# Patient Record
Sex: Female | Born: 1955 | Race: White | Hispanic: No | Marital: Married | Smoking: Former smoker
Health system: Southern US, Community
[De-identification: ages and names within clinical notes are randomized; demographics above are authoritative.]

## PROBLEM LIST (undated history)

## (undated) DIAGNOSIS — F329 Major depressive disorder, single episode, unspecified: Secondary | ICD-10-CM

## (undated) DIAGNOSIS — J449 Chronic obstructive pulmonary disease, unspecified: Secondary | ICD-10-CM

## (undated) DIAGNOSIS — C801 Malignant (primary) neoplasm, unspecified: Secondary | ICD-10-CM

## (undated) DIAGNOSIS — M1990 Unspecified osteoarthritis, unspecified site: Secondary | ICD-10-CM

## (undated) DIAGNOSIS — T7840XA Allergy, unspecified, initial encounter: Secondary | ICD-10-CM

## (undated) DIAGNOSIS — F419 Anxiety disorder, unspecified: Secondary | ICD-10-CM

## (undated) DIAGNOSIS — E119 Type 2 diabetes mellitus without complications: Secondary | ICD-10-CM

## (undated) DIAGNOSIS — I509 Heart failure, unspecified: Secondary | ICD-10-CM

## (undated) DIAGNOSIS — F32A Depression, unspecified: Secondary | ICD-10-CM

## (undated) DIAGNOSIS — I10 Essential (primary) hypertension: Secondary | ICD-10-CM

## (undated) HISTORY — PX: TUBAL LIGATION: SHX77

## (undated) HISTORY — DX: Essential (primary) hypertension: I10

## (undated) HISTORY — DX: Depression, unspecified: F32.A

## (undated) HISTORY — DX: Unspecified osteoarthritis, unspecified site: M19.90

## (undated) HISTORY — DX: Heart failure, unspecified: I50.9

## (undated) HISTORY — DX: Allergy, unspecified, initial encounter: T78.40XA

## (undated) HISTORY — DX: Anxiety disorder, unspecified: F41.9

## (undated) HISTORY — PX: MASTOIDECTOMY: SHX711

## (undated) HISTORY — DX: Major depressive disorder, single episode, unspecified: F32.9

## (undated) HISTORY — DX: Type 2 diabetes mellitus without complications: E11.9

## (undated) HISTORY — PX: TONSILLECTOMY: SUR1361

---

## 1999-12-05 ENCOUNTER — Other Ambulatory Visit: Admission: RE | Admit: 1999-12-05 | Discharge: 1999-12-05 | Payer: Self-pay | Admitting: Internal Medicine

## 1999-12-06 ENCOUNTER — Ambulatory Visit (HOSPITAL_COMMUNITY): Admission: RE | Admit: 1999-12-06 | Discharge: 1999-12-06 | Payer: Self-pay | Admitting: Gastroenterology

## 1999-12-11 ENCOUNTER — Ambulatory Visit (HOSPITAL_COMMUNITY): Admission: RE | Admit: 1999-12-11 | Discharge: 1999-12-11 | Payer: Self-pay | Admitting: Internal Medicine

## 1999-12-11 ENCOUNTER — Encounter: Payer: Self-pay | Admitting: Internal Medicine

## 1999-12-14 ENCOUNTER — Encounter: Payer: Self-pay | Admitting: Internal Medicine

## 1999-12-14 ENCOUNTER — Encounter: Admission: RE | Admit: 1999-12-14 | Discharge: 1999-12-14 | Payer: Self-pay | Admitting: Internal Medicine

## 2000-04-30 ENCOUNTER — Emergency Department (HOSPITAL_COMMUNITY): Admission: EM | Admit: 2000-04-30 | Discharge: 2000-04-30 | Payer: Self-pay | Admitting: Emergency Medicine

## 2000-06-10 ENCOUNTER — Inpatient Hospital Stay (HOSPITAL_COMMUNITY): Admission: AD | Admit: 2000-06-10 | Discharge: 2000-06-11 | Payer: Self-pay | Admitting: Internal Medicine

## 2000-06-10 ENCOUNTER — Encounter: Payer: Self-pay | Admitting: Internal Medicine

## 2000-08-27 ENCOUNTER — Ambulatory Visit (HOSPITAL_COMMUNITY): Admission: RE | Admit: 2000-08-27 | Discharge: 2000-08-27 | Payer: Self-pay | Admitting: Internal Medicine

## 2001-01-08 ENCOUNTER — Other Ambulatory Visit: Admission: RE | Admit: 2001-01-08 | Discharge: 2001-01-08 | Payer: Self-pay | Admitting: Internal Medicine

## 2002-01-14 ENCOUNTER — Encounter (HOSPITAL_BASED_OUTPATIENT_CLINIC_OR_DEPARTMENT_OTHER): Payer: Self-pay | Admitting: General Surgery

## 2002-01-19 ENCOUNTER — Encounter (INDEPENDENT_AMBULATORY_CARE_PROVIDER_SITE_OTHER): Payer: Self-pay | Admitting: *Deleted

## 2002-01-19 ENCOUNTER — Ambulatory Visit (HOSPITAL_COMMUNITY): Admission: RE | Admit: 2002-01-19 | Discharge: 2002-01-20 | Payer: Self-pay | Admitting: General Surgery

## 2002-02-24 ENCOUNTER — Encounter: Payer: Self-pay | Admitting: Internal Medicine

## 2002-02-24 ENCOUNTER — Encounter: Admission: RE | Admit: 2002-02-24 | Discharge: 2002-02-24 | Payer: Self-pay | Admitting: Internal Medicine

## 2002-05-13 ENCOUNTER — Encounter: Admission: RE | Admit: 2002-05-13 | Discharge: 2002-05-13 | Payer: Self-pay | Admitting: Internal Medicine

## 2002-05-13 ENCOUNTER — Encounter: Payer: Self-pay | Admitting: Internal Medicine

## 2002-10-03 ENCOUNTER — Emergency Department (HOSPITAL_COMMUNITY): Admission: EM | Admit: 2002-10-03 | Discharge: 2002-10-03 | Payer: Self-pay | Admitting: Emergency Medicine

## 2002-10-22 ENCOUNTER — Encounter: Payer: Self-pay | Admitting: Internal Medicine

## 2002-10-22 ENCOUNTER — Encounter: Admission: RE | Admit: 2002-10-22 | Discharge: 2002-10-22 | Payer: Self-pay | Admitting: Internal Medicine

## 2003-10-23 ENCOUNTER — Emergency Department (HOSPITAL_COMMUNITY): Admission: AD | Admit: 2003-10-23 | Discharge: 2003-10-23 | Payer: Self-pay | Admitting: Family Medicine

## 2005-06-14 ENCOUNTER — Emergency Department (HOSPITAL_COMMUNITY): Admission: EM | Admit: 2005-06-14 | Discharge: 2005-06-14 | Payer: Self-pay | Admitting: Family Medicine

## 2006-05-15 ENCOUNTER — Emergency Department (HOSPITAL_COMMUNITY): Admission: EM | Admit: 2006-05-15 | Discharge: 2006-05-15 | Payer: Self-pay | Admitting: Family Medicine

## 2006-09-10 ENCOUNTER — Emergency Department (HOSPITAL_COMMUNITY): Admission: EM | Admit: 2006-09-10 | Discharge: 2006-09-10 | Payer: Self-pay | Admitting: Emergency Medicine

## 2006-09-10 IMAGING — CR DG CHEST 2V
2 series · 2 of 2 positions shown · non-contrast
Comparison: [DATE].

CLINICAL DATA: Cough.
 CHEST - 2 VIEW:

[view not recorded (1 of 2)]
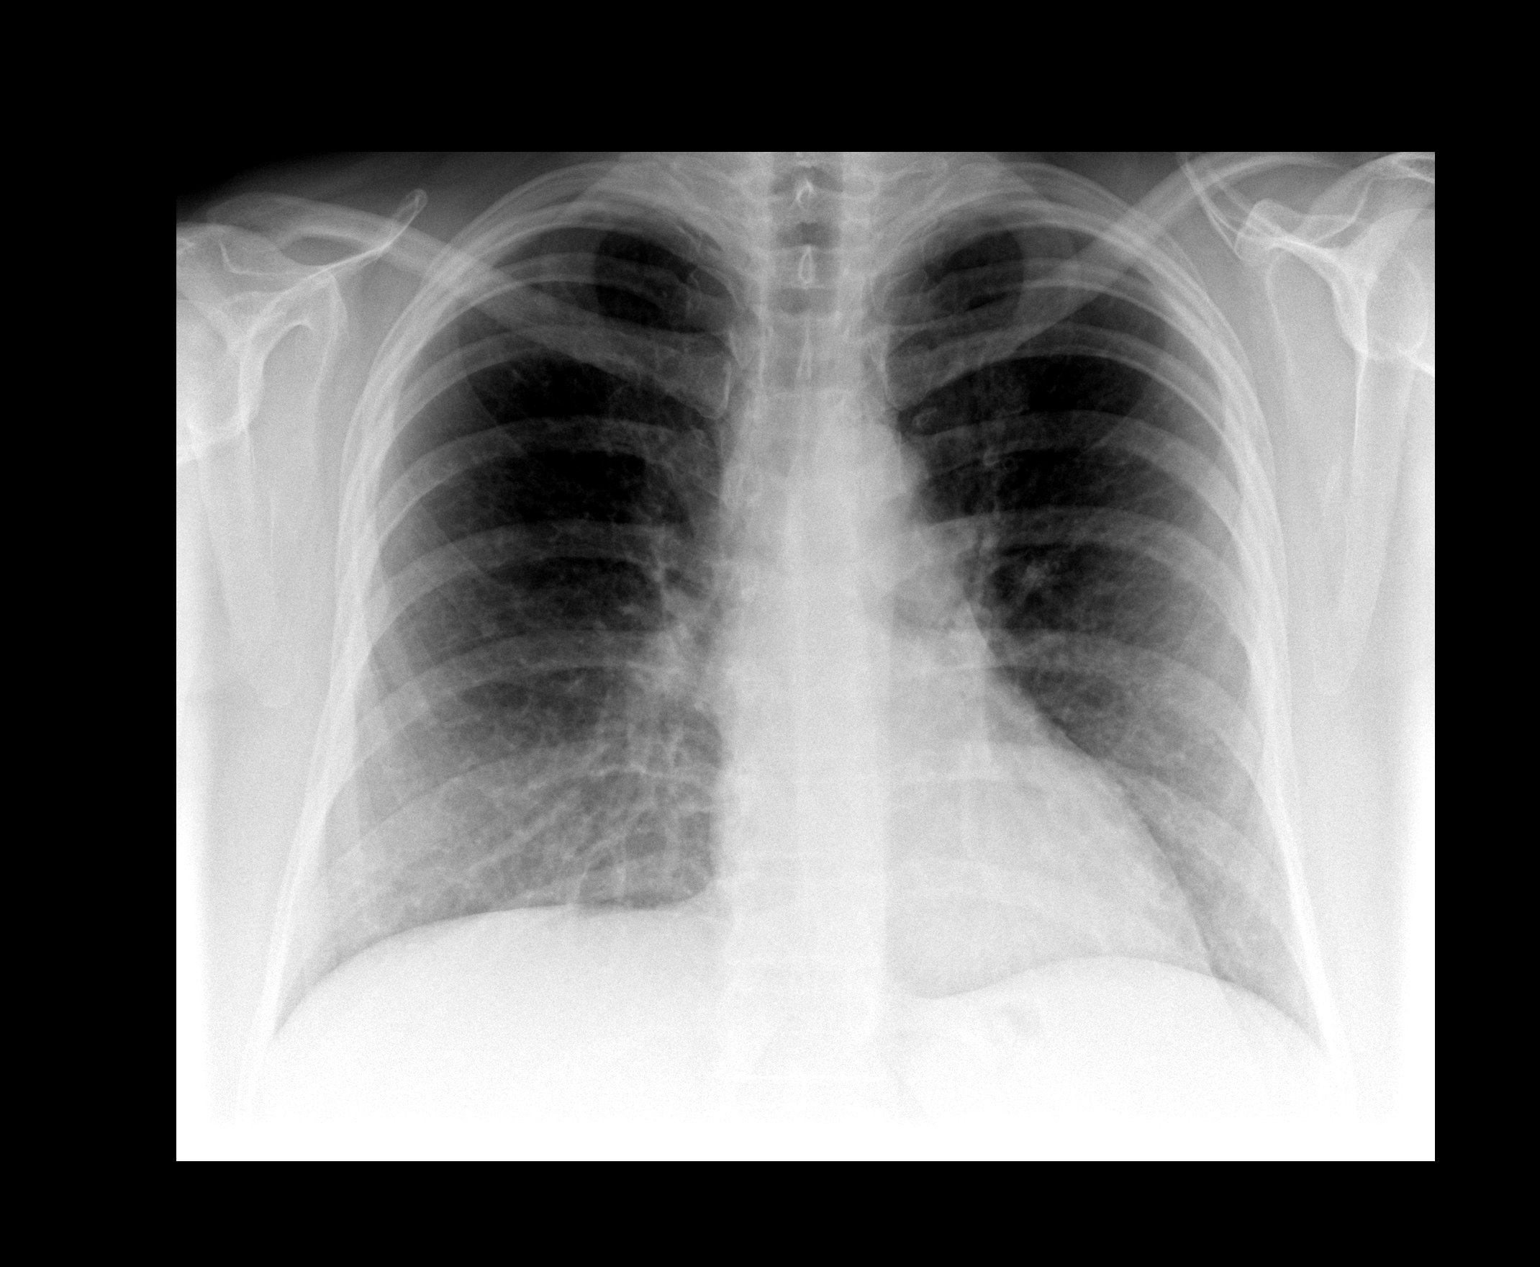

[view not recorded (2 of 2)]
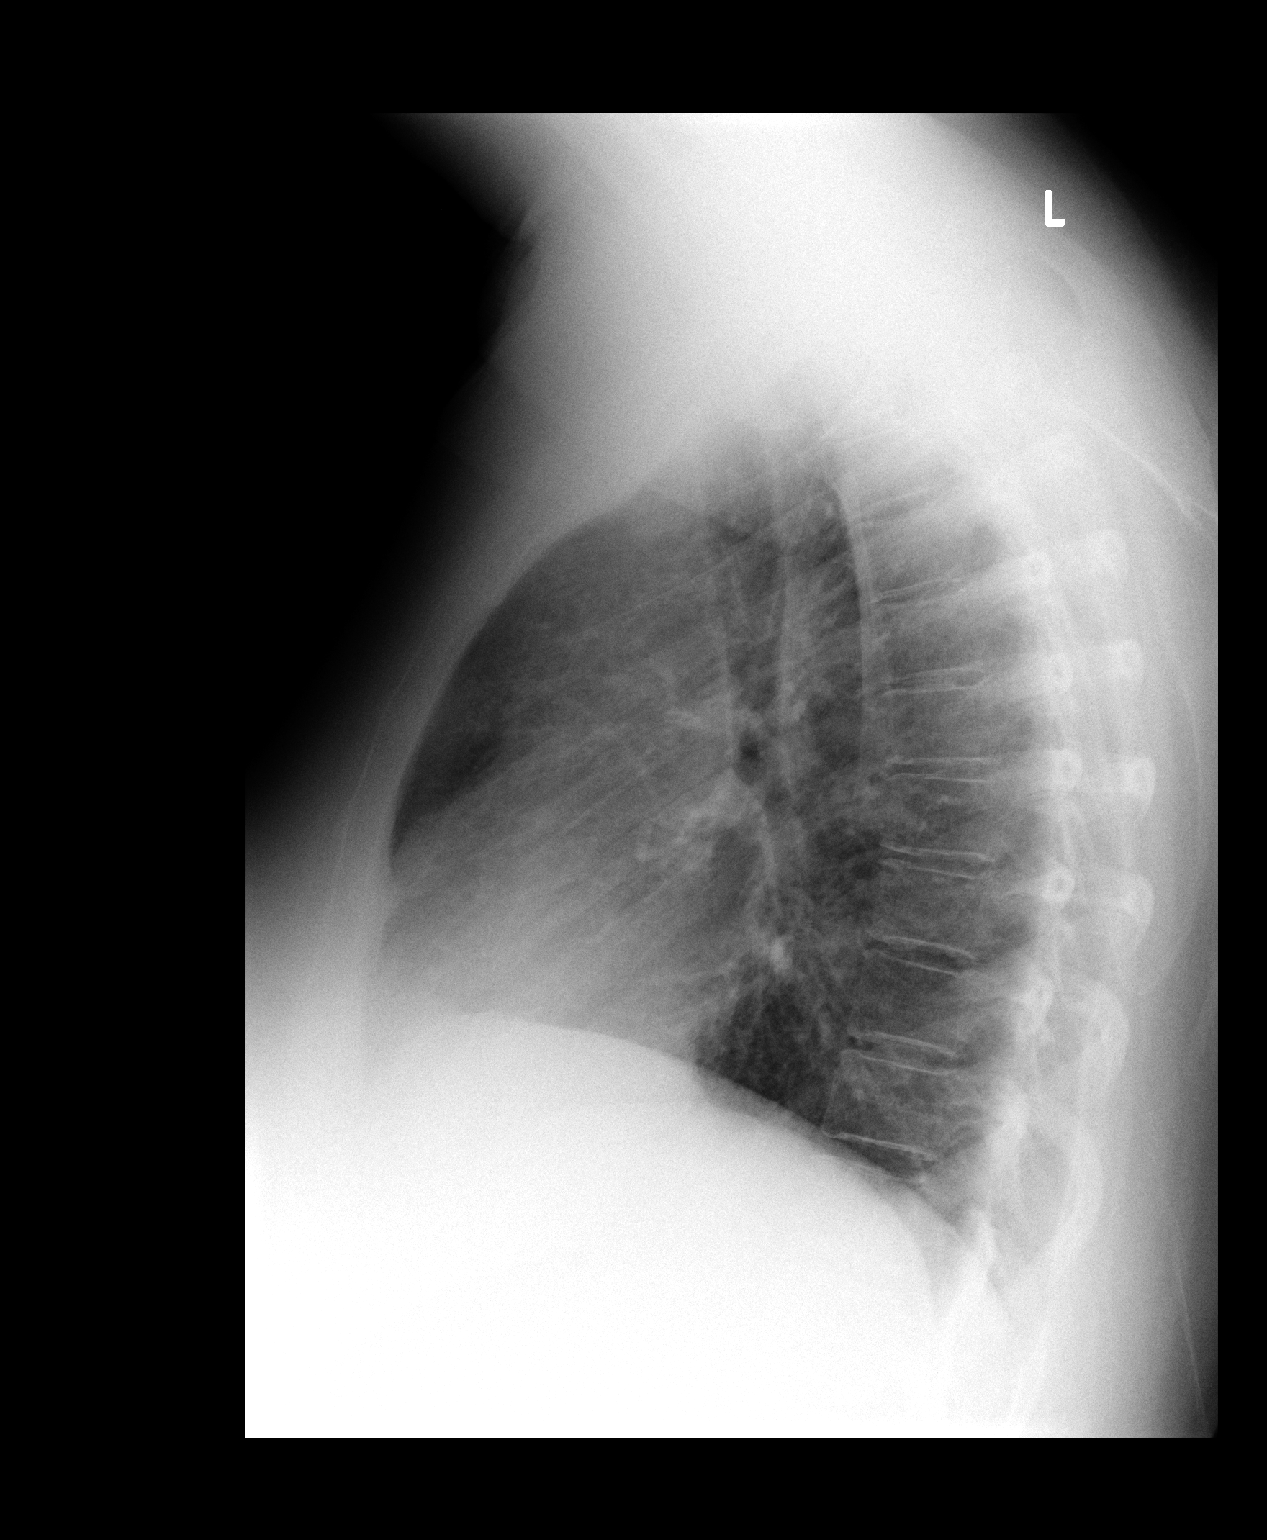

[2 of 2 positions shown; findings below may reference images not displayed]

FINDINGS: The lungs are clear.  Cardiac and mediastinal contours are normal.  Osseous structures are unremarkable.
IMPRESSION: Negative for acute cardiopulmonary process.

## 2007-04-23 ENCOUNTER — Emergency Department (HOSPITAL_COMMUNITY): Admission: EM | Admit: 2007-04-23 | Discharge: 2007-04-23 | Payer: Self-pay | Admitting: Emergency Medicine

## 2007-04-23 IMAGING — CR DG LUMBAR SPINE COMPLETE 4+V
5 series · 5 of 5 positions shown · non-contrast
Comparison: none

CLINICAL DATA: Fell three weeks ago.  Pain upper lumbar region radiating to right hip. 
 LUMBAR SPINE ? 5 VIEW:

[view not recorded (1 of 5)]
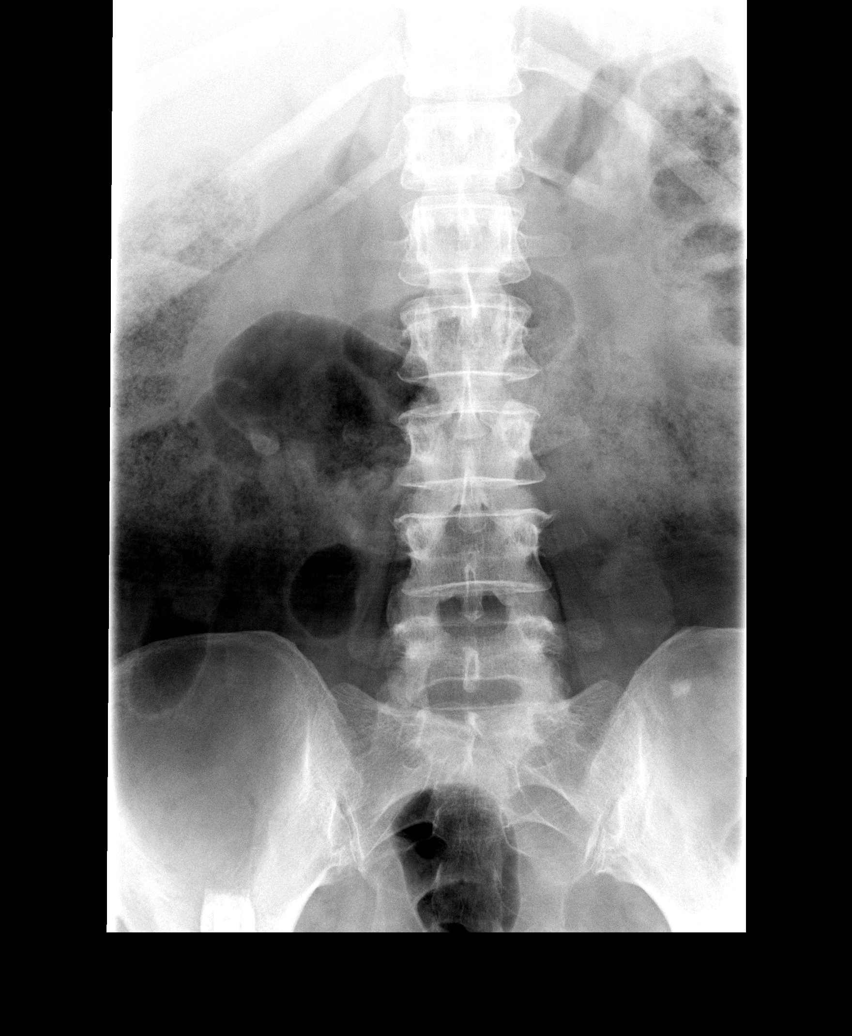

[view not recorded (2 of 5)]
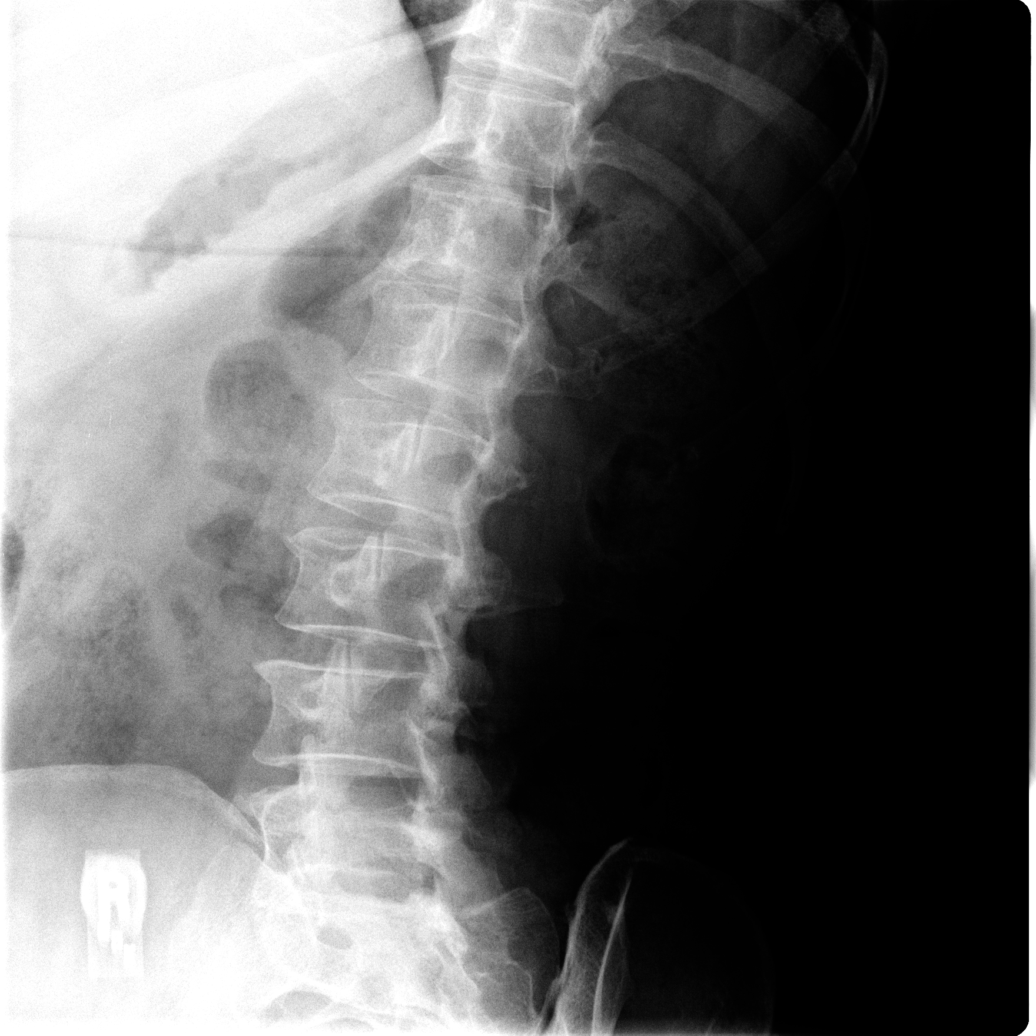

[view not recorded (3 of 5)]
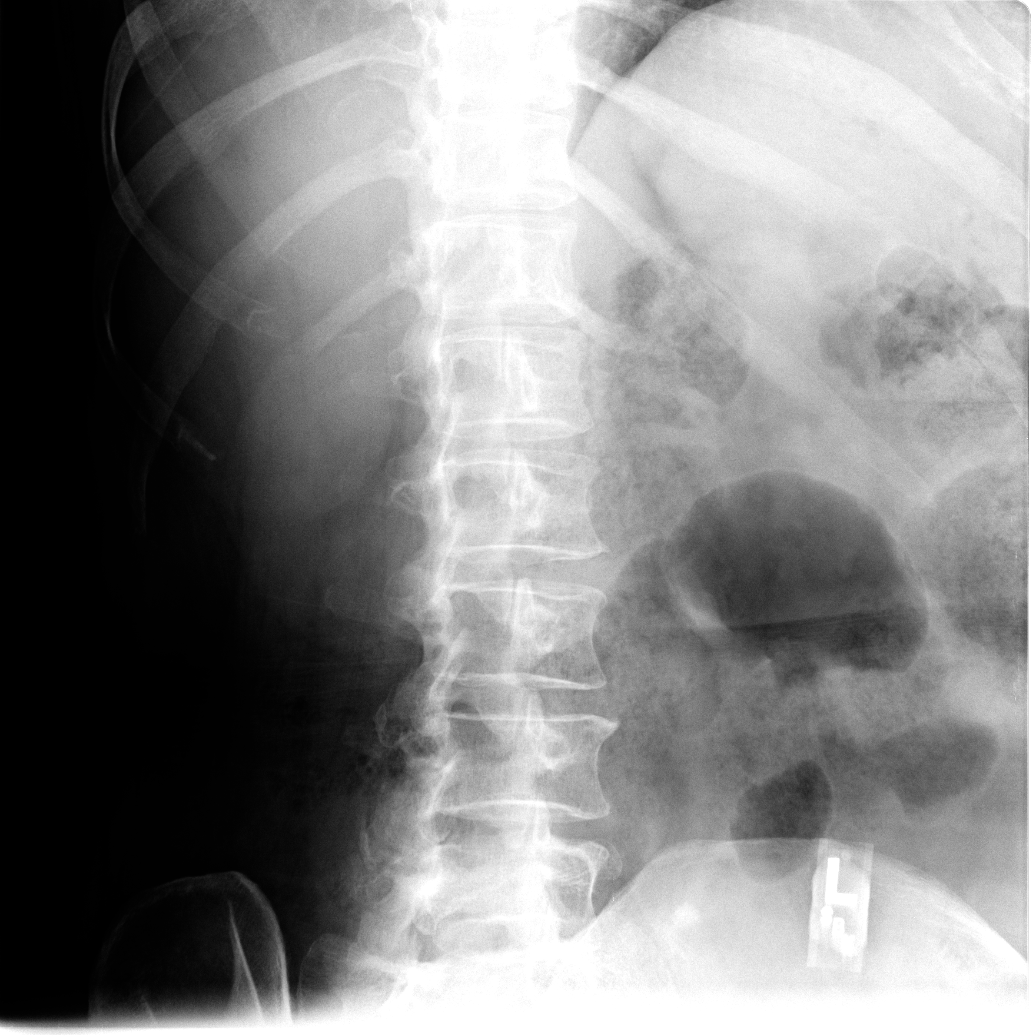

[view not recorded (4 of 5)]
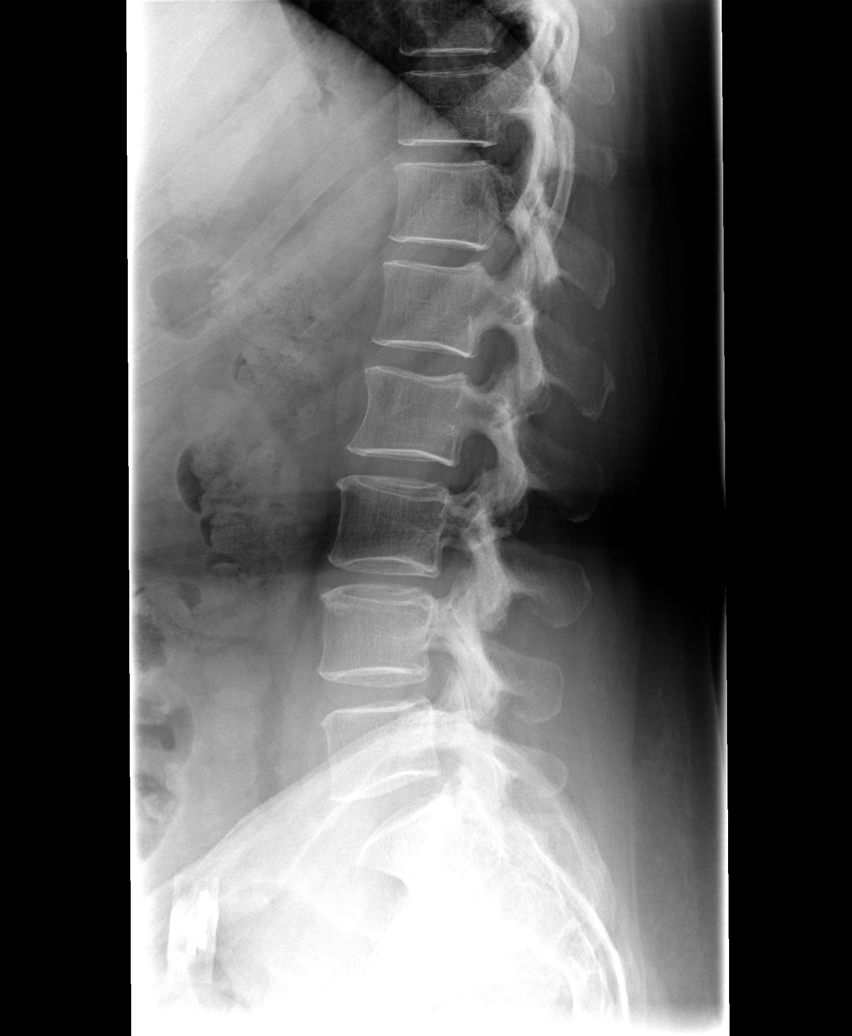

[view not recorded (5 of 5)]
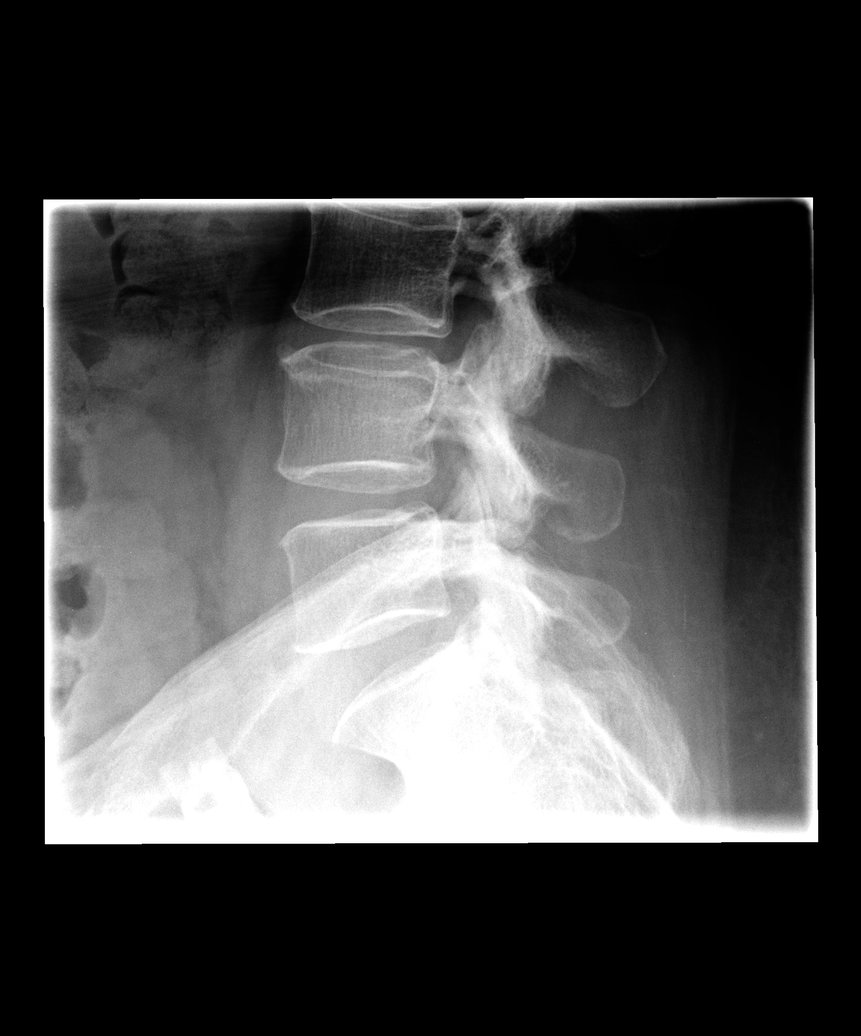

[5 of 5 positions shown; findings below may reference images not displayed]

FINDINGS: There is no evidence of lumbar spine fracture.  Alignment is normal.  Intervertebral disc spaces are maintained, and no other significant bone abnormalities are identified.  Probable sclerotic bone island medial aspect of the left iliac bone.   Generous amount of stool in the colon.
IMPRESSION: No acute lumbar spine abnormality.

## 2008-04-27 ENCOUNTER — Emergency Department (HOSPITAL_COMMUNITY): Admission: EM | Admit: 2008-04-27 | Discharge: 2008-04-27 | Payer: Self-pay | Admitting: Family Medicine

## 2010-11-16 ENCOUNTER — Other Ambulatory Visit: Payer: Self-pay | Admitting: Internal Medicine

## 2010-11-16 DIAGNOSIS — Z1231 Encounter for screening mammogram for malignant neoplasm of breast: Secondary | ICD-10-CM

## 2010-12-07 NOTE — Op Note (Signed)
[facility]  Patient:    Theresa Fernandez, Theresa Fernandez                      MRN: [id number] Proc. Date: 12/06/99 Adm. Date:  [id number] Disc. Date: [id number] Attending:  Charna Elizabeth CC:         Velna Hatchet, M.D.                           Operative Report   REFERRING PHYSICIAN:  Velna Hatchet, M.D.  PROCEDURE PERFORMED:  Colonoscopy and endoscopy.  ENDOSCOPIST:  Anselmo Rod, M.D.  INSTRUMENT USED:  Olympus video colonoscope.  INDICATION FOR PROCEDURE:  Rectal bleeding in a 55 year old white female. Rule out polyps, malformations, inflammatory bowel disease. etc.  PREPROCEDURE PREPARATION:  Informed consent was procured from the patient. The patient was fasted for eight hours prior to the procedure and prepped with a bottle of magnesium citrate and a gallon of NuLytely on the night prior to the procedure.  PREPROCEDURE PHYSICAL:  VITAL SIGNS:  The patient had stable vital signs.  NECK:  Supple.  LUNGS:  Chest was clear to auscultation.  CARDIAC:  S1, S2 is regular.  ABDOMEN:  Soft with normal abdominal bowel sounds.  DESCRIPTION OF PROCEDURE:  The patient was placed in the left lateral decubitus position.  Sedated with 50 mg of Demerol and 5 mg of Versed intravenously.  Once the patient was adequately sedated, maintained on low-flow oxygen and continuous cardiac monitoring,  the Olympus video colonoscope was advanced from the rectum to cecum without difficulty.  Except for small internal hemorrhoids seen on retroflexion in the rectum,  no other abnormalities were seen.  The patient had a tortuous colon.  No masses, polyps, erosions or ulcerations were identified.  IMPRESSION: 1. Essentially unrevealing colonoscopy procedure, viewed up to the    cecum. 2. Small nonbleeding internal hemorrhoids, seen on retroflexion in the rectum.  RECOMMENDATIONS:  The patient has been advised to increase fluids and fiber in the diet.  She is to follow  up in the office on an outpatient basis. DD:  12/06/99 TD:  12/11/99 Job: [id number]

## 2010-12-07 NOTE — Op Note (Signed)
. [facility]  Patient:    Theresa Fernandez, Theresa Fernandez Visit Number: [id number] MRN: [id number]          Service Type: Attending:  Luisa Hart L. Lurene Shadow, M.D. Dictated by:   Mardene Celeste. Lurene Shadow, M.D. Proc. Date: 01/19/02   CC:         Dondra Spry   Operative Report  PREOPERATIVE DIAGNOSIS:  Hemorrhoidal disease.  POSTOPERATIVE DIAGNOSIS:  Hemorrhoidal disease.  OPERATION PERFORMED:  Examination under anesthesia, proctosigmoidoscopy to 25 cm and hemorrhoidectomy.  SURGEON:  Mardene Celeste. Lurene Shadow, M.D.  ASSISTANT:  Nurse.  ANESTHESIA:  General.  INDICATIONS FOR PROCEDURE:  The patient is a 55 year old woman presenting with stage III hemorrhoidal disease with recurrent bleeding and pain, unresponsive to the usual topical medications.  She comes now for hemorrhoidectomy after the risks and potential benefits of surgery have been fully discussed, all question answered and consent obtained.  DESCRIPTION OF PROCEDURE:  Following the induction of satisfactory general anesthesia with the patient positioned in prone jackknife position, I inserted a proctosigmoidoscope and advanced it to 25 cm.  There was a very small adenomatous polyp noted at approximately 20 cm.  There were a few scattered diverticula noted, otherwise the entire examination was normal.  The scope was then removed.  The perianal tissues prepped and draped to be included in the sterile operative field.  Hemorrhoids located at the 2 oclock, 10 oclock and 6 oclock position.  They were all approached in the same manner by first infiltrating the hemorrhoidal tissues with 0.5% Marcaine with 1:200,000 epinephrine.  A stitch was placed at the base of the hemorrhoid with a 2-0 chromic.  An elliptical incision was made around the hemorrhoid and the hemorrhoid dissected free from the sphincter muscles and removed in its entirety.  Hemostasis obtained with electrocautery and the incision closed with a running suture of  2-0 chromic catgut reapproximating the mucosa and mucocutaneous junction.  Hemorrhoid at 6:00 was first approached in this manner and then the hemorrhoids at 10 oclock and at 2 oclock, also similarly approached.  At the end of the procedure, all areas of dissection were checked for hemostasis and additional suture was placed in the hemorrhoid at the 10 oclock position to stop some small bleeding at the edge of the incision. Sponge, instrument and sharp counts were verified.  Gelfoam patches were placed over each of the incisions.  Dressing was applied.  Anesthetic reversed.  Patient removed from the operating room to the recovery room in stable condition having tolerated the procedure well. Dictated by:   Mardene Celeste. Lurene Shadow, M.D. Attending:  Mardene Celeste. Lurene Shadow, M.D. DD:  01/19/02 TD:  01/20/02 Job: [id number]

## 2011-02-28 ENCOUNTER — Ambulatory Visit
Admission: RE | Admit: 2011-02-28 | Discharge: 2011-02-28 | Disposition: A | Discharge status: Home / Self Care | Source: Ambulatory Visit | Attending: Internal Medicine | Admitting: Internal Medicine

## 2011-02-28 DIAGNOSIS — Z1231 Encounter for screening mammogram for malignant neoplasm of breast: Secondary | ICD-10-CM

## 2012-09-28 ENCOUNTER — Other Ambulatory Visit: Payer: Self-pay | Admitting: Family

## 2012-09-28 ENCOUNTER — Ambulatory Visit
Admission: RE | Admit: 2012-09-28 | Discharge: 2012-09-28 | Disposition: A | Discharge status: Home / Self Care | Source: Ambulatory Visit | Attending: Internal Medicine | Admitting: Internal Medicine

## 2012-09-28 DIAGNOSIS — R05 Cough: Secondary | ICD-10-CM

## 2012-09-28 IMAGING — CR DG CHEST 2V
2 series · 2 of 2 positions shown · non-contrast
Comparison: [DATE].

CLINICAL DATA: Cough and congestion.

CHEST - 2 VIEW

[view not recorded (1 of 2)]
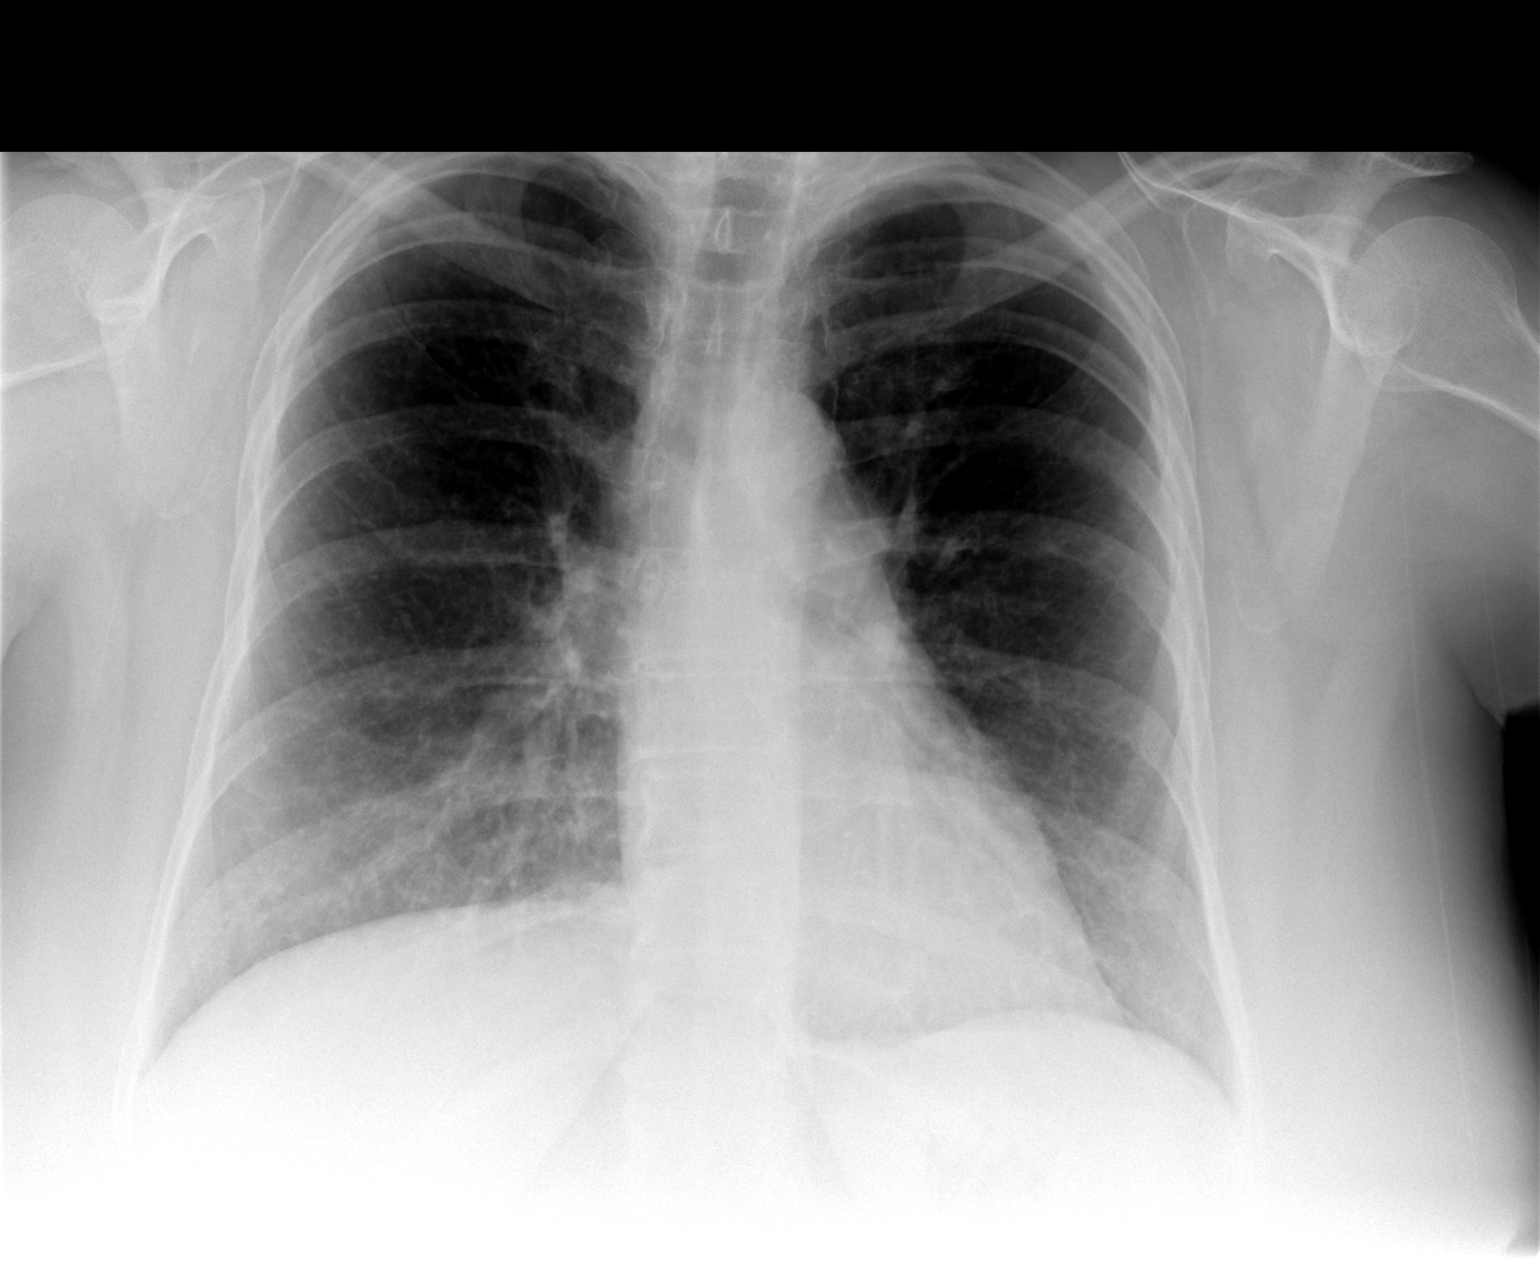

[view not recorded (2 of 2)]
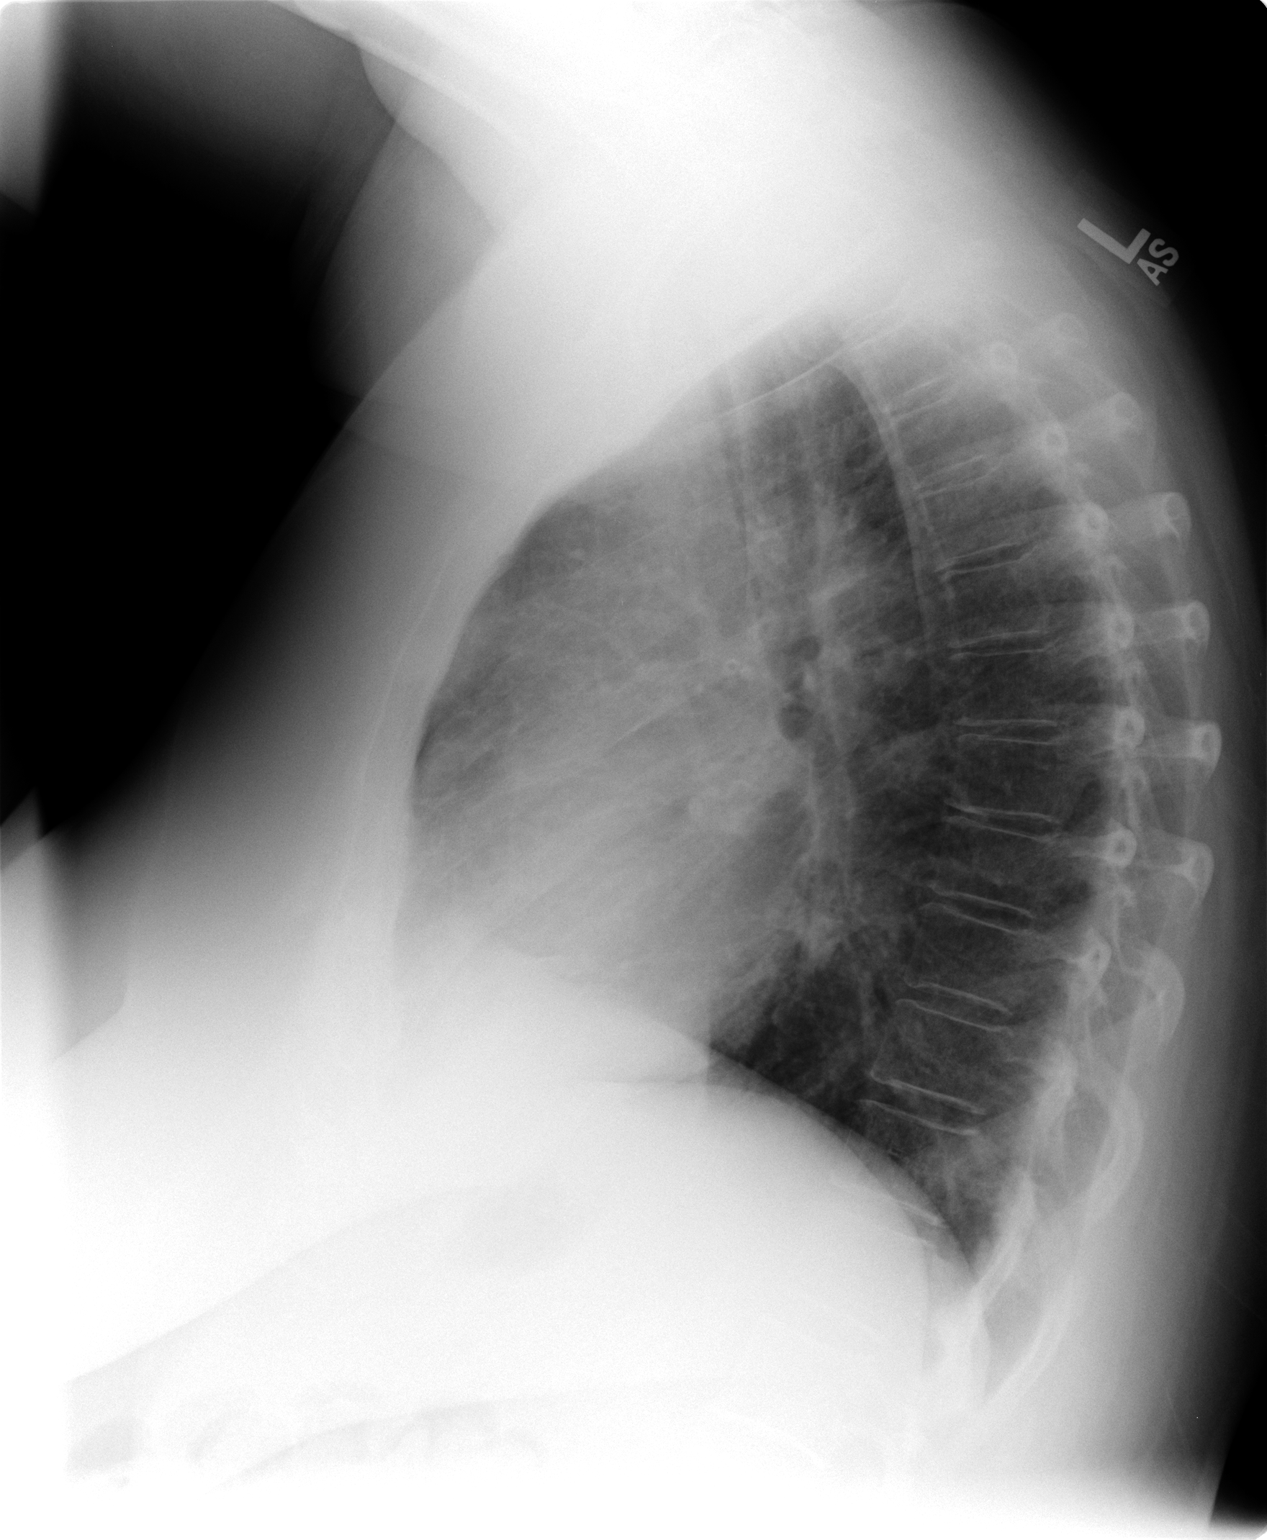

[2 of 2 positions shown; findings below may reference images not displayed]

FINDINGS: Trachea is midline.  Heart size normal.  Minimal biapical
pleural thickening.  Lungs are clear.  No pleural fluid.
IMPRESSION: No acute findings.

## 2013-01-12 ENCOUNTER — Ambulatory Visit: Attending: Internal Medicine | Admitting: Internal Medicine

## 2013-01-12 VITALS — BP 117/75 | HR 90 | Temp 98.1°F | Ht 70.0 in | Wt 249.2 lb

## 2013-01-12 DIAGNOSIS — I10 Essential (primary) hypertension: Secondary | ICD-10-CM

## 2013-01-12 DIAGNOSIS — F329 Major depressive disorder, single episode, unspecified: Secondary | ICD-10-CM

## 2013-01-12 HISTORY — DX: Essential (primary) hypertension: I10

## 2013-01-12 MED ORDER — ALPRAZOLAM 0.5 MG PO TABS: 0.5000 mg | ORAL_TABLET | Freq: Three times a day (TID) | ORAL | Status: DC | PRN

## 2013-01-12 MED ORDER — AMPHETAMINE-DEXTROAMPHETAMINE 20 MG PO TABS: 20.0000 mg | ORAL_TABLET | Freq: Three times a day (TID) | ORAL | Status: DC

## 2013-01-12 MED ORDER — HYDROCODONE-ACETAMINOPHEN 5-325 MG PO TABS: 1.0000 | ORAL_TABLET | Freq: Three times a day (TID) | ORAL | Status: DC | PRN

## 2013-01-12 MED ORDER — MAGNESIUM GLUCONATE 500 MG PO TABS: 500.0000 mg | ORAL_TABLET | Freq: Two times a day (BID) | ORAL | Status: DC

## 2013-01-12 MED ORDER — CYCLOBENZAPRINE HCL 10 MG PO TABS: 10.0000 mg | ORAL_TABLET | Freq: Three times a day (TID) | ORAL | Status: DC | PRN

## 2013-01-12 MED ORDER — TEMAZEPAM 15 MG PO CAPS: 15.0000 mg | ORAL_CAPSULE | Freq: Every evening | ORAL | Status: DC | PRN

## 2013-01-12 MED ORDER — SERTRALINE HCL 100 MG PO TABS: 100.0000 mg | ORAL_TABLET | Freq: Every day | ORAL | Status: DC

## 2013-01-12 MED ORDER — OMEPRAZOLE 20 MG PO CPDR: 20.0000 mg | DELAYED_RELEASE_CAPSULE | Freq: Two times a day (BID) | ORAL | Status: DC

## 2013-01-12 MED ORDER — PIOGLITAZONE HCL 30 MG PO TABS: 30.0000 mg | ORAL_TABLET | Freq: Every day | ORAL | Status: DC

## 2013-01-12 MED ORDER — ATORVASTATIN CALCIUM 20 MG PO TABS: 20.0000 mg | ORAL_TABLET | Freq: Every day | ORAL | Status: DC

## 2013-01-12 MED ORDER — PREGABALIN 150 MG PO CAPS: 150.0000 mg | ORAL_CAPSULE | Freq: Every day | ORAL | Status: DC

## 2013-01-12 MED ORDER — NAPROXEN SODIUM 220 MG PO TABS: 440.0000 mg | ORAL_TABLET | Freq: Two times a day (BID) | ORAL | Status: DC

## 2013-01-12 NOTE — Progress Notes (Signed)
Patient ID: Theresa Fernandez, female   DOB: 07-05-1956, 57 y.o.   MRN: [id number]  CC: wants to establish new provider   HPI: Patient is 57 year old female with history of depression, diabetes mellitus, hypertension who presents to clinic trying to establish new provider. She denies chest pain or shortness of breath, no recent sicknesses or hospitalizations, no specific urinary or abdominal concerns. She has multiple medicines but does not have a list with her available and she is planning on bringing a list of medicines with her so that we can provide refills for her. She also explains she has had recent blood test done but is not sure what exactly was checked. She will check with her previous provider to fax the records or clinic.  Allergies  Allergen Reactions  . Topamax (Topiramate)    Past Medical History  Diagnosis Date  . Allergy   . Anxiety   . Arthritis   . Depression   . Diabetes mellitus without complication    No current outpatient prescriptions on file prior to visit.   No current facility-administered medications on file prior to visit. Pt will bring in the list of medications with her so that we can enter the medications into our system.   Family History  Problem Relation Age of Onset  . Diabetes Mother   . Alcohol abuse Father   . Diabetes Father    History   Social History  . Marital Status: Married    Spouse Name: N/A    Number of Children: N/A  . Years of Education: N/A   Occupational History  . Not on file.   Social History Main Topics  . Smoking status: Current Every Day Smoker -- 1.00 packs/day    Types: Cigarettes  . Smokeless tobacco: Not on file  . Alcohol Use: Not on file  . Drug Use: Not on file  . Sexually Active: Not on file   Other Topics Concern  . Not on file   Social History Narrative  . No narrative on file    Review of Systems  Constitutional: Negative for fever, chills, diaphoresis, activity change, appetite change and fatigue.   HENT: Negative for ear pain, nosebleeds, congestion, facial swelling, rhinorrhea, neck pain, neck stiffness and ear discharge.   Eyes: Negative for pain, discharge, redness, itching and visual disturbance.  Respiratory: Negative for cough, choking, chest tightness, shortness of breath, wheezing and stridor.   Cardiovascular: Negative for chest pain, palpitations and leg swelling.  Gastrointestinal: Negative for abdominal distention.  Genitourinary: Negative for dysuria, urgency, frequency, hematuria, flank pain, decreased urine volume, difficulty urinating and dyspareunia.  Musculoskeletal: Negative for back pain, joint swelling, arthralgias and gait problem.  Neurological: Negative for dizziness, tremors, seizures, syncope, facial asymmetry, speech difficulty, weakness, light-headedness, numbness and headaches.  Hematological: Negative for adenopathy. Does not bruise/bleed easily.  Psychiatric/Behavioral: Negative for hallucinations, behavioral problems, confusion, dysphoric mood, decreased concentration and agitation.    Objective:   Filed Vitals:   01/12/13 1054  BP: 117/75  Pulse: 90  Temp: 98.1 F (36.7 C)    Physical Exam  Constitutional: Appears well-developed and well-nourished. No distress.  CVS: RRR, S1/S2 +, no murmurs, no gallops, no carotid bruit.  Pulmonary: Effort and breath sounds normal, no stridor, rhonchi, wheezes, rales.  Abdominal: Soft. BS +,  no distension, tenderness, rebound or guarding.  Psychiatric: Normal mood and affect. Behavior, judgment, thought content normal.   No results found for this basename: WBC, HGB, HCT, MCV, PLT   No results found  for this basename: CREATININE, BUN, NA, K, CL, CO2    No results found for this basename: HGBA1C   Lipid Panel  No results found for this basename: chol, trig, hdl, cholhdl, vldl, ldlcalc       Assessment and plan:   Establishing new care provider - pt is her to establish the care and needs to get  refills on medications - reasonable BP on this visit - pt advised to continue checking BP regularly and to call us back if the numbers are higher > 140/90 - will await for PCP records so that we know which blood tests to obtain, will likely need lipid panel, A1C, BMET

## 2013-01-12 NOTE — Patient Instructions (Signed)

## 2013-02-11 ENCOUNTER — Ambulatory Visit: Attending: Family Medicine | Admitting: Family Medicine

## 2013-02-11 VITALS — BP 137/83 | HR 91 | Temp 98.1°F | Resp 20 | Ht 70.0 in | Wt 265.0 lb

## 2013-02-11 DIAGNOSIS — R252 Cramp and spasm: Secondary | ICD-10-CM

## 2013-02-11 DIAGNOSIS — I10 Essential (primary) hypertension: Secondary | ICD-10-CM

## 2013-02-11 DIAGNOSIS — F17200 Nicotine dependence, unspecified, uncomplicated: Secondary | ICD-10-CM | POA: Insufficient documentation

## 2013-02-11 DIAGNOSIS — M545 Low back pain, unspecified: Secondary | ICD-10-CM

## 2013-02-11 DIAGNOSIS — G8929 Other chronic pain: Secondary | ICD-10-CM | POA: Insufficient documentation

## 2013-02-11 DIAGNOSIS — F32A Depression, unspecified: Secondary | ICD-10-CM

## 2013-02-11 DIAGNOSIS — R5383 Other fatigue: Secondary | ICD-10-CM

## 2013-02-11 DIAGNOSIS — E785 Hyperlipidemia, unspecified: Secondary | ICD-10-CM

## 2013-02-11 DIAGNOSIS — Z0000 Encounter for general adult medical examination without abnormal findings: Secondary | ICD-10-CM

## 2013-02-11 DIAGNOSIS — R5381 Other malaise: Secondary | ICD-10-CM

## 2013-02-11 DIAGNOSIS — E119 Type 2 diabetes mellitus without complications: Secondary | ICD-10-CM

## 2013-02-11 DIAGNOSIS — F329 Major depressive disorder, single episode, unspecified: Secondary | ICD-10-CM

## 2013-02-11 LAB — CBC
Hemoglobin: 13.1 g/dL (ref 12.0–15.0)
MCH: 30.1 pg (ref 26.0–34.0)
MCHC: 33 g/dL (ref 30.0–36.0)
WBC: 10.4 10*3/uL (ref 4.0–10.5)

## 2013-02-11 MED ORDER — PIOGLITAZONE HCL 30 MG PO TABS: 30.0000 mg | ORAL_TABLET | Freq: Every day | ORAL | Status: DC

## 2013-02-11 MED ORDER — ATORVASTATIN CALCIUM 20 MG PO TABS: 20.0000 mg | ORAL_TABLET | Freq: Every day | ORAL | Status: DC

## 2013-02-11 MED ORDER — PREGABALIN 150 MG PO CAPS: 150.0000 mg | ORAL_CAPSULE | Freq: Every day | ORAL | Status: DC

## 2013-02-11 MED ORDER — HYDROCODONE-ACETAMINOPHEN 5-325 MG PO TABS: 1.0000 | ORAL_TABLET | Freq: Three times a day (TID) | ORAL | Status: DC | PRN

## 2013-02-11 NOTE — Patient Instructions (Addendum)
Blood Sugar Monitoring, Adult GLUCOSE METERS FOR SELF-MONITORING OF BLOOD GLUCOSE  It is important to be able to correctly measure your blood sugar (glucose). You can use a blood glucose monitor (a small battery-operated device) to check your glucose level at any time. This allows you and your caregiver to monitor your diabetes and to determine how well your treatment plan is working. The process of monitoring your blood glucose with a glucose meter is called self-monitoring of blood glucose (SMBG). When people with diabetes control their blood sugar, they have better health. To test for glucose with a typical glucose meter, place the disposable strip in the meter. Then place a small sample of blood on the "test strip." The test strip is coated with chemicals that combine with glucose in blood. The meter measures how much glucose is present. The meter displays the glucose level as a number. Several new models can record and store a number of test results. Some models can connect to personal computers to store test results or print them out.  Newer meters are often easier to use than older models. Some meters allow you to get blood from places other than your fingertip. Some new models have automatic timing, error codes, signals, or barcode readers to help with proper adjustment (calibration). Some meters have a large display screen or spoken instructions for people with visual impairments.  INSTRUCTIONS FOR USING GLUCOSE METERS  Wash your hands with soap and warm water, or clean the area with alcohol. Dry your hands completely.  Prick the side of your fingertip with a lancet (a sharp-pointed tool used by hand).  Hold the hand down and gently milk the finger until a small drop of blood appears. Catch the blood with the test strip.  Follow the instructions for inserting the test strip and using the SMBG meter. Most meters require the meter to be turned on and the test strip to be inserted before applying  the blood sample.  Record the test result.  Read the instructions carefully for both the meter and the test strips that go with it. Meter instructions are found in the user manual. Keep this manual to help you solve any problems that may arise. Many meters use "error codes" when there is a problem with the meter, the test strip, or the blood sample on the strip. You will need the manual to understand these error codes and fix the problem.  New devices are available such as laser lancets and meters that can test blood taken from "alternative sites" of the body, other than fingertips. However, you should use standard fingertip testing if your glucose changes rapidly. Also, use standard testing if:  You have eaten, exercised, or taken insulin in the past 2 hours.  You think your glucose is low.  You tend to not feel symptoms of low blood glucose (hypoglycemia).  You are ill or under stress.  Clean the meter as directed by the manufacturer.  Test the meter for accuracy as directed by the manufacturer.  Take your meter with you to your caregiver's office. This way, you can test your glucose in front of your caregiver to make sure you are using the meter correctly. Your caregiver can also take a sample of blood to test using a routine lab method. If values on the glucose meter are close to the lab results, you and your caregiver will see that your meter is working well and you are using good technique. Your caregiver will advise you about what   to do if the results do not match. FREQUENCY OF TESTING  Your caregiver will tell you how often you should check your blood glucose. This will depend on your type of diabetes, your current level of diabetes control, and your types of medicines. The following are general guidelines, but your care plan may be different. Record all your readings and the time of day you took them for review with your caregiver.   Diabetes type 1.  When you are using insulin  with good diabetic control (either multiple daily injections or via a pump), you should check your glucose 4 times a day.  If your diabetes is not well controlled, you may need to monitor more frequently, including before meals and 2 hours after meals, at bedtime, and occasionally between 2 a.m. and 3 a.m.  You should always check your glucose before a dose of insulin or before changing the rate on your insulin pump.  Diabetes type 2.  Guidelines for SMBG in diabetes type 2 are not as well defined.  If you are on insulin, follow the guidelines above.  If you are on medicines, but not insulin, and your glucose is not well controlled, you should test at least twice daily.  If you are not on insulin, and your diabetes is controlled with medicines or diet alone, you should test at least once daily, usually before breakfast.  A weekly profile will help your caregiver advise you on your care plan. The week before your visit, check your glucose before a meal and 2 hours after a meal at least daily. You may want to test before and after a different meal each day so you and your caregiver can tell how well controlled your blood sugars are throughout the course of a 24 hour period.  Gestational diabetes (diabetes during pregnancy).  Frequent testing is often necessary. Accurate timing is important.  If you are not on insulin, check your glucose 4 times a day. Check it before breakfast and 1 hour after the start of each meal.  If you are on insulin, check your glucose 6 times a day. Check it before each meal and 1 hour after the first bite of each meal.  General guidelines.  More frequent testing is required at the start of insulin treatment. Your caregiver will instruct you.  Test your glucose any time you suspect you have low blood sugar (hypoglycemia).  You should test more often when you change medicines, when you have unusual stress or illness, or in other unusual circumstances. OTHER  THINGS TO KNOW ABOUT GLUCOSE METERS  Measurement Range. Most glucose meters are able to read glucose levels over a broad range of values from as low as 0 to as high as 600 mg/dL. If you get an extremely high or low reading from your meter, you should first confirm it with another reading. Report very high or very low readings to your caregiver.  Whole Blood Glucose versus Plasma Glucose. Some older home glucose meters measure glucose in your whole blood. In a lab or when using some newer home glucose meters, the glucose is measured in your plasma (one component of blood). The difference can be important. It is important for you and your caregiver to know whether your meter gives its results as "whole blood equivalent" or "plasma equivalent."  Display of High and Low Glucose Values. Part of learning how to operate a meter is understanding what the meter results mean. Know how high and low glucose concentrations are displayed  on your meter.  Factors that Affect Glucose Meter Performance. The accuracy of your test results depends on many factors and varies depending on the brand and type of meter. These factors include:  Low red blood cell count (anemia).  Substances in your blood (such as uric acid, vitamin C, and others).  Environmental factors (temperature, humidity, altitude).  Name-brand versus generic test strips.  Calibration. Make sure your meter is set up properly. It is a good idea to do a calibration test with a control solution recommended by the manufacturer of your meter whenever you begin using a fresh bottle of test strips. This will help verify the accuracy of your meter.  Improperly stored, expired, or defective test strips. Keep your strips in a dry place with the lid on.  Soiled meter.  Inadequate blood sample. NEW TECHNOLOGIES FOR GLUCOSE TESTING Alternative site testing Some glucose meters allow testing blood from alternative sites. These include the:  Upper  arm.  Forearm.  Base of the thumb.  Thigh. Sampling blood from alternative sites may be desirable. However, it may have some limitations. Blood in the fingertips show changes in glucose levels more quickly than blood in other parts of the body. This means that alternative site test results may be different from fingertip test results, not because of the meter's ability to test accurately, but because the actual glucose concentration can be different.  Continuous Glucose Monitoring Devices to measure your blood glucose continuously are available, and others are in development. These methods can be more expensive than self-monitoring with a glucose meter. However, it is uncertain how effective and reliable these devices are. Your caregiver will advise you if this approach makes sense for you. IF BLOOD SUGARS ARE CONTROLLED, PEOPLE WITH DIABETES REMAIN HEALTHIER.  SMBG is an important part of the treatment plan of patients with diabetes mellitus. Below are reasons for using SMBG:   It confirms that your glucose is at a specific, healthy level.  It detects hypoglycemia and severe hyperglycemia.  It allows you and your caregiver to make adjustments in response to changes in lifestyle for individuals requiring medicine.  It determines the need for starting insulin therapy in temporary diabetes that happens during pregnancy (gestational diabetes). Document Released: 07/11/2003 Document Revised: 09/30/2011 Document Reviewed: 11/01/2010 [facility] Patient Information 2014 [place], [place]. Back Pain, Adult Back pain is very common. The pain often gets better over time. The cause of back pain is usually not dangerous. Most people can learn to manage their back pain on their own.  HOME CARE   Stay active. Start with short walks on flat ground if you can. Try to walk farther each day.  Do not sit, drive, or stand in one place for more than 30 minutes. Do not stay in bed.  Do not avoid exercise or  work. Activity can help your back heal faster.  Be careful when you bend or lift an object. Bend at your knees, keep the object close to you, and do not twist.  Sleep on a firm mattress. Lie on your side, and bend your knees. If you lie on your back, put a pillow under your knees.  Only take medicines as told by your doctor.  Put ice on the injured area.  Put ice in a plastic bag.  Place a towel between your skin and the bag.  Leave the ice on for 15-20 minutes, 3-4 times a day for the first 2 to 3 days. After that, you can switch between ice and  heat packs.  Ask your doctor about back exercises or massage.  Avoid feeling anxious or stressed. Find good ways to deal with stress, such as exercise. GET HELP RIGHT AWAY IF:   Your pain does not go away with rest or medicine.  Your pain does not go away in 1 week.  You have new problems.  You do not feel well.  The pain spreads into your legs.  You cannot control when you poop (bowel movement) or pee (urinate).  Your arms or legs feel weak or lose feeling (numbness).  You feel sick to your stomach (nauseous) or throw up (vomit).  You have belly (abdominal) pain.  You feel like you may pass out (faint). MAKE SURE YOU:   Understand these instructions.  Will watch your condition.  Will get help right away if you are not doing well or get worse. Document Released: 12/25/2007 Document Revised: 09/30/2011 Document Reviewed: 11/26/2010 [facility] Patient Information 2014 [place], [place].  Smoking Cessation, Tips for Success YOU CAN QUIT SMOKING If you are ready to quit smoking, congratulations! You have chosen to help yourself be healthier. Cigarettes bring nicotine, tar, carbon monoxide, and other irritants into your body. Your lungs, heart, and blood vessels will be able to work better without these poisons. There are many different ways to quit smoking. Nicotine gum, nicotine patches, a nicotine inhaler, or nicotine nasal  spray can help with physical craving. Hypnosis, support groups, and medicines help break the habit of smoking. Here are some tips to help you quit for good.  Throw away all cigarettes.  Clean and remove all ashtrays from your home, work, and car.  On a card, write down your reasons for quitting. Carry the card with you and read it when you get the urge to smoke.  Cleanse your body of nicotine. Drink enough water and fluids to keep your urine clear or pale yellow. Do this after quitting to flush the nicotine from your body.  Learn to predict your moods. Do not let a bad situation be your excuse to have a cigarette. Some situations in your life might tempt you into wanting a cigarette.  Never have "just one" cigarette. It leads to wanting another and another. Remind yourself of your decision to quit.  Change habits associated with smoking. If you smoked while driving or when feeling stressed, try other activities to replace smoking. Stand up when drinking your coffee. Brush your teeth after eating. Sit in a different chair when you read the paper. Avoid alcohol while trying to quit, and try to drink fewer caffeinated beverages. Alcohol and caffeine may urge you to smoke.  Avoid foods and drinks that can trigger a desire to smoke, such as sugary or spicy foods and alcohol.  Ask people who smoke not to smoke around you.  Have something planned to do right after eating or having a cup of coffee. Take a walk or exercise to perk you up. This will help to keep you from overeating.  Try a relaxation exercise to calm you down and decrease your stress. Remember, you may be tense and nervous for the first 2 weeks after you quit, but this will pass.  Find new activities to keep your hands busy. Play with a pen, coin, or rubber band. Doodle or draw things on paper.  Brush your teeth right after eating. This will help cut down on the craving for the taste of tobacco after meals. You can try mouthwash,  too.  Use oral substitutes, such  as lemon drops, carrots, a cinnamon stick, or chewing gum, in place of cigarettes. Keep them handy so they are available when you have the urge to smoke.  When you have the urge to smoke, try deep breathing.  Designate your home as a nonsmoking area.  If you are a heavy smoker, ask your caregiver about a prescription for nicotine chewing gum. It can ease your withdrawal from nicotine.  Reward yourself. Set aside the cigarette money you save and buy yourself something nice.  Look for support from others. Join a support group or smoking cessation program. Ask someone at home or at work to help you with your plan to quit smoking.  Always ask yourself, "Do I need this cigarette or is this just a reflex?" Tell yourself, "Today, I choose not to smoke," or "I do not want to smoke." You are reminding yourself of your decision to quit, even if you do smoke a cigarette. HOW WILL I FEEL WHEN I QUIT SMOKING?  The benefits of not smoking start within days of quitting.  You may have symptoms of withdrawal because your body is used to nicotine (the addictive substance in cigarettes). You may crave cigarettes, be irritable, feel very hungry, cough often, get headaches, or have difficulty concentrating.  The withdrawal symptoms are only temporary. They are strongest when you first quit but will go away within 10 to 14 days.  When withdrawal symptoms occur, stay in control. Think about your reasons for quitting. Remind yourself that these are signs that your body is healing and getting used to being without cigarettes.  Remember that withdrawal symptoms are easier to treat than the major diseases that smoking can cause.  Even after the withdrawal is over, expect periodic urges to smoke. However, these cravings are generally short-lived and will go away whether you smoke or not. Do not smoke!  If you relapse and smoke again, do not lose hope. Most smokers quit 3 times  before they are successful.  If you relapse, do not give up! Plan ahead and think about what you will do the next time you get the urge to smoke. LIFE AS A NONSMOKER: MAKE IT FOR A MONTH, MAKE IT FOR LIFE Day 1: Hang this page where you will see it every day. Day 2: Get rid of all ashtrays, matches, and lighters. Day 3: Drink water. Breathe deeply between sips. Day 4: Avoid places with smoke-filled air, such as bars, clubs, or the smoking section of restaurants. Day 5: Keep track of how much money you save by not smoking. Day 6: Avoid boredom. Keep a good book with you or go to the movies. Day 7: Reward yourself! One week without smoking! Day 8: Make a dental appointment to get your teeth cleaned. Day 9: Decide how you will turn down a cigarette before it is offered to you. Day 10: Review your reasons for quitting. Day 11: Distract yourself. Stay active to keep your mind off smoking and to relieve tension. Take a walk, exercise, read a book, do a crossword puzzle, or try a new hobby. Day 12: Exercise. Get off the bus before your stop or use stairs instead of escalators. Day 13: Call on friends for support and encouragement. Day 14: Reward yourself! Two weeks without smoking! Day 15: Practice deep breathing exercises. Day 16: Bet a friend that you can stay a nonsmoker. Day 17: Ask to sit in nonsmoking sections of restaurants. Day 18: Hang up "No Smoking" signs. Day 19: Think of yourself  as a nonsmoker. Day 20: Each morning, tell yourself you will not smoke. Day 21: Reward yourself! Three weeks without smoking! Day 22: Think of smoking in negative ways. Remember how it stains your teeth, gives you bad breath, and leaves you short of breath. Day 23: Eat a nutritious breakfast. Day 24:Do not relive your days as a smoker. Day 25: Hold a pencil in your hand when talking on the telephone. Day 26: Tell all your friends you do not smoke. Day 27: Think about how much better food tastes. Day 28:  Remember, one cigarette is one too many. Day 29: Take up a hobby that will keep your hands busy. Day 30: Congratulations! One month without smoking! Give yourself a big reward. Your caregiver can direct you to community resources or hospitals for support, which may include:  Group support.  Education.  Hypnosis.  Subliminal therapy. Document Released: 04/05/2004 Document Revised: 09/30/2011 Document Reviewed: 04/24/2009 [facility] Patient Information 2014 [place], [place].

## 2013-02-11 NOTE — Progress Notes (Signed)
Patient ID: Theresa Fernandez, female   DOB: 1955-10-20, 57 y.o.   MRN: [id number]  CC: CPE  HPI: Pt is presenting today for a CPE.  Pt says she has been doing well.  Pt reports that she had a rash on her arms but it has resolved now.  Pt is requesting refills of her pain meds.  She has not been established with pain mgmt.  Pt says she is going to a psychiatrist for refills of her psychiatric medications.  Pt says she has been told that she needs no more pap smears by her gynecologist.  Pt had a colonoscopy 10 years ago but refuses to have another one because of severe complications from the first one.  Pt reports that she has been having muscle cramps in legs and some have been severe.   Allergies  Allergen Reactions  . Topamax (Topiramate)    Past Medical History  Diagnosis Date  . Allergy   . Anxiety   . Arthritis   . Depression   . Diabetes mellitus without complication   . HTN (hypertension) 01/12/2013   Current Outpatient Prescriptions on File Prior to Visit  Medication Sig Dispense Refill  . ALPRAZolam (XANAX) 0.5 MG tablet Take 1 tablet (0.5 mg total) by mouth 3 (three) times daily as needed for sleep.  45 tablet  0  . amphetamine-dextroamphetamine (ADDERALL) 20 MG tablet Take 1 tablet (20 mg total) by mouth 3 (three) times daily.  90 tablet  0  . atorvastatin (LIPITOR) 20 MG tablet Take 1 tablet (20 mg total) by mouth daily.  30 tablet  3  . cyclobenzaprine (FLEXERIL) 10 MG tablet Take 1 tablet (10 mg total) by mouth every 8 (eight) hours as needed for muscle spasms.  90 tablet  0  . HYDROcodone-acetaminophen (NORCO/VICODIN) 5-325 MG per tablet Take 1 tablet by mouth every 8 (eight) hours as needed for pain.  65 tablet  0  . magnesium gluconate (MAGONATE) 500 MG tablet Take 1 tablet (500 mg total) by mouth 2 (two) times daily.  60 tablet  3  . naproxen sodium (ANAPROX) 220 MG tablet Take 2 tablets (440 mg total) by mouth 2 (two) times daily with a meal.  60 tablet  1  . omeprazole  (PRILOSEC) 20 MG capsule Take 1 capsule (20 mg total) by mouth 2 (two) times daily.  60 capsule  3  . pioglitazone (ACTOS) 30 MG tablet Take 1 tablet (30 mg total) by mouth daily.  30 tablet  3  . pregabalin (LYRICA) 150 MG capsule Take 1 capsule (150 mg total) by mouth at bedtime.  30 capsule  3  . sertraline (ZOLOFT) 100 MG tablet Take 1 tablet (100 mg total) by mouth at bedtime. Take 1 & 1/2 tablet at night  30 tablet  3  . temazepam (RESTORIL) 15 MG capsule Take 1 capsule (15 mg total) by mouth at bedtime as needed for sleep.  30 capsule  3   No current facility-administered medications on file prior to visit.   Family History  Problem Relation Age of Onset  . Diabetes Mother   . Alcohol abuse Father   . Diabetes Father    History   Social History  . Marital Status: Married    Spouse Name: N/A    Number of Children: N/A  . Years of Education: N/A   Occupational History  . Not on file.   Social History Main Topics  . Smoking status: Current Every Day Smoker -- 1.00 packs/day  Types: Cigarettes  . Smokeless tobacco: Not on file  . Alcohol Use: Not on file  . Drug Use: Not on file  . Sexually Active: Not on file   Other Topics Concern  . Not on file   Social History Narrative  . No narrative on file    Review of Systems  Constitutional: Negative for fever, chills, diaphoresis, activity change, appetite change and fatigue.  HENT: Negative for ear pain, nosebleeds, congestion, facial swelling, rhinorrhea, neck pain, neck stiffness and ear discharge.   Eyes: Negative for pain, discharge, redness, itching and visual disturbance.  Respiratory: Negative for cough, choking, chest tightness, shortness of breath, wheezing and stridor.   Cardiovascular: Negative for chest pain, palpitations and leg swelling.  Gastrointestinal: Negative for abdominal distention.  Genitourinary: Negative for dysuria, urgency, frequency, hematuria, flank pain, decreased urine volume, difficulty  urinating and dyspareunia.  Musculoskeletal: chronic pain,  back and neck pain, joint swelling, arthralgias and Negative for gait problem.  Muscle cramps.  Neurological: Negative for dizziness, tremors, seizures, syncope, facial asymmetry, speech difficulty, weakness, light-headedness, numbness and headaches.  Hematological: Negative for adenopathy. Does not bruise/bleed easily.  Psychiatric/Behavioral: Negative for hallucinations, behavioral problems, confusion, dysphoric mood, decreased concentration and agitation.    Objective:   Filed Vitals:   02/11/13 0907  BP: 137/83  Pulse: 91  Temp: 98.1 F (36.7 C)  Resp: 20    Physical Exam  Constitutional: Appears well-developed and well-nourished. No distress.  HENT: Normocephalic. External right and left ear normal. Oropharynx is clear and moist.  Eyes: Conjunctivae and EOM are normal. PERRLA, no scleral icterus.  Neck: Normal ROM. Neck supple. No JVD. No tracheal deviation. No thyromegaly.  CVS: RRR, S1/S2 +, no murmurs, no gallops, no carotid bruit.  Pulmonary: Effort and breath sounds normal, no stridor, rhonchi, wheezes, rales.  Abdominal: Soft. BS +,  no distension, tenderness, rebound or guarding.  Musculoskeletal: Normal range of motion. Muscle tenderness in multiple points. Low back and neck pain with palpation.  Lymphadenopathy: No lymphadenopathy noted, cervical, inguinal. Neuro: Alert. Normal reflexes, muscle tone coordination. No cranial nerve deficit. Skin: age related changes and sun damage.  Skin is warm and dry. No rash noted. Not diaphoretic. No erythema. No pallor.  Psychiatric: Normal mood and affect. Behavior, judgment, thought content normal.   No results found for this basename: WBC, HGB, HCT, MCV, PLT   No results found for this basename: CREATININE, BUN, NA, K, CL, CO2    No results found for this basename: HGBA1C   Lipid Panel  No results found for this basename: chol, trig, hdl, cholhdl, vldl, ldlcalc     Assessment and plan:   Patient Active Problem List   Diagnosis Date Noted  . Type II or unspecified type diabetes mellitus without mention of complication, not stated as uncontrolled 02/11/2013  . Muscle cramps 02/11/2013  . Other malaise and fatigue 02/11/2013  . Dyslipidemia 02/11/2013  . Healthcare maintenance 02/11/2013  . Low back pain 02/11/2013  . HTN (hypertension) 01/12/2013  . Depression 01/12/2013    Pt says that she had a mammogram scheduled already for next week.   The patient was counseled on the dangers of tobacco use, and was advised to quit.  Reviewed strategies to maximize success, including removing cigarettes and smoking materials from environment.  I explained to patient that she would need to be referred to pain management for her chronic pain management.  I gave her a prescription today so that she could have her pain meds until  she can get established with pain mgmt.  I also requested to have her sign a new authorization for medical records from Dr. Allyne Gee office.   The patient verbalized understanding.    Follow up with psychiatrist for psychiatric med prescriptions  Check labs today  Check electrolytes to evaluate cramps  Pt refuses colonoscopy.  Sent home with hemoccult cards for colon cancer screening  Follow up in 3 months   C. Cyndie Mull, MD, CDE, FAAFP [facility] [place], [place]

## 2013-02-11 NOTE — Progress Notes (Signed)
Patient was told to come back in for physical from head to toe.

## 2013-02-12 ENCOUNTER — Telehealth: Payer: Self-pay | Admitting: *Deleted

## 2013-02-12 LAB — COMPLETE METABOLIC PANEL WITH GFR
ALT: 38 U/L — ABNORMAL HIGH (ref 0–35)
Albumin: 3.6 g/dL (ref 3.5–5.2)
CO2: 31 mEq/L (ref 19–32)
Creat: 0.89 mg/dL (ref 0.50–1.10)
GFR, Est African American: 83 mL/min
Sodium: 140 mEq/L (ref 135–145)
Total Bilirubin: 0.4 mg/dL (ref 0.3–1.2)
Total Protein: 6.2 g/dL (ref 6.0–8.3)

## 2013-02-12 LAB — LIPID PANEL
Cholesterol: 208 mg/dL — ABNORMAL HIGH (ref 0–200)
Triglycerides: 132 mg/dL (ref <150)

## 2013-02-12 LAB — MICROALBUMIN / CREATININE URINE RATIO: Creatinine, Urine: 99.5 mg/dL

## 2013-02-12 LAB — B. BURGDORFI ANTIBODIES: B burgdorferi Ab IgG+IgM: 0.27 {ISR}

## 2013-02-12 LAB — VITAMIN D 25 HYDROXY (VIT D DEFICIENCY, FRACTURES): Vit D, 25-Hydroxy: 28 ng/mL — ABNORMAL LOW (ref 30–89)

## 2013-02-12 NOTE — Telephone Encounter (Signed)
02/12/13 Patient unavailable message left via telephone that cholesterol was elevated and  Recommend low fat low cholesterol diet and exercise 5 times per week. Also Lyme disease came  Back negative. P.Emeka Lindner,RN BSN [facility]

## 2013-02-12 NOTE — Progress Notes (Signed)
Quick Note:  Please inform patient that labs came back OK except that cholesterol levels were elevated. Recommend low fat low cholesterol diet and exercise 5 times per week. Recheck labs in 4 months.    Rodney Langton, MD, CDE, FAAFP [facility] [place], [place]   ______

## 2013-02-12 NOTE — Progress Notes (Signed)
Quick Note:  Please inform patient that lyme disease test came back negative. Also magnesium test came back within normal limits.   Rodney Langton, MD, CDE, FAAFP [facility] [place], [place] ______

## 2013-02-15 NOTE — Addendum Note (Signed)
Addended by: Earlie Lou on: 02/15/2013 11:55 AM   Modules accepted: Orders

## 2013-02-16 ENCOUNTER — Other Ambulatory Visit: Payer: Self-pay | Admitting: Family Medicine

## 2013-02-16 MED ORDER — LISINOPRIL 2.5 MG PO TABS: 2.5000 mg | ORAL_TABLET | Freq: Every day | ORAL | Status: DC

## 2013-02-26 ENCOUNTER — Encounter: Payer: Self-pay | Admitting: Physical Medicine & Rehabilitation

## 2013-03-02 ENCOUNTER — Other Ambulatory Visit: Payer: Self-pay | Admitting: Internal Medicine

## 2013-03-02 NOTE — Telephone Encounter (Signed)
MEDICATION REFILL  flexeril

## 2013-03-19 ENCOUNTER — Encounter: Payer: Self-pay | Admitting: Internal Medicine

## 2013-03-19 ENCOUNTER — Ambulatory Visit: Attending: Internal Medicine | Admitting: Internal Medicine

## 2013-03-19 VITALS — BP 125/80 | HR 80 | Temp 98.4°F | Resp 16 | Ht 68.11 in | Wt 267.5 lb

## 2013-03-19 DIAGNOSIS — M25569 Pain in unspecified knee: Secondary | ICD-10-CM | POA: Insufficient documentation

## 2013-03-19 DIAGNOSIS — M25562 Pain in left knee: Secondary | ICD-10-CM

## 2013-03-19 DIAGNOSIS — K031 Abrasion of teeth: Secondary | ICD-10-CM

## 2013-03-19 DIAGNOSIS — E785 Hyperlipidemia, unspecified: Secondary | ICD-10-CM

## 2013-03-19 MED ORDER — TRAMADOL HCL 50 MG PO TABS: 50.0000 mg | ORAL_TABLET | Freq: Three times a day (TID) | ORAL | Status: DC | PRN

## 2013-03-19 NOTE — Patient Instructions (Addendum)
Knee Pain  The knee is the complex joint between your thigh and your lower leg. It is made up of bones, tendons, ligaments, and cartilage. The bones that make up the knee are:   The femur in the thigh.   The tibia and fibula in the lower leg.   The patella or kneecap riding in the groove on the lower femur.  CAUSES   Knee pain is a common complaint with many causes. A few of these causes are:   Injury, such as:   A ruptured ligament or tendon injury.   Torn cartilage.   Medical conditions, such as:   Gout   Arthritis   Infections   Overuse, over training or overdoing a physical activity.  Knee pain can be minor or severe. Knee pain can accompany debilitating injury. Minor knee problems often respond well to self-care measures or get well on their own. More serious injuries may need medical intervention or even surgery.  SYMPTOMS  The knee is complex. Symptoms of knee problems can vary widely. Some of the problems are:   Pain with movement and weight bearing.   Swelling and tenderness.   Buckling of the knee.   Inability to straighten or extend your knee.   Your knee locks and you cannot straighten it.   Warmth and redness with pain and fever.   Deformity or dislocation of the kneecap.  DIAGNOSIS   Determining what is wrong may be very straight forward such as when there is an injury. It can also be challenging because of the complexity of the knee. Tests to make a diagnosis may include:   Your caregiver taking a history and doing a physical exam.   Routine X-rays can be used to rule out other problems. X-rays will not reveal a cartilage tear. Some injuries of the knee can be diagnosed by:   Arthroscopy a surgical technique by which a small video camera is inserted through tiny incisions on the sides of the knee. This procedure is used to examine and repair internal knee joint problems. Tiny instruments can be used during arthroscopy to repair the torn knee cartilage (meniscus).   Arthrography  is a radiology technique. A contrast liquid is directly injected into the knee joint. Internal structures of the knee joint then become visible on X-ray film.   An MRI scan is a non x-ray radiology procedure in which magnetic fields and a computer produce two- or three-dimensional images of the inside of the knee. Cartilage tears are often visible using an MRI scanner. MRI scans have largely replaced arthrography in diagnosing cartilage tears of the knee.   Blood work.   Examination of the fluid that helps to lubricate the knee joint (synovial fluid). This is done by taking a sample out using a needle and a syringe.  TREATMENT  The treatment of knee problems depends on the cause. Some of these treatments are:   Depending on the injury, proper casting, splinting, surgery or physical therapy care will be needed.   Give yourself adequate recovery time. Do not overuse your joints. If you begin to get sore during workout routines, back off. Slow down or do fewer repetitions.   For repetitive activities such as cycling or running, maintain your strength and nutrition.   Alternate muscle groups. For example if you are a weight lifter, work the upper body on one day and the lower body the next.   Either tight or weak muscles do not give the proper support for your   knee. Tight or weak muscles do not absorb the stress placed on the knee joint. Keep the muscles surrounding the knee strong.   Take care of mechanical problems.   If you have flat feet, orthotics or special shoes may help. See your caregiver if you need help.   Arch supports, sometimes with wedges on the inner or outer aspect of the heel, can help. These can shift pressure away from the side of the knee most bothered by osteoarthritis.   A brace called an "unloader" brace also may be used to help ease the pressure on the most arthritic side of the knee.   If your caregiver has prescribed crutches, braces, wraps or ice, use as directed. The acronym for  this is PRICE. This means protection, rest, ice, compression and elevation.   Nonsteroidal anti-inflammatory drugs (NSAID's), can help relieve pain. But if taken immediately after an injury, they may actually increase swelling. Take NSAID's with food in your stomach. Stop them if you develop stomach problems. Do not take these if you have a history of ulcers, stomach pain or bleeding from the bowel. Do not take without your caregiver's approval if you have problems with fluid retention, heart failure, or kidney problems.   For ongoing knee problems, physical therapy may be helpful.   Glucosamine and chondroitin are over-the-counter dietary supplements. Both may help relieve the pain of osteoarthritis in the knee. These medicines are different from the usual anti-inflammatory drugs. Glucosamine may decrease the rate of cartilage destruction.   Injections of a corticosteroid drug into your knee joint may help reduce the symptoms of an arthritis flare-up. They may provide pain relief that lasts a few months. You may have to wait a few months between injections. The injections do have a small increased risk of infection, water retention and elevated blood sugar levels.   Hyaluronic acid injected into damaged joints may ease pain and provide lubrication. These injections may work by reducing inflammation. A series of shots may give relief for as long as 6 months.   Topical painkillers. Applying certain ointments to your skin may help relieve the pain and stiffness of osteoarthritis. Ask your pharmacist for suggestions. Many over the-counter products are approved for temporary relief of arthritis pain.   In some countries, doctors often prescribe topical NSAID's for relief of chronic conditions such as arthritis and tendinitis. A review of treatment with NSAID creams found that they worked as well as oral medications but without the serious side effects.  PREVENTION   Maintain a healthy weight. Extra pounds put  more strain on your joints.   Get strong, stay limber. Weak muscles are a common cause of knee injuries. Stretching is important. Include flexibility exercises in your workouts.   Be smart about exercise. If you have osteoarthritis, chronic knee pain or recurring injuries, you may need to change the way you exercise. This does not mean you have to stop being active. If your knees ache after jogging or playing basketball, consider switching to swimming, water aerobics or other low-impact activities, at least for a few days a week. Sometimes limiting high-impact activities will provide relief.   Make sure your shoes fit well. Choose footwear that is right for your sport.   Protect your knees. Use the proper gear for knee-sensitive activities. Use kneepads when playing volleyball or laying carpet. Buckle your seat belt every time you drive. Most shattered kneecaps occur in car accidents.   Rest when you are tired.  SEEK MEDICAL CARE IF:     You have knee pain that is continual and does not seem to be getting better.   SEEK IMMEDIATE MEDICAL CARE IF:   Your knee joint feels hot to the touch and you have a high fever.  MAKE SURE YOU:    Understand these instructions.   Will watch your condition.   Will get help right away if you are not doing well or get worse.  Document Released: 05/05/2007 Document Revised: 09/30/2011 Document Reviewed: 05/05/2007  [employer] Patient Information 2014 [employer].

## 2013-03-19 NOTE — Progress Notes (Signed)
Patient ID: Theresa Fernandez, female   DOB: [birth year], 57 y.o.   MRN: [id number]  CC: Knee pain  HPI: 57 year old female with multiple medical conditions including but not limited to hypertension, depression and arthritis who presented to clinic for followup. Patient reported having significant left knee pain on ambulation as well as swelling which usually comes on at night time. Patient reports taking swimming classes for better exercise but pain in the left knee is persistent with minimal range of motion. She is able to ambulate but with significant pain. She takes naproxen for her pain is still 7/10 in intensity. No other complaints of chest pain or palpitations or abdominal pain or nausea or vomiting.  Allergies  Allergen Reactions  . Topamax [Topiramate]    Past Medical History  Diagnosis Date  . Allergy   . Anxiety   . Arthritis   . Depression   . Diabetes mellitus without complication   . HTN (hypertension) 01/12/2013   Current Outpatient Prescriptions on File Prior to Visit  Medication Sig Dispense Refill  . ALPRAZolam (XANAX) 0.5 MG tablet Take 1 tablet (0.5 mg total) by mouth 3 (three) times daily as needed for sleep.  45 tablet  0  . amphetamine-dextroamphetamine (ADDERALL) 20 MG tablet Take 1 tablet (20 mg total) by mouth 3 (three) times daily.  90 tablet  0  . atorvastatin (LIPITOR) 20 MG tablet Take 1 tablet (20 mg total) by mouth daily.  30 tablet  3  . cyclobenzaprine (FLEXERIL) 10 MG tablet TAKE 1 TABLET BY MOUTH EVERY 8 HOURS AS NEEDED FOR MUSCLE SPASMS  90 tablet  0  . HYDROcodone-acetaminophen (NORCO/VICODIN) 5-325 MG per tablet Take 1 tablet by mouth every 8 (eight) hours as needed for pain.  70 tablet  0  . lisinopril (ZESTRIL) 2.5 MG tablet Take 1 tablet (2.5 mg total) by mouth daily.      . magnesium gluconate (MAGONATE) 500 MG tablet Take 1 tablet (500 mg total) by mouth 2 (two) times daily.  60 tablet  3  . naproxen sodium (ANAPROX) 220 MG tablet Take 2 tablets  (440 mg total) by mouth 2 (two) times daily with a meal.  60 tablet  1  . omeprazole (PRILOSEC) 20 MG capsule Take 1 capsule (20 mg total) by mouth 2 (two) times daily.  60 capsule  3  . pioglitazone (ACTOS) 30 MG tablet Take 1 tablet (30 mg total) by mouth daily.  30 tablet  3  . pregabalin (LYRICA) 150 MG capsule Take 1 capsule (150 mg total) by mouth at bedtime.  30 capsule  3  . sertraline (ZOLOFT) 100 MG tablet Take 1 tablet (100 mg total) by mouth at bedtime. Take 1 & 1/2 tablet at night  30 tablet  3  . temazepam (RESTORIL) 15 MG capsule Take 1 capsule (15 mg total) by mouth at bedtime as needed for sleep.  30 capsule  3   No current facility-administered medications on file prior to visit.   Family History  Problem Relation Age of Onset  . Diabetes Mother   . Alcohol abuse Father   . Diabetes Father    History   Social History  . Marital Status: Married    Spouse Name: N/A    Number of Children: N/A  . Years of Education: N/A   Occupational History  . Not on file.   Social History Main Topics  . Smoking status: Current Every Day Smoker -- 1.00 packs/day    Types: Cigarettes  .  Smokeless tobacco: Not on file  . Alcohol Use: Not on file  . Drug Use: Not on file  . Sexual Activity: Not on file   Other Topics Concern  . Not on file   Social History Narrative  . No narrative on file    Review of Systems  Constitutional: Negative for fever, chills, diaphoresis, activity change, appetite change and fatigue.  HENT: Negative for ear pain, nosebleeds, congestion, facial swelling, rhinorrhea, neck pain, neck stiffness and ear discharge.   Eyes: Negative for pain, discharge, redness, itching and visual disturbance.  Respiratory: Negative for cough, choking, chest tightness, shortness of breath, wheezing and stridor.   Cardiovascular: Negative for chest pain, palpitations and leg swelling.  Gastrointestinal: Negative for abdominal distention.  Genitourinary: Negative for  dysuria, urgency, frequency, hematuria, flank pain, decreased urine volume, difficulty urinating and dyspareunia.  Musculoskeletal: Left knee pain and swelling, low back pain  Neurological: Negative for dizziness, tremors, seizures, syncope, facial asymmetry, speech difficulty, weakness, light-headedness, numbness and headaches.  Hematological: Negative for adenopathy. Does not bruise/bleed easily.  Psychiatric/Behavioral: Negative for hallucinations, behavioral problems, confusion, dysphoric mood, decreased concentration and agitation.    Objective:   Filed Vitals:   03/19/13 1141  BP: 125/80  Pulse: 80  Temp:   Resp: 16    Physical Exam  Constitutional: Appears well-developed and well-nourished. No distress.  HENT: Normocephalic. External right and left ear normal. Oropharynx is clear and moist.  Eyes: Conjunctivae and EOM are normal. PERRLA, no scleral icterus.  Neck: Normal ROM. Neck supple. No JVD. No tracheal deviation. No thyromegaly.  CVS: RRR, S1/S2 +, no murmurs, no gallops, no carotid bruit.  Pulmonary: Effort and breath sounds normal, no stridor, rhonchi, wheezes, rales.  Abdominal: Soft. BS +,  no distension, tenderness, rebound or guarding.  Musculoskeletal: Left knee pain on palpation slight swelling.  right knee with good range of motion  Lymphadenopathy: No lymphadenopathy noted, cervical, inguinal. Neuro: Alert. Normal reflexes, muscle tone coordination. No cranial nerve deficit. Skin: Skin is warm and dry. No rash noted. Not diaphoretic. No erythema. No pallor.  Psychiatric: Normal mood and affect. Behavior, judgment, thought content normal.   Lab Results  Component Value Date   WBC 10.4 02/11/2013   HGB 13.1 02/11/2013   HCT 39.7 02/11/2013   MCV 91.3 02/11/2013   PLT 315 02/11/2013   Lab Results  Component Value Date   CREATININE 0.89 02/11/2013   BUN 12 02/11/2013   NA 140 02/11/2013   K 4.5 02/11/2013   CL 105 02/11/2013   CO2 31 02/11/2013    Lab Results   Component Value Date   HGBA1C positive 02/15/2013   Lipid Panel     Component Value Date/Time   CHOL 208* 02/11/2013 0922   TRIG 132 02/11/2013 0922   HDL 47 02/11/2013 0922   CHOLHDL 4.4 02/11/2013 0922   VLDL 26 02/11/2013 0922   LDLCALC 135* 02/11/2013 0922       Assessment and plan:   Patient Active Problem List   Diagnosis Date Noted  . Knee pain, left  03/19/2013    Priority: High - Obtain x-ray of the left knee  - Scription provided for Ultram for better pain control  - Patient has a scheduled appointment for physical therapy March 26, 2013  . Dyslipidemia 02/11/2013    Priority: Medium - Continue current statin therapy

## 2013-03-19 NOTE — Progress Notes (Signed)
Pt is here c/o left knee pain onset 1 week... Reports she twisted her knee getting off her car... Pain increases w/activity Also needing referral to see a dentist Alert w/no signs of acute distress.

## 2013-03-20 ENCOUNTER — Encounter (HOSPITAL_COMMUNITY): Payer: Self-pay | Admitting: Emergency Medicine

## 2013-03-20 ENCOUNTER — Emergency Department (INDEPENDENT_AMBULATORY_CARE_PROVIDER_SITE_OTHER)
Admission: EM | Admit: 2013-03-20 | Discharge: 2013-03-20 | Disposition: A | Discharge status: Home / Self Care | Source: Home / Self Care | Attending: Family Medicine | Admitting: Family Medicine

## 2013-03-20 DIAGNOSIS — G8929 Other chronic pain: Secondary | ICD-10-CM

## 2013-03-20 DIAGNOSIS — M25569 Pain in unspecified knee: Secondary | ICD-10-CM

## 2013-03-20 NOTE — ED Provider Notes (Signed)
CSN: [id number]     Arrival date & time 03/20/13  1119 History   First MD Initiated Contact with Patient 03/20/13 1243     Chief Complaint  Patient presents with  . Knee Pain   (Consider location/radiation/quality/duration/timing/severity/associated sxs/prior Treatment) Patient is a 57 y.o. female presenting with knee pain. The history is provided by the patient.  Knee Pain Location:  Knee Time since incident:  1 day (pain for 1 wk, not reporting that she was seen yest at adult clinic for same.) Injury: yes   Knee location:  L knee Pain details:    Severity:  Moderate Chronicity:  Chronic Dislocation: no   Foreign body present:  No foreign bodies Prior injury to area:  No Relieved by:  Nothing Worsened by:  Activity Ineffective treatments:  None tried   Past Medical History  Diagnosis Date  . Allergy   . Anxiety   . Arthritis   . Depression   . Diabetes mellitus without complication   . HTN (hypertension) 01/12/2013   Past Surgical History  Procedure Laterality Date  . Tubal ligation     Family History  Problem Relation Age of Onset  . Diabetes Mother   . Alcohol abuse Father   . Diabetes Father    History  Substance Use Topics  . Smoking status: Current Every Day Smoker -- 1.00 packs/day    Types: Cigarettes  . Smokeless tobacco: Not on file  . Alcohol Use: Not on file   OB History   Grav Para Term Preterm Abortions TAB SAB Ect Mult Living                 Review of Systems  Constitutional: Negative.   Musculoskeletal: Positive for joint swelling. Negative for myalgias and gait problem.  Skin: Negative.     Allergies  Topamax  Home Medications   Current Outpatient Rx  Name  Route  Sig  Dispense  Refill  . omeprazole (PRILOSEC) 20 MG capsule   Oral   Take 1 capsule (20 mg total) by mouth 2 (two) times daily.   60 capsule   3   . traMADol (ULTRAM) 50 MG tablet   Oral   Take 1 tablet (50 mg total) by mouth every 8 (eight) hours as needed for  pain.   60 tablet   0   . ALPRAZolam (XANAX) 0.5 MG tablet   Oral   Take 1 tablet (0.5 mg total) by mouth 3 (three) times daily as needed for sleep.   45 tablet   0   . amphetamine-dextroamphetamine (ADDERALL) 20 MG tablet   Oral   Take 1 tablet (20 mg total) by mouth 3 (three) times daily.   90 tablet   0   . atorvastatin (LIPITOR) 20 MG tablet   Oral   Take 1 tablet (20 mg total) by mouth daily.   30 tablet   3   . cyclobenzaprine (FLEXERIL) 10 MG tablet      TAKE 1 TABLET BY MOUTH EVERY 8 HOURS AS NEEDED FOR MUSCLE SPASMS   90 tablet   0   . HYDROcodone-acetaminophen (NORCO/VICODIN) 5-325 MG per tablet   Oral   Take 1 tablet by mouth every 8 (eight) hours as needed for pain.   70 tablet   0   . lisinopril (ZESTRIL) 2.5 MG tablet   Oral   Take 1 tablet (2.5 mg total) by mouth daily.         . magnesium gluconate (MAGONATE) 500 MG  tablet   Oral   Take 1 tablet (500 mg total) by mouth 2 (two) times daily.   60 tablet   3   . naproxen sodium (ANAPROX) 220 MG tablet   Oral   Take 2 tablets (440 mg total) by mouth 2 (two) times daily with a meal.   60 tablet   1   . pioglitazone (ACTOS) 30 MG tablet   Oral   Take 1 tablet (30 mg total) by mouth daily.   30 tablet   3   . pregabalin (LYRICA) 150 MG capsule   Oral   Take 1 capsule (150 mg total) by mouth at bedtime.   30 capsule   3   . sertraline (ZOLOFT) 100 MG tablet   Oral   Take 1 tablet (100 mg total) by mouth at bedtime. Take 1 & 1/2 tablet at night   30 tablet   3   . temazepam (RESTORIL) 15 MG capsule   Oral   Take 1 capsule (15 mg total) by mouth at bedtime as needed for sleep.   30 capsule   3    BP 123/85  Pulse 100  Temp(Src) 98.9 F (37.2 C) (Oral)  Resp 19  SpO2 96% Physical Exam  Nursing note and vitals reviewed. Constitutional: She is oriented to person, place, and time. She appears well-developed and well-nourished.  Musculoskeletal: She exhibits tenderness.        Left knee: She exhibits decreased range of motion. She exhibits no swelling, no effusion, no ecchymosis, no deformity, no erythema, normal patellar mobility, no bony tenderness and normal meniscus. No patellar tendon tenderness noted.  Neurological: She is alert and oriented to person, place, and time.  Skin: Skin is warm and dry.    ED Course  Procedures (including critical care time) Labs Review Labs Reviewed - No data to display Imaging Review No results found.  MDM   1. Knee pain, chronic, left       Linna Hoff, MD 03/20/13 1327

## 2013-03-20 NOTE — ED Notes (Signed)
Pt c/o left knee pain onset 1 week Reports she fell this am and bumped her knee again and aggrevate it more.  [facility] Alert w/no signs of acute distress.

## 2013-03-24 ENCOUNTER — Ambulatory Visit (HOSPITAL_COMMUNITY)
Admission: RE | Admit: 2013-03-24 | Discharge: 2013-03-24 | Disposition: A | Discharge status: Home / Self Care | Source: Ambulatory Visit | Attending: Internal Medicine | Admitting: Internal Medicine

## 2013-03-24 DIAGNOSIS — M25469 Effusion, unspecified knee: Secondary | ICD-10-CM | POA: Insufficient documentation

## 2013-03-24 DIAGNOSIS — M25569 Pain in unspecified knee: Secondary | ICD-10-CM | POA: Insufficient documentation

## 2013-03-24 DIAGNOSIS — K031 Abrasion of teeth: Secondary | ICD-10-CM

## 2013-03-24 IMAGING — CR DG KNEE COMPLETE 4+V*L*
4 series · 4 of 4 positions shown · non-contrast
Comparison: None.

CLINICAL DATA: Post left knee with pain and swelling

LEFT KNEE - COMPLETE 4+ VIEW

[t knee ap left]
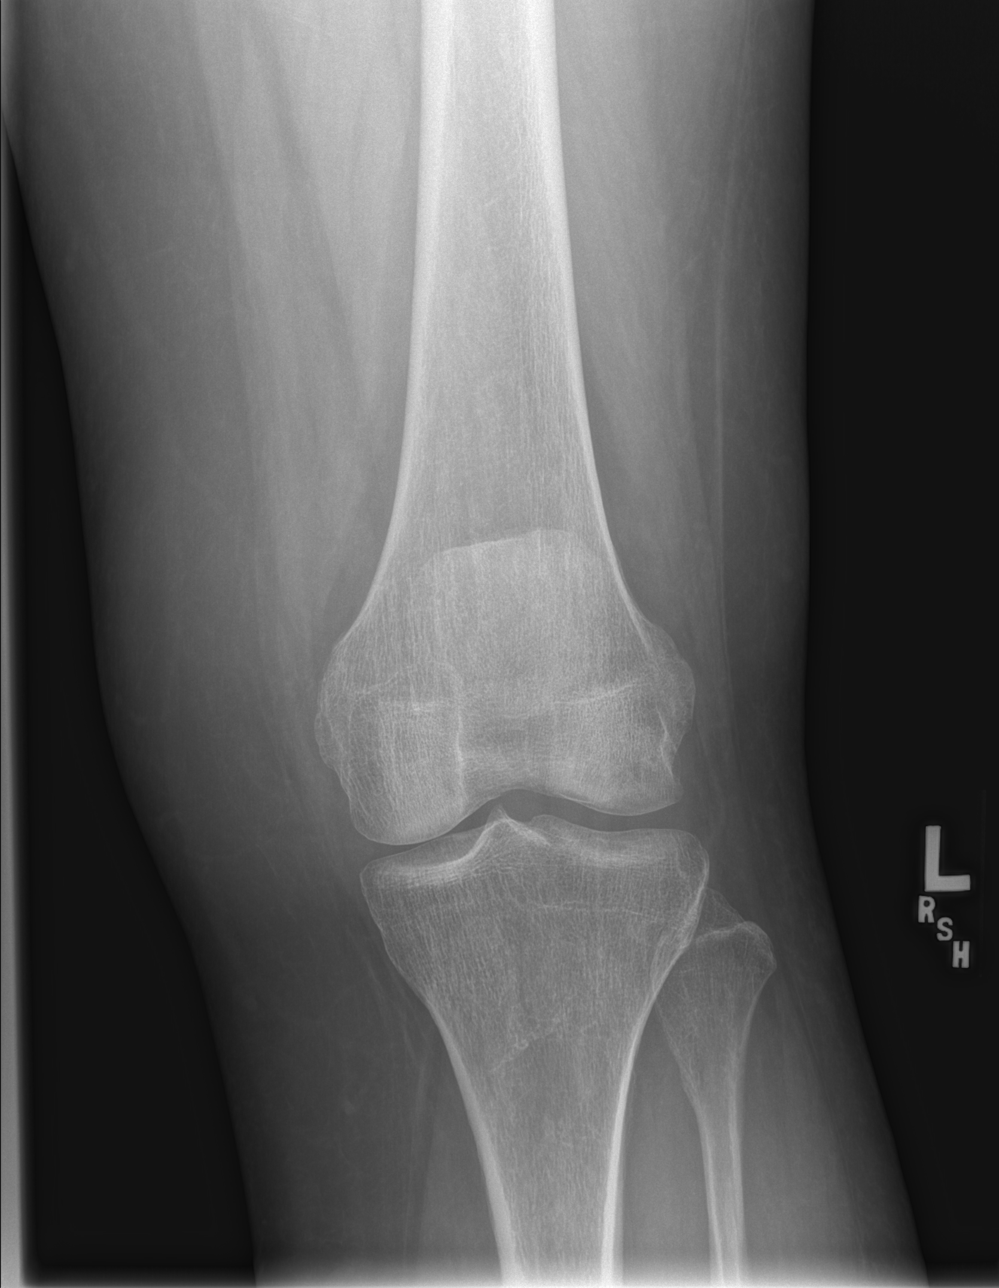

[t knee obl left (1 of 2)]
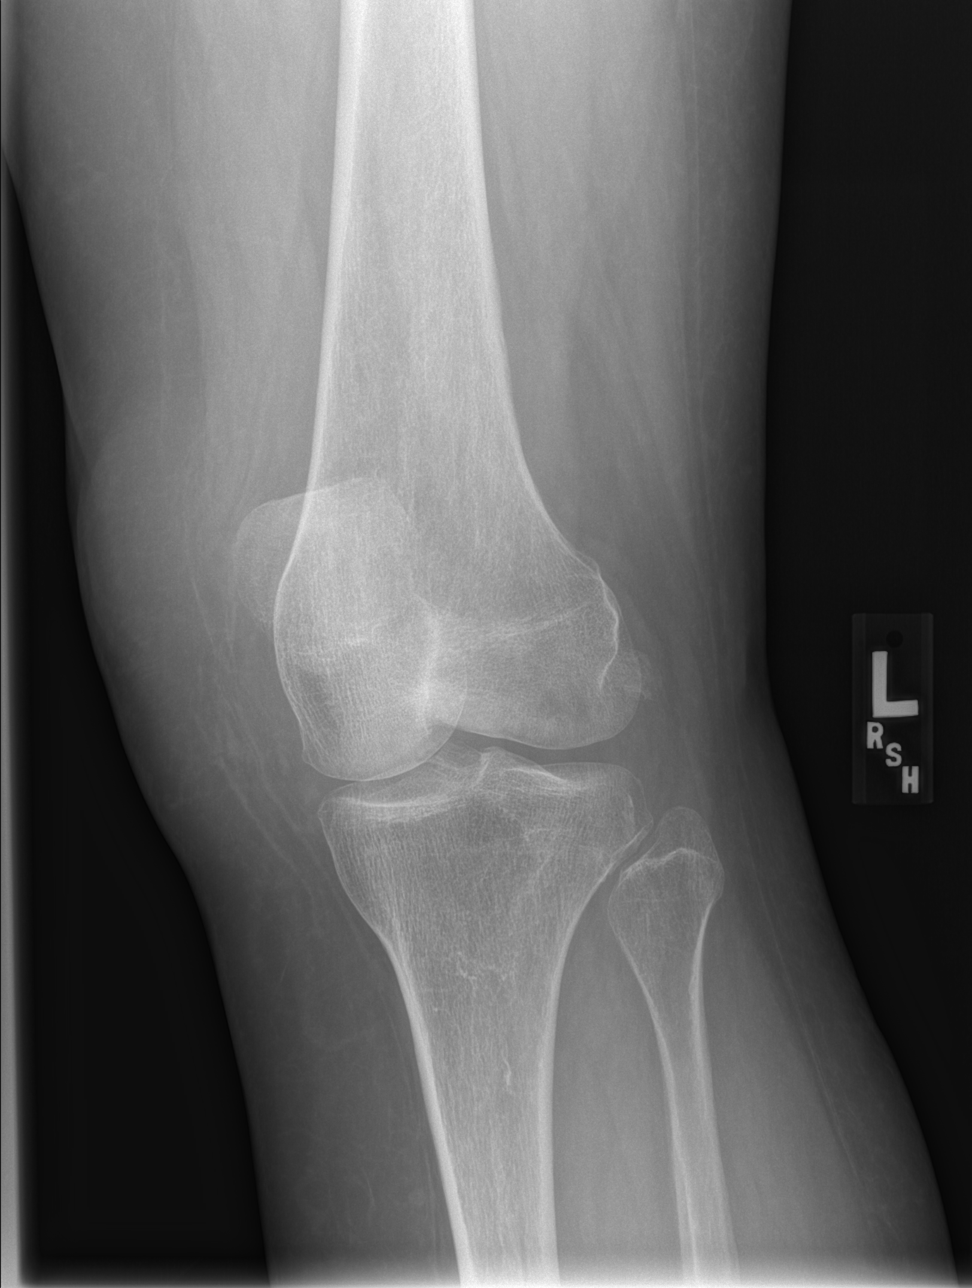

[t knee obl left (2 of 2)]
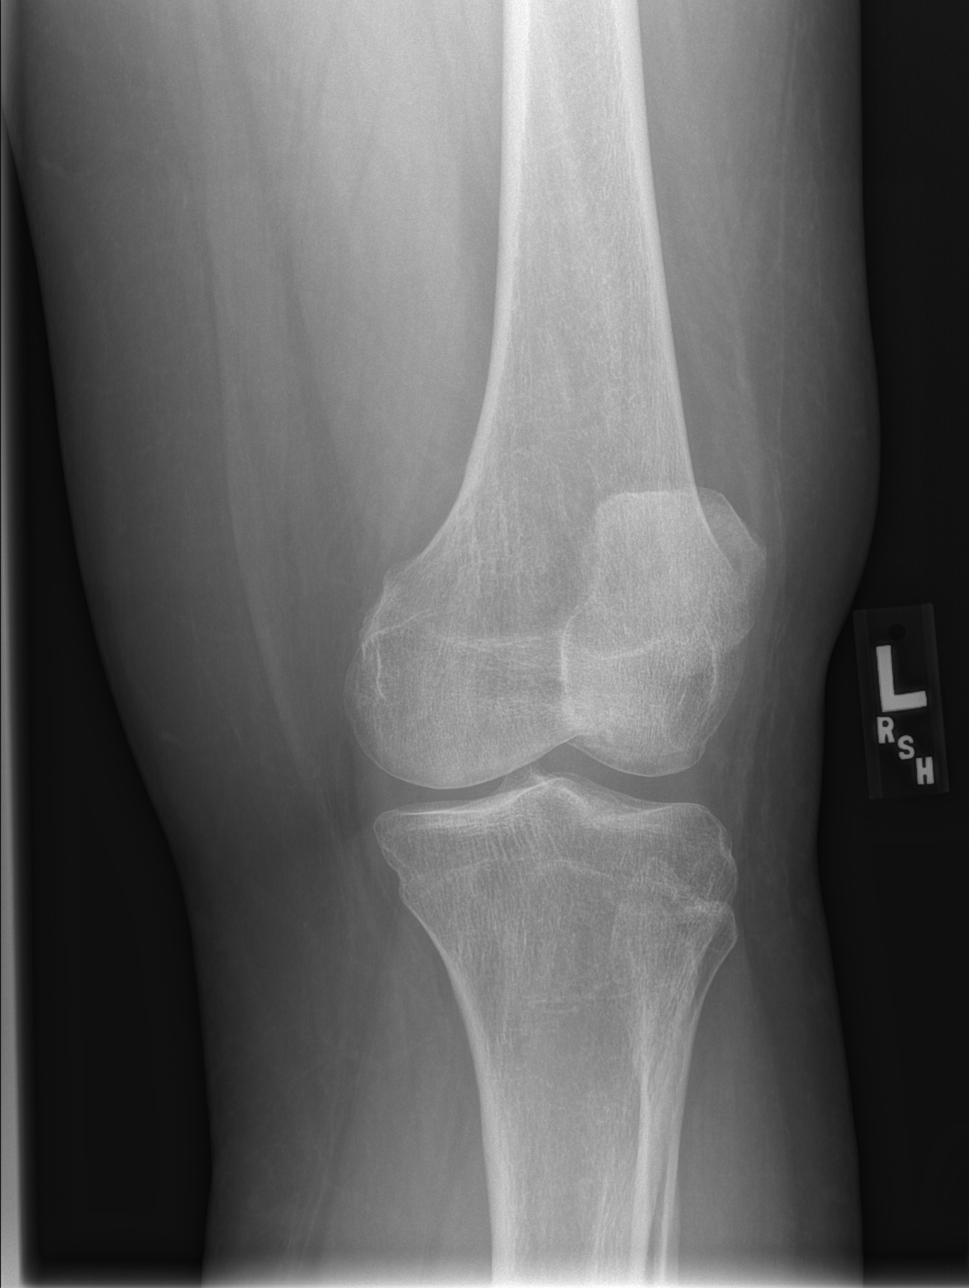

[t knee lat left]
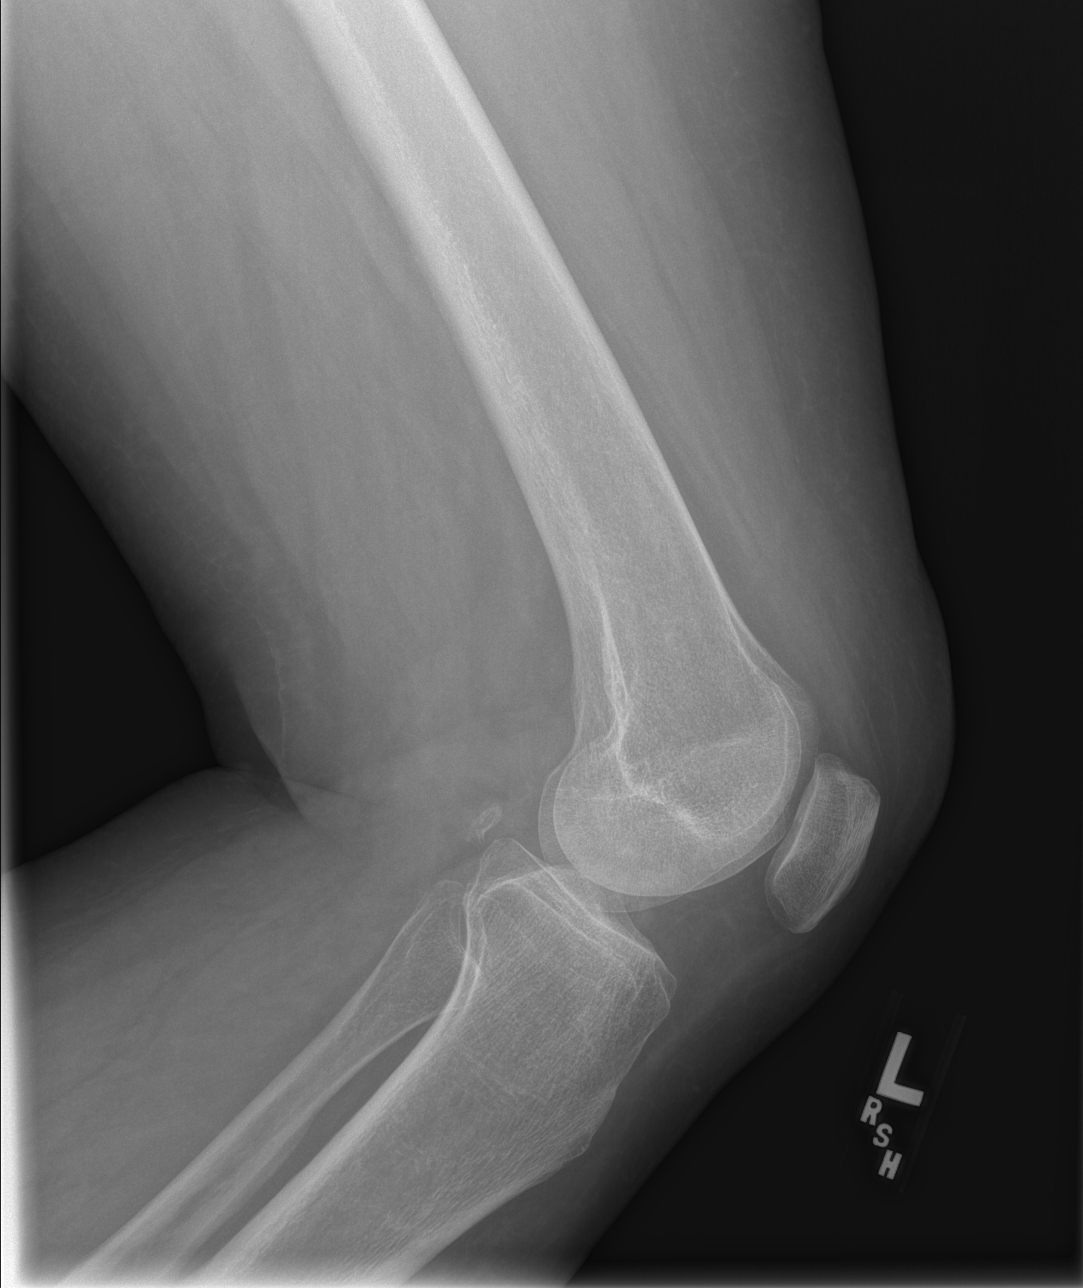

[4 of 4 positions shown; findings below may reference images not displayed]

FINDINGS: There is no fracture or dislocation.  There is diffuse
osteopenia.  No joint effusion.
IMPRESSION: No acute findings

## 2013-03-26 ENCOUNTER — Ambulatory Visit: Admitting: Physical Medicine & Rehabilitation

## 2013-03-26 ENCOUNTER — Encounter

## 2013-04-16 ENCOUNTER — Other Ambulatory Visit: Payer: Self-pay | Admitting: Internal Medicine

## 2013-04-24 ENCOUNTER — Other Ambulatory Visit: Payer: Self-pay | Admitting: Internal Medicine

## 2013-04-26 NOTE — Telephone Encounter (Signed)
Medication refill

## 2013-05-14 ENCOUNTER — Ambulatory Visit

## 2013-05-27 ENCOUNTER — Other Ambulatory Visit: Payer: Self-pay

## 2013-06-03 ENCOUNTER — Other Ambulatory Visit: Payer: Self-pay | Admitting: Internal Medicine

## 2013-06-03 DIAGNOSIS — Z1231 Encounter for screening mammogram for malignant neoplasm of breast: Secondary | ICD-10-CM

## 2013-06-30 ENCOUNTER — Ambulatory Visit
Admission: RE | Admit: 2013-06-30 | Discharge: 2013-06-30 | Disposition: A | Discharge status: Home / Self Care | Source: Ambulatory Visit | Attending: Internal Medicine | Admitting: Internal Medicine

## 2013-06-30 ENCOUNTER — Ambulatory Visit

## 2013-06-30 DIAGNOSIS — Z1231 Encounter for screening mammogram for malignant neoplasm of breast: Secondary | ICD-10-CM

## 2014-05-10 ENCOUNTER — Other Ambulatory Visit: Payer: Self-pay

## 2014-05-10 ENCOUNTER — Emergency Department (HOSPITAL_COMMUNITY)
Admission: EM | Admit: 2014-05-10 | Discharge: 2014-05-10 | Disposition: A | Discharge status: Home / Self Care | Attending: Emergency Medicine | Admitting: Emergency Medicine

## 2014-05-10 ENCOUNTER — Encounter (HOSPITAL_COMMUNITY): Payer: Self-pay | Admitting: Emergency Medicine

## 2014-05-10 ENCOUNTER — Emergency Department (HOSPITAL_COMMUNITY)

## 2014-05-10 DIAGNOSIS — J440 Chronic obstructive pulmonary disease with acute lower respiratory infection: Secondary | ICD-10-CM

## 2014-05-10 DIAGNOSIS — Z79899 Other long term (current) drug therapy: Secondary | ICD-10-CM | POA: Insufficient documentation

## 2014-05-10 DIAGNOSIS — Z720 Tobacco use: Secondary | ICD-10-CM | POA: Diagnosis not present

## 2014-05-10 DIAGNOSIS — R0789 Other chest pain: Secondary | ICD-10-CM | POA: Diagnosis not present

## 2014-05-10 DIAGNOSIS — I10 Essential (primary) hypertension: Secondary | ICD-10-CM | POA: Insufficient documentation

## 2014-05-10 DIAGNOSIS — Z7952 Long term (current) use of systemic steroids: Secondary | ICD-10-CM | POA: Insufficient documentation

## 2014-05-10 DIAGNOSIS — M1990 Unspecified osteoarthritis, unspecified site: Secondary | ICD-10-CM | POA: Diagnosis not present

## 2014-05-10 DIAGNOSIS — E119 Type 2 diabetes mellitus without complications: Secondary | ICD-10-CM | POA: Insufficient documentation

## 2014-05-10 DIAGNOSIS — F419 Anxiety disorder, unspecified: Secondary | ICD-10-CM | POA: Diagnosis not present

## 2014-05-10 DIAGNOSIS — J209 Acute bronchitis, unspecified: Secondary | ICD-10-CM

## 2014-05-10 DIAGNOSIS — R0602 Shortness of breath: Secondary | ICD-10-CM | POA: Diagnosis present

## 2014-05-10 DIAGNOSIS — J441 Chronic obstructive pulmonary disease with (acute) exacerbation: Secondary | ICD-10-CM | POA: Insufficient documentation

## 2014-05-10 DIAGNOSIS — F329 Major depressive disorder, single episode, unspecified: Secondary | ICD-10-CM | POA: Diagnosis not present

## 2014-05-10 LAB — CBC WITH DIFFERENTIAL/PLATELET
BASOS ABS: 0 10*3/uL (ref 0.0–0.1)
Basophils Relative: 0 % (ref 0–1)
EOS PCT: 1 % (ref 0–5)
Eosinophils Absolute: 0.1 10*3/uL (ref 0.0–0.7)
HEMATOCRIT: 40.1 % (ref 36.0–46.0)
HEMOGLOBIN: 13.1 g/dL (ref 12.0–15.0)
LYMPHS ABS: 2.7 10*3/uL (ref 0.7–4.0)
LYMPHS PCT: 29 % (ref 12–46)
MCH: 31.1 pg (ref 26.0–34.0)
MCHC: 32.7 g/dL (ref 30.0–36.0)
MCV: 95.2 fL (ref 78.0–100.0)
MONO ABS: 0.6 10*3/uL (ref 0.1–1.0)
MONOS PCT: 6 % (ref 3–12)
NEUTROS ABS: 6 10*3/uL (ref 1.7–7.7)
Neutrophils Relative %: 64 % (ref 43–77)
Platelets: 257 10*3/uL (ref 150–400)
RBC: 4.21 MIL/uL (ref 3.87–5.11)
RDW: 14.3 % (ref 11.5–15.5)
WBC: 9.4 10*3/uL (ref 4.0–10.5)

## 2014-05-10 LAB — COMPREHENSIVE METABOLIC PANEL
ALT: 16 U/L (ref 0–35)
ANION GAP: 13 (ref 5–15)
AST: 16 U/L (ref 0–37)
Albumin: 3.6 g/dL (ref 3.5–5.2)
Alkaline Phosphatase: 107 U/L (ref 39–117)
BUN: 11 mg/dL (ref 6–23)
CALCIUM: 9 mg/dL (ref 8.4–10.5)
CHLORIDE: 100 meq/L (ref 96–112)
CO2: 24 meq/L (ref 19–32)
CREATININE: 1.03 mg/dL (ref 0.50–1.10)
GFR, EST AFRICAN AMERICAN: 68 mL/min — AB (ref >90)
GFR, EST NON AFRICAN AMERICAN: 58 mL/min — AB (ref >90)
GLUCOSE: 111 mg/dL — AB (ref 70–99)
Potassium: 4.4 mEq/L (ref 3.7–5.3)
Sodium: 137 mEq/L (ref 137–147)
Total Protein: 7.2 g/dL (ref 6.0–8.3)

## 2014-05-10 IMAGING — CR DG CHEST 2V
2 series · 2 of 2 positions shown · non-contrast
Comparison: [DATE]

CLINICAL DATA: Mid chest pain and shortness of breath history of
recent pneumonia

EXAM:
CHEST  2 VIEW

[w chest pa]
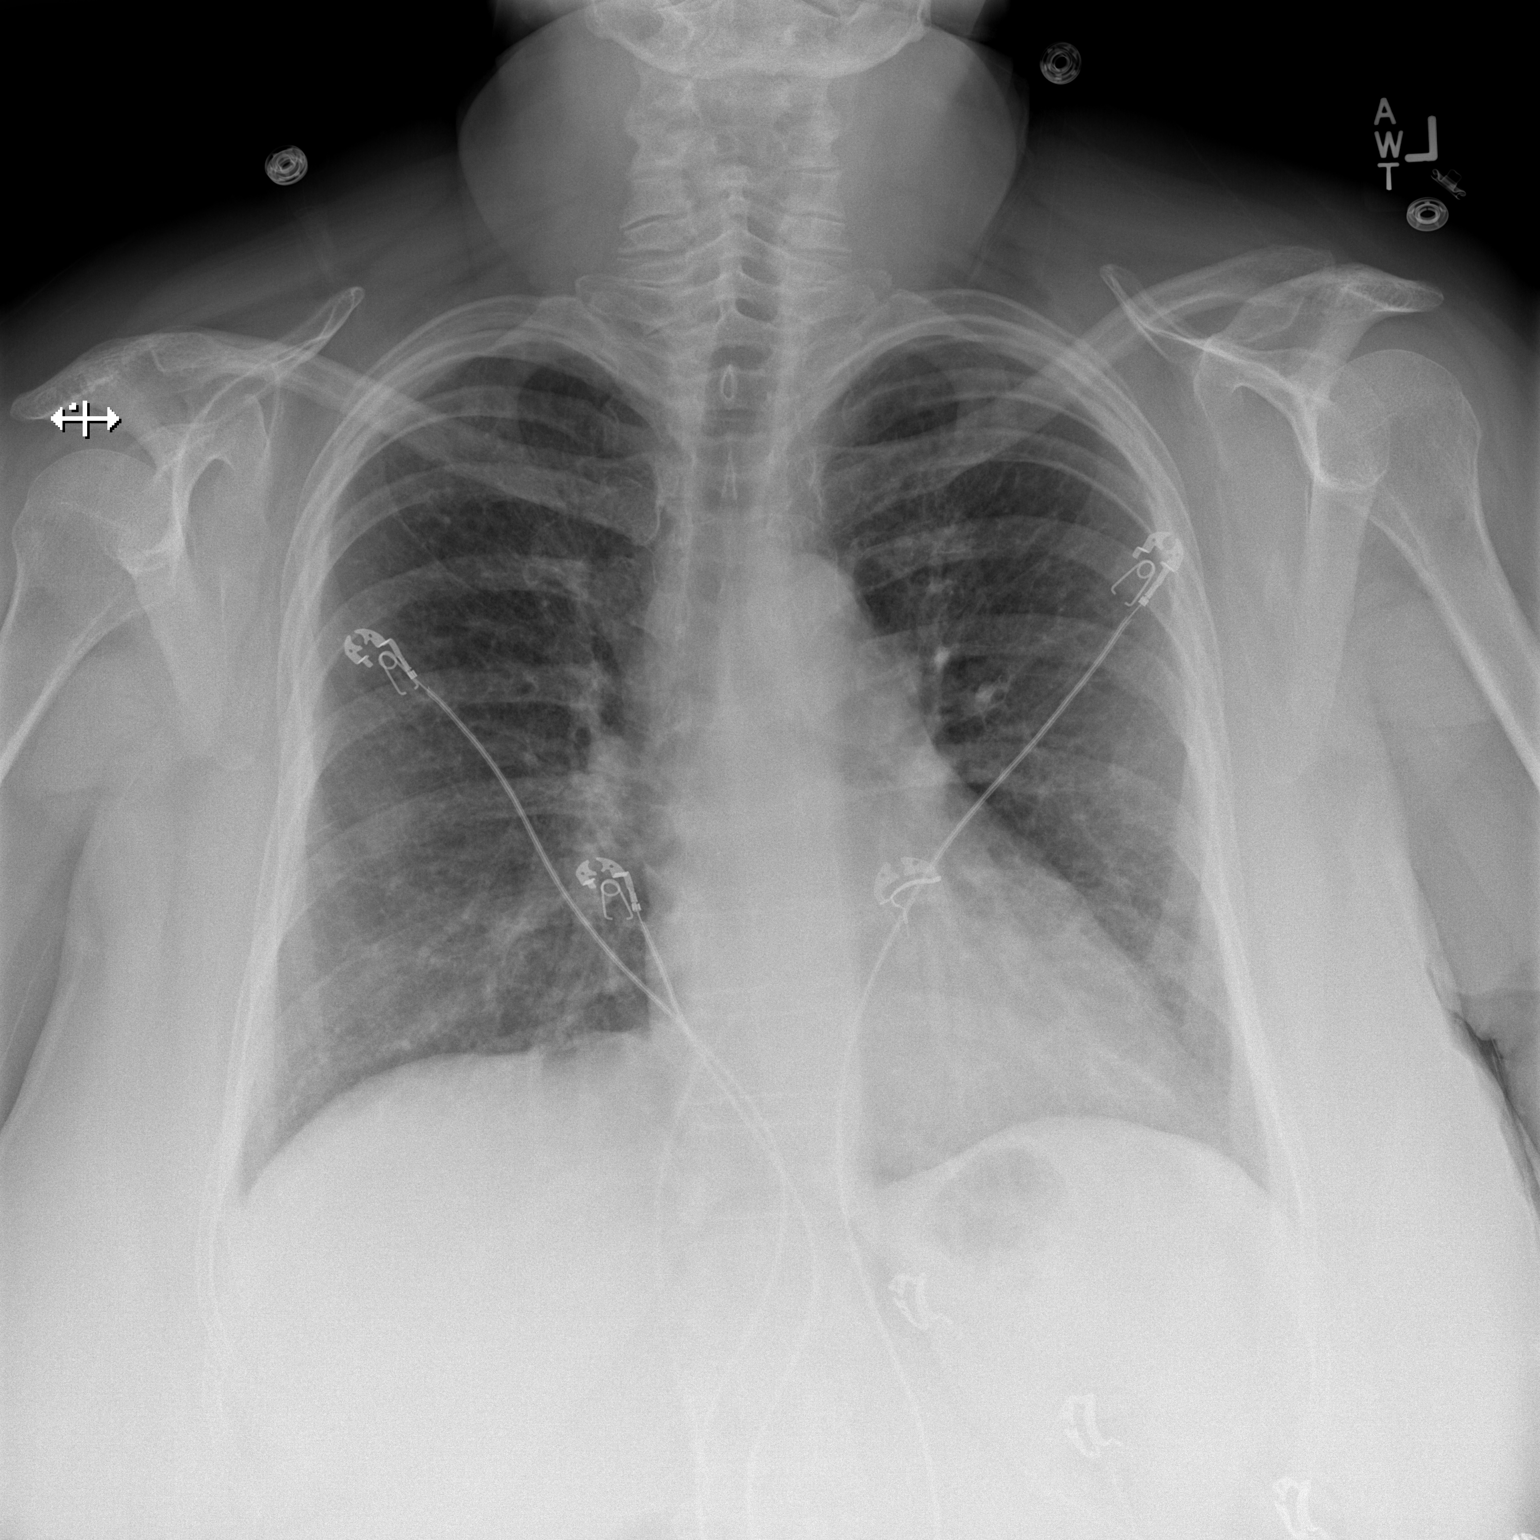

[w chest lat]
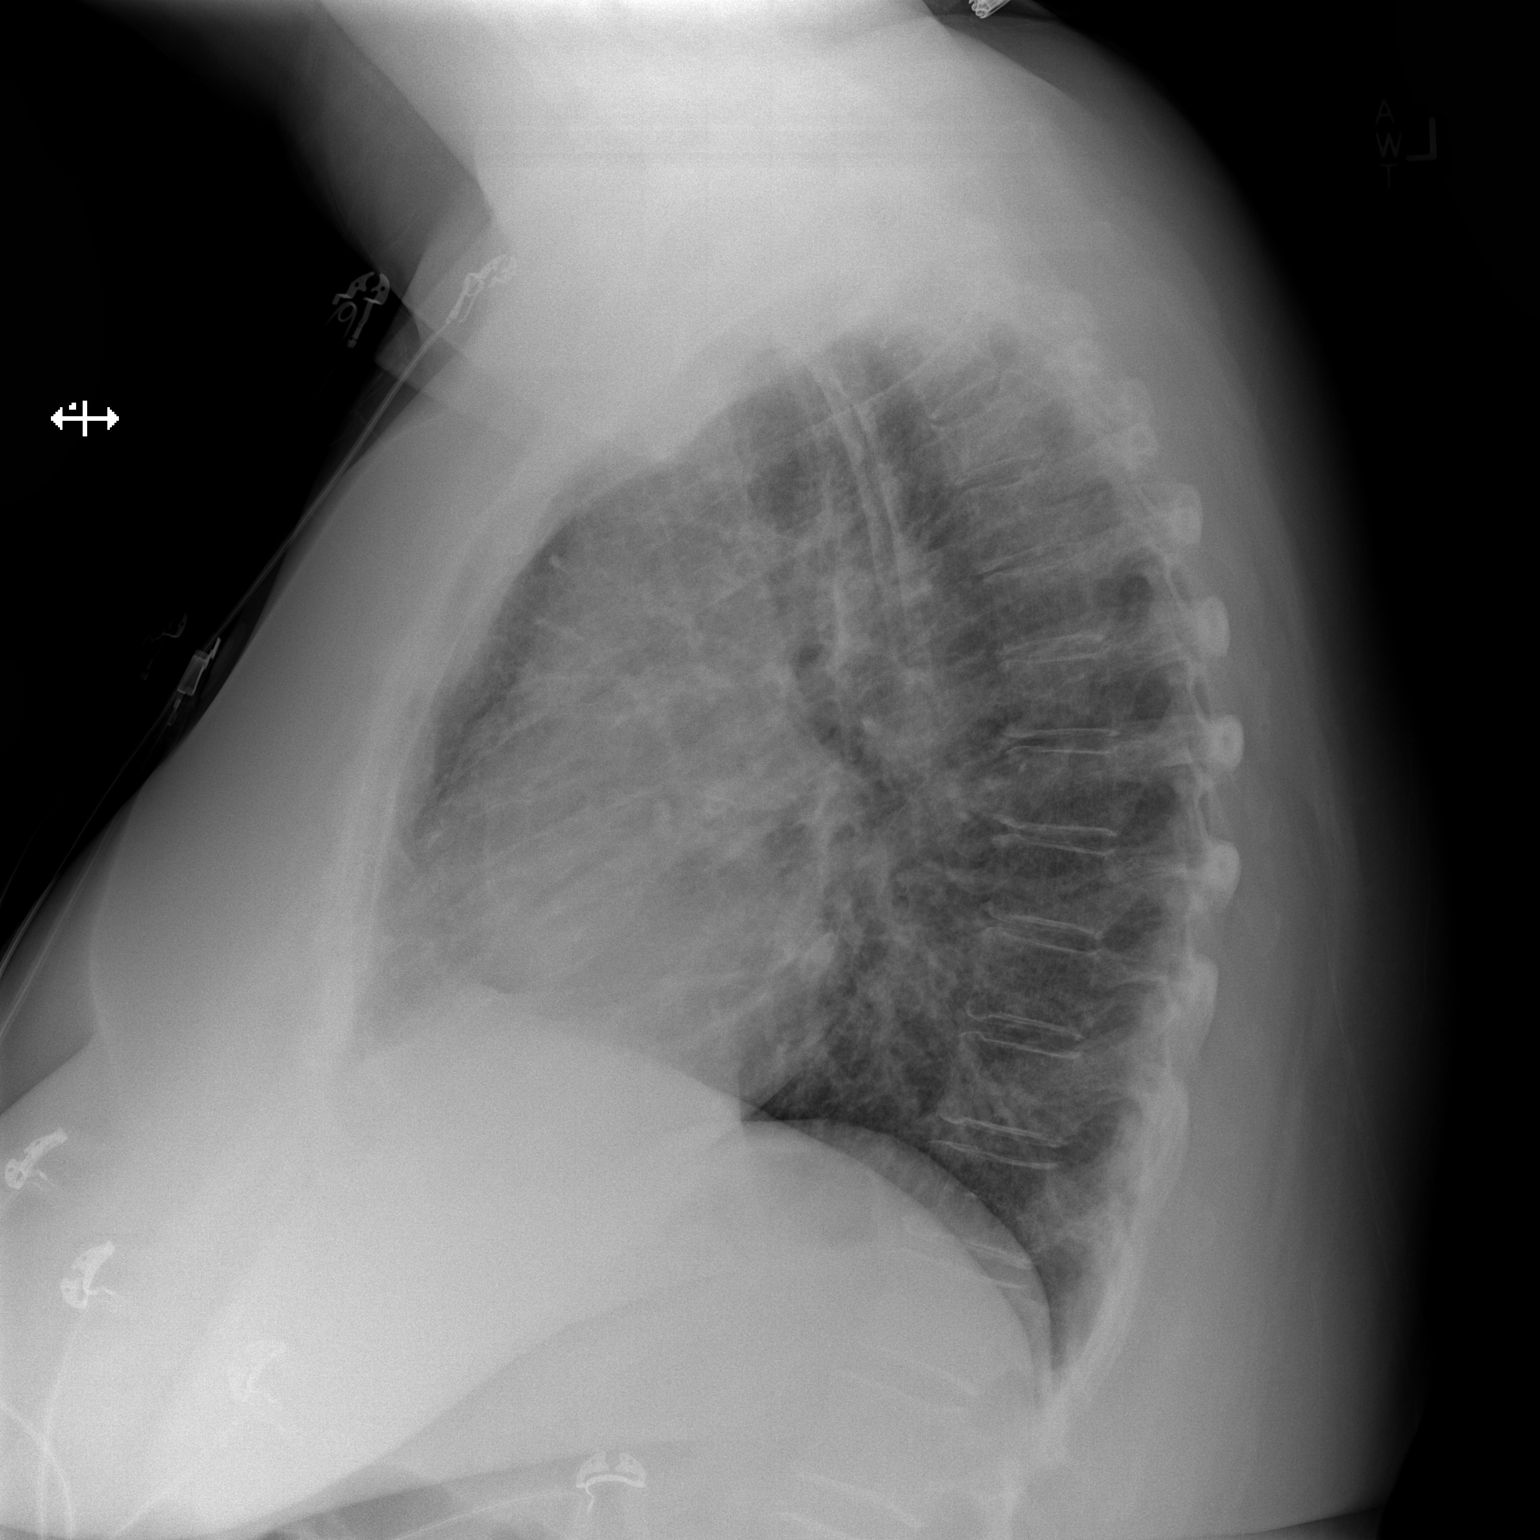

[2 of 2 positions shown; findings below may reference images not displayed]

FINDINGS: Cardiac shadow is within normal limits. The lungs are well aerated
bilaterally without focal infiltrate. Mild increased interstitial
changes are noted. No sizable effusion is identified. No bony
abnormality is seen.
IMPRESSION: Increased interstitial changes without focal infiltrate. Changes may
represent some degree of bronchitis.

## 2014-05-10 MED ORDER — IPRATROPIUM-ALBUTEROL 0.5-2.5 (3) MG/3ML IN SOLN
3.0000 mL | Freq: Once | RESPIRATORY_TRACT | Status: AC
  Administered 2014-05-10: 3 mL via RESPIRATORY_TRACT
  Filled 2014-05-10: qty 3

## 2014-05-10 MED ORDER — LEVOFLOXACIN 500 MG PO TABS: 500.0000 mg | ORAL_TABLET | Freq: Every day | ORAL | Status: DC

## 2014-05-10 MED ORDER — PREDNISONE 20 MG PO TABS
60.0000 mg | ORAL_TABLET | Freq: Once | ORAL | Status: AC
  Administered 2014-05-10: 60 mg via ORAL
  Filled 2014-05-10: qty 3

## 2014-05-10 MED ORDER — IPRATROPIUM-ALBUTEROL 0.5-2.5 (3) MG/3ML IN SOLN
3.0000 mL | Freq: Once | RESPIRATORY_TRACT | Status: AC
Start: 2014-05-10 — End: 2014-05-10
  Administered 2014-05-10: 3 mL via RESPIRATORY_TRACT
  Filled 2014-05-10: qty 3

## 2014-05-10 MED ORDER — PREDNISONE 20 MG PO TABS: 60.0000 mg | ORAL_TABLET | Freq: Once | ORAL | Status: DC

## 2014-05-10 NOTE — ED Notes (Signed)
Pt c/o left knee pain; swelling; pain with movement

## 2014-05-10 NOTE — ED Notes (Signed)
Unable to obtain E-signature. Will not work on computer in room or at desk. She verbalizes understanding of her d/c papers and prescriptions. She is ambulatory upon dc.

## 2014-05-10 NOTE — ED Provider Notes (Signed)
CSN: [id number]     Arrival date & time 05/10/14  1220 History   First MD Initiated Contact with Patient 05/10/14 1238     Chief Complaint  Patient presents with  . Chest Pain  . Shortness of Breath     (Consider location/radiation/quality/duration/timing/severity/associated sxs/prior Treatment) HPI  This is a 58 year old female with history of COPD who presents with concerns that she may have pneumonia. Patient reports that she was diagnosed with pneumonia one month ago. At that time she was placed on Levaquin and prednisone. Over the last several days she's had worsening congestion, productive cough, shortness of breath, and chest tightness. She states that the tightness is worse with coughing. She denies any fevers or chills. [facility] and referred to the ER. She states that her meds at home to help. She does not wear home oxygen.  Past Medical History  Diagnosis Date  . Allergy   . Anxiety   . Arthritis   . Depression   . Diabetes mellitus without complication   . HTN (hypertension) 01/12/2013   Past Surgical History  Procedure Laterality Date  . Tubal ligation     Family History  Problem Relation Age of Onset  . Diabetes Mother   . Alcohol abuse Father   . Diabetes Father    History  Substance Use Topics  . Smoking status: Current Every Day Smoker -- 1.00 packs/day    Types: Cigarettes  . Smokeless tobacco: Not on file  . Alcohol Use: Not on file   OB History   Grav Para Term Preterm Abortions TAB SAB Ect Mult Living                 Review of Systems  Constitutional: Negative for fever.  Respiratory: Positive for cough, chest tightness and shortness of breath.   Cardiovascular: Negative for chest pain and leg swelling.  Gastrointestinal: Negative for nausea, vomiting and abdominal pain.  Genitourinary: Negative for dysuria.  Musculoskeletal: Negative for back pain.  Skin: Negative for wound.  Neurological: Negative for headaches.   Psychiatric/Behavioral: Negative for confusion.  All other systems reviewed and are negative.     Allergies  Topamax  Home Medications   Prior to Admission medications   Medication Sig Start Date End Date Taking? Authorizing Provider  albuterol (PROVENTIL HFA;VENTOLIN HFA) 108 (90 BASE) MCG/ACT inhaler Inhale 2 puffs into the lungs every 6 (six) hours as needed for wheezing or shortness of breath.   Yes Historical Provider, MD  albuterol (PROVENTIL) (2.5 MG/3ML) 0.083% nebulizer solution Take 2.5 mg by nebulization every 6 (six) hours as needed for wheezing or shortness of breath.   Yes Historical Provider, MD  ALPRAZolam Duanne Moron) 0.5 MG tablet Take 1 tablet (0.5 mg total) by mouth 3 (three) times daily as needed for sleep. 01/12/13  Yes Theodis Blaze, MD  amphetamine-dextroamphetamine (ADDERALL) 20 MG tablet Take 1 tablet (20 mg total) by mouth 3 (three) times daily. 01/12/13  Yes Theodis Blaze, MD  atorvastatin (LIPITOR) 20 MG tablet Take 1 tablet (20 mg total) by mouth daily. 02/11/13  Yes Clanford Marisa Hua, MD  busPIRone (BUSPAR) 10 MG tablet Take 20 mg by mouth at bedtime.   Yes Historical Provider, MD  HYDROcodone-acetaminophen (NORCO/VICODIN) 5-325 MG per tablet Take 1 tablet by mouth every 8 (eight) hours as needed for pain. 02/11/13  Yes Clanford Marisa Hua, MD  lisinopril (ZESTRIL) 2.5 MG tablet Take 1 tablet (2.5 mg total) by mouth daily. 02/16/13  Yes Clanford Marisa Hua, MD  magnesium gluconate (MAGONATE) 500 MG tablet Take 1 tablet (500 mg total) by mouth 2 (two) times daily. 01/12/13  Yes Theodis Blaze, MD  omeprazole (PRILOSEC) 40 MG capsule Take 40 mg by mouth daily.   Yes Historical Provider, MD  pioglitazone (ACTOS) 30 MG tablet Take 1 tablet (30 mg total) by mouth daily. 02/11/13  Yes Clanford Marisa Hua, MD  Potassium Gluconate 550 MG TABS Take 1 tablet by mouth daily.   Yes Historical Provider, MD  pregabalin (LYRICA) 150 MG capsule Take 1 capsule (150 mg total) by mouth at  bedtime. 02/11/13  Yes Clanford Marisa Hua, MD  sertraline (ZOLOFT) 100 MG tablet Take 200 mg by mouth at bedtime.   Yes Historical Provider, MD  temazepam (RESTORIL) 15 MG capsule Take 1 capsule (15 mg total) by mouth at bedtime as needed for sleep. 01/12/13  Yes Theodis Blaze, MD  traMADol (ULTRAM) 50 MG tablet Take 1 tablet (50 mg total) by mouth every 8 (eight) hours as needed for pain. 03/19/13  Yes Robbie Lis, MD  levofloxacin (LEVAQUIN) 500 MG tablet Take 1 tablet (500 mg total) by mouth daily. 05/10/14   Merryl Hacker, MD  predniSONE (DELTASONE) 20 MG tablet Take 3 tablets (60 mg total) by mouth once. 05/10/14   Merryl Hacker, MD   BP 107/48  Pulse 108  Temp(Src) 97.8 F (36.6 C) (Oral)  Resp 18  SpO2 95% Physical Exam  Nursing note and vitals reviewed. Constitutional: She is oriented to person, place, and time.  Appears older than stated age, no acute distress  HENT:  Head: Normocephalic and atraumatic.  Eyes: Pupils are equal, round, and reactive to light.  Cardiovascular: Normal rate, regular rhythm and normal heart sounds.   No murmur heard. Pulmonary/Chest: Effort normal. No respiratory distress. She has wheezes.  Coarse breath sounds bilaterally  Abdominal: Soft. There is no tenderness.  Musculoskeletal:  Trace bilateral lower extremity edema  Neurological: She is alert and oriented to person, place, and time.  Skin: Skin is warm and dry.  Psychiatric: She has a normal mood and affect.    ED Course  Procedures (including critical care time) Labs Review Labs Reviewed  COMPREHENSIVE METABOLIC PANEL - Abnormal; Notable for the following:    Glucose, Bld 111 (*)    Total Bilirubin <0.2 (*)    GFR calc non Af Amer 58 (*)    GFR calc Af Amer 68 (*)    All other components within normal limits  CBC WITH DIFFERENTIAL    Imaging Review Dg Chest 2 View  05/10/2014   CLINICAL DATA:  Mid chest pain and shortness of breath history of recent pneumonia  EXAM:  CHEST  2 VIEW  COMPARISON:  09/28/2012  FINDINGS: Cardiac shadow is within normal limits. The lungs are well aerated bilaterally without focal infiltrate. Mild increased interstitial changes are noted. No sizable effusion is identified. No bony abnormality is seen.  IMPRESSION: Increased interstitial changes without focal infiltrate. Changes may represent some degree of bronchitis.   Electronically Signed   By: Inez Catalina M.D.   On: 05/10/2014 13:31       EKG Interpretation EKG independently reviewed by myself: Normal sinus rhythm with a rate of 94, no evidence of acute ST elevation or ischemia    MDM   Final diagnoses:  COPD (chronic obstructive pulmonary disease) with acute bronchitis    Patient presents with shortness of breath, productive cough, and chest pain.  History of COPD. She is wheezing on exam. She is satting 92% on room air. She was given a duo neb and prednisone. Basic labwork obtained and reassuring. No evidence of leukocytosis. Chest x-ray without infiltrate but suggestive of bronchitis. On repeat exam, patient reports improvement. She continues to have wheezing but this is improved as well. Repeat neb was ordered. Following this, patient was able to angulate and maintain her pulse ox. Suspect COPD exacerbation. Will again place on steroids and antibiotics for a COPD exacerbation.  After history, exam, and medical workup I feel the patient has been appropriately medically screened and is safe for discharge home. Pertinent diagnoses were discussed with the patient. Patient was given return precautions.     Merryl Hacker, MD 05/11/14 [phone]

## 2014-05-10 NOTE — ED Notes (Signed)
Pt seen at [facility] this morning and diagnosed with pneumonia and possibly pleurisy; has history of pneumonia from a month ago; symptoms returned last week; woke up this morning with wheezing; speaking in full sentences; states nebulizer that she uses four times a day isn't clearing up wheezing; states chest pain comes with movement; did not have chest xray this morning at [facility]

## 2014-05-10 NOTE — ED Notes (Signed)
MD at bedside. 

## 2014-05-10 NOTE — ED Notes (Addendum)
Pt alert and oriented upon dc. Patient breathing without difficulty sats 95% RA. She is calling her sister for a ride home. She verbalizes that she is leaving with ALL belongings she arrived with. She was told to follow up with PCP in 7 days if not better and to put ice on her knee.

## 2014-05-10 NOTE — ED Notes (Signed)
Pt ambulated without assistance. 92% on room air.

## 2014-05-10 NOTE — Discharge Instructions (Signed)

## 2015-01-04 ENCOUNTER — Ambulatory Visit (HOSPITAL_COMMUNITY)
Admission: RE | Admit: 2015-01-04 | Discharge: 2015-01-04 | Disposition: A | Discharge status: Home / Self Care | Source: Ambulatory Visit | Attending: Internal Medicine | Admitting: Internal Medicine

## 2015-01-04 ENCOUNTER — Other Ambulatory Visit (HOSPITAL_COMMUNITY): Payer: Self-pay | Admitting: Internal Medicine

## 2015-01-04 DIAGNOSIS — R079 Chest pain, unspecified: Secondary | ICD-10-CM | POA: Diagnosis present

## 2015-01-04 IMAGING — DX DG CHEST 2V
2 series · 2 of 2 positions shown · non-contrast
Comparison: [DATE].

CLINICAL DATA: Chest pain radiating to jaw for 2 months.

EXAM:
CHEST  2 VIEW

[chest pa]
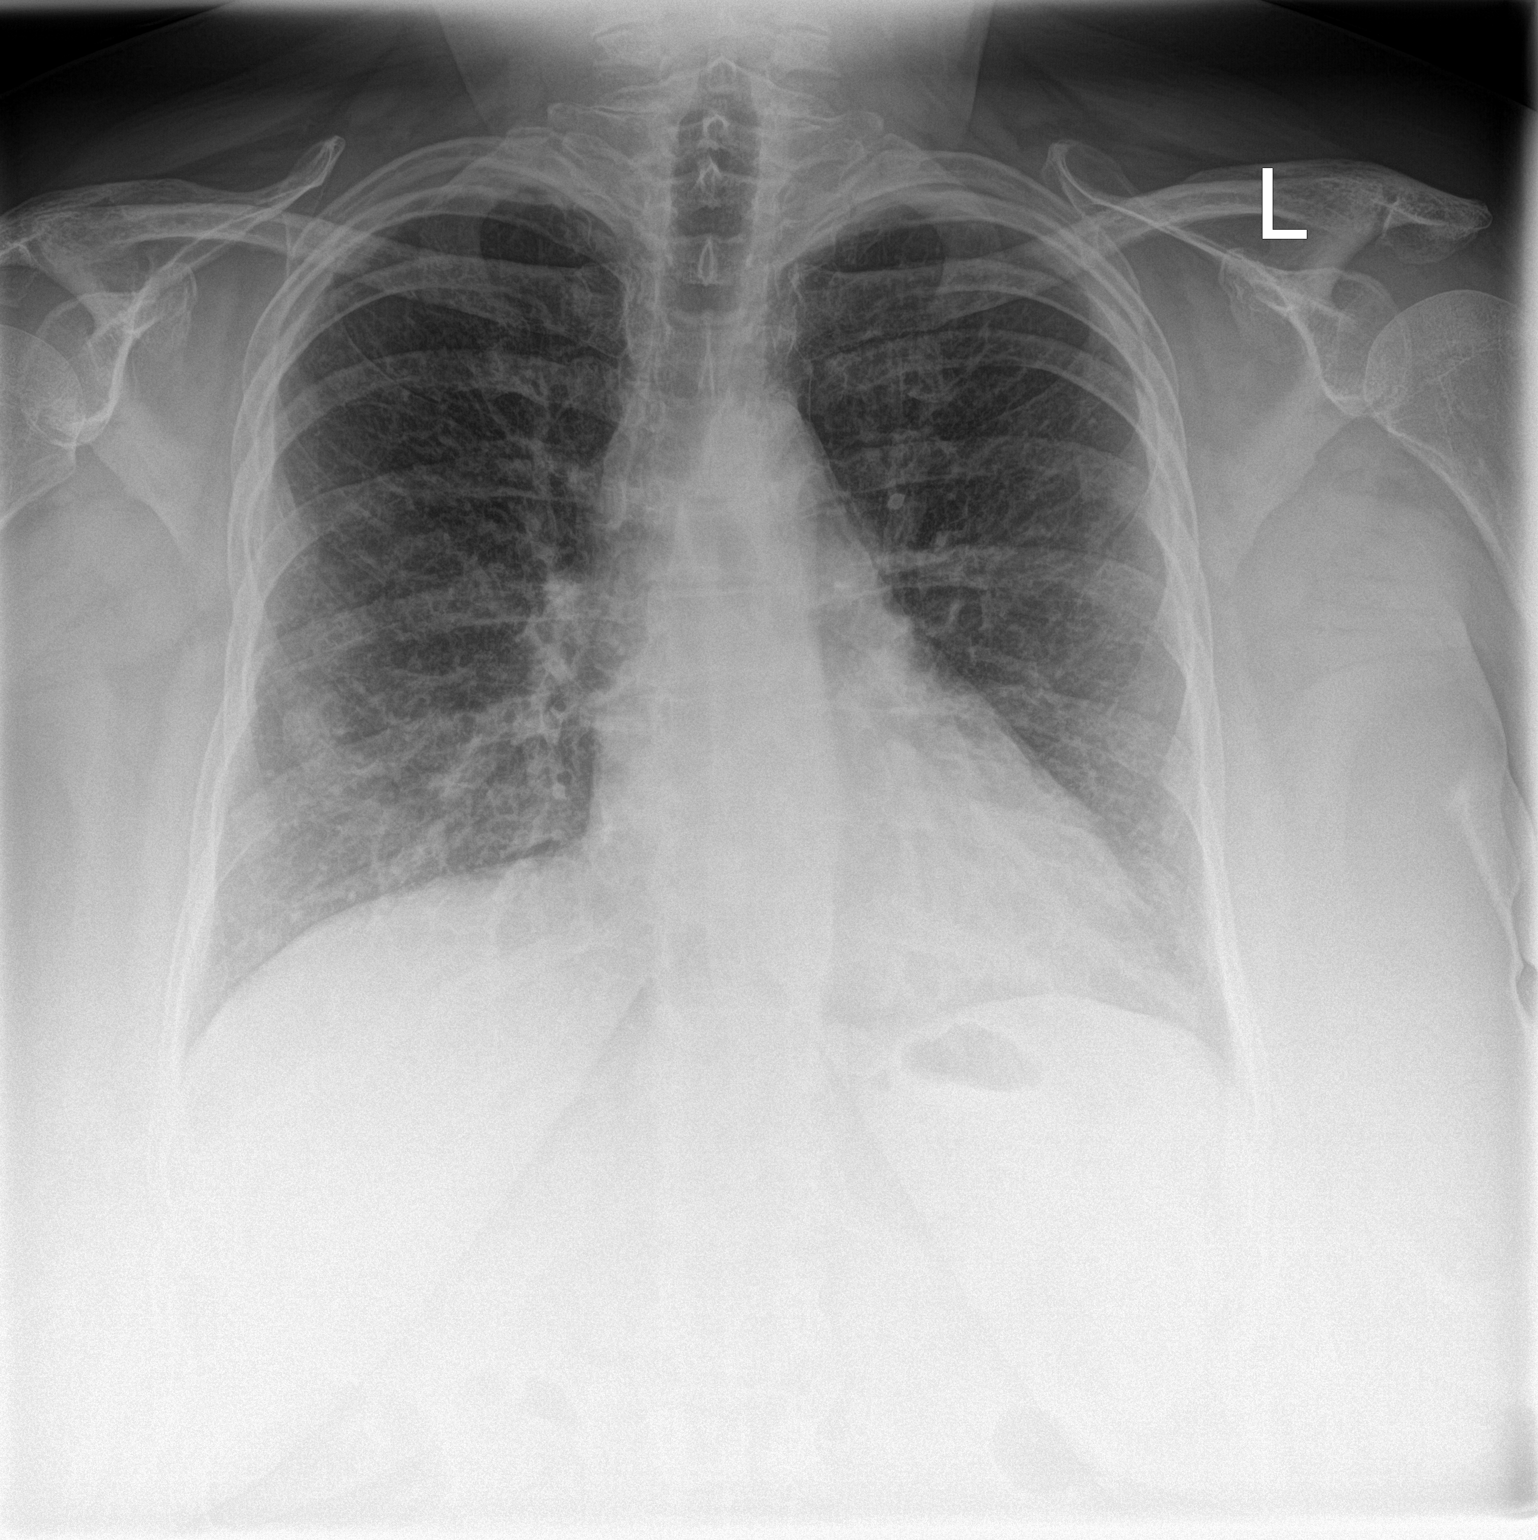

[chest lat]
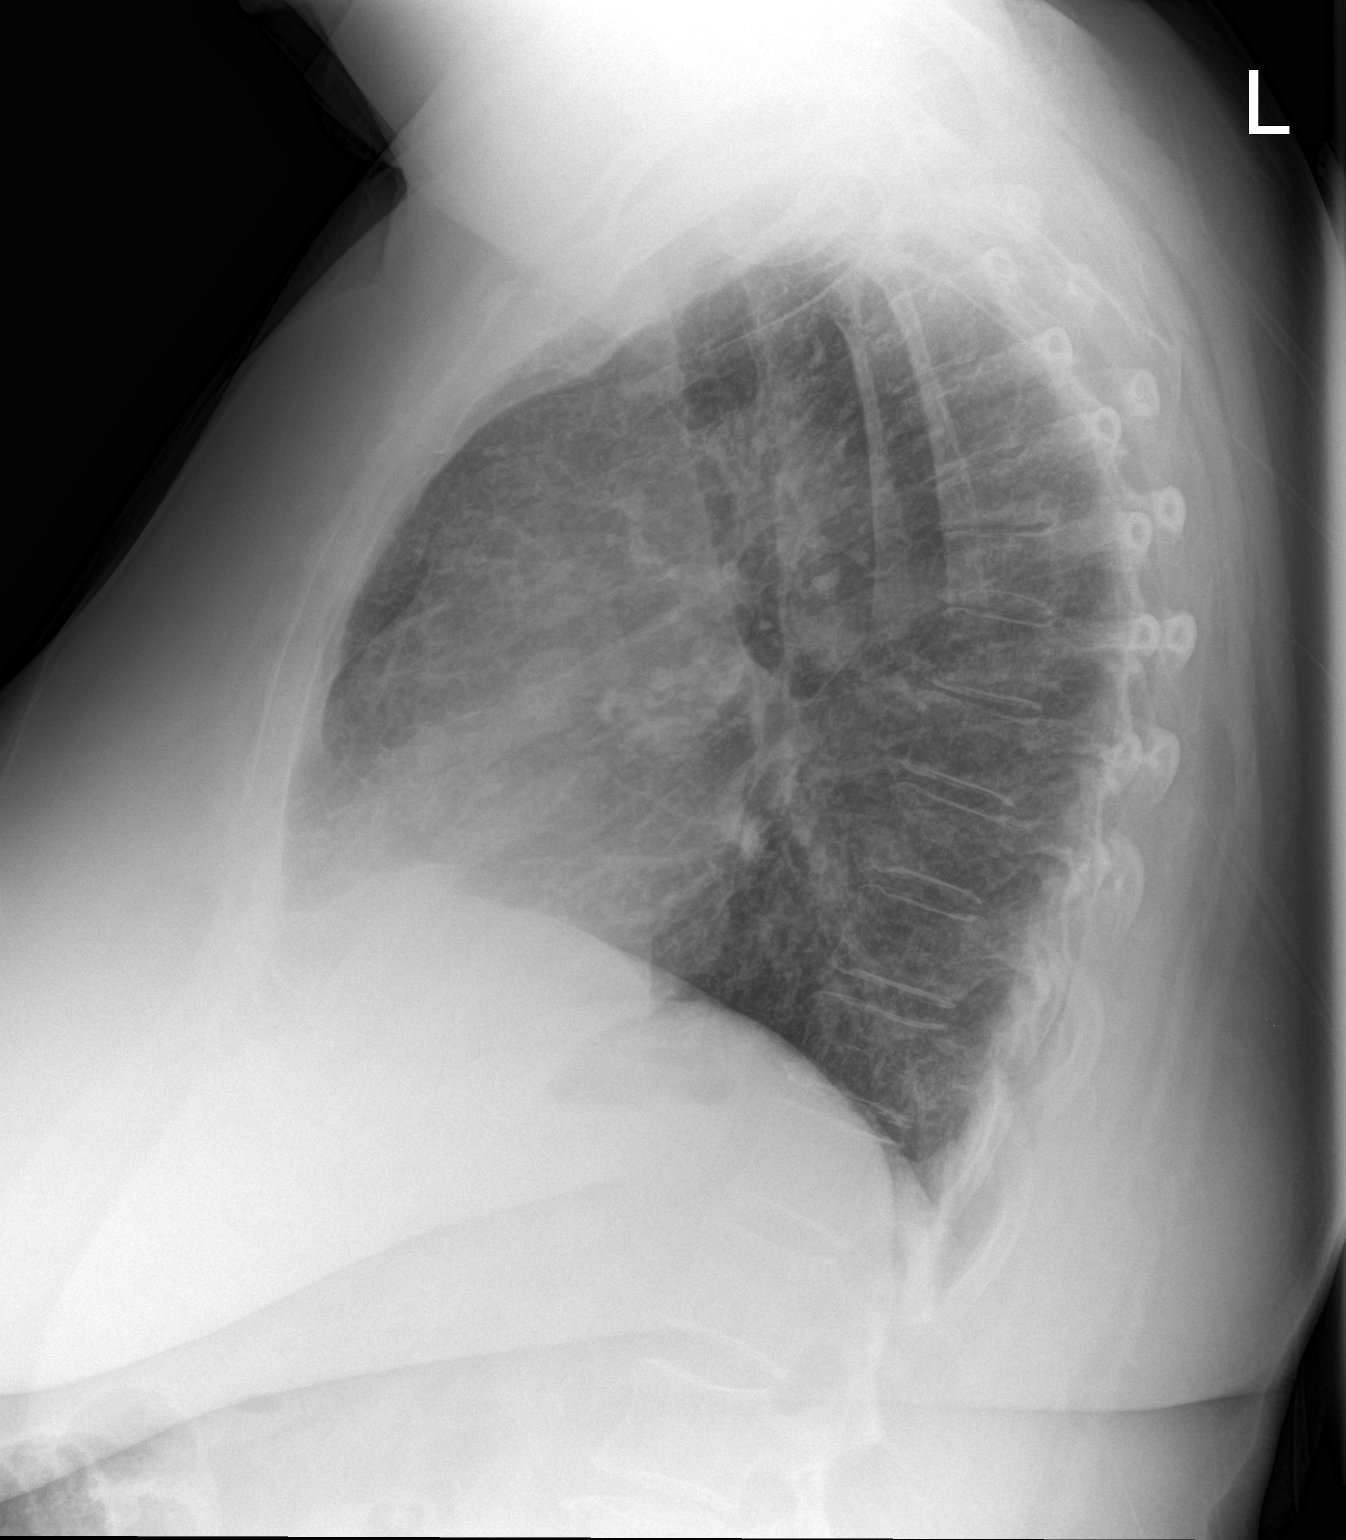

[2 of 2 positions shown; findings below may reference images not displayed]

FINDINGS: The heart size and mediastinal contours are within normal limits.
Stable interstitial densities are noted in both lung bases most
consistent with scarring. No acute pulmonary disease is noted. No
pneumothorax or pleural effusion is noted. The visualized skeletal
structures are unremarkable.
IMPRESSION: No active cardiopulmonary disease.

## 2015-01-09 ENCOUNTER — Emergency Department (HOSPITAL_COMMUNITY)
Admission: EM | Admit: 2015-01-09 | Discharge: 2015-01-09 | Disposition: A | Discharge status: Home / Self Care | Attending: Emergency Medicine | Admitting: Emergency Medicine

## 2015-01-09 ENCOUNTER — Emergency Department (HOSPITAL_COMMUNITY)

## 2015-01-09 ENCOUNTER — Encounter (HOSPITAL_COMMUNITY): Payer: Self-pay | Admitting: *Deleted

## 2015-01-09 DIAGNOSIS — X58XXXA Exposure to other specified factors, initial encounter: Secondary | ICD-10-CM | POA: Insufficient documentation

## 2015-01-09 DIAGNOSIS — Y9289 Other specified places as the place of occurrence of the external cause: Secondary | ICD-10-CM | POA: Insufficient documentation

## 2015-01-09 DIAGNOSIS — F329 Major depressive disorder, single episode, unspecified: Secondary | ICD-10-CM | POA: Diagnosis not present

## 2015-01-09 DIAGNOSIS — F419 Anxiety disorder, unspecified: Secondary | ICD-10-CM | POA: Insufficient documentation

## 2015-01-09 DIAGNOSIS — Z79899 Other long term (current) drug therapy: Secondary | ICD-10-CM | POA: Insufficient documentation

## 2015-01-09 DIAGNOSIS — I10 Essential (primary) hypertension: Secondary | ICD-10-CM | POA: Diagnosis not present

## 2015-01-09 DIAGNOSIS — S22030A Wedge compression fracture of third thoracic vertebra, initial encounter for closed fracture: Secondary | ICD-10-CM | POA: Insufficient documentation

## 2015-01-09 DIAGNOSIS — M1990 Unspecified osteoarthritis, unspecified site: Secondary | ICD-10-CM | POA: Diagnosis not present

## 2015-01-09 DIAGNOSIS — Z1231 Encounter for screening mammogram for malignant neoplasm of breast: Secondary | ICD-10-CM

## 2015-01-09 DIAGNOSIS — Z791 Long term (current) use of non-steroidal anti-inflammatories (NSAID): Secondary | ICD-10-CM | POA: Diagnosis not present

## 2015-01-09 DIAGNOSIS — Z720 Tobacco use: Secondary | ICD-10-CM | POA: Insufficient documentation

## 2015-01-09 DIAGNOSIS — S22050A Wedge compression fracture of T5-T6 vertebra, initial encounter for closed fracture: Secondary | ICD-10-CM | POA: Diagnosis not present

## 2015-01-09 DIAGNOSIS — E669 Obesity, unspecified: Secondary | ICD-10-CM | POA: Diagnosis not present

## 2015-01-09 DIAGNOSIS — Y998 Other external cause status: Secondary | ICD-10-CM | POA: Diagnosis not present

## 2015-01-09 DIAGNOSIS — S22000A Wedge compression fracture of unspecified thoracic vertebra, initial encounter for closed fracture: Secondary | ICD-10-CM

## 2015-01-09 DIAGNOSIS — S2231XA Fracture of one rib, right side, initial encounter for closed fracture: Secondary | ICD-10-CM

## 2015-01-09 DIAGNOSIS — S2241XA Multiple fractures of ribs, right side, initial encounter for closed fracture: Secondary | ICD-10-CM | POA: Insufficient documentation

## 2015-01-09 DIAGNOSIS — Y9389 Activity, other specified: Secondary | ICD-10-CM | POA: Diagnosis not present

## 2015-01-09 DIAGNOSIS — S299XXA Unspecified injury of thorax, initial encounter: Secondary | ICD-10-CM | POA: Diagnosis present

## 2015-01-09 DIAGNOSIS — S3992XA Unspecified injury of lower back, initial encounter: Secondary | ICD-10-CM

## 2015-01-09 DIAGNOSIS — E119 Type 2 diabetes mellitus without complications: Secondary | ICD-10-CM | POA: Insufficient documentation

## 2015-01-09 LAB — URINALYSIS, ROUTINE W REFLEX MICROSCOPIC
Bilirubin Urine: NEGATIVE
Glucose, UA: NEGATIVE mg/dL
Hgb urine dipstick: NEGATIVE
KETONES UR: NEGATIVE mg/dL
LEUKOCYTES UA: NEGATIVE
NITRITE: NEGATIVE
PROTEIN: NEGATIVE mg/dL
Specific Gravity, Urine: 1.008 (ref 1.005–1.030)
UROBILINOGEN UA: 0.2 mg/dL (ref 0.0–1.0)
pH: 6 (ref 5.0–8.0)
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
## 2015-01-09 NOTE — ED Provider Notes (Signed)
CSN: [id number]     Arrival date & time 01/09/15  1150 History   First MD Initiated Contact with Patient 01/09/15 1507     Chief Complaint  Patient presents with  . Back Pain  . Abdominal Pain     (Consider location/radiation/quality/duration/timing/severity/associated sxs/prior Treatment) HPI   Theresa Fernandez is a 59 y.o. female who presents for evaluation of right upper back pain present for 2 months, worsening over the last week. The pain radiates to her central chest, bilaterally. She has been using hydrocodone, without relief of the pain. She denies cough, shortness of breath, fever, chills, weakness or dizziness. No nausea, vomiting, new lower back pain, or difficulty walking. She is taking her usual medications. There are no other known modifying factors.   Past Medical History  Diagnosis Date  . Allergy   . Anxiety   . Arthritis   . Depression   . Diabetes mellitus without complication   . HTN (hypertension) 01/12/2013   Past Surgical History  Procedure Laterality Date  . Tubal ligation     Family History  Problem Relation Age of Onset  . Diabetes Mother   . Alcohol abuse Father   . Diabetes Father    History  Substance Use Topics  . Smoking status: Current Every Day Smoker -- 1.00 packs/day    Types: Cigarettes  . Smokeless tobacco: Not on file  . Alcohol Use: Not on file   OB History    No data available     Review of Systems  All other systems reviewed and are negative.     Allergies  Topamax  Home Medications   Prior to Admission medications   Medication Sig Start Date End Date Taking? Authorizing Provider  albuterol (PROVENTIL HFA;VENTOLIN HFA) 108 (90 BASE) MCG/ACT inhaler Inhale 2 puffs into the lungs every 6 (six) hours as needed for wheezing or shortness of breath (wheezing).    Yes Historical Provider, MD  albuterol (PROVENTIL) (2.5 MG/3ML) 0.083% nebulizer solution Take 2.5 mg by nebulization every 6 (six) hours as needed for wheezing or  shortness of breath (wheezing).    Yes Historical Provider, MD  ALPRAZolam Duanne Moron) 0.5 MG tablet Take 1 tablet (0.5 mg total) by mouth 3 (three) times daily as needed for sleep. 01/12/13  Yes Theodis Blaze, MD  amphetamine-dextroamphetamine (ADDERALL) 20 MG tablet Take 1 tablet (20 mg total) by mouth 3 (three) times daily. Patient taking differently: Take 30 mg by mouth 2 (two) times daily.  01/12/13  Yes Theodis Blaze, MD  atorvastatin (LIPITOR) 20 MG tablet Take 1 tablet (20 mg total) by mouth daily. 02/11/13  Yes Clanford Marisa Hua, MD  busPIRone (BUSPAR) 10 MG tablet Take 10 mg by mouth 2 (two) times daily.    Yes Historical Provider, MD  HYDROcodone-acetaminophen (NORCO/VICODIN) 5-325 MG per tablet Take 1 tablet by mouth every 8 (eight) hours as needed for pain. 02/11/13  Yes Clanford Marisa Hua, MD  ibuprofen (ADVIL,MOTRIN) 200 MG tablet Take 800 mg by mouth every 6 (six) hours as needed for moderate pain (pain).   Yes Historical Provider, MD  lisinopril (ZESTRIL) 2.5 MG tablet Take 1 tablet (2.5 mg total) by mouth daily. 02/16/13  Yes Clanford Marisa Hua, MD  lovastatin (MEVACOR) 20 MG tablet Take 20 mg by mouth at bedtime.   Yes Historical Provider, MD  magnesium gluconate (MAGONATE) 500 MG tablet Take 1 tablet (500 mg total) by mouth 2 (two) times daily. 01/12/13  Yes Theodis Blaze, MD  meloxicam (  MOBIC) 7.5 MG tablet Take 7.5 mg by mouth 2 (two) times daily.   Yes Historical Provider, MD  omeprazole (PRILOSEC) 40 MG capsule Take 40 mg by mouth daily.   Yes Historical Provider, MD  phentermine 37.5 MG capsule Take 37.5 mg by mouth every morning.   Yes Historical Provider, MD  pioglitazone (ACTOS) 30 MG tablet Take 1 tablet (30 mg total) by mouth daily. 02/11/13  Yes Clanford Marisa Hua, MD  pregabalin (LYRICA) 150 MG capsule Take 1 capsule (150 mg total) by mouth at bedtime. 02/11/13  Yes Clanford Marisa Hua, MD  sertraline (ZOLOFT) 100 MG tablet Take 200 mg by mouth at bedtime.   Yes Historical  Provider, MD  levofloxacin (LEVAQUIN) 500 MG tablet Take 1 tablet (500 mg total) by mouth daily. Patient not taking: Reported on 01/09/2015 05/10/14   Merryl Hacker, MD  predniSONE (DELTASONE) 20 MG tablet Take 3 tablets (60 mg total) by mouth once. Patient not taking: Reported on 01/09/2015 05/10/14   Merryl Hacker, MD  temazepam (RESTORIL) 15 MG capsule Take 1 capsule (15 mg total) by mouth at bedtime as needed for sleep. Patient not taking: Reported on 01/09/2015 01/12/13   Theodis Blaze, MD  traMADol (ULTRAM) 50 MG tablet Take 1 tablet (50 mg total) by mouth every 8 (eight) hours as needed for pain. Patient not taking: Reported on 01/09/2015 03/19/13   Robbie Lis, MD   BP 132/71 mmHg  Pulse 68  Temp(Src) 98.5 F (36.9 C) (Oral)  Resp 18  SpO2 97% Physical Exam  Constitutional: She is oriented to person, place, and time. She appears well-developed.  Obese  HENT:  Head: Normocephalic and atraumatic.  Right Ear: External ear normal.  Left Ear: External ear normal.  Eyes: Conjunctivae and EOM are normal. Pupils are equal, round, and reactive to light.  Neck: Normal range of motion and phonation normal. Neck supple.  Cardiovascular: Normal rate, regular rhythm and normal heart sounds.   Pulmonary/Chest: Effort normal and breath sounds normal. She exhibits no bony tenderness.  Abdominal: Soft. There is no tenderness.  Musculoskeletal: Normal range of motion.  Tender right paravertebral thoracic musculature without crepitation or deformity. No tenderness of the cervical, thoracic or lumbar spine regions. Normal range of motion, arms and legs bilaterally.  Neurological: She is alert and oriented to person, place, and time. No cranial nerve deficit or sensory deficit. She exhibits normal muscle tone. Coordination normal.  Skin: Skin is warm, dry and intact.  Psychiatric: She has a normal mood and affect. Her behavior is normal. Judgment and thought content normal.  Nursing note and  vitals reviewed.   ED Course  Procedures (including critical care time)  Medications - No data to display  Patient Vitals for the past 24 hrs:  BP Temp Temp src Pulse Resp SpO2  01/09/15 1723 132/71 mmHg 98.5 F (36.9 C) Oral 68 18 97 %  01/09/15 1507 112/63 mmHg 98.6 F (37 C) Oral 81 18 96 %  01/09/15 1159 126/72 mmHg 98.6 F (37 C) Oral 100 19 99 %    17:40 Reevaluation with update and discussion. After initial assessment and treatment, an updated evaluation reveals no change in clinical status. Findings discussed with the patient, all questions answered. Zarius Furr L   Labs Review Labs Reviewed  URINALYSIS, ROUTINE W REFLEX MICROSCOPIC (NOT AT [facility])    Imaging Review Dg Ribs Unilateral Right  01/09/2015   CLINICAL DATA:  Bent over and injured back 2 months ago with right-sided  axillary rib pain. Initial encounter.  EXAM: RIGHT RIBS - 2 VIEW  COMPARISON:  01/04/2015  FINDINGS: There are lateral right fifth and sixth rib fractures with up to moderate displacement. The fifth rib fracture is segmental. Fracture lines are still readily visible, but there is callus. No evidence of hemothorax or pneumothorax.  Stable heart size and mediastinal contours.  IMPRESSION: Right fifth and sixth rib fractures, likely subacute.   Electronically Signed   By: Monte Fantasia M.D.   On: 01/09/2015 16:21   Dg Thoracic Spine 2 View  01/09/2015   CLINICAL DATA:  Bending injury 2 months ago with persistent back pain  EXAM: THORACIC SPINE - 2 VIEW  COMPARISON:  None.  FINDINGS: The pedicles are within normal limits. A few mid thoracic vertebral compression deformities are noted at T5 and T6. A compression deformity is also noted at T3. These are stable from the prior exam but new from 05/10/2014. No other focal abnormality is seen.  IMPRESSION: Compression deformities at T3, T5 and T6 these are new from 05/10/2014. MRI may be helpful for further evaluation as to the degree of chronicity.    Electronically Signed   By: Inez Catalina M.D.   On: 01/09/2015 16:20     EKG Interpretation None      MDM   Final diagnoses:  Back injury  Fracture of rib of right side, closed, initial encounter  Thoracic compression fracture, closed, initial encounter     Subacute fractures right ribs and thoracic spine vertebrae. No evidence for spinal myelopathy, acute pulmonary abnormality or uncontrollable pain.  Nursing Notes Reviewed/ Care Coordinated Applicable Imaging Reviewed Interpretation of Laboratory Data incorporated into ED treatment  The patient appears reasonably screened and/or stabilized for discharge and I doubt any other medical condition or other [facility] requiring further screening, evaluation, or treatment in the ED at this time prior to discharge.  Plan: Home Medications- usual; Home Treatments- rest; return here if the recommended treatment, does not improve the symptoms; Recommended follow up- PCP prn. Consider evaluation for osteoporosis     Daleen Bo, MD 01/09/15 [id number]

## 2015-01-09 NOTE — ED Notes (Signed)
Pt complains of upper back pain for the past 2 months, RUQ abdominal pain for the past 2 weeks. Pt states the pain is continuous, is worse with movement. Pt takes hydrocodone for chronic lower back pain, states it has not provided relief.

## 2015-01-09 NOTE — Discharge Instructions (Signed)
The x-rays indicate that you have at least 2 rib fractures on the right. There are also some vertebral body compression fractures in the thoracic spine. It is unclear if the vertebral fractures are new or old.   Rib Fracture A rib fracture is a break or crack in one of the bones of the ribs. The ribs are a group of long, curved bones that wrap around your chest and attach to your spine. They protect your lungs and other organs in the chest cavity. A broken or cracked rib is often painful, but most do not cause other problems. Most rib fractures heal on their own over time. However, rib fractures can be more serious if multiple ribs are broken or if broken ribs move out of place and push against other structures. CAUSES   A direct blow to the chest. For example, this could happen during contact sports, a car accident, or a fall against a hard object.  Repetitive movements with high force, such as pitching a baseball or having severe coughing spells. SYMPTOMS   Pain when you breathe in or cough.  Pain when someone presses on the injured area. DIAGNOSIS  Your caregiver will perform a physical exam. Various imaging tests may be ordered to confirm the diagnosis and to look for related injuries. These tests may include a chest X-ray, computed tomography (CT), magnetic resonance imaging (MRI), or a bone scan. TREATMENT  Rib fractures usually heal on their own in 1-3 months. The longer healing period is often associated with a continued cough or other aggravating activities. During the healing period, pain control is very important. Medication is usually given to control pain. Hospitalization or surgery may be needed for more severe injuries, such as those in which multiple ribs are broken or the ribs have moved out of place.  HOME CARE INSTRUCTIONS   Avoid strenuous activity and any activities or movements that cause pain. Be careful during activities and avoid bumping the injured rib.  Gradually  increase activity as directed by your caregiver.  Only take over-the-counter or prescription medications as directed by your caregiver. Do not take other medications without asking your caregiver first.  Apply ice to the injured area for the first 1-2 days after you have been treated or as directed by your caregiver. Applying ice helps to reduce inflammation and pain.  Put ice in a plastic bag.  Place a towel between your skin and the bag.   Leave the ice on for 15-20 minutes at a time, every 2 hours while you are awake.  Perform deep breathing as directed by your caregiver. This will help prevent pneumonia, which is a common complication of a broken rib. Your caregiver may instruct you to:  Take deep breaths several times a day.  Try to cough several times a day, holding a pillow against the injured area.  Use a device called an incentive spirometer to practice deep breathing several times a day.  Drink enough fluids to keep your urine clear or pale yellow. This will help you avoid constipation.   Do not wear a rib belt or binder. These restrict breathing, which can lead to pneumonia.  SEEK IMMEDIATE MEDICAL CARE IF:   You have a fever.   You have difficulty breathing or shortness of breath.   You develop a continual cough, or you cough up thick or bloody sputum.  You feel sick to your stomach (nausea), throw up (vomit), or have abdominal pain.   You have worsening pain not  controlled with medications.  MAKE SURE YOU:  Understand these instructions.  Will watch your condition.  Will get help right away if you are not doing well or get worse. Document Released: 07/08/2005 Document Revised: 03/10/2013 Document Reviewed: 09/09/2012 [facility] Patient Information 2015 [place], [place]. This information is not intended to replace advice given to you by your health care provider. Make sure you discuss any questions you have with your health care provider.  Vertebral  Fracture You have a fracture of one or more vertebra. These are the bony parts that form the spine. Minor vertebral fractures happen when people fall. Osteoporosis is associated with many of these fractures. Hospital care may not be necessary for minor compression fractures that are stable. However, multiple fractures of the spine or unstable injuries can cause severe pain and even damage the spinal cord. A spinal cord injury may cause paralysis, numbness, or loss of normal bowel and bladder control.  Normally there is pain and stiffness in the back for 3 to 6 weeks after a vertebral fracture. Bed rest for several days, pain medicine, and a slow return to activity is often the only treatment that is needed depending on the location of the fracture. Neck and back braces may be helpful in reducing pain and increasing mobility. When your pain allows, you should begin walking or swimming to help maintain your endurance. Exercises to improve motion and to strengthen the back may also be useful after the initial pain improves. Treatment for osteoporosis may be essential for full recovery. This will help reduce your risk of vertebral fractures with a future fall. During the first few days after a spine fracture you may feel nauseated or vomit. If this is severe, hospital care with IV fluids will be needed.  Arrange for follow-up care as recommended to assure proper long-term care and prevention of further spine injury.  SEEK IMMEDIATE MEDICAL CARE IF:  You have increasing pain, vomiting, or are unable to move around at all.  You develop numbness, tingling, weakness, or paralysis of any part of your body.  You develop a loss of normal bowel or bladder control.  You have difficulty breathing, cough, fever, chest or abdominal pain. MAKE SURE YOU:   Understand these instructions.  Will watch your condition.  Will get help right away if you are not doing well or get worse. Document Released: 08/15/2004  Document Revised: 09/30/2011 Document Reviewed: 02/28/2009 [facility] Patient Information 2015 [place], [place]. This information is not intended to replace advice given to you by your health care provider. Make sure you discuss any questions you have with your health care provider.

## 2015-01-30 ENCOUNTER — Other Ambulatory Visit: Payer: Self-pay | Admitting: Internal Medicine

## 2015-01-30 DIAGNOSIS — Z780 Asymptomatic menopausal state: Secondary | ICD-10-CM

## 2015-03-01 ENCOUNTER — Inpatient Hospital Stay
Admission: RE | Admit: 2015-03-01 | Discharge: 2015-03-01 | Disposition: A | Discharge status: Home / Self Care | Source: Ambulatory Visit | Attending: Internal Medicine | Admitting: Internal Medicine

## 2015-03-06 ENCOUNTER — Encounter (HOSPITAL_COMMUNITY): Payer: Self-pay

## 2015-03-06 ENCOUNTER — Emergency Department (HOSPITAL_COMMUNITY)
Admission: EM | Admit: 2015-03-06 | Discharge: 2015-03-06 | Disposition: A | Discharge status: Home / Self Care | Attending: Emergency Medicine | Admitting: Emergency Medicine

## 2015-03-06 ENCOUNTER — Emergency Department (HOSPITAL_COMMUNITY)

## 2015-03-06 DIAGNOSIS — IMO0002 Reserved for concepts with insufficient information to code with codable children: Secondary | ICD-10-CM

## 2015-03-06 DIAGNOSIS — I10 Essential (primary) hypertension: Secondary | ICD-10-CM | POA: Diagnosis not present

## 2015-03-06 DIAGNOSIS — M1990 Unspecified osteoarthritis, unspecified site: Secondary | ICD-10-CM | POA: Insufficient documentation

## 2015-03-06 DIAGNOSIS — F419 Anxiety disorder, unspecified: Secondary | ICD-10-CM | POA: Diagnosis not present

## 2015-03-06 DIAGNOSIS — Z720 Tobacco use: Secondary | ICD-10-CM | POA: Insufficient documentation

## 2015-03-06 DIAGNOSIS — F329 Major depressive disorder, single episode, unspecified: Secondary | ICD-10-CM | POA: Diagnosis not present

## 2015-03-06 DIAGNOSIS — M8448XA Pathological fracture, other site, initial encounter for fracture: Secondary | ICD-10-CM | POA: Insufficient documentation

## 2015-03-06 DIAGNOSIS — Z791 Long term (current) use of non-steroidal anti-inflammatories (NSAID): Secondary | ICD-10-CM | POA: Diagnosis not present

## 2015-03-06 DIAGNOSIS — Z79899 Other long term (current) drug therapy: Secondary | ICD-10-CM | POA: Diagnosis not present

## 2015-03-06 DIAGNOSIS — M545 Low back pain: Secondary | ICD-10-CM | POA: Diagnosis present

## 2015-03-06 LAB — URINALYSIS, ROUTINE W REFLEX MICROSCOPIC
BILIRUBIN URINE: NEGATIVE
Glucose, UA: NEGATIVE mg/dL
Hgb urine dipstick: NEGATIVE
KETONES UR: NEGATIVE mg/dL
LEUKOCYTES UA: NEGATIVE
NITRITE: NEGATIVE
PROTEIN: NEGATIVE mg/dL
Specific Gravity, Urine: 1.012 (ref 1.005–1.030)
Urobilinogen, UA: 0.2 mg/dL (ref 0.0–1.0)
pH: 6 (ref 5.0–8.0)

## 2015-03-06 IMAGING — CR DG LUMBAR SPINE COMPLETE 4+V
5 series · 5 of 5 positions shown · non-contrast
Comparison: Lumbar spine series of [DATE]

CLINICAL DATA: New onset of mid back pain yesterday without known
trauma

EXAM:
LUMBAR SPINE - COMPLETE 4+ VIEW

[t lumbar spine ap]
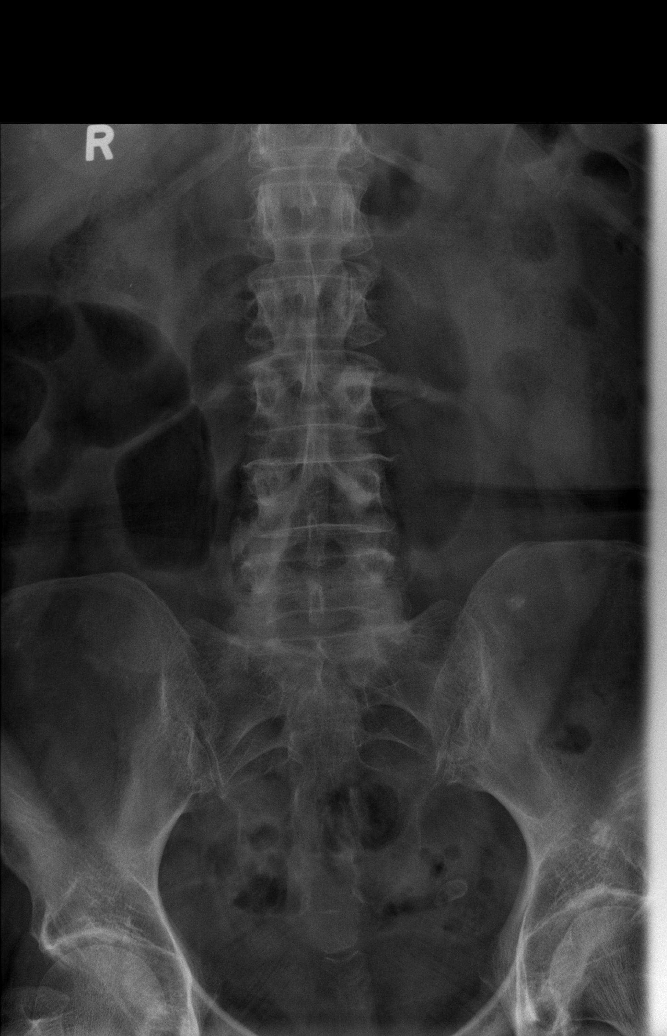

[t lumbar spine obl (1 of 2)]
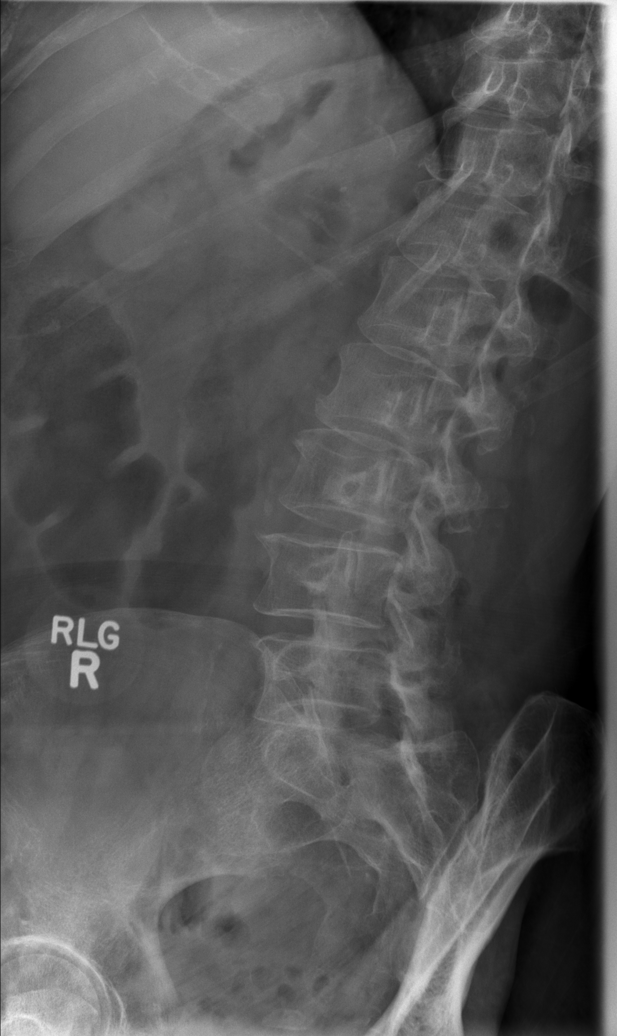

[t lumbar spine obl (2 of 2)]
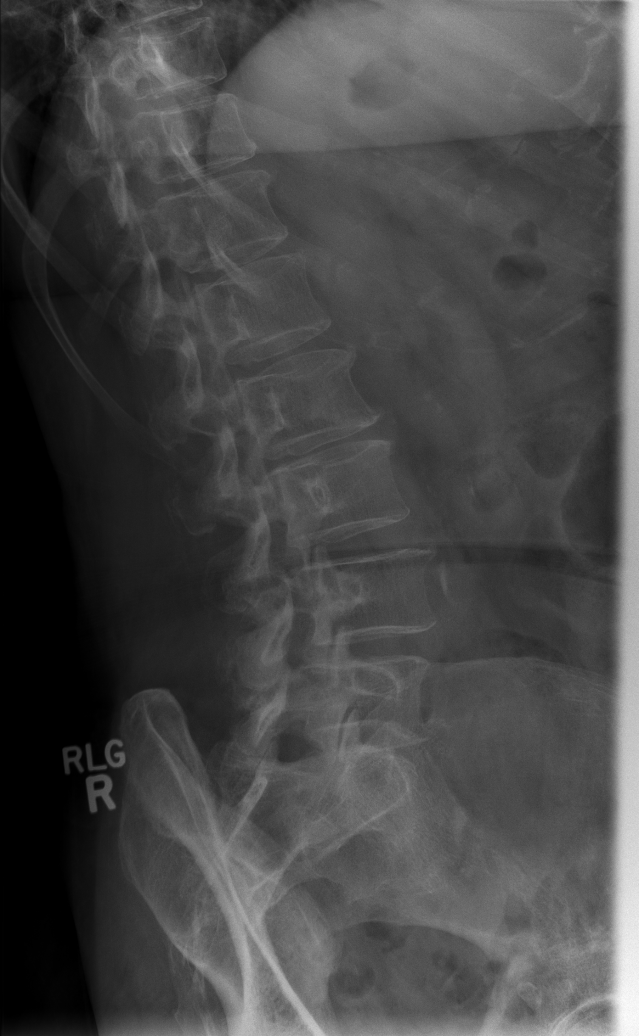

[t lumbar spine lat]
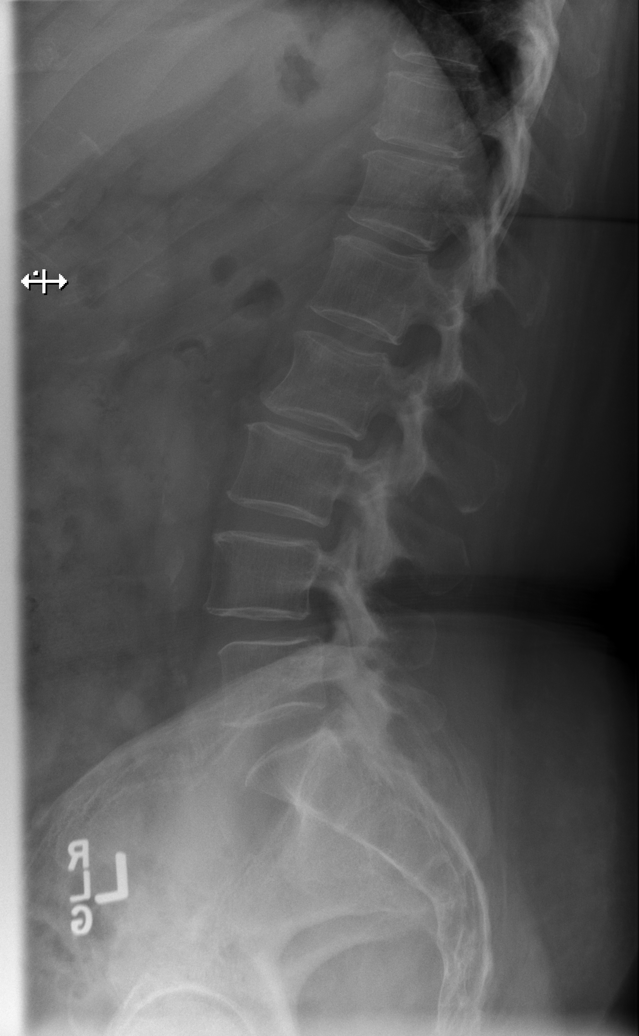

[t lumbar l-5 s-1 spot]
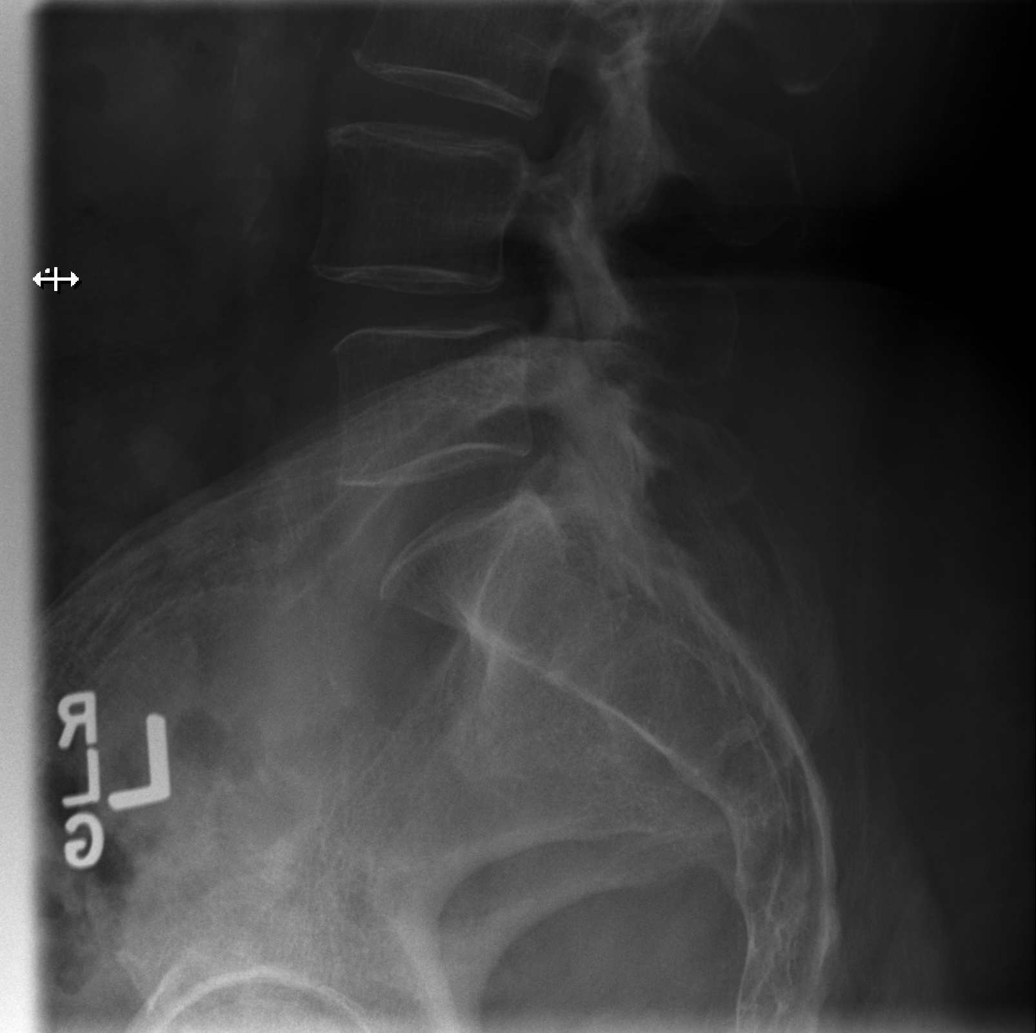

[5 of 5 positions shown; findings below may reference images not displayed]

FINDINGS: The lumbar vertebral bodies are preserved in height. There is grade
1 anterolisthesis of L4 with respect L5. No significant disc space
height loss here is demonstrated. There is mild disc space narrowing
at L2-3. There is facet joint hypertrophy at L3-4, L4-5, and L5-S1.
The pedicles and transverse processes are intact. The observed
portions of the sacrum are normal.
IMPRESSION: Since the previous study there has developed mild disc space
narrowing at L2-3 and mild grade 1 anterolisthesis of L4 with
respect L5. Facet joint hypertrophy from L3 through S1 is slightly
more conspicuous today. There is no compression fracture.

## 2015-03-06 IMAGING — CR DG THORACIC SPINE 2V
5 series · 5 of 5 positions shown · non-contrast
Comparison: Two views thoracic spine [DATE].

CLINICAL DATA: New onset severe mid back pain [DATE]. No known
injury. Subsequent encounter.

EXAM:
THORACIC SPINE 2 VIEWS

[t thoracic spine ap]
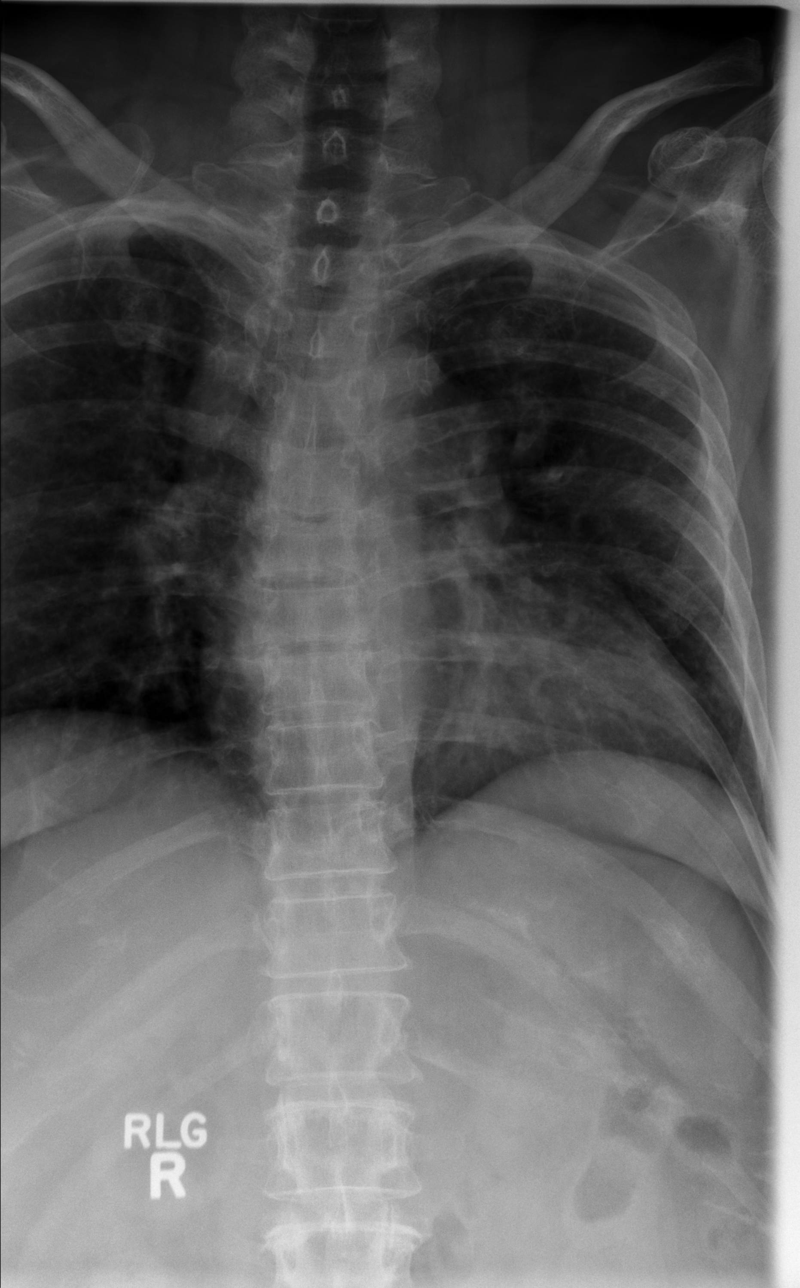

[t thoracic breathing lat (1 of 3)]
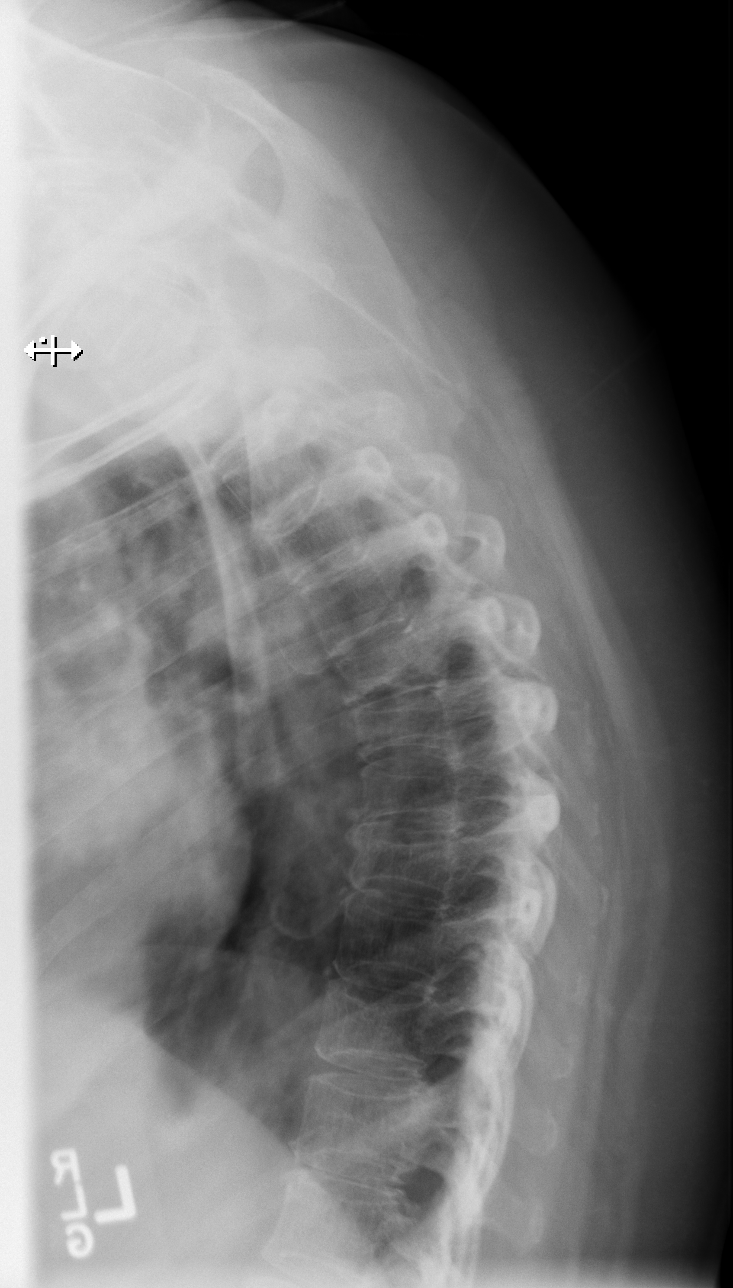

[t thoracic breathing lat (2 of 3)]
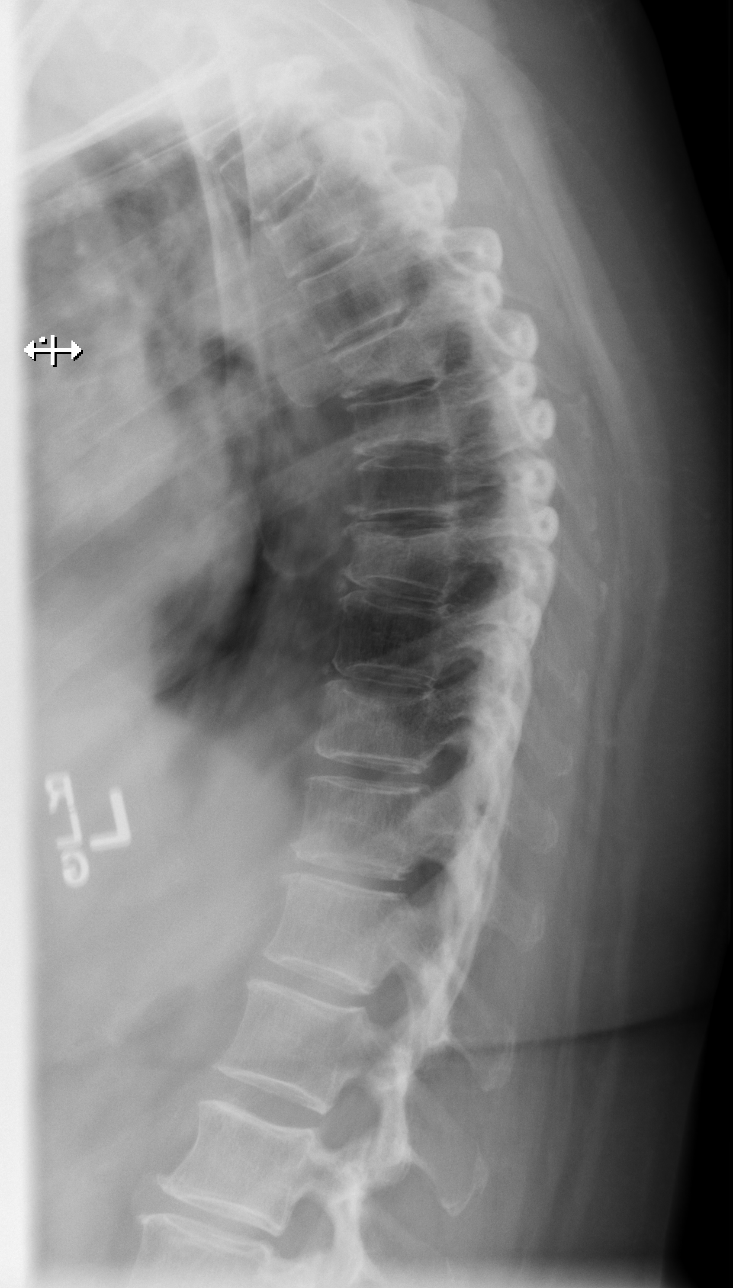

[t thoracic breathing lat (3 of 3)]
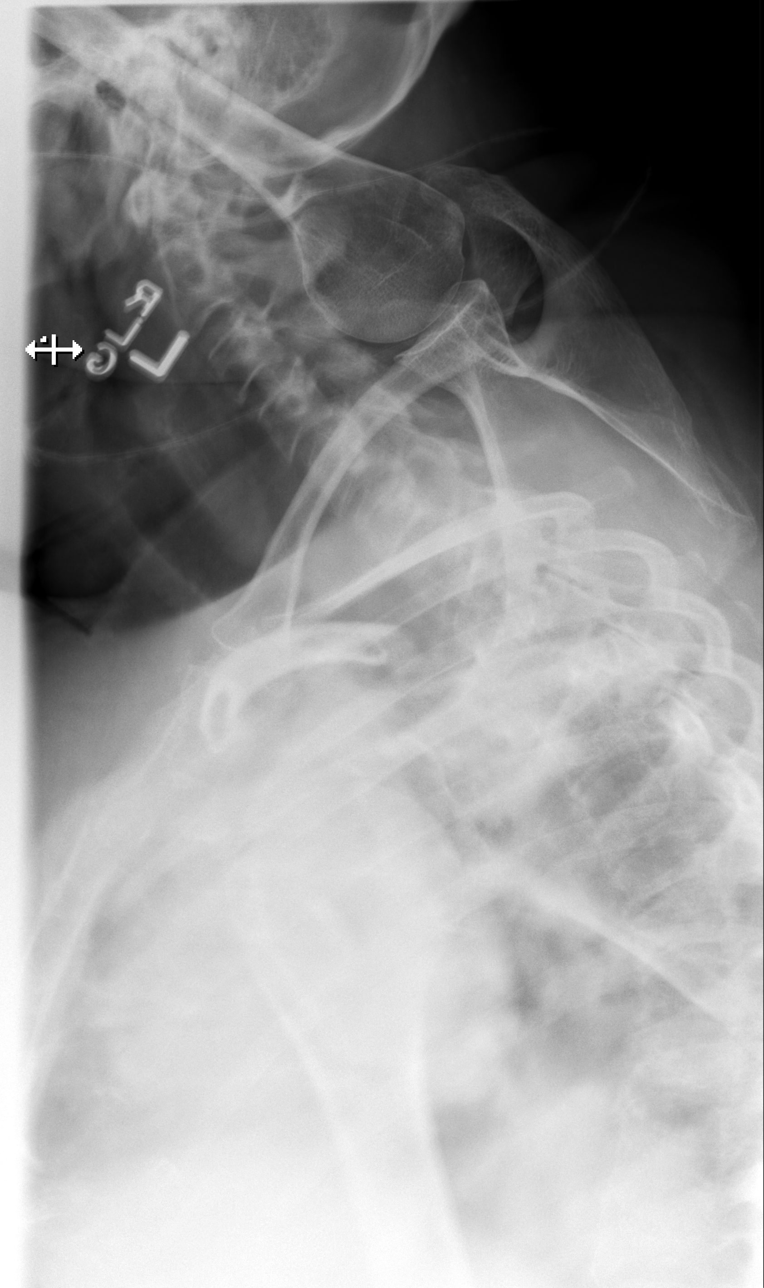

[t thoracic swimmers]
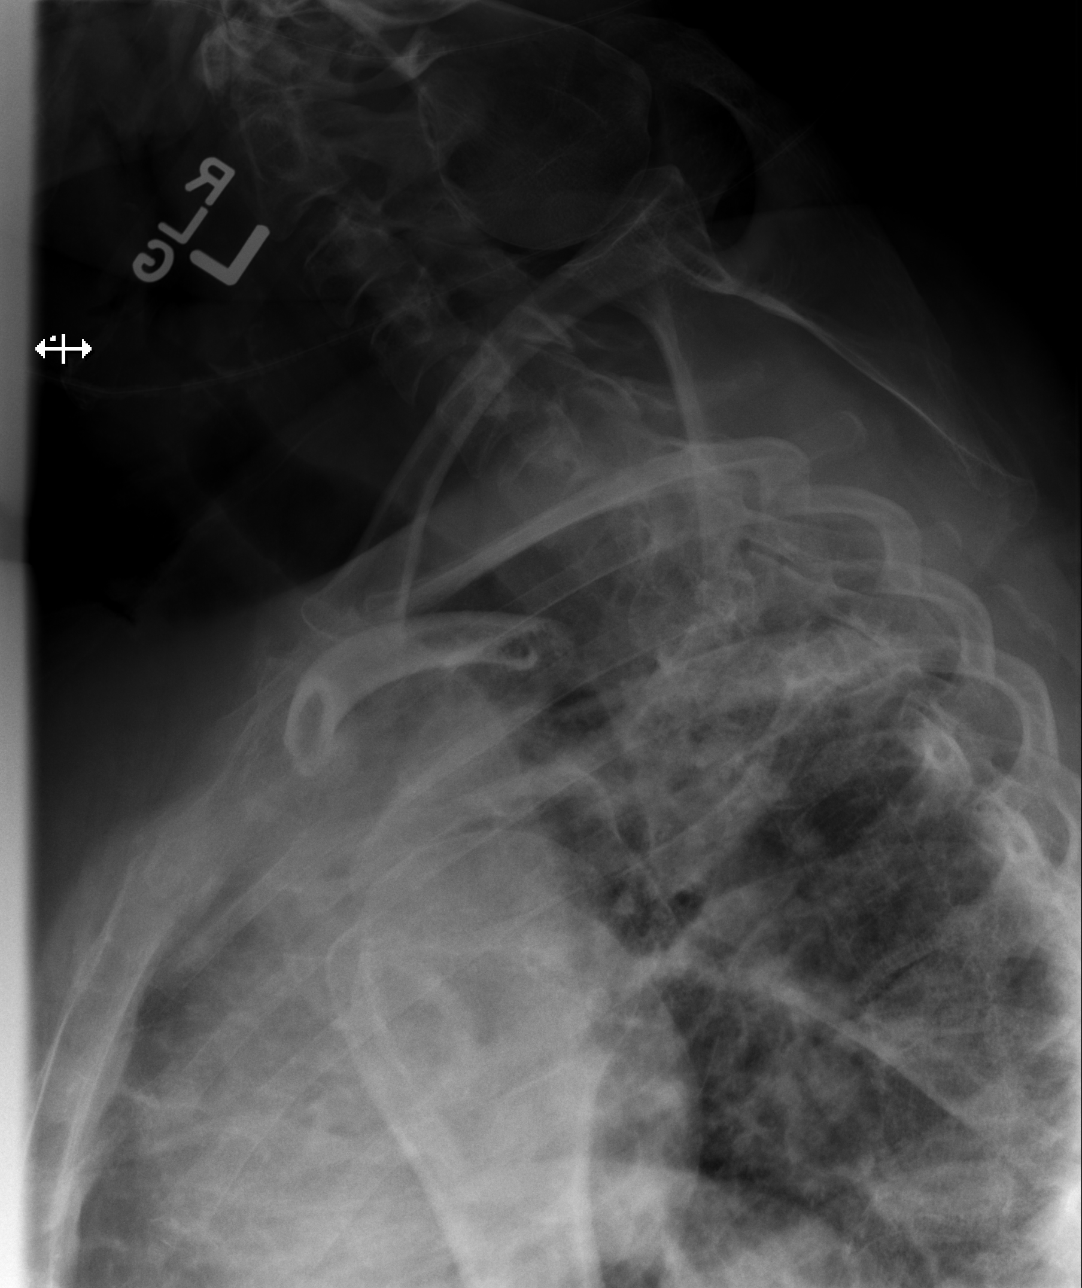

[5 of 5 positions shown; findings below may reference images not displayed]

FINDINGS: Remote compression fracture deformities T3, T5 and T6 are again
seen. There is a mild superior endplate compression fracture of T8
which is more conspicuous than on the prior examination. No other
fracture is identified. Alignment is normal with mild convex right
scoliosis noted.
IMPRESSION: Subacute T8 compression fracture remote T3, T5 and T6 compression
fractures are noted.

## 2015-03-06 MED ORDER — KETOROLAC TROMETHAMINE 30 MG/ML IJ SOLN
30.0000 mg | Freq: Once | INTRAMUSCULAR | Status: AC
  Administered 2015-03-06: 30 mg via INTRAMUSCULAR
  Filled 2015-03-06: qty 1

## 2015-03-06 NOTE — Discharge Instructions (Signed)
Please monitor for new or worsening signs or symptoms, return immediately if any present. Please follow-up with her primary care provider for further evaluation and management. Please continue using previously prescribed pain medication.

## 2015-03-06 NOTE — ED Provider Notes (Signed)
CSN: [id number]     Arrival date & time 03/06/15  1109 History   This chart was scribed for non-physician practitioner, Okey Regal, PA-C, working with Virgel Manifold, MD by Ladene Artist, ED Scribe. This patient was seen in room WTR8/WTR8 and the patient's care was started at 12:43 PM.   Chief Complaint  Patient presents with  . Back Pain   The history is provided by the patient. No language interpreter was used.   HPI Comments: Theresa Fernandez is a 59 y.o. female, with a h/o HTN and DM, who presents to the Emergency Department complaining of constant back pain onset today. Pt states that she lifted her arms slightly above her head when she felt sudden onset of lower back pain. Pt states "it felt like someone punched me in my back." She further reports that pain is exacerbated with standing and movement. She has tried 10 mg hydrocodone PTA without significant relief. She reports similar pain in her upper back from 2 cracked ribs and 3 compression fractures of the T3, 5 and 6. Pt states that her treatment plan included rest and she did not follow-up with a specialist. She was supposed to have a bone scan but did not due to shingles. She reports h/o intermittent sciatica. Pt denies fever, chills, weight loss and any other pain. No h/o kidney complications.   PCP: Antonietta Jewel, MD in [place], [place]  Past Medical History  Diagnosis Date  . Allergy   . Anxiety   . Arthritis   . Depression   . Diabetes mellitus without complication   . HTN (hypertension) 01/12/2013   Past Surgical History  Procedure Laterality Date  . Tubal ligation     Family History  Problem Relation Age of Onset  . Diabetes Mother   . Alcohol abuse Father   . Diabetes Father    Social History  Substance Use Topics  . Smoking status: Current Every Day Smoker -- 1.00 packs/day    Types: Cigarettes  . Smokeless tobacco: None  . Alcohol Use: No   OB History    No data available     Review of Systems  All other  systems reviewed and are negative.  Allergies  Topamax  Home Medications   Prior to Admission medications   Medication Sig Start Date End Date Taking? Authorizing Provider  albuterol (PROVENTIL HFA;VENTOLIN HFA) 108 (90 BASE) MCG/ACT inhaler Inhale 2 puffs into the lungs every 6 (six) hours as needed for wheezing or shortness of breath (wheezing).     Historical Provider, MD  albuterol (PROVENTIL) (2.5 MG/3ML) 0.083% nebulizer solution Take 2.5 mg by nebulization every 6 (six) hours as needed for wheezing or shortness of breath (wheezing).     Historical Provider, MD  ALPRAZolam Duanne Moron) 0.5 MG tablet Take 1 tablet (0.5 mg total) by mouth 3 (three) times daily as needed for sleep. 01/12/13   Theodis Blaze, MD  amphetamine-dextroamphetamine (ADDERALL) 20 MG tablet Take 1 tablet (20 mg total) by mouth 3 (three) times daily. Patient taking differently: Take 30 mg by mouth 2 (two) times daily.  01/12/13   Theodis Blaze, MD  atorvastatin (LIPITOR) 20 MG tablet Take 1 tablet (20 mg total) by mouth daily. 02/11/13   Clanford Marisa Hua, MD  busPIRone (BUSPAR) 10 MG tablet Take 10 mg by mouth 2 (two) times daily.     Historical Provider, MD  HYDROcodone-acetaminophen (NORCO/VICODIN) 5-325 MG per tablet Take 1 tablet by mouth every 8 (eight) hours as needed for pain.  02/11/13   Clanford Marisa Hua, MD  ibuprofen (ADVIL,MOTRIN) 200 MG tablet Take 800 mg by mouth every 6 (six) hours as needed for moderate pain (pain).    Historical Provider, MD  levofloxacin (LEVAQUIN) 500 MG tablet Take 1 tablet (500 mg total) by mouth daily. Patient not taking: Reported on 01/09/2015 05/10/14   Merryl Hacker, MD  lisinopril (ZESTRIL) 2.5 MG tablet Take 1 tablet (2.5 mg total) by mouth daily. 02/16/13   Clanford Marisa Hua, MD  lovastatin (MEVACOR) 20 MG tablet Take 20 mg by mouth at bedtime.    Historical Provider, MD  magnesium gluconate (MAGONATE) 500 MG tablet Take 1 tablet (500 mg total) by mouth 2 (two) times daily.  01/12/13   Theodis Blaze, MD  meloxicam (MOBIC) 7.5 MG tablet Take 7.5 mg by mouth 2 (two) times daily.    Historical Provider, MD  omeprazole (PRILOSEC) 40 MG capsule Take 40 mg by mouth daily.    Historical Provider, MD  phentermine 37.5 MG capsule Take 37.5 mg by mouth every morning.    Historical Provider, MD  pioglitazone (ACTOS) 30 MG tablet Take 1 tablet (30 mg total) by mouth daily. 02/11/13   Clanford Marisa Hua, MD  predniSONE (DELTASONE) 20 MG tablet Take 3 tablets (60 mg total) by mouth once. Patient not taking: Reported on 01/09/2015 05/10/14   Merryl Hacker, MD  pregabalin (LYRICA) 150 MG capsule Take 1 capsule (150 mg total) by mouth at bedtime. 02/11/13   Clanford Marisa Hua, MD  sertraline (ZOLOFT) 100 MG tablet Take 200 mg by mouth at bedtime.    Historical Provider, MD  temazepam (RESTORIL) 15 MG capsule Take 1 capsule (15 mg total) by mouth at bedtime as needed for sleep. Patient not taking: Reported on 01/09/2015 01/12/13   Theodis Blaze, MD  traMADol (ULTRAM) 50 MG tablet Take 1 tablet (50 mg total) by mouth every 8 (eight) hours as needed for pain. Patient not taking: Reported on 01/09/2015 03/19/13   Robbie Lis, MD   BP 106/63 mmHg  Pulse 80  Temp(Src) 98.9 F (37.2 C) (Oral)  Resp 17  SpO2 96% Physical Exam  Constitutional: She is oriented to person, place, and time. She appears well-developed and well-nourished. No distress.  HENT:  Head: Normocephalic and atraumatic.  Eyes: Conjunctivae and EOM are normal.  Neck: Neck supple. No tracheal deviation present.  Cardiovascular: Normal rate.   Pulmonary/Chest: Effort normal. No respiratory distress.  Musculoskeletal: Normal range of motion.  Patient mildly tender to thoracic and lumbar vertebrae, no obvious deformities, step-offs, rash, signs of trauma. Distal sensation strength and function intact 5 out of 5 strength of the distal extremities, sensation grossly intact, cap refill less than 3 seconds, patellar reflexes  2+  Neurological: She is alert and oriented to person, place, and time.  Skin: Skin is warm and dry.  Psychiatric: She has a normal mood and affect. Her behavior is normal.  Nursing note and vitals reviewed.  ED Course  Procedures (including critical care time) COORDINATION OF CARE: 12:50 PM-Discussed treatment plan with pt at bedside and pt agreed to plan.     Imaging Review Dg Thoracic Spine 2 View  03/06/2015   CLINICAL DATA:  New onset severe mid back pain 03/05/2015. No known injury. Subsequent encounter.  EXAM: THORACIC SPINE 2 VIEWS  COMPARISON:  Two views thoracic spine 01/09/2015.  FINDINGS: Remote compression fracture deformities T3, T5 and T6 are again seen. There is a mild superior endplate compression fracture  of T8 which is more conspicuous than on the prior examination. No other fracture is identified. Alignment is normal with mild convex right scoliosis noted.  IMPRESSION: Subacute T8 compression fracture remote T3, T5 and T6 compression fractures are noted.   Electronically Signed   By: Inge Rise M.D.   On: 03/06/2015 12:28   Dg Lumbar Spine Complete  03/06/2015   CLINICAL DATA:  New onset of mid back pain yesterday without known trauma  EXAM: LUMBAR SPINE - COMPLETE 4+ VIEW  COMPARISON:  Lumbar spine series of April 23, 2007  FINDINGS: The lumbar vertebral bodies are preserved in height. There is grade 1 anterolisthesis of L4 with respect L5. No significant disc space height loss here is demonstrated. There is mild disc space narrowing at L2-3. There is facet joint hypertrophy at L3-4, L4-5, and L5-S1. The pedicles and transverse processes are intact. The observed portions of the sacrum are normal.  IMPRESSION: Since the previous study there has developed mild disc space narrowing at L2-3 and mild grade 1 anterolisthesis of L4 with respect L5. Facet joint hypertrophy from L3 through S1 is slightly more conspicuous today. There is no compression fracture.   Electronically  Signed   By: David  Martinique M.D.   On: 03/06/2015 12:26    EKG Interpretation None      MDM   Final diagnoses:  Compression fracture    Labs: UA-  Imaging: DG Thoracic Spine, DG Lumbar Spine - see above  Consults:  Therapeutics: Toradol injection   Discharge Meds:   Assessment/Plan: Patient presents with compression fractures to her thoracic spine, these are likely old fractures. Subacute T8 compression fracture. Plan appear stable, patient is able to ambulate with minimal difficulty. She has no lower extremity involvement. She'll be encouraged follow-up with her primary care provider for further evaluation and management. She is given strict return precautions. Patient and he has narcotic pain medication at home, she is encouraged to take that as directed.       I personally performed the services described in this documentation, which was scribed in my presence. The recorded information has been reviewed and is accurate.   Okey Regal, PA-C 03/06/15 2057  Virgel Manifold, MD 03/09/15 [phone]

## 2015-03-06 NOTE — ED Notes (Signed)
Pt here today for back pain. 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 Went to lift wrap off and felt tug in middle of lower back.  Pt also states approx 2 months ago she bent down and felt tug.  Was dx with fractures in her back at that time. However, pt is in a different area.  No change in urination.

## 2015-05-04 ENCOUNTER — Ambulatory Visit
Admission: RE | Admit: 2015-05-04 | Discharge: 2015-05-04 | Disposition: A | Discharge status: Home / Self Care | Source: Ambulatory Visit | Attending: Internal Medicine | Admitting: Internal Medicine

## 2015-05-04 DIAGNOSIS — Z780 Asymptomatic menopausal state: Secondary | ICD-10-CM

## 2015-05-09 ENCOUNTER — Telehealth (HOSPITAL_COMMUNITY): Payer: Self-pay | Admitting: Interventional Radiology

## 2015-05-09 NOTE — Telephone Encounter (Signed)
Called pt, left VM for her to call and schedule consult with Deveshwar concerning her back. JM

## 2015-05-11 ENCOUNTER — Other Ambulatory Visit (HOSPITAL_COMMUNITY): Payer: Self-pay | Admitting: Interventional Radiology

## 2015-05-11 DIAGNOSIS — M549 Dorsalgia, unspecified: Secondary | ICD-10-CM

## 2015-05-11 DIAGNOSIS — IMO0002 Reserved for concepts with insufficient information to code with codable children: Secondary | ICD-10-CM

## 2015-05-16 ENCOUNTER — Other Ambulatory Visit: Payer: Self-pay | Admitting: Radiology

## 2015-05-17 ENCOUNTER — Other Ambulatory Visit: Payer: Self-pay | Admitting: Radiology

## 2015-05-18 ENCOUNTER — Other Ambulatory Visit (HOSPITAL_COMMUNITY): Payer: Self-pay | Admitting: Interventional Radiology

## 2015-05-18 ENCOUNTER — Encounter (HOSPITAL_COMMUNITY): Payer: Self-pay

## 2015-05-18 ENCOUNTER — Ambulatory Visit (HOSPITAL_COMMUNITY)
Admission: RE | Admit: 2015-05-18 | Discharge: 2015-05-18 | Disposition: A | Discharge status: Home / Self Care | Source: Ambulatory Visit | Attending: Interventional Radiology | Admitting: Interventional Radiology

## 2015-05-18 DIAGNOSIS — F419 Anxiety disorder, unspecified: Secondary | ICD-10-CM | POA: Diagnosis not present

## 2015-05-18 DIAGNOSIS — I10 Essential (primary) hypertension: Secondary | ICD-10-CM | POA: Diagnosis not present

## 2015-05-18 DIAGNOSIS — M4850XA Collapsed vertebra, not elsewhere classified, site unspecified, initial encounter for fracture: Secondary | ICD-10-CM | POA: Insufficient documentation

## 2015-05-18 DIAGNOSIS — M549 Dorsalgia, unspecified: Secondary | ICD-10-CM

## 2015-05-18 DIAGNOSIS — F329 Major depressive disorder, single episode, unspecified: Secondary | ICD-10-CM | POA: Insufficient documentation

## 2015-05-18 DIAGNOSIS — M4854XA Collapsed vertebra, not elsewhere classified, thoracic region, initial encounter for fracture: Secondary | ICD-10-CM | POA: Insufficient documentation

## 2015-05-18 DIAGNOSIS — E119 Type 2 diabetes mellitus without complications: Secondary | ICD-10-CM | POA: Diagnosis not present

## 2015-05-18 DIAGNOSIS — F17210 Nicotine dependence, cigarettes, uncomplicated: Secondary | ICD-10-CM | POA: Diagnosis not present

## 2015-05-18 DIAGNOSIS — IMO0002 Reserved for concepts with insufficient information to code with codable children: Secondary | ICD-10-CM

## 2015-05-18 LAB — BASIC METABOLIC PANEL
ANION GAP: 6 (ref 5–15)
BUN: 12 mg/dL (ref 6–20)
CALCIUM: 9.1 mg/dL (ref 8.9–10.3)
CO2: 30 mmol/L (ref 22–32)
Chloride: 99 mmol/L — ABNORMAL LOW (ref 101–111)
Creatinine, Ser: 1.11 mg/dL — ABNORMAL HIGH (ref 0.44–1.00)
GFR, EST NON AFRICAN AMERICAN: 53 mL/min — AB (ref >60)
GLUCOSE: 121 mg/dL — AB (ref 65–99)
Potassium: 3.8 mmol/L (ref 3.5–5.1)
SODIUM: 135 mmol/L (ref 135–145)

## 2015-05-18 LAB — CBC
HCT: 45.5 % (ref 36.0–46.0)
HEMOGLOBIN: 14.3 g/dL (ref 12.0–15.0)
MCH: 31.2 pg (ref 26.0–34.0)
MCHC: 31.4 g/dL (ref 30.0–36.0)
MCV: 99.3 fL (ref 78.0–100.0)
Platelets: 248 10*3/uL (ref 150–400)
RBC: 4.58 MIL/uL (ref 3.87–5.11)
RDW: 14.4 % (ref 11.5–15.5)
WBC: 7.9 10*3/uL (ref 4.0–10.5)

## 2015-05-18 LAB — APTT: APTT: 28 s (ref 24–37)

## 2015-05-18 LAB — GLUCOSE, CAPILLARY: Glucose-Capillary: 99 mg/dL (ref 65–99)

## 2015-05-18 LAB — PROTIME-INR
INR: 1.03 (ref 0.00–1.49)
PROTHROMBIN TIME: 13.7 s (ref 11.6–15.2)

## 2015-05-18 IMAGING — XA IR VERTEBROPLASTY ADDL INJECTION
1 series · 14 of 24 positions shown · IV contrast (IODINE)
Comparison: none

CLINICAL DATA: Patient with severely painful compression fracture
at T7, T8 and T10.

[Series 300: ir kypho vertebral thoracic augmentation · 14 of 47 slices shown]
[im 1/47]
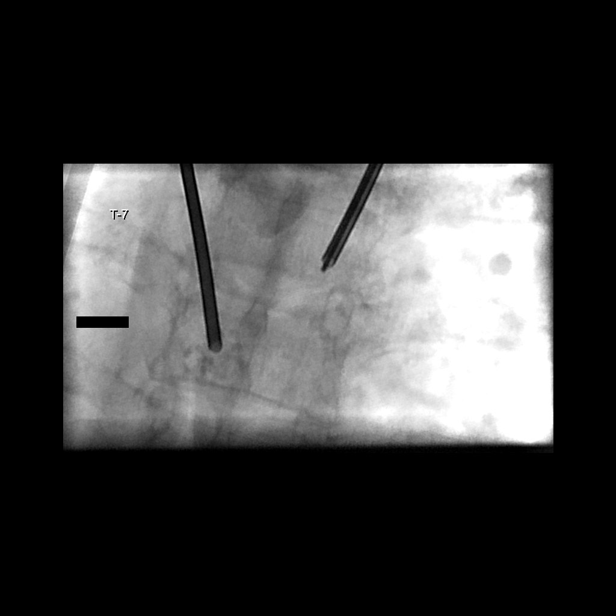
[im 5/47]
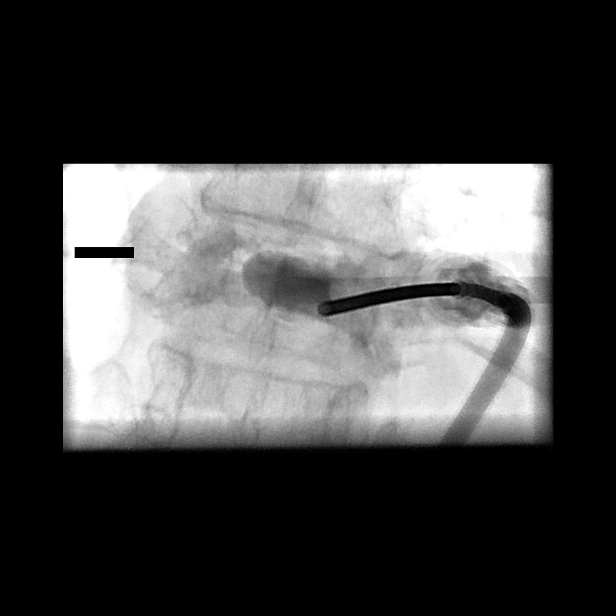
[im 9/47]
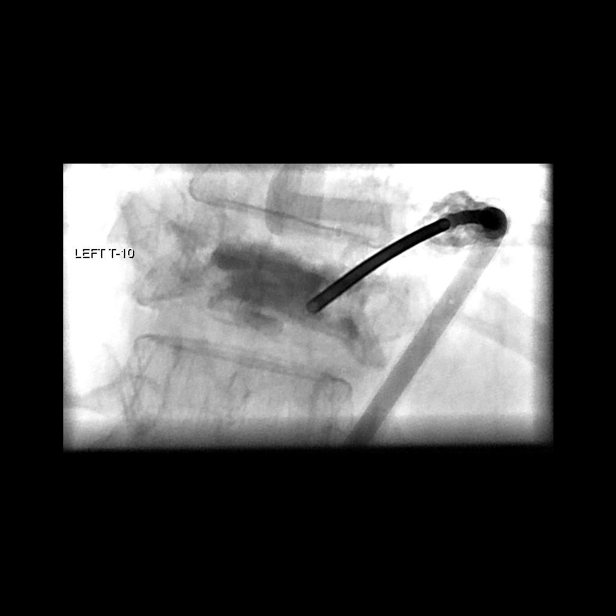
[im 13/47]
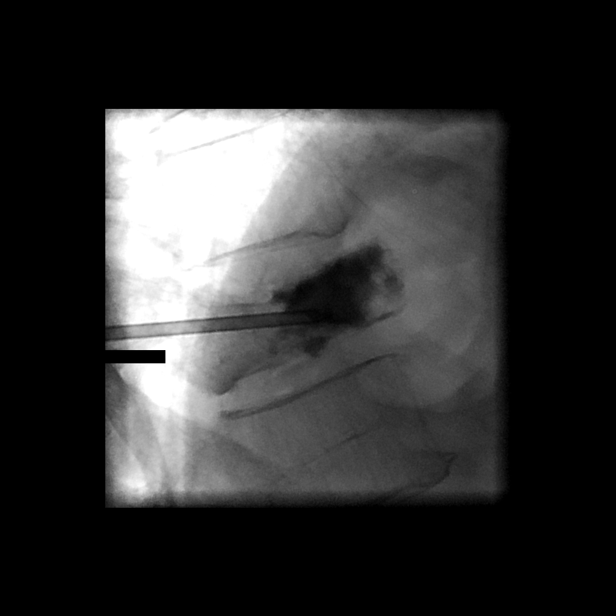
[im 15/47]
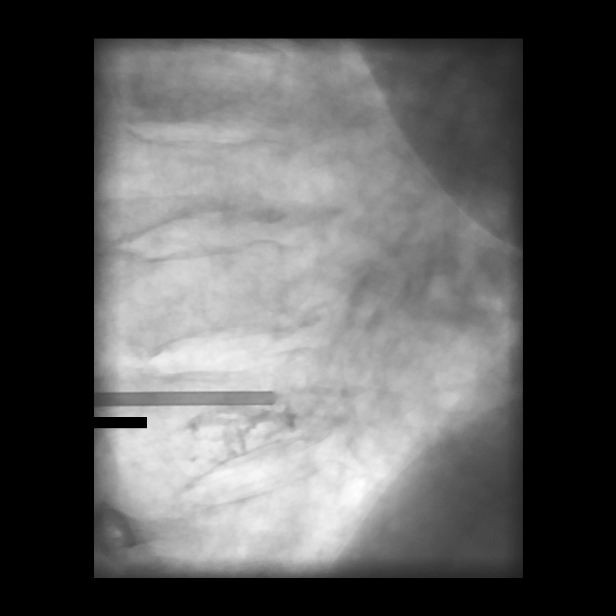
[im 19/47]
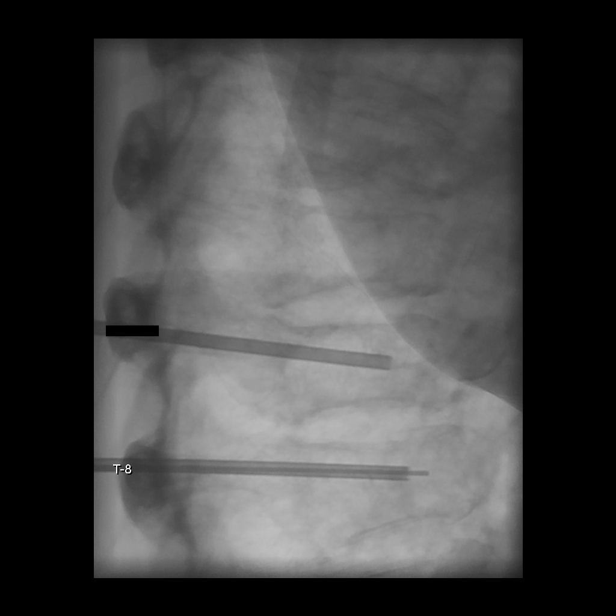
[im 23/47]
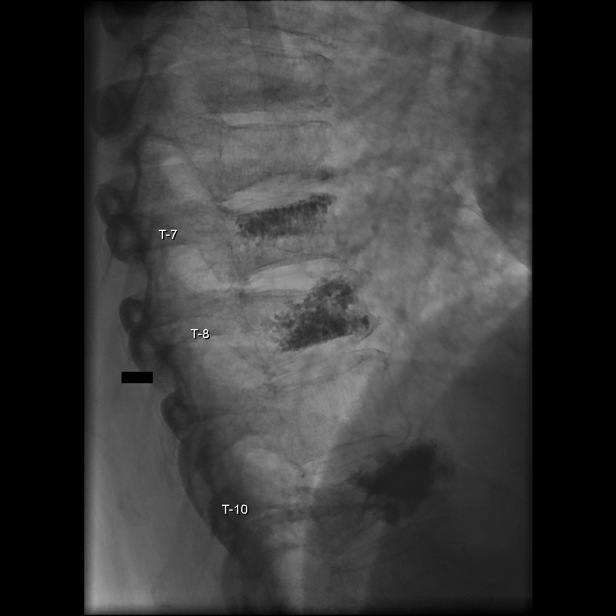
[im 25/47]
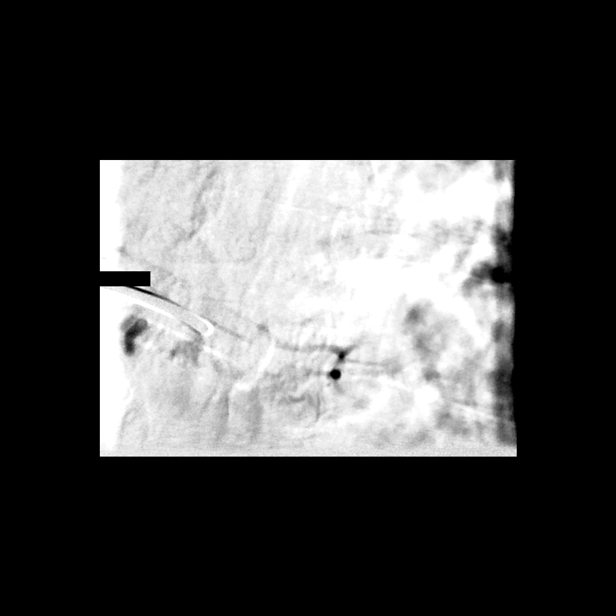
[im 29/47]
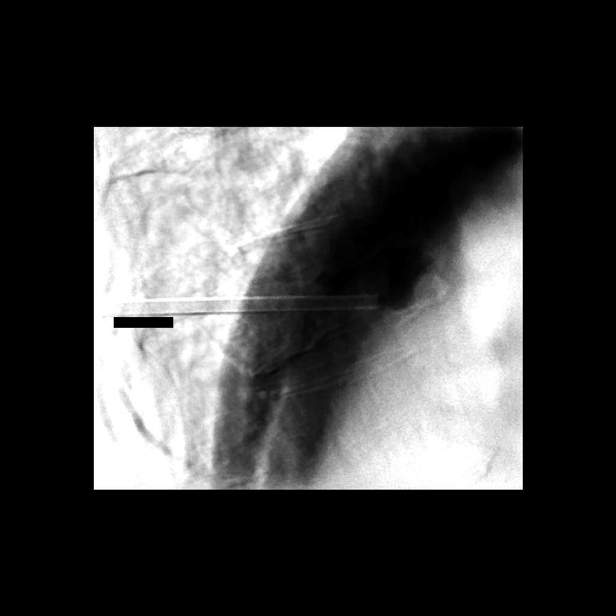
[im 33/47]
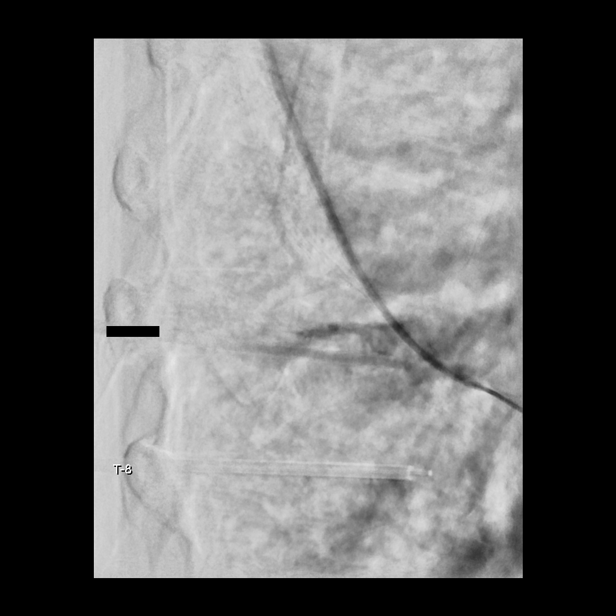
[im 37/47]
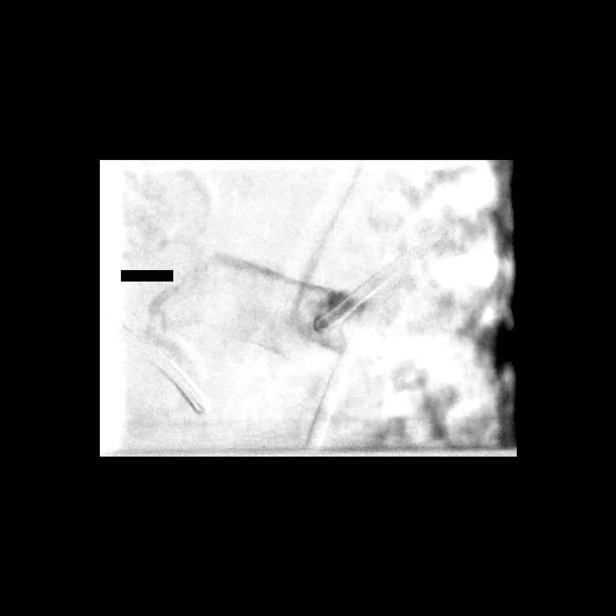
[im 39/47]
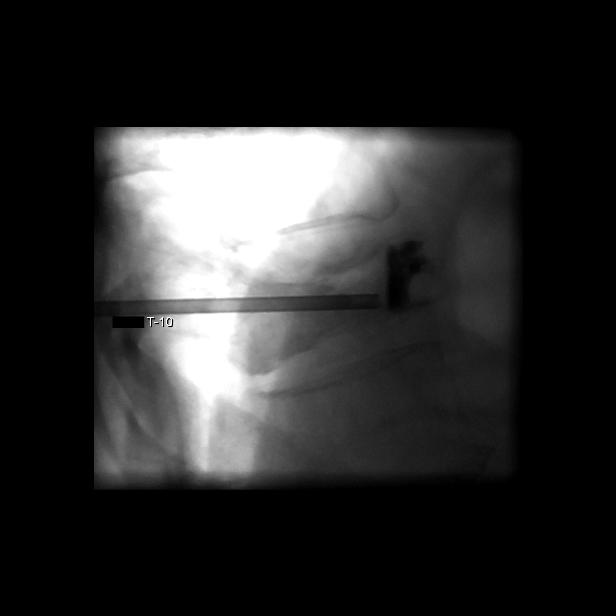
[im 43/47]
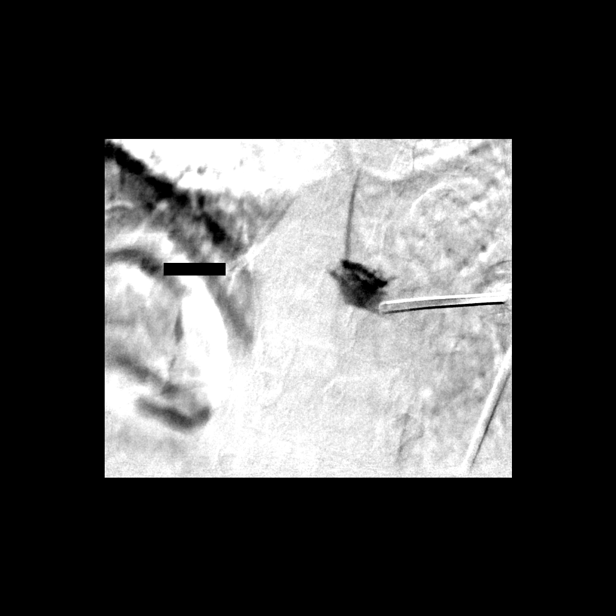
[im 47/47]
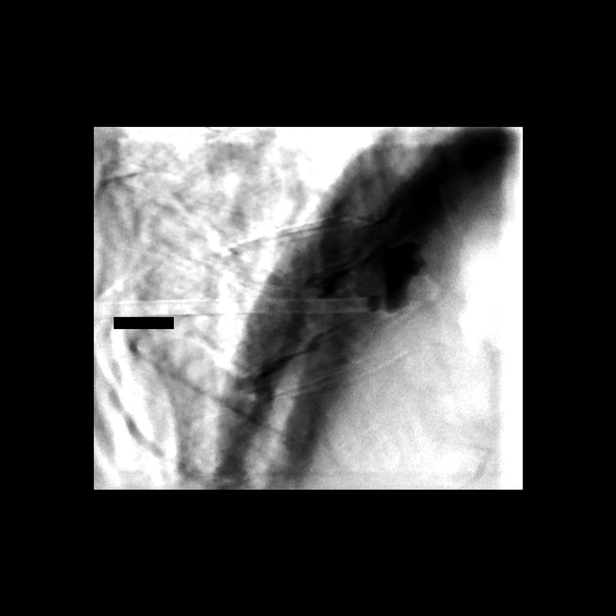

[14 of 24 positions shown; findings below may reference images not displayed]

EXAM:
IR VERTEBROPLASTY CERVICOTHORACIC INJ AT T7, T8 AND T10

MEDICATIONS:
Versed 3 mg IV, Fentanyl 250 mcg IV.  Dilaudid 2 mg IV.

ANESTHESIA/SEDATION:
Total Moderate Sedation Time:  40 minutes.

FLUOROSCOPY TIME:  17 minutes 36 seconds

PROCEDURE:
Following a full explanation of the procedure along with the
potential associated complications, an informed witnessed consent
was obtained.

The patient was placed prone on the fluoroscopic table. Nasal oxygen
was administered. Physiologic monitoring was performed throughout
the duration of the procedure. The skin overlying the thoracic
region was prepped and draped in the usual sterile fashion. The T7,
T8 and T10 vertebral bodies were identified and the right pedicle at
T10, the left pedicle at T8 and the right pedicle at T7 were then
infiltrated with 0.25% bupivacaine, followed by the advancement of
13 gauge GARRO spinal needles into the anterior [DATE] at all three
levels. A gentle contrast injection demonstrated early opacification
of the paraspinous venous structures at T7 and T8 prompting the use
of Gel-Foam pledgets into the needles prior to the delivery of
methylmethacrylate mixture. At this time, methylmethacrylate mixture
was reconstituted. Under biplane intermittent fluoroscopy, the
methylmethacrylate was then injected into the T7, T8 and T10
vertebral bodies with excellent filling of the vertebral bodies at
T7, T8 and T10.

No extravasation was noted into the disk spaces or posteriorly into
the spinal canal. No epidural venous contamination was seen.

The needle was then removed. Hemostasis was achieved at the skin
entry sites.

There were no acute complications. Patient tolerated the procedure
well. The patient was observed for 3 hours and discharged in good
condition.
IMPRESSION: Status post vertebral body augmentation for painful compression
fracture at T7, T8 and T10 using vertebroplasty technique.

## 2015-05-18 MED ORDER — MIDAZOLAM HCL 2 MG/2ML IJ SOLN
INTRAMUSCULAR | Status: AC | PRN
  Administered 2015-05-18 (×4): 0.5 mg via INTRAVENOUS
  Administered 2015-05-18: 1 mg via INTRAVENOUS

## 2015-05-18 MED ORDER — FENTANYL CITRATE (PF) 100 MCG/2ML IJ SOLN
INTRAMUSCULAR | Status: AC | PRN
  Administered 2015-05-18: 12.5 ug via INTRAVENOUS
  Administered 2015-05-18: 25 ug via INTRAVENOUS
  Administered 2015-05-18: 12.5 ug via INTRAVENOUS
  Administered 2015-05-18: 25 ug via INTRAVENOUS
  Administered 2015-05-18: 12.5 ug via INTRAVENOUS

## 2015-05-18 MED ORDER — HYDROMORPHONE HCL 1 MG/ML IJ SOLN
INTRAMUSCULAR | Status: AC | PRN
  Administered 2015-05-18 (×2): 1 mg via INTRAVENOUS

## 2015-05-18 MED ORDER — CEFAZOLIN SODIUM-DEXTROSE 2-3 GM-% IV SOLR: 2.0000 g | INTRAVENOUS | Status: DC

## 2015-05-18 MED ORDER — TOBRAMYCIN SULFATE 1.2 G IJ SOLR
INTRAMUSCULAR | Status: AC
  Filled 2015-05-18: qty 1.2

## 2015-05-18 MED ORDER — GELATIN ABSORBABLE 12-7 MM EX MISC
CUTANEOUS | Status: AC
  Filled 2015-05-18: qty 1

## 2015-05-18 MED ORDER — FENTANYL CITRATE (PF) 100 MCG/2ML IJ SOLN
INTRAMUSCULAR | Status: AC
  Filled 2015-05-18: qty 6

## 2015-05-18 MED ORDER — SODIUM CHLORIDE 0.9 % IV SOLN
INTRAVENOUS | Status: AC | PRN
  Administered 2015-05-18: 50 mL/h via INTRAVENOUS

## 2015-05-18 MED ORDER — CEFAZOLIN SODIUM-DEXTROSE 2-3 GM-% IV SOLR
INTRAVENOUS | Status: AC
  Administered 2015-05-18: 2000 mg
  Filled 2015-05-18: qty 50

## 2015-05-18 MED ORDER — SODIUM CHLORIDE 0.9 % IV SOLN: INTRAVENOUS | Status: AC

## 2015-05-18 MED ORDER — HYDROMORPHONE HCL 1 MG/ML IJ SOLN
INTRAMUSCULAR | Status: AC
  Filled 2015-05-18: qty 3

## 2015-05-18 MED ORDER — IOHEXOL 300 MG/ML  SOLN
50.0000 mL | Freq: Once | INTRAMUSCULAR | Status: DC | PRN
  Administered 2015-05-18: 10 mL
  Filled 2015-05-18: qty 50

## 2015-05-18 MED ORDER — SODIUM CHLORIDE 0.9 % IV SOLN
Freq: Once | INTRAVENOUS | Status: AC
  Administered 2015-05-18: 08:00:00 via INTRAVENOUS

## 2015-05-18 MED ORDER — BUPIVACAINE HCL (PF) 0.25 % IJ SOLN
INTRAMUSCULAR | Status: AC
  Filled 2015-05-18: qty 60

## 2015-05-18 MED ORDER — FENTANYL CITRATE (PF) 100 MCG/2ML IJ SOLN
INTRAMUSCULAR | Status: DC | PRN
  Administered 2015-05-18 (×2): 12.5 ug via INTRAVENOUS

## 2015-05-18 MED ORDER — MIDAZOLAM HCL 2 MG/2ML IJ SOLN
INTRAMUSCULAR | Status: AC
  Filled 2015-05-18: qty 6

## 2015-05-18 NOTE — Sedation Documentation (Signed)
Pt tearful, yelling in pain additional medication given VSS BP 126/80

## 2015-05-18 NOTE — Sedation Documentation (Signed)
Patient is resting comfortably. 

## 2015-05-18 NOTE — Sedation Documentation (Signed)
Patient is resting comfortably. Pt tearful, additional medication given, VSS

## 2015-05-18 NOTE — Sedation Documentation (Addendum)
Patient is resting comfortably. 

## 2015-05-18 NOTE — Procedures (Signed)
S/ P T7,T8 and T 10 VP

## 2015-05-18 NOTE — Sedation Documentation (Signed)
Pt alert and oriented, pt continues to endorse pain, no additional pain medications given, 500 cc bolus being administered for SBP 84

## 2015-05-18 NOTE — Sedation Documentation (Addendum)
Patient is resting comfortably. Pt remains tearful

## 2015-05-18 NOTE — Discharge Instructions (Signed)
1.No stopping,bending or lifting more than 10 lbs for 2 weeks. 2.Use walker to ambulate for 2 weeks  3.RTC in 2 weeksKYPHOPLASTY/VERTEBROPLASTY DISCHARGE INSTRUCTIONS  Medications: (check all that apply)     Resume all home medications as before procedure.                Continue your pain medications as prescribed as needed.  Over the next 3-5 days, decrease your pain medication as tolerated.  Over the counter medications (i.e. Tylenol, ibuprofen, and aleve) may be substituted once severe/moderate pain symptoms have subsided.   Wound Care: - Bandages may be removed the day following your procedure.  You may get your incision wet once bandages are removed.  Bandaids may be used to cover the incisions until scab formation.  Topical ointments are optional.  - If you develop a fever greater than 101 degrees, have increased skin redness at the incision sites or pus-like oozing from incisions occurring within 1 week of the procedure, contact radiology at [phone] or [phone].  - Ice pack to back for 15-20 minutes 2-3 time per day for first 2-3 days post procedure.  The ice will expedite muscle healing and help with the pain from the incisions.   Activity: - Bedrest today with limited activity for 24 hours post procedure.  - No driving for 48 hours.  - Increase your activity as tolerated after bedrest (with assistance if necessary).  - Refrain from any strenuous activity or heavy lifting (greater than 10 lbs.).   Follow up: - Contact radiology at [phone] or [phone] if any questions/concerns.  - A physician assistant from radiology will contact you in approximately 1 week.  - If a biopsy was performed at the time of your procedure, your referring physician should receive the results in usually 2-3 days.

## 2015-05-18 NOTE — H&P (Signed)
Chief Complaint: Patient was seen in consultation today for vertebroplasty/kyphoplasty at the request of Dr Tonita Cong  Referring Physician(s): Dr Tonita Cong  History of Present Illness: Theresa Fernandez is a 59 y.o. female   Pt states she has had worsening back pain really since April 2016 Denies any real injury although she feels she can pinpoint pain to when she had stooped at home in April Compression fxs of T3/T5/T6 were noted in June plain films Treated conservatively with meds and rest Hydrocodone and Norco- relieve slightly and allow sleep Then in August she reached over her head and pain worsened suddenely Ranks pain 10 on 10 scale MRI performed 03/24/2015 reveals acute fxs Outside films reviewed with Dr Estanislado Pandy Fractures of T5/T6/T7/T8 and T10 are noted Pt here now for possible vertebroplasty/kyphoplasty  Past Medical History  Diagnosis Date  . Allergy   . Anxiety   . Arthritis   . Depression   . Diabetes mellitus without complication ([place])   . HTN (hypertension) 01/12/2013    Past Surgical History  Procedure Laterality Date  . Tubal ligation      Allergies: Topamax  Medications: Prior to Admission medications   Medication Sig Start Date End Date Taking? Authorizing Provider  albuterol (PROVENTIL HFA;VENTOLIN HFA) 108 (90 BASE) MCG/ACT inhaler Inhale 2 puffs into the lungs every 6 (six) hours as needed for wheezing or shortness of breath (wheezing).    Yes Historical Provider, MD  ALPRAZolam Duanne Moron) 0.5 MG tablet Take 1 tablet (0.5 mg total) by mouth 3 (three) times daily as needed for sleep. 01/12/13  Yes Theodis Blaze, MD  amphetamine-dextroamphetamine (ADDERALL) 20 MG tablet Take 1 tablet (20 mg total) by mouth 3 (three) times daily. Patient taking differently: Take 30 mg by mouth 2 (two) times daily.  01/12/13  Yes Theodis Blaze, MD  atorvastatin (LIPITOR) 20 MG tablet Take 1 tablet (20 mg total) by mouth daily. 02/11/13  Yes Clanford Marisa Hua, MD  busPIRone  (BUSPAR) 10 MG tablet Take 10 mg by mouth 2 (two) times daily.    Yes Historical Provider, MD  HYDROcodone-acetaminophen (NORCO) 7.5-325 MG tablet Take 1 tablet by mouth every 6 (six) hours as needed. 05/11/15  Yes Historical Provider, MD  lisinopril (ZESTRIL) 2.5 MG tablet Take 1 tablet (2.5 mg total) by mouth daily. 02/16/13  Yes Clanford Marisa Hua, MD  magnesium gluconate (MAGONATE) 500 MG tablet Take 1 tablet (500 mg total) by mouth 2 (two) times daily. 01/12/13  Yes Theodis Blaze, MD  omeprazole (PRILOSEC) 40 MG capsule Take 40 mg by mouth daily.   Yes Historical Provider, MD  pioglitazone (ACTOS) 30 MG tablet Take 1 tablet (30 mg total) by mouth daily. 02/11/13  Yes Clanford Marisa Hua, MD  pregabalin (LYRICA) 150 MG capsule Take 1 capsule (150 mg total) by mouth at bedtime. 02/11/13  Yes Clanford Marisa Hua, MD  sertraline (ZOLOFT) 100 MG tablet Take 200 mg by mouth at bedtime.   Yes Historical Provider, MD  albuterol (PROVENTIL) (2.5 MG/3ML) 0.083% nebulizer solution Take 2.5 mg by nebulization every 6 (six) hours as needed for wheezing or shortness of breath (wheezing).     Historical Provider, MD  phentermine 37.5 MG capsule Take 37.5 mg by mouth every morning.    Historical Provider, MD     Family History  Problem Relation Age of Onset  . Diabetes Mother   . Alcohol abuse Father   . Diabetes Father     Social History   Social History  .  Marital Status: Married    Spouse Name: N/A  . Number of Children: N/A  . Years of Education: N/A   Social History Main Topics  . Smoking status: Current Every Day Smoker -- 1.00 packs/day    Types: Cigarettes  . Smokeless tobacco: None  . Alcohol Use: No  . Drug Use: No  . Sexual Activity: Not Asked   Other Topics Concern  . None   Social History Narrative    Review of Systems: A 12 point ROS discussed and pertinent positives are indicated in the HPI above.  All other systems are negative.  Review of Systems  Constitutional: Positive  for activity change. Negative for fever and unexpected weight change.  Respiratory: Negative for cough and shortness of breath.   Musculoskeletal: Positive for back pain and gait problem.  Neurological: Positive for weakness.  Psychiatric/Behavioral: Negative for behavioral problems and confusion.    Vital Signs: BP 121/67 mmHg  Pulse 98  Temp(Src) 97.6 F (36.4 C)  Resp 18  Ht 5\' 8"  (1.727 m)  Wt 244 lb (110.678 kg)  BMI 37.11 kg/m2  SpO2 93%  Physical Exam  Constitutional: She is oriented to person, place, and time.  Cardiovascular: Normal rate, regular rhythm and normal heart sounds.   Pulmonary/Chest: Effort normal and breath sounds normal. She has no wheezes.  Abdominal: Soft. Bowel sounds are normal. There is no tenderness.  Musculoskeletal: Normal range of motion.  Definite pain to palpate mid spine area Waist line and slightly higher No pain in upper back or low back  Neurological: She is alert and oriented to person, place, and time.  Skin: Skin is warm and dry.  Psychiatric: She has a normal mood and affect. Her behavior is normal. Judgment and thought content normal.  Nursing note and vitals reviewed.   Mallampati Score:  MD Evaluation Airway: WNL Heart: WNL Abdomen: WNL Chest/ Lungs: WNL ASA  Classification: 3 Mallampati/Airway Score: One  Imaging: Dg Bone Density  05/04/2015  EXAM: DUAL X-RAY ABSORPTIOMETRY (DXA) FOR BONE MINERAL DENSITY IMPRESSION: Referring Physician:  Antonietta Jewel MD PATIENT: Name: Theresa, Fernandez Patient ID: [id number] Birth Date: 03-25-1956 Height: 66.0 in. Sex: Female Measured: 05/04/2015 Weight: 244.0 lbs. Indications: Caucasian, Estrogen Deficient, Height Loss (781.91), Low Calcium Intake (269.3), Lupus, Postmenopausal, Tobacco User (Current Smoker) Fractures: Rib, Spine Treatments: ASSESSMENT: The BMD measured at AP Spine L1-L4 is 1.098 g/cm2 with a T-score of -0.8. This patient is considered normal according to [facility]) criteria. Site Region Measured Date Measured Age WHO YA BMD Classification T-score AP Spine L1-L4 05/04/2015 60.6 Normal -0.8 1.098 g/cm2 DualFemur Neck Right 05/04/2015 60.6 Normal -0.7 0.939 g/cm2 World Health Organization [facility]) criteria for post-menopausal, Caucasian Women: Normal       T-score at or above -1 SD Osteopenia   T-score between -1 and -2.5 SD Osteoporosis T-score at or below -2.5 SD RECOMMENDATION: [facility] recommends that FDA-approved medical therapies be considered in postmenopausal women and men age 29 or older with a: 1. Hip or vertebral (clinical or morphometric) fracture. 2. T-score of <-2.5 at the spine or hip. 3. Ten-year fracture probability by FRAX of 3% or greater for hip fracture or 20% or greater for major osteoporotic fracture. All treatment decisions require clinical judgment and consideration of individual patient factors, including patient preferences, co-morbidities, previous drug use, risk factors not captured in the FRAX model (e.g. falls, vitamin D deficiency, increased bone turnover, interval significant decline in bone density) and possible under - or over-estimation  of fracture risk by FRAX. All patients should ensure an adequate intake of dietary calcium (1200 mg/d) and vitamin D (800 IU daily) unless contraindicated. FOLLOW-UP: People with diagnosed cases of osteoporosis or at high risk for fracture should have regular bone mineral density tests. For patients eligible for Medicare, routine testing is allowed once every 2 years. The testing frequency can be increased to one year for patients who have rapidly progressing disease, those who are receiving or discontinuing medical therapy to restore bone mass, or have additional risk factors. I have reviewed this report, and agree with the above findings. [facility] Radiology Electronically Signed   By: Lovey Newcomer M.D.   On: 05/04/2015 15:48    Labs:  CBC:  Recent Labs   05/18/15 0737  WBC 7.9  HGB 14.3  HCT 45.5  PLT 248    COAGS:  Recent Labs  05/18/15 0737  INR 1.03  APTT 28    BMP:  Recent Labs  05/18/15 0737  NA 135  K 3.8  CL 99*  CO2 30  GLUCOSE 121*  BUN 12  CALCIUM 9.1  CREATININE 1.11*  GFRNONAA 53*  GFRAA >60    LIVER FUNCTION TESTS: No results for input(s): BILITOT, AST, ALT, ALKPHOS, PROT, ALBUMIN in the last 8760 hours.  TUMOR MARKERS: No results for input(s): AFPTM, CEA, CA199, CHROMGRNA in the last 8760 hours.  Assessment and Plan:  Severe worsening back pain sxs since April then suddenly worse since August 2016 Pain meds only work for sleep Fractures at Thoracic 5/6/01/26/09 per outside MRI Now scheduled for vertebroplasty/kyphoplasty Risks and Benefits discussed with the patient including, but not limited to education regarding the natural healing process of compression fractures without intervention, bleeding, infection, cement migration which may cause spinal cord damage, paralysis, pulmonary embolism or even death. All of the patient's questions were answered, patient is agreeable to proceed. Consent signed and in chart.     Thank you for this interesting consult.  I greatly enjoyed meeting Camisha Srey and look forward to participating in their care.  A copy of this report was sent to the requesting provider on this date.  Signed: Tijah Hane A 05/18/2015, 8:08 AM   I spent a total of  30 Minutes   in face to face in clinical consultation, greater than 50% of which was counseling/coordinating care for VP/KP

## 2015-05-18 NOTE — Sedation Documentation (Signed)
Pt reports pain to area, additional pain medication given after another stable BP verified, verbal order for 100 cc bolus of NS as a precaution, will continue to monitor.

## 2015-05-23 ENCOUNTER — Telehealth (HOSPITAL_COMMUNITY): Payer: Self-pay

## 2015-05-23 ENCOUNTER — Other Ambulatory Visit (HOSPITAL_COMMUNITY): Payer: Self-pay | Admitting: Interventional Radiology

## 2015-05-23 DIAGNOSIS — IMO0002 Reserved for concepts with insufficient information to code with codable children: Secondary | ICD-10-CM

## 2015-05-23 NOTE — Telephone Encounter (Signed)
Called to schedule f/u with Dr. Estanislado Pandy, left message for pt to call back. AW

## 2015-06-06 ENCOUNTER — Ambulatory Visit (HOSPITAL_COMMUNITY)
Admission: RE | Admit: 2015-06-06 | Discharge: 2015-06-06 | Disposition: A | Discharge status: Home / Self Care | Source: Ambulatory Visit | Attending: Interventional Radiology | Admitting: Interventional Radiology

## 2015-06-06 DIAGNOSIS — IMO0002 Reserved for concepts with insufficient information to code with codable children: Secondary | ICD-10-CM

## 2015-06-22 ENCOUNTER — Other Ambulatory Visit (HOSPITAL_COMMUNITY): Payer: Self-pay | Admitting: Interventional Radiology

## 2015-06-22 DIAGNOSIS — IMO0002 Reserved for concepts with insufficient information to code with codable children: Secondary | ICD-10-CM

## 2015-07-07 ENCOUNTER — Ambulatory Visit (HOSPITAL_COMMUNITY): Attending: Interventional Radiology

## 2015-07-07 ENCOUNTER — Ambulatory Visit (HOSPITAL_COMMUNITY): Admission: RE | Admit: 2015-07-07 | Source: Ambulatory Visit

## 2015-08-07 ENCOUNTER — Other Ambulatory Visit: Payer: Self-pay | Admitting: Internal Medicine

## 2015-08-07 DIAGNOSIS — Z1231 Encounter for screening mammogram for malignant neoplasm of breast: Secondary | ICD-10-CM

## 2015-08-21 ENCOUNTER — Ambulatory Visit
Admission: RE | Admit: 2015-08-21 | Discharge: 2015-08-21 | Disposition: A | Discharge status: Home / Self Care | Source: Ambulatory Visit | Attending: Internal Medicine | Admitting: Internal Medicine

## 2015-08-21 DIAGNOSIS — Z1231 Encounter for screening mammogram for malignant neoplasm of breast: Secondary | ICD-10-CM

## 2018-05-29 ENCOUNTER — Other Ambulatory Visit: Payer: Self-pay | Admitting: Orthopedic Surgery

## 2018-05-29 DIAGNOSIS — M546 Pain in thoracic spine: Secondary | ICD-10-CM

## 2018-05-29 DIAGNOSIS — M545 Low back pain, unspecified: Secondary | ICD-10-CM

## 2018-06-06 ENCOUNTER — Inpatient Hospital Stay (HOSPITAL_COMMUNITY)
Admission: EM | Admit: 2018-06-06 | Discharge: 2018-06-12 | DRG: 291 | Disposition: A | Discharge status: Home Health | Attending: Internal Medicine | Admitting: Internal Medicine

## 2018-06-06 ENCOUNTER — Encounter (HOSPITAL_COMMUNITY): Payer: Self-pay | Admitting: Emergency Medicine

## 2018-06-06 ENCOUNTER — Emergency Department (HOSPITAL_COMMUNITY)

## 2018-06-06 DIAGNOSIS — Z6836 Body mass index (BMI) 36.0-36.9, adult: Secondary | ICD-10-CM

## 2018-06-06 DIAGNOSIS — J441 Chronic obstructive pulmonary disease with (acute) exacerbation: Secondary | ICD-10-CM | POA: Diagnosis present

## 2018-06-06 DIAGNOSIS — R0602 Shortness of breath: Secondary | ICD-10-CM | POA: Diagnosis not present

## 2018-06-06 DIAGNOSIS — X58XXXA Exposure to other specified factors, initial encounter: Secondary | ICD-10-CM | POA: Diagnosis present

## 2018-06-06 DIAGNOSIS — I10 Essential (primary) hypertension: Secondary | ICD-10-CM | POA: Diagnosis present

## 2018-06-06 DIAGNOSIS — F329 Major depressive disorder, single episode, unspecified: Secondary | ICD-10-CM | POA: Diagnosis present

## 2018-06-06 DIAGNOSIS — M4854XA Collapsed vertebra, not elsewhere classified, thoracic region, initial encounter for fracture: Secondary | ICD-10-CM | POA: Diagnosis present

## 2018-06-06 DIAGNOSIS — J9601 Acute respiratory failure with hypoxia: Secondary | ICD-10-CM | POA: Diagnosis present

## 2018-06-06 DIAGNOSIS — L03311 Cellulitis of abdominal wall: Secondary | ICD-10-CM

## 2018-06-06 DIAGNOSIS — I130 Hypertensive heart and chronic kidney disease with heart failure and stage 1 through stage 4 chronic kidney disease, or unspecified chronic kidney disease: Secondary | ICD-10-CM | POA: Diagnosis present

## 2018-06-06 DIAGNOSIS — E785 Hyperlipidemia, unspecified: Secondary | ICD-10-CM | POA: Diagnosis present

## 2018-06-06 DIAGNOSIS — Z23 Encounter for immunization: Secondary | ICD-10-CM | POA: Diagnosis not present

## 2018-06-06 DIAGNOSIS — Z79891 Long term (current) use of opiate analgesic: Secondary | ICD-10-CM | POA: Diagnosis not present

## 2018-06-06 DIAGNOSIS — R0603 Acute respiratory distress: Secondary | ICD-10-CM

## 2018-06-06 DIAGNOSIS — N183 Chronic kidney disease, stage 3 unspecified: Secondary | ICD-10-CM

## 2018-06-06 DIAGNOSIS — E119 Type 2 diabetes mellitus without complications: Secondary | ICD-10-CM

## 2018-06-06 DIAGNOSIS — M4856XA Collapsed vertebra, not elsewhere classified, lumbar region, initial encounter for fracture: Secondary | ICD-10-CM | POA: Diagnosis present

## 2018-06-06 DIAGNOSIS — I5033 Acute on chronic diastolic (congestive) heart failure: Secondary | ICD-10-CM | POA: Diagnosis present

## 2018-06-06 DIAGNOSIS — F419 Anxiety disorder, unspecified: Secondary | ICD-10-CM | POA: Diagnosis present

## 2018-06-06 DIAGNOSIS — M4850XA Collapsed vertebra, not elsewhere classified, site unspecified, initial encounter for fracture: Secondary | ICD-10-CM | POA: Diagnosis present

## 2018-06-06 DIAGNOSIS — Z7984 Long term (current) use of oral hypoglycemic drugs: Secondary | ICD-10-CM

## 2018-06-06 DIAGNOSIS — Z888 Allergy status to other drugs, medicaments and biological substances status: Secondary | ICD-10-CM | POA: Diagnosis not present

## 2018-06-06 DIAGNOSIS — J811 Chronic pulmonary edema: Secondary | ICD-10-CM

## 2018-06-06 DIAGNOSIS — E1122 Type 2 diabetes mellitus with diabetic chronic kidney disease: Secondary | ICD-10-CM | POA: Diagnosis present

## 2018-06-06 DIAGNOSIS — I509 Heart failure, unspecified: Secondary | ICD-10-CM

## 2018-06-06 DIAGNOSIS — F32A Depression, unspecified: Secondary | ICD-10-CM | POA: Diagnosis present

## 2018-06-06 DIAGNOSIS — Z87891 Personal history of nicotine dependence: Secondary | ICD-10-CM | POA: Diagnosis not present

## 2018-06-06 DIAGNOSIS — Z811 Family history of alcohol abuse and dependence: Secondary | ICD-10-CM | POA: Diagnosis not present

## 2018-06-06 DIAGNOSIS — E669 Obesity, unspecified: Secondary | ICD-10-CM | POA: Diagnosis present

## 2018-06-06 DIAGNOSIS — R0902 Hypoxemia: Secondary | ICD-10-CM

## 2018-06-06 DIAGNOSIS — J438 Other emphysema: Secondary | ICD-10-CM

## 2018-06-06 DIAGNOSIS — Z833 Family history of diabetes mellitus: Secondary | ICD-10-CM | POA: Diagnosis not present

## 2018-06-06 DIAGNOSIS — Z79899 Other long term (current) drug therapy: Secondary | ICD-10-CM

## 2018-06-06 DIAGNOSIS — R0600 Dyspnea, unspecified: Secondary | ICD-10-CM

## 2018-06-06 HISTORY — DX: Chronic obstructive pulmonary disease, unspecified: J44.9

## 2018-06-06 LAB — COMPREHENSIVE METABOLIC PANEL
ALT: 14 U/L (ref 0–44)
AST: 21 U/L (ref 15–41)
Albumin: 3.7 g/dL (ref 3.5–5.0)
Alkaline Phosphatase: 103 U/L (ref 38–126)
Anion gap: 7 (ref 5–15)
BUN: 12 mg/dL (ref 8–23)
CHLORIDE: 102 mmol/L (ref 98–111)
CO2: 26 mmol/L (ref 22–32)
CREATININE: 1.09 mg/dL — AB (ref 0.44–1.00)
Calcium: 9.3 mg/dL (ref 8.9–10.3)
GFR, EST NON AFRICAN AMERICAN: 53 mL/min — AB (ref >60)
Glucose, Bld: 85 mg/dL (ref 70–99)
POTASSIUM: 4.5 mmol/L (ref 3.5–5.1)
SODIUM: 135 mmol/L (ref 135–145)
Total Bilirubin: 0.8 mg/dL (ref 0.3–1.2)
Total Protein: 7.4 g/dL (ref 6.5–8.1)

## 2018-06-06 LAB — GLUCOSE, CAPILLARY
Glucose-Capillary: 258 mg/dL — ABNORMAL HIGH (ref 70–99)
Glucose-Capillary: 221 mg/dL — ABNORMAL HIGH (ref 70–99)

## 2018-06-06 LAB — CBC WITH DIFFERENTIAL/PLATELET
ABS IMMATURE GRANULOCYTES: 0.07 10*3/uL (ref 0.00–0.07)
BASOS PCT: 1 %
Basophils Absolute: 0.1 10*3/uL (ref 0.0–0.1)
EOS ABS: 0.3 10*3/uL (ref 0.0–0.5)
Eosinophils Relative: 3 %
HCT: 39 % (ref 36.0–46.0)
Hemoglobin: 11.9 g/dL — ABNORMAL LOW (ref 12.0–15.0)
IMMATURE GRANULOCYTES: 1 %
Lymphocytes Relative: 19 %
Lymphs Abs: 1.8 10*3/uL (ref 0.7–4.0)
MCH: 29.9 pg (ref 26.0–34.0)
MCHC: 30.5 g/dL (ref 30.0–36.0)
MCV: 98 fL (ref 80.0–100.0)
Monocytes Absolute: 0.6 10*3/uL (ref 0.1–1.0)
Monocytes Relative: 7 %
NEUTROS ABS: 6.6 10*3/uL (ref 1.7–7.7)
NEUTROS PCT: 69 %
Platelets: 315 10*3/uL (ref 150–400)
RBC: 3.98 MIL/uL (ref 3.87–5.11)
RDW: 13.8 % (ref 11.5–15.5)
WBC: 9.5 10*3/uL (ref 4.0–10.5)
nRBC: 0 % (ref 0.0–0.2)

## 2018-06-06 LAB — I-STAT ARTERIAL BLOOD GAS, ED
Acid-base deficit: 4 mmol/L — ABNORMAL HIGH (ref 0.0–2.0)
Bicarbonate: 21.4 mmol/L (ref 20.0–28.0)
O2 Saturation: 94 %
PCO2 ART: 40.2 mmHg (ref 32.0–48.0)
PO2 ART: 76 mmHg — AB (ref 83.0–108.0)
Patient temperature: 98.6
TCO2: 23 mmol/L (ref 22–32)
pH, Arterial: 7.335 — ABNORMAL LOW (ref 7.350–7.450)

## 2018-06-06 LAB — I-STAT CHEM 8, ED
BUN: 15 mg/dL (ref 8–23)
CALCIUM ION: 1.09 mmol/L — AB (ref 1.15–1.40)
Chloride: 103 mmol/L (ref 98–111)
Creatinine, Ser: 1.1 mg/dL — ABNORMAL HIGH (ref 0.44–1.00)
GLUCOSE: 86 mg/dL (ref 70–99)
HCT: 38 % (ref 36.0–46.0)
Hemoglobin: 12.9 g/dL (ref 12.0–15.0)
Potassium: 4.5 mmol/L (ref 3.5–5.1)
SODIUM: 135 mmol/L (ref 135–145)
TCO2: 26 mmol/L (ref 22–32)

## 2018-06-06 LAB — LACTIC ACID, PLASMA
Lactic Acid, Venous: 0.8 mmol/L (ref 0.5–1.9)
Lactic Acid, Venous: 2.1 mmol/L (ref 0.5–1.9)

## 2018-06-06 LAB — I-STAT VENOUS BLOOD GAS, ED
Acid-Base Excess: 4 mmol/L — ABNORMAL HIGH (ref 0.0–2.0)
Bicarbonate: 29.2 mmol/L — ABNORMAL HIGH (ref 20.0–28.0)
O2 Saturation: 46 %
PCO2 VEN: 46.3 mmHg (ref 44.0–60.0)
PO2 VEN: 25 mmHg — AB (ref 32.0–45.0)
TCO2: 31 mmol/L (ref 22–32)
pH, Ven: 7.408 (ref 7.250–7.430)

## 2018-06-06 LAB — I-STAT CG4 LACTIC ACID, ED: Lactic Acid, Venous: 1.08 mmol/L (ref 0.5–1.9)

## 2018-06-06 LAB — TROPONIN I: Troponin I: <0.03 ng/mL

## 2018-06-06 LAB — BRAIN NATRIURETIC PEPTIDE: B Natriuretic Peptide: 84.1 pg/mL (ref 0.0–100.0)

## 2018-06-06 IMAGING — DX DG CHEST 1V PORT
1 series · 1 of 1 positions shown · non-contrast
Comparison: [DATE]

CLINICAL DATA: Shortness of breath and chest pain for 3 days

EXAM:
PORTABLE CHEST 1 VIEW

[chest ap]
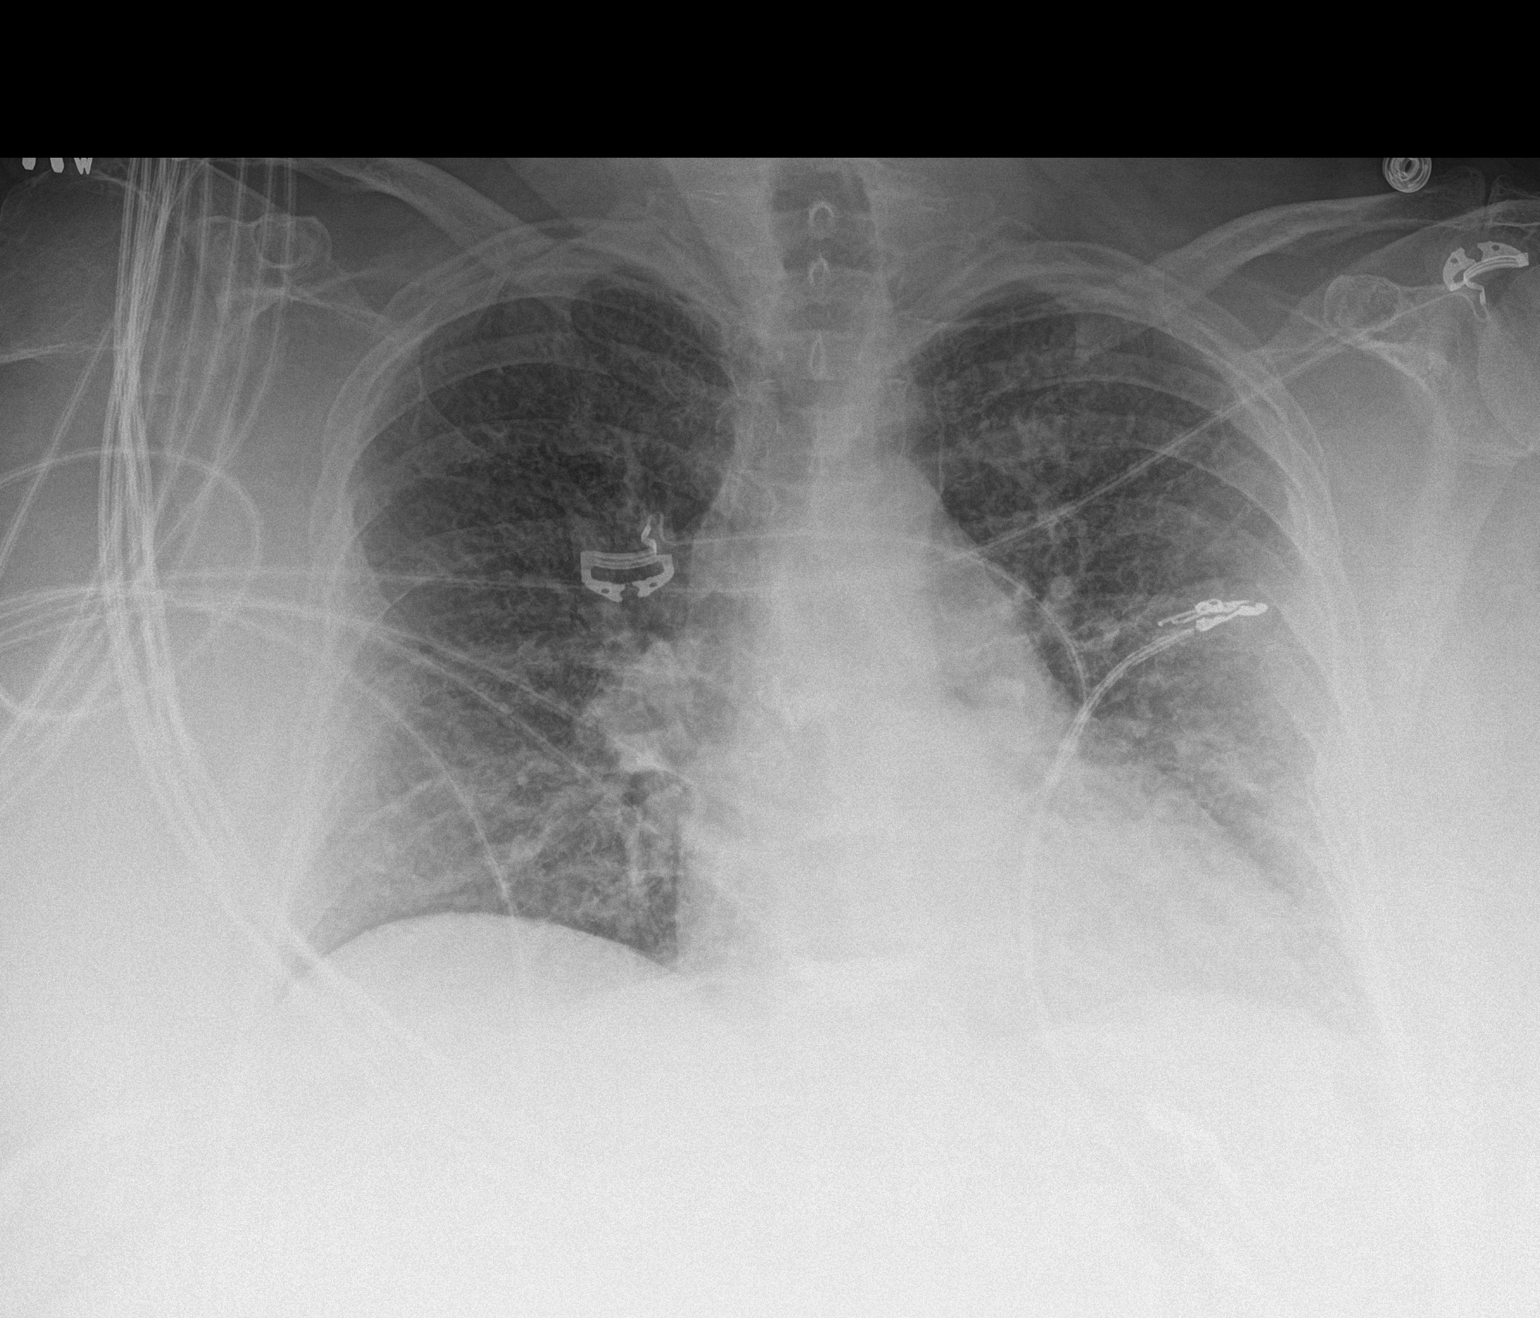

[1 of 1 positions shown; findings below may reference images not displayed]

FINDINGS: Cardiac shadow is at the upper limits of normal in size. The lungs
are well aerated bilaterally. Mild interstitial changes are noted
with vascular congestion consistent with edema. No focal infiltrate
or effusion is noted. No acute bony abnormality is seen. Changes of
prior kyphoplasty are noted.
IMPRESSION: Mild vascular congestion and interstitial edema.

## 2018-06-06 MED ORDER — HYDROXYZINE HCL 25 MG PO TABS
25.0000 mg | ORAL_TABLET | Freq: Three times a day (TID) | ORAL | Status: DC
  Administered 2018-06-07: 25 mg via ORAL
  Filled 2018-06-06: qty 1

## 2018-06-06 MED ORDER — INSULIN ASPART 100 UNIT/ML ~~LOC~~ SOLN
0.0000 [IU] | Freq: Three times a day (TID) | SUBCUTANEOUS | Status: DC
  Administered 2018-06-08 (×2): 3 [IU] via SUBCUTANEOUS
  Administered 2018-06-09: 4 [IU] via SUBCUTANEOUS

## 2018-06-06 MED ORDER — INSULIN ASPART 100 UNIT/ML ~~LOC~~ SOLN: 0.0000 [IU] | Freq: Every day | SUBCUTANEOUS | Status: DC

## 2018-06-06 MED ORDER — INSULIN ASPART 100 UNIT/ML ~~LOC~~ SOLN: 0.0000 [IU] | Freq: Three times a day (TID) | SUBCUTANEOUS | Status: DC

## 2018-06-06 MED ORDER — SODIUM CHLORIDE 0.9 % IV SOLN
1.0000 g | INTRAVENOUS | Status: DC
  Filled 2018-06-06: qty 10

## 2018-06-06 MED ORDER — AMPHETAMINE-DEXTROAMPHETAMINE 10 MG PO TABS
20.0000 mg | ORAL_TABLET | Freq: Three times a day (TID) | ORAL | Status: DC
  Administered 2018-06-07 – 2018-06-08 (×5): 20 mg via ORAL
  Filled 2018-06-06 (×7): qty 2

## 2018-06-06 MED ORDER — ENOXAPARIN SODIUM 40 MG/0.4ML ~~LOC~~ SOLN
40.0000 mg | SUBCUTANEOUS | Status: DC
  Administered 2018-06-06 – 2018-06-11 (×6): 40 mg via SUBCUTANEOUS
  Filled 2018-06-06 (×6): qty 0.4

## 2018-06-06 MED ORDER — ONDANSETRON HCL 4 MG/2ML IJ SOLN
4.0000 mg | Freq: Four times a day (QID) | INTRAMUSCULAR | Status: DC | PRN
  Administered 2018-06-07: 4 mg via INTRAVENOUS
  Filled 2018-06-06: qty 2

## 2018-06-06 MED ORDER — IPRATROPIUM BROMIDE 0.02 % IN SOLN
0.5000 mg | Freq: Once | RESPIRATORY_TRACT | Status: AC
  Administered 2018-06-06: 0.5 mg via RESPIRATORY_TRACT
  Filled 2018-06-06: qty 2.5

## 2018-06-06 MED ORDER — FUROSEMIDE 10 MG/ML IJ SOLN
20.0000 mg | Freq: Two times a day (BID) | INTRAMUSCULAR | Status: DC
  Administered 2018-06-06: 20 mg via INTRAVENOUS
  Filled 2018-06-06: qty 2

## 2018-06-06 MED ORDER — ATORVASTATIN CALCIUM 20 MG PO TABS: 20.0000 mg | ORAL_TABLET | Freq: Every day | ORAL | Status: DC

## 2018-06-06 MED ORDER — CARISOPRODOL 350 MG PO TABS
350.0000 mg | ORAL_TABLET | Freq: Every day | ORAL | Status: DC
  Administered 2018-06-08 – 2018-06-11 (×4): 350 mg via ORAL
  Filled 2018-06-06 (×5): qty 1

## 2018-06-06 MED ORDER — PREGABALIN 50 MG PO CAPS
100.0000 mg | ORAL_CAPSULE | Freq: Two times a day (BID) | ORAL | Status: DC
  Administered 2018-06-07 – 2018-06-12 (×11): 100 mg via ORAL
  Filled 2018-06-06 (×11): qty 2

## 2018-06-06 MED ORDER — MAGNESIUM GLUCONATE 500 MG PO TABS
500.0000 mg | ORAL_TABLET | Freq: Two times a day (BID) | ORAL | Status: DC
  Administered 2018-06-07 – 2018-06-12 (×11): 500 mg via ORAL
  Filled 2018-06-06 (×13): qty 1

## 2018-06-06 MED ORDER — LORAZEPAM 2 MG/ML IJ SOLN
1.0000 mg | Freq: Once | INTRAMUSCULAR | Status: AC
  Administered 2018-06-06: 1 mg via INTRAVENOUS
  Filled 2018-06-06: qty 1

## 2018-06-06 MED ORDER — FUROSEMIDE 10 MG/ML IJ SOLN: 40.0000 mg | Freq: Two times a day (BID) | INTRAMUSCULAR | Status: DC

## 2018-06-06 MED ORDER — ALBUTEROL SULFATE (2.5 MG/3ML) 0.083% IN NEBU
2.5000 mg | INHALATION_SOLUTION | RESPIRATORY_TRACT | Status: DC
  Administered 2018-06-06: 2.5 mg via RESPIRATORY_TRACT
  Filled 2018-06-06: qty 3

## 2018-06-06 MED ORDER — PRAVASTATIN SODIUM 40 MG PO TABS
20.0000 mg | ORAL_TABLET | Freq: Every day | ORAL | Status: DC
  Administered 2018-06-07 – 2018-06-11 (×5): 20 mg via ORAL
  Filled 2018-06-06 (×5): qty 1

## 2018-06-06 MED ORDER — METHOCARBAMOL 500 MG PO TABS: 750.0000 mg | ORAL_TABLET | Freq: Every day | ORAL | Status: DC

## 2018-06-06 MED ORDER — MAGNESIUM SULFATE 2 GM/50ML IV SOLN
2.0000 g | Freq: Once | INTRAVENOUS | Status: AC
  Administered 2018-06-06: 2 g via INTRAVENOUS
  Filled 2018-06-06: qty 50

## 2018-06-06 MED ORDER — METHYLPREDNISOLONE SODIUM SUCC 125 MG IJ SOLR
125.0000 mg | Freq: Once | INTRAMUSCULAR | Status: AC
  Administered 2018-06-06: 125 mg via INTRAVENOUS
  Filled 2018-06-06: qty 2

## 2018-06-06 MED ORDER — SODIUM CHLORIDE 0.9 % IV SOLN: 250.0000 mL | INTRAVENOUS | Status: DC | PRN

## 2018-06-06 MED ORDER — SERTRALINE HCL 100 MG PO TABS
100.0000 mg | ORAL_TABLET | Freq: Two times a day (BID) | ORAL | Status: DC
  Administered 2018-06-07 – 2018-06-12 (×11): 100 mg via ORAL
  Filled 2018-06-06 (×11): qty 1

## 2018-06-06 MED ORDER — SODIUM CHLORIDE 0.9 % IV SOLN
500.0000 mg | INTRAVENOUS | Status: DC
  Administered 2018-06-06 – 2018-06-07 (×2): 500 mg via INTRAVENOUS
  Filled 2018-06-06 (×2): qty 500

## 2018-06-06 MED ORDER — ALBUTEROL SULFATE (2.5 MG/3ML) 0.083% IN NEBU
5.0000 mg | INHALATION_SOLUTION | Freq: Once | RESPIRATORY_TRACT | Status: AC
  Administered 2018-06-06: 5 mg via RESPIRATORY_TRACT
  Filled 2018-06-06: qty 6

## 2018-06-06 MED ORDER — IPRATROPIUM BROMIDE 0.02 % IN SOLN
1.0000 mg | Freq: Once | RESPIRATORY_TRACT | Status: AC
  Administered 2018-06-06: 1 mg via RESPIRATORY_TRACT
  Filled 2018-06-06: qty 5

## 2018-06-06 MED ORDER — OXYCODONE-ACETAMINOPHEN 5-325 MG PO TABS
1.0000 | ORAL_TABLET | Freq: Four times a day (QID) | ORAL | Status: DC | PRN
  Administered 2018-06-07 – 2018-06-12 (×14): 1 via ORAL
  Filled 2018-06-06 (×14): qty 1

## 2018-06-06 MED ORDER — SODIUM CHLORIDE 0.9 % IV SOLN
INTRAVENOUS | Status: DC | PRN
  Administered 2018-06-06: 35 mL via INTRAVENOUS

## 2018-06-06 MED ORDER — OXYCODONE HCL 5 MG PO TABS
5.0000 mg | ORAL_TABLET | Freq: Four times a day (QID) | ORAL | Status: DC | PRN
  Administered 2018-06-07 – 2018-06-12 (×13): 5 mg via ORAL
  Filled 2018-06-06 (×13): qty 1

## 2018-06-06 MED ORDER — IPRATROPIUM-ALBUTEROL 0.5-2.5 (3) MG/3ML IN SOLN: 3.0000 mL | RESPIRATORY_TRACT | Status: DC | PRN

## 2018-06-06 MED ORDER — SODIUM CHLORIDE 0.9 % IV SOLN
100.0000 mg | Freq: Once | INTRAVENOUS | Status: DC
  Administered 2018-06-06: 100 mg via INTRAVENOUS
  Filled 2018-06-06: qty 100

## 2018-06-06 MED ORDER — PANTOPRAZOLE SODIUM 40 MG PO TBEC
40.0000 mg | DELAYED_RELEASE_TABLET | Freq: Every day | ORAL | Status: DC
  Administered 2018-06-07: 40 mg via ORAL
  Filled 2018-06-06: qty 1

## 2018-06-06 MED ORDER — DOXYCYCLINE HYCLATE 100 MG PO TABS: 100.0000 mg | ORAL_TABLET | Freq: Two times a day (BID) | ORAL | Status: DC

## 2018-06-06 MED ORDER — SODIUM CHLORIDE 0.9% FLUSH
3.0000 mL | Freq: Two times a day (BID) | INTRAVENOUS | Status: DC
  Administered 2018-06-06 – 2018-06-12 (×9): 3 mL via INTRAVENOUS

## 2018-06-06 MED ORDER — LAMOTRIGINE 25 MG PO TABS
25.0000 mg | ORAL_TABLET | Freq: Every day | ORAL | Status: DC
  Administered 2018-06-07 – 2018-06-12 (×6): 25 mg via ORAL
  Filled 2018-06-06 (×6): qty 1

## 2018-06-06 MED ORDER — SODIUM CHLORIDE 0.9 % IV SOLN
2.0000 g | Freq: Once | INTRAVENOUS | Status: AC
  Administered 2018-06-06: 2 g via INTRAVENOUS
  Filled 2018-06-06: qty 20

## 2018-06-06 MED ORDER — FUROSEMIDE 10 MG/ML IJ SOLN
40.0000 mg | Freq: Once | INTRAMUSCULAR | Status: AC
  Administered 2018-06-06: 40 mg via INTRAVENOUS
  Filled 2018-06-06: qty 4

## 2018-06-06 MED ORDER — BUSPIRONE HCL 10 MG PO TABS
15.0000 mg | ORAL_TABLET | Freq: Two times a day (BID) | ORAL | Status: DC
  Administered 2018-06-07: 15 mg via ORAL
  Filled 2018-06-06: qty 3
  Filled 2018-06-06 (×2): qty 1.5

## 2018-06-06 MED ORDER — ALBUTEROL SULFATE (2.5 MG/3ML) 0.083% IN NEBU
2.5000 mg | INHALATION_SOLUTION | RESPIRATORY_TRACT | Status: DC
  Administered 2018-06-06 – 2018-06-09 (×15): 2.5 mg via RESPIRATORY_TRACT
  Filled 2018-06-06 (×15): qty 3

## 2018-06-06 MED ORDER — SODIUM CHLORIDE 0.9% FLUSH: 3.0000 mL | INTRAVENOUS | Status: DC | PRN

## 2018-06-06 MED ORDER — LISINOPRIL 2.5 MG PO TABS: 2.5000 mg | ORAL_TABLET | Freq: Every day | ORAL | Status: DC

## 2018-06-06 MED ORDER — OXYCODONE-ACETAMINOPHEN 10-325 MG PO TABS: 1.0000 | ORAL_TABLET | Freq: Four times a day (QID) | ORAL | Status: DC | PRN

## 2018-06-06 MED ORDER — ALPRAZOLAM 0.25 MG PO TABS: 0.5000 mg | ORAL_TABLET | Freq: Three times a day (TID) | ORAL | Status: DC | PRN

## 2018-06-06 MED ORDER — ACETAMINOPHEN 325 MG PO TABS
650.0000 mg | ORAL_TABLET | ORAL | Status: DC | PRN
  Administered 2018-06-07: 325 mg via ORAL
  Administered 2018-06-07: 650 mg via ORAL
  Administered 2018-06-08: 325 mg via ORAL
  Administered 2018-06-08 – 2018-06-11 (×5): 650 mg via ORAL
  Filled 2018-06-06 (×9): qty 2

## 2018-06-06 MED ORDER — ALBUTEROL (5 MG/ML) CONTINUOUS INHALATION SOLN
10.0000 mg/h | INHALATION_SOLUTION | Freq: Once | RESPIRATORY_TRACT | Status: AC
  Administered 2018-06-06: 10 mg/h via RESPIRATORY_TRACT
  Filled 2018-06-06: qty 20

## 2018-06-06 NOTE — Progress Notes (Signed)
RT note: Rt attempted several times to place patient on BIPAP, titration of pressure and encouraging with patient. Patient stated that she could wear it. RN and MD at beside and aware.

## 2018-06-06 NOTE — ED Triage Notes (Signed)
Patient to ED c/o shortness of breath since yesterday - hx COPD not on home O2 - has had inhaler and 4 breathing treatments since 2am this morning with little relief. On arrival, respirations labored, shallow - patient tachypneic and use of accessory muscles noted. Also endorses sharp chest pains with deep inspiration. Denies fevers. She adds that she also has new onset lower abdominal swelling and redness.

## 2018-06-06 NOTE — H&P (Signed)
History and Physical    Theresa Fernandez UDJ:[id number] DOB: November 14, 1955 DOA: 06/06/2018  PCP: Theodis Blaze, MD Consultants:  none Patient coming from: home  Chief Complaint: Shortness of Breath  HPI: Theresa Fernandez is a 62 y.o. female with medical history significant for COPD, hypertension, depression, anxiety day with who presented to the ED this afternoon with complaints of shortness of breath, fatigue, wheezing and cough.  She states she was in her usual state of health until she woke up this morning at 2 AM feeling like she could not catch her breath.  She felt acute anxiety during this episode like she was not going to be able to continue to breathe.  She has had no fever.  She has been coughing with some production of yellow sputum which is new today.  She is used her nebulizer 4 times since 2:00 this morning with minimal relief.  She has had increased bilateral lower extremity edema and increased orthopnea.  She sleeps in a hospital bed with the head elevated.  She states she has gained about 20 pounds in the past 6 to 8 months that she attributes to being less mobile due to compression fractures in her spine.  She does say that she has a history of CHF and had to come into the hospital and get Lasix in the past but we do not have any echocardiograms in our system.  She states that this is how she felt when she was admitted for heart failure in the past.  She chest pain, lightheadedness, palpitations.  She quit smoking a week ago.  She takes Lasix as needed for lower extremity swelling but has not taken this in a while.   ED Course: In the ED she was in obvious respiratory distress with diffuse audible wheezing.  Respiratory rate was 24-34, blood pressure 110s to 130s over 50s to 70s.  Heart rate 99 on arrival, 130s after continuous nebs.  Oxygen saturation 92%- 100% on room air.  She was afebrile. She was tried on BiPAP was but was unable to tolerate it due to anxiety despite Ativan.  Chest  x-ray showed interstitial edema.  Suspect CHF, possible COPD with atypical pneumonia.  She was given continuous nebs at 10 mg an hour, 1 mg of Atrovent neb, 125 mg of methylprednisolone IV, 2 g mag sulfate, 2 g of Rocephin, 100 mg of doxycycline IV, 1 mg of Ativan. Given 40 mg of IV Lasix as well.  ABG showed a pH of 7.335, PCO2 of 40.2, PO2 of 76, bicarb of 21.4.  Review of Systems: As per HPI; otherwise review of systems reviewed and negative.   Ambulatory Status:  Ambulates without assistance  Past Medical History:  Diagnosis Date  . Allergy   . Anxiety   . Arthritis   . COPD (chronic obstructive pulmonary disease) ([place])   . Depression   . Diabetes mellitus without complication ([place])   . HTN (hypertension) 01/12/2013    Past Surgical History:  Procedure Laterality Date  . TUBAL LIGATION      Social History   Socioeconomic History  . Marital status: Married    Spouse name: Not on file  . Number of children: Not on file  . Years of education: Not on file  . Highest education level: Not on file  Occupational History  . Not on file  Social Needs  . Financial resource strain: Not on file  . Food insecurity:    Worry: Not on file    Inability:  Not on file  . Transportation needs:    Medical: Not on file    Non-medical: Not on file  Tobacco Use  . Smoking status: Former Smoker    Packs/day: 1.00    Years: 50.00    Pack years: 50.00    Types: Cigarettes    Last attempt to quit: 05/30/2018    Years since quitting: 0.0  . Smokeless tobacco: Never Used  Substance and Sexual Activity  . Alcohol use: No  . Drug use: No  . Sexual activity: Not on file  Lifestyle  . Physical activity:    Days per week: Not on file    Minutes per session: Not on file  . Stress: Not on file  Relationships  . Social connections:    Talks on phone: Not on file    Gets together: Not on file    Attends religious service: Not on file    Active member of club or organization: Not on file     Attends meetings of clubs or organizations: Not on file    Relationship status: Not on file  . Intimate partner violence:    Fear of current or ex partner: Not on file    Emotionally abused: Not on file    Physically abused: Not on file    Forced sexual activity: Not on file  Other Topics Concern  . Not on file  Social History Narrative  . Not on file    Allergies  Allergen Reactions  . Topamax [Topiramate]   . Voltaren [Diclofenac Sodium]     migraine    Family History  Problem Relation Age of Onset  . Diabetes Mother   . Alcohol abuse Father   . Diabetes Father     Prior to Admission medications   Medication Sig Start Date End Date Taking? Authorizing Provider  albuterol (PROVENTIL HFA;VENTOLIN HFA) 108 (90 BASE) MCG/ACT inhaler Inhale 2 puffs into the lungs every 6 (six) hours as needed for wheezing or shortness of breath (wheezing).    Yes [provider]  albuterol (PROVENTIL) (2.5 MG/3ML) 0.083% nebulizer solution Take 2.5 mg by nebulization every 6 (six) hours as needed for wheezing or shortness of breath (wheezing).    Yes [provider]  amphetamine-dextroamphetamine (ADDERALL) 20 MG tablet Take 1 tablet (20 mg total) by mouth 3 (three) times daily. 01/12/13  Yes Theodis Blaze, MD  busPIRone (BUSPAR) 15 MG tablet Take 15 mg by mouth 2 (two) times daily.    Yes [provider]  carisoprodol (SOMA) 350 MG tablet Take 350 mg by mouth at bedtime. 05/14/18  Yes [provider]  furosemide (LASIX) 20 MG tablet Take 20 mg by mouth as needed. 04/06/18  Yes [provider]  HYDROcodone-acetaminophen (NORCO) 7.5-325 MG tablet Take 1 tablet by mouth every 6 (six) hours as needed. 05/11/15  Yes [provider]  hydrOXYzine (ATARAX/VISTARIL) 25 MG tablet Take 25 mg by mouth 3 (three) times daily. 04/21/18  Yes [provider]  lamoTRIgine (LAMICTAL) 25 MG tablet Take 25 mg by mouth daily. 04/21/18  Yes [provider]  lisinopril (ZESTRIL) 2.5 MG tablet Take 1 tablet (2.5 mg total) by mouth daily. 02/16/13  Yes Johnson, Clanford L, MD  lovastatin (MEVACOR) 20 MG tablet Take 20 mg by mouth daily. 05/22/18  Yes [provider]  magnesium gluconate (MAGONATE) 500 MG tablet Take 1 tablet (500 mg total) by mouth 2 (two) times daily. 01/12/13  Yes Theodis Blaze, MD  methocarbamol (ROBAXIN) 750 MG tablet Take 750 mg by mouth daily. 05/29/18  Yes [provider]  omeprazole (PRILOSEC) 40 MG capsule Take 40 mg by mouth daily.   Yes [provider]  oxyCODONE-acetaminophen (PERCOCET) 10-325 MG tablet Take 1 tablet by mouth every 6 (six) hours as needed. 05/29/18  Yes [provider]  pioglitazone (ACTOS) 30 MG tablet Take 1 tablet (30 mg total) by mouth daily. 02/11/13  Yes Johnson, Clanford L, MD  POTASSIUM CHLORIDE PO Take 595 mg by mouth daily.   Yes [provider]  pregabalin (LYRICA) 150 MG capsule Take 1 capsule (150 mg total) by mouth at bedtime. Patient taking differently: Take 100 mg by mouth 2 (two) times daily.  02/11/13  Yes Johnson, Clanford L, MD  sertraline (ZOLOFT) 100 MG tablet Take 100 mg by mouth 2 (two) times daily.    Yes [provider]  ALPRAZolam (XANAX) 0.5 MG tablet Take 1 tablet (0.5 mg total) by mouth 3 (three) times daily as needed for sleep. Patient not taking: Reported on 06/06/2018 01/12/13   Theodis Blaze, MD  atorvastatin (LIPITOR) 20 MG tablet Take 1 tablet (20 mg total) by mouth daily. Patient not taking: Reported on 06/06/2018 02/11/13   Murlean Iba, MD    Physical Exam: Vitals:   06/06/18 1515 06/06/18 1530 06/06/18 1545 06/06/18 1630  BP: 129/67 135/61 (!) 125/57 (!) 114/56  Pulse: (!) 102 (!) 110 (!) 119 (!) 125  Resp: (!) 24 (!) 24 (!) 28 (!) 34  Temp:      TempSrc:      SpO2: 99% 100% 100% 91%  Weight:      Height:         . General: Obese female.  Patient is in acute distress with significantly increased  work of breathing.  She is audibly wheezing and breathing at a rate of 30 to 34 breaths/min upon my interview.  She does appear to be tiring out.  Drowsy at times. . Eyes:  PERRL, EOMI, normal lids, iris . ENT:  grossly normal hearing, lips & tongue, mmm . Neck:  supple, no lymphadenopathy . Cardiovascular:  nL S1, S2, tachycardic, reg rhythm, no murmur. Marland Kitchen Respiratory: Significantly increased work of breathing, audible wheezing in all fields. . Abdomen: Obese, soft, NT, ND, NABS . Back:   grossly normal alignment . Skin: She has erythema of her lower abdomen, no exudate, no induration.  Otherwise no rash or lesions seen on limited exam . Musculoskeletal:  grossly normal tone BUE/BLE, good ROM, no bony abnormality or obvious joint deformity . Lower extremities:  2+ LE edema.  Limited foot exam with no ulcerations.  2+ distal pulses. Marland Kitchen Psychiatric:  grossly normal mood and affect, speech fluent and appropriate, AOx3 . Neurologic:  CN 2-12 grossly intact, moves all extremities in coordinated fashion, sensation intact, Patellar DTRs 2+ and symmetric    Radiological Exams on Admission: Dg Chest Portable 1 View  Result Date: 06/06/2018 CLINICAL DATA:  Shortness of breath and chest pain for 3 days EXAM: PORTABLE CHEST 1 VIEW COMPARISON:  01/09/2015 FINDINGS: Cardiac shadow is at the upper limits of normal in size. The lungs are well aerated bilaterally. Mild interstitial changes are noted with vascular congestion consistent with edema. No focal infiltrate or effusion is noted. No acute bony abnormality is seen. Changes of prior kyphoplasty are noted. IMPRESSION: Mild vascular congestion and interstitial edema. Electronically Signed   By: Inez Catalina M.D.   On: 06/06/2018 15:48  EKG: Independently reviewed. Date/Time:                  Saturday June 06 2018 15:38:01 EST Ventricular Rate:         114 PR Interval:                   QRS Duration: 98 QT Interval:                 333 QTC  Calculation:        459 R Axis:                         23 Text Interpretation:       Sinus tachycardia Low voltage, precordial leads Nonspecific T wave abnormality   Labs on Admission: I have personally reviewed the available labs and imaging studies at the time of the admission.  Pertinent labs:  Sodium 135 potassium 4.5 chloride 102 CO2 26 BUN 12 creatinine 1.09 glucose 85 calcium 9.3 LFTs within normal limits BNP 84.1 Troponin less than 0.03 Lactic acid 1.08 which trended to 0.8 WBC 9.5 hemoglobin 11.9 platelets 315  Assessment/Plan Principal Problem:   Dyspnea Active Problems:   HTN (hypertension)   Depression   DM2 (diabetes mellitus, type 2) ([facility])   Dyslipidemia   Spinal compression fracture ([facility])   COPD with acute exacerbation ([facility])   CHF exacerbation ([facility])   Pulmonary edema   Cellulitis of abdominal wall  Dyspnea: This is likely a combination of COPD exacerbation and CHF exacerbation with pulmonary edema.  I feel that CHF and pulmonary edema are contributing primarily as her ABG is surprisingly normal.  I feel her wheezing may at least be partly cardiac wheezing.  She is gained 20 pounds in several months, has increased orthopnea, what sounds like PND episode early this morning, increased lower extremity edema.  Her BNP can be falsely low due to her obesity.  At any rate, she appears to have possible impending respiratory failure given her tachypnea and increased work of breathing.  Currently she is doing well on BiPAP and ABG is reassuring. -Admit to stepdown -Continue diuresis with IV Lasix 40 mg twice daily, follow strict I's and O's, daily weights, sodium and fluid restriction, follow BMP and electrolytes -Check TTE -Telemetry monitoring -Continue supplemental oxygen to keep sats above 92% -Continue BiPAP for now -Repeat VBG in about an hour to ensure patient is improving/stable -Scheduled nebs every 4 hours with PRN as well -Continue steroids, start prednisone 40 mg  tomorrow -Start antibiotics for possible CAP: Rocephin 1g IV daily and azithromycin500 mg daily -Respiratory eval and treat, appreciate assistance  Cellulitis, lower abdomen -Ceftriaxone 1 g daily should cover.  Follow clinically.  Hypertension: -Continue lisinopril 2.5 mg daily, consider increasing to 5 mg based on blood pressure trend  Diabetes type 2, non-insulin-dependent -Hold Actos -SSI -Carb controlled diet  Spinal compression fractures -Continue Robaxin, Soma, Lyrica -f/u as outpatient  Hyperlipidemia: Continue statin  Depression/anxiety: -Continue Zoloft, Lamictal, BuSpar,  -cont Xanax as needed   DVT prophylaxis: lovenox Code Status:  Full - confirmed with patient/family Family Communication: none  Disposition Plan:  Home once clinically improved Consults called: RT  Admission status: Admit - It is my clinical opinion that admission to INPATIENT is reasonable and necessary because of the expectation that this patient will require hospital care that crosses at least 2 midnights to treat this condition based on the medical complexity of the problems presented.  Given the aforementioned information, the predictability of an adverse outcome is felt to be significant.     Janora Norlander MD [facility]  If note is complete, please contact covering daytime or nighttime physician. [email or web address] Password [facility]  06/06/2018, 4:43 PM

## 2018-06-06 NOTE — ED Notes (Signed)
Patient placed on BiPAP - tolerating well at this time.

## 2018-06-06 NOTE — ED Provider Notes (Addendum)
[facility] Provider Note   CSN: [id number] Arrival date & time: 06/06/18  1433     History   Chief Complaint Chief Complaint  Patient presents with  . Shortness of Breath    HPI Theresa Fernandez is a 62 y.o. female.  HPI   30 y o F with h/o COPD, HTN, depression, anxiety here with SOB. Pt reports that over the past 1 week she's had mild increase in SOB. Over the past 24 hr, she's developed progressively worsening cough, SOB, and wheezing. She has been using her duonebs every few hours w/o relief. She's had more cough but no sputum production. No fevers. She also notes increased bilateral LE edema and erythema of her lower abdomen w/ swelling. No open wounds. No h/o CHF per her report. No h/o PE. Sx worse with any kind of exertion, no alleviating factors.  Past Medical History:  Diagnosis Date  . Allergy   . Anxiety   . Arthritis   . COPD (chronic obstructive pulmonary disease) ([place])   . Depression   . Diabetes mellitus without complication ([place])   . HTN (hypertension) 01/12/2013    Patient Active Problem List   Diagnosis Date Noted  . Compression fracture   . Knee pain 03/19/2013  . Type II or unspecified type diabetes mellitus without mention of complication, not stated as uncontrolled 02/11/2013  . Muscle cramps 02/11/2013  . Dyslipidemia 02/11/2013  . Healthcare maintenance 02/11/2013  . HTN (hypertension) 01/12/2013  . Depression 01/12/2013    Past Surgical History:  Procedure Laterality Date  . TUBAL LIGATION       OB History   None      Home Medications    Prior to Admission medications   Medication Sig Start Date End Date Taking? Authorizing Provider  albuterol (PROVENTIL HFA;VENTOLIN HFA) 108 (90 BASE) MCG/ACT inhaler Inhale 2 puffs into the lungs every 6 (six) hours as needed for wheezing or shortness of breath (wheezing).    Yes [provider]  albuterol (PROVENTIL) (2.5 MG/3ML) 0.083% nebulizer  solution Take 2.5 mg by nebulization every 6 (six) hours as needed for wheezing or shortness of breath (wheezing).    Yes [provider]  amphetamine-dextroamphetamine (ADDERALL) 20 MG tablet Take 1 tablet (20 mg total) by mouth 3 (three) times daily. 01/12/13  Yes Theodis Blaze, MD  busPIRone (BUSPAR) 15 MG tablet Take 15 mg by mouth 2 (two) times daily.    Yes [provider]  carisoprodol (SOMA) 350 MG tablet Take 350 mg by mouth at bedtime. 05/14/18  Yes [provider]  furosemide (LASIX) 20 MG tablet Take 20 mg by mouth as needed. 04/06/18  Yes [provider]  HYDROcodone-acetaminophen (NORCO) 7.5-325 MG tablet Take 1 tablet by mouth every 6 (six) hours as needed. 05/11/15  Yes [provider]  hydrOXYzine (ATARAX/VISTARIL) 25 MG tablet Take 25 mg by mouth 3 (three) times daily. 04/21/18  Yes [provider]  lamoTRIgine (LAMICTAL) 25 MG tablet Take 25 mg by mouth daily. 04/21/18  Yes [provider]  lisinopril (ZESTRIL) 2.5 MG tablet Take 1 tablet (2.5 mg total) by mouth daily. 02/16/13  Yes Johnson, Clanford L, MD  lovastatin (MEVACOR) 20 MG tablet Take 20 mg by mouth daily. 05/22/18  Yes [provider]  magnesium gluconate (MAGONATE) 500 MG tablet Take 1 tablet (500 mg total) by mouth 2 (two) times daily. 01/12/13  Yes Theodis Blaze, MD  methocarbamol (ROBAXIN) 750 MG tablet Take  750 mg by mouth daily. 05/29/18  Yes [provider]  omeprazole (PRILOSEC) 40 MG capsule Take 40 mg by mouth daily.   Yes [provider]  oxyCODONE-acetaminophen (PERCOCET) 10-325 MG tablet Take 1 tablet by mouth every 6 (six) hours as needed. 05/29/18  Yes [provider]  pioglitazone (ACTOS) 30 MG tablet Take 1 tablet (30 mg total) by mouth daily. 02/11/13  Yes Johnson, Clanford L, MD  POTASSIUM CHLORIDE PO Take 595 mg by mouth daily.   Yes [provider]  pregabalin (LYRICA) 150 MG capsule Take 1 capsule  (150 mg total) by mouth at bedtime. Patient taking differently: Take 100 mg by mouth 2 (two) times daily.  02/11/13  Yes Johnson, Clanford L, MD  sertraline (ZOLOFT) 100 MG tablet Take 100 mg by mouth 2 (two) times daily.    Yes [provider]  ALPRAZolam (XANAX) 0.5 MG tablet Take 1 tablet (0.5 mg total) by mouth 3 (three) times daily as needed for sleep. Patient not taking: Reported on 06/06/2018 01/12/13   Theodis Blaze, MD  atorvastatin (LIPITOR) 20 MG tablet Take 1 tablet (20 mg total) by mouth daily. Patient not taking: Reported on 06/06/2018 02/11/13   Murlean Iba, MD    Family History Family History  Problem Relation Age of Onset  . Diabetes Mother   . Alcohol abuse Father   . Diabetes Father     Social History Social History   Tobacco Use  . Smoking status: Former Smoker    Packs/day: 1.00    Years: 50.00    Pack years: 50.00    Types: Cigarettes    Last attempt to quit: 05/30/2018    Years since quitting: 0.0  . Smokeless tobacco: Never Used  Substance Use Topics  . Alcohol use: No  . Drug use: No     Allergies   Topamax [topiramate] and Voltaren [diclofenac sodium]   Review of Systems Review of Systems  Constitutional: Positive for fatigue. Negative for chills and fever.  HENT: Negative for congestion and rhinorrhea.   Eyes: Negative for visual disturbance.  Respiratory: Positive for cough, shortness of breath and wheezing.   Cardiovascular: Negative for chest pain and leg swelling.  Gastrointestinal: Negative for abdominal pain, diarrhea, nausea and vomiting.  Genitourinary: Negative for dysuria and flank pain.  Musculoskeletal: Negative for neck pain and neck stiffness.  Skin: Positive for rash. Negative for wound.  Allergic/Immunologic: Negative for immunocompromised state.  Neurological: Positive for weakness. Negative for syncope and headaches.  All other systems reviewed and are negative.    Physical Exam Updated Vital Signs BP  135/61   Pulse (!) 110   Temp 97.6 F (36.4 C) (Oral)   Resp (!) 24   Ht 5\' 8"  (1.727 m)   Wt 113.4 kg   SpO2 100%   BMI 38.01 kg/m   Physical Exam  Constitutional: She is oriented to person, place, and time. She appears well-developed and well-nourished. She appears ill. No distress.  HENT:  Head: Normocephalic and atraumatic.  Eyes: Conjunctivae are normal.  Neck: Neck supple.  Cardiovascular: Normal rate, regular rhythm and normal heart sounds. Exam reveals no friction rub.  No murmur heard. Pulmonary/Chest: Effort normal. No respiratory distress. She has decreased breath sounds. She has wheezes in the right upper field, the right middle field, the right lower field, the left upper field, the left middle field and the left lower field. She has no rales.  Abdominal: She exhibits no distension.  Mild distension,  abd wall edema, cellulitis of lower abd wall with erythema, no induration or fluctuance  Musculoskeletal:       Right lower leg: She exhibits edema.       Left lower leg: She exhibits edema.  Neurological: She is alert and oriented to person, place, and time. She exhibits normal muscle tone.  Skin: Skin is warm. Capillary refill takes less than 2 seconds.  Psychiatric: She has a normal mood and affect.  Nursing note and vitals reviewed.    ED Treatments / Results  Labs (all labs ordered are listed, but only abnormal results are displayed) Labs Reviewed  CBC WITH DIFFERENTIAL/PLATELET - Abnormal; Notable for the following components:      Result Value   Hemoglobin 11.9 (*)    All other components within normal limits  I-STAT VENOUS BLOOD GAS, ED - Abnormal; Notable for the following components:   pO2, Ven 25.0 (*)    Bicarbonate 29.2 (*)    Acid-Base Excess 4.0 (*)    All other components within normal limits  I-STAT CHEM 8, ED - Abnormal; Notable for the following components:   Creatinine, Ser 1.10 (*)    Calcium, Ion 1.09 (*)    All other components within  normal limits  CULTURE, BLOOD (ROUTINE X 2)  CULTURE, BLOOD (ROUTINE X 2)  BRAIN NATRIURETIC PEPTIDE  COMPREHENSIVE METABOLIC PANEL  TROPONIN I  LACTIC ACID, PLASMA  LACTIC ACID, PLASMA  I-STAT CG4 LACTIC ACID, ED    EKG EKG Interpretation  Date/Time:  Saturday June 06 2018 15:38:01 EST Ventricular Rate:  114 PR Interval:    QRS Duration: 98 QT Interval:  333 QTC Calculation: 459 R Axis:   23 Text Interpretation:  Sinus tachycardia Low voltage, precordial leads Nonspecific T wave abnormality Confirmed by Lajean Saver [phone]) on 06/06/2018 3:40:35 PM   Radiology Dg Chest Portable 1 View  Result Date: 06/06/2018 CLINICAL DATA:  Shortness of breath and chest pain for 3 days EXAM: PORTABLE CHEST 1 VIEW COMPARISON:  01/09/2015 FINDINGS: Cardiac shadow is at the upper limits of normal in size. The lungs are well aerated bilaterally. Mild interstitial changes are noted with vascular congestion consistent with edema. No focal infiltrate or effusion is noted. No acute bony abnormality is seen. Changes of prior kyphoplasty are noted. IMPRESSION: Mild vascular congestion and interstitial edema. Electronically Signed   By: Inez Catalina M.D.   On: 06/06/2018 15:48    Procedures .Critical Care Performed by: Duffy Bruce, MD Authorized by: Duffy Bruce, MD   Critical care provider statement:    Critical care time (minutes):  35   Critical care time was exclusive of:  Separately billable procedures and treating other patients and teaching time   Critical care was necessary to treat or prevent imminent or life-threatening deterioration of the following conditions:  Cardiac failure, sepsis and circulatory failure   Critical care was time spent personally by me on the following activities:  Development of treatment plan with patient or surrogate, discussions with consultants, evaluation of patient's response to treatment, examination of patient, obtaining history from patient or  surrogate, ordering and performing treatments and interventions, ordering and review of laboratory studies, ordering and review of radiographic studies, pulse oximetry, re-evaluation of patient's condition and review of old charts   I assumed direction of critical care for this patient from another provider in my specialty: no     (including critical care time)  Medications Ordered in ED Medications  cefTRIAXone (ROCEPHIN) 2 g in sodium chloride 0.9 %  100 mL IVPB (2 g Intravenous New Bag/Given 06/06/18 1531)  magnesium sulfate IVPB 2 g 50 mL (2 g Intravenous New Bag/Given 06/06/18 1532)  doxycycline (VIBRAMYCIN) 100 mg in sodium chloride 0.9 % 250 mL IVPB (has no administration in time range)  furosemide (LASIX) injection 40 mg (has no administration in time range)  albuterol (PROVENTIL,VENTOLIN) solution continuous neb (10 mg/hr Nebulization Given 06/06/18 1452)  ipratropium (ATROVENT) nebulizer solution 1 mg (1 mg Nebulization Given 06/06/18 1452)  methylPREDNISolone sodium succinate (SOLU-MEDROL) 125 mg/2 mL injection 125 mg (125 mg Intravenous Given 06/06/18 1452)  LORazepam (ATIVAN) injection 1 mg (1 mg Intravenous Given 06/06/18 1452)     Initial Impression / Assessment and Plan / ED Course  I have reviewed the triage vital signs and the nursing notes.  Pertinent labs & imaging results that were available during my care of the patient were reviewed by me and considered in my medical decision making (see chart for details).     62 yo M here with SOB. Pt in obvious resp distress on arrival with diffuse wheezing. BIPAP trialed but pt unable to tolerate 2/2 anxiety, despite dose of ativan. Suspect COPD versus CHF. She has some abd wall edema as well and mild erythema, will trial dose of Rocephin for non-purulent cellulitis as well as possible PNA. Nebs, steroids ordered.  CXR c/w interstitial edema. Lasix given. Suspect CHF, possible COPD w/ atypical PNA. ABX, steroids, lasix given. Plan  to f/u labs, likely admit.  Patient care transferred to Dr. Ashok Cordia at the end of my shift. Patient presentation, ED course, and plan of care discussed with review of all pertinent labs and imaging. Please see his/her note for further details regarding further ED course and disposition.    Final Clinical Impressions(s) / ED Diagnoses   Final diagnoses:  Abdominal wall cellulitis  COPD exacerbation ([place])  Acute on chronic congestive heart failure, unspecified heart failure type [facility])    ED Discharge Orders    None       Duffy Bruce, MD 06/06/18 1556    Duffy Bruce, MD 06/17/18 1642

## 2018-06-06 NOTE — Progress Notes (Signed)
RT note: RT and RN transported patient on BIPAP to [phone]. Vital signs stable through out.

## 2018-06-06 NOTE — ED Provider Notes (Signed)
Patient signed out by Dr Ellender Hose with sob/resp distress.  Pt with hx copd, diffuse wheezing on exam. Additional albuterol and atrovent nebs. cxr does show mild vascular congestion, however bnp is normal, bp is well controlled. Pt has received iv lasix.   I suspect dyspnea is multifactorial, but currently seems primarily related to copd.  Pt has received iv steroids. Will consult unassigned medicine for admission (pcp is [employer]).      Lajean Saver, MD 06/06/18 [phone]

## 2018-06-07 ENCOUNTER — Other Ambulatory Visit: Payer: Self-pay

## 2018-06-07 LAB — CBC
HCT: 36.2 % (ref 36.0–46.0)
Hemoglobin: 10.6 g/dL — ABNORMAL LOW (ref 12.0–15.0)
MCH: 29 pg (ref 26.0–34.0)
MCHC: 29.3 g/dL — ABNORMAL LOW (ref 30.0–36.0)
MCV: 98.9 fL (ref 80.0–100.0)
Platelets: 335 10*3/uL (ref 150–400)
RBC: 3.66 MIL/uL — ABNORMAL LOW (ref 3.87–5.11)
RDW: 14.1 % (ref 11.5–15.5)
WBC: 12.5 10*3/uL — ABNORMAL HIGH (ref 4.0–10.5)
nRBC: 0 % (ref 0.0–0.2)

## 2018-06-07 LAB — BASIC METABOLIC PANEL
Anion gap: 12 (ref 5–15)
BUN: 18 mg/dL (ref 8–23)
CO2: 22 mmol/L (ref 22–32)
Calcium: 9 mg/dL (ref 8.9–10.3)
Chloride: 104 mmol/L (ref 98–111)
Creatinine, Ser: 1.36 mg/dL — ABNORMAL HIGH (ref 0.44–1.00)
GFR calc Af Amer: 47 mL/min — ABNORMAL LOW (ref >60)
GFR calc non Af Amer: 40 mL/min — ABNORMAL LOW (ref >60)
Glucose, Bld: 115 mg/dL — ABNORMAL HIGH (ref 70–99)
Potassium: 4 mmol/L (ref 3.5–5.1)
Sodium: 138 mmol/L (ref 135–145)

## 2018-06-07 LAB — GLUCOSE, CAPILLARY
Glucose-Capillary: 100 mg/dL — ABNORMAL HIGH (ref 70–99)
Glucose-Capillary: 105 mg/dL — ABNORMAL HIGH (ref 70–99)
Glucose-Capillary: 110 mg/dL — ABNORMAL HIGH (ref 70–99)
Glucose-Capillary: 171 mg/dL — ABNORMAL HIGH (ref 70–99)
Glucose-Capillary: 83 mg/dL (ref 70–99)

## 2018-06-07 LAB — MRSA PCR SCREENING: MRSA by PCR: NEGATIVE

## 2018-06-07 MED ORDER — INFLUENZA VAC SPLIT QUAD 0.5 ML IM SUSY
0.5000 mL | PREFILLED_SYRINGE | INTRAMUSCULAR | Status: AC
  Administered 2018-06-08: 0.5 mL via INTRAMUSCULAR

## 2018-06-07 MED ORDER — FAMOTIDINE IN NACL 20-0.9 MG/50ML-% IV SOLN
20.0000 mg | Freq: Once | INTRAVENOUS | Status: AC
  Administered 2018-06-07: 20 mg via INTRAVENOUS
  Filled 2018-06-07: qty 50

## 2018-06-07 MED ORDER — METHYLPREDNISOLONE SODIUM SUCC 125 MG IJ SOLR
60.0000 mg | Freq: Two times a day (BID) | INTRAMUSCULAR | Status: DC
  Administered 2018-06-07 – 2018-06-08 (×3): 60 mg via INTRAVENOUS
  Filled 2018-06-07 (×3): qty 2

## 2018-06-07 MED ORDER — DIPHENHYDRAMINE HCL 25 MG PO CAPS
25.0000 mg | ORAL_CAPSULE | Freq: Four times a day (QID) | ORAL | Status: DC | PRN
  Administered 2018-06-07 – 2018-06-10 (×3): 25 mg via ORAL
  Filled 2018-06-07 (×3): qty 1

## 2018-06-07 MED ORDER — FAMOTIDINE 20 MG PO TABS
20.0000 mg | ORAL_TABLET | Freq: Two times a day (BID) | ORAL | Status: DC
  Administered 2018-06-07 – 2018-06-12 (×10): 20 mg via ORAL
  Filled 2018-06-07 (×10): qty 1

## 2018-06-07 MED ORDER — SODIUM CHLORIDE 0.9 % IV SOLN
100.0000 mg | Freq: Two times a day (BID) | INTRAVENOUS | Status: DC
  Administered 2018-06-07: 100 mg via INTRAVENOUS
  Filled 2018-06-07 (×2): qty 100

## 2018-06-07 MED ORDER — PNEUMOCOCCAL VAC POLYVALENT 25 MCG/0.5ML IJ INJ
0.5000 mL | INJECTION | INTRAMUSCULAR | Status: AC
  Administered 2018-06-08: 0.5 mL via INTRAMUSCULAR

## 2018-06-07 NOTE — Progress Notes (Signed)
Pt trial off Bipap since 0530. Currently 3.5L/[place] and tolerating well. Sats mid 90's, RR 22 HR 90's to low 100. Will continue to monitor. Jessie Foot, RN

## 2018-06-07 NOTE — Evaluation (Signed)
Physical Therapy Evaluation Patient Details Name: Theresa Fernandez MRN: [id number] DOB: Jul 17, 1956 Today's Date: 06/07/2018   History of Present Illness  Pt is a 62 y.o. female with PMH significant for COPD, HTN, depression, anxiety with who presented to the ED with complaints of SOB, fatigue, wheezing, cough, BLE edema, increased orthopenea.    Clinical Impression  Pt admitted with above diagnosis. Pt currently with functional limitations due to the deficits listed below (see PT Problem List). PTA pt living at home with family, household ambulation with RW. Today patient transfer to bedside chair with supervision, fatigues quickly feel HHPT would be beneficial work on activity tolerance.  Pt will benefit from skilled PT to increase their independence and safety with mobility to allow discharge to the venue listed below.       Follow Up Recommendations Home health PT;Supervision for mobility/OOB    Equipment Recommendations  None recommended by PT    Recommendations for Other Services       Precautions / Restrictions Precautions Precautions: Fall Precaution Comments: chronic compression fractures in back Restrictions Weight Bearing Restrictions: No      Mobility  Bed Mobility Overal bed mobility: Needs Assistance Bed Mobility: Supine to Sit     Supine to sit: Min guard;HOB elevated     General bed mobility comments: no physical assist needed  Transfers Overall transfer level: Needs assistance Equipment used: Rolling walker (2 wheeled) Transfers: Sit to/from [facility] Sit to Stand: Min guard;From elevated surface Stand pivot transfers: Min guard       General transfer comment: min guard for safety  Ambulation/Gait                Stairs            Wheelchair Mobility    Modified Rankin (Stroke Patients Only)       Balance Overall balance assessment: Mild deficits observed, not formally tested                                           Pertinent Vitals/Pain Pain Assessment: No/denies pain    Home Living Family/patient expects to be discharged to:: Private residence Living Arrangements: Spouse/significant other;Other relatives(sister bro in law mother pets) Available Help at Discharge: Family;Available 24 hours/day Type of Home: House Home Access: Stairs to enter Entrance Stairs-Rails: None Entrance Stairs-Number of Steps: 1 Home Layout: One level Home Equipment: Hospital bed;Grab bars - toilet;Grab bars - tub/shower;Hand held shower head;Shower seat;Bedside commode;Walker - 2 wheels;Wheelchair - Insurance risk surveyor Comments: 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    Prior Function Level of Independence: Independent with assistive device(s)         Comments: uses RW and DME at baseline, does not drive (sister or husband does)     Hand Dominance   Dominant Hand: Right    Extremity/Trunk Assessment   Upper Extremity Assessment Upper Extremity Assessment: Overall WFL for tasks assessed    Lower Extremity Assessment Lower Extremity Assessment: Overall WFL for tasks assessed    Cervical / Trunk Assessment Cervical / Trunk Assessment: Other exceptions Cervical / Trunk Exceptions: hx of compression fractures and morbid obesity  Communication   Communication: No difficulties  Cognition Arousal/Alertness: Awake/alert Behavior During Therapy: WFL for tasks assessed/performed Overall Cognitive Status: Within Functional Limits for tasks assessed  General Comments      Exercises     Assessment/Plan    PT Assessment Patient needs continued PT services  PT Problem List Decreased activity tolerance;Cardiopulmonary status limiting activity       PT Treatment Interventions Gait training;DME instruction;Stair training;Functional mobility training;Therapeutic activities;Therapeutic exercise    PT  Goals (Current goals can be found in the Care Plan section)  Acute Rehab PT Goals Patient Stated Goal: "get back to where I can help my family" PT Goal Formulation: With patient Time For Goal Achievement: 06/21/18 Potential to Achieve Goals: Fair    Frequency Min 3X/week   Barriers to discharge        Co-evaluation PT/OT/SLP Co-Evaluation/Treatment: Yes Reason for Co-Treatment: For patient/therapist safety;To address functional/ADL transfers PT goals addressed during session: Mobility/safety with mobility;Balance;Proper use of DME;Strengthening/ROM         AM-PAC PT "6 Clicks" Daily Activity  Outcome Measure Difficulty turning over in bed (including adjusting bedclothes, sheets and blankets)?: A Little Difficulty moving from lying on back to sitting on the side of the bed? : A Little Difficulty sitting down on and standing up from a chair with arms (e.g., wheelchair, bedside commode, etc,.)?: A Little Help needed moving to and from a bed to chair (including a wheelchair)?: A Little Help needed walking in hospital room?: A Little Help needed climbing 3-5 steps with a railing? : A Lot 6 Click Score: 17    End of Session Equipment Utilized During Treatment: Gait belt;Oxygen Activity Tolerance: Patient tolerated treatment well   Nurse Communication: Mobility status PT Visit Diagnosis: Unsteadiness on feet (R26.81)    Time: 9295-7473 PT Time Calculation (min) (ACUTE ONLY): 26 min   Charges:   PT Evaluation $PT Eval Moderate Complexity: 1 Mod          Reinaldo Berber, PT, DPT Acute Rehabilitation Services Pager: [phone] Office: [phone]     Reinaldo Berber 06/07/2018, 3:03 PM

## 2018-06-07 NOTE — Progress Notes (Signed)
The patient was started with her Rocephin dose and had a reaction of flushing over chest and neck, feeling flushed/hot. Medication stopped MD Posey Pronto notified. New orders placed. Notified Pharmacy. I will continue to monitor the patient closely.   Saddie Benders RN

## 2018-06-07 NOTE — Progress Notes (Signed)
[facility] Progress Note  Patient: Theresa Fernandez FGH:[id number]   PCP: Theodis Blaze, MD DOB: [birth year]   DOA: 06/06/2018   DOS: 06/07/2018   Date of Service: the patient was seen and examined on 06/07/2018  Brief hospital course: Theresa Fernandez is a 62 y.o. female with medical history significant for COPD, hypertension, depression, anxiety day with who presented to the ED this afternoon with complaints of shortness of breath, fatigue, wheezing and cough.  She states she was in her usual state of health until she woke up this morning at 2 AM feeling like she could not catch her breath.  She felt acute anxiety during this episode like she was not going to be able to continue to breathe.  She has had no fever.  She has been coughing with some production of yellow sputum which is new today.  She is used her nebulizer 4 times since 2:00 this morning with minimal relief.  She has had increased bilateral lower extremity edema and increased orthopnea.  She sleeps in a hospital bed with the head elevated.  She states she has gained about 20 pounds in the past 6 to 8 months that she attributes to being less mobile due to compression fractures in her spine.  She does say that she has a history of CHF and had to come into the hospital and get Lasix in the past but we do not have any echocardiograms in our system.  She states that this is how she felt when she was admitted for heart failure in the past.  She chest pain, lightheadedness, palpitations.  She quit smoking a week ago.  She takes Lasix as needed for lower extremity swelling but has not taken this in a while. Currently further plan is continue BiPAP support and monitor blood pressure.  Subjective: Feeling fatigue and tired with no nausea no vomiting no fever no chills.  Assessment and Plan: This is likely a combination of COPD exacerbation and CHF exacerbation with pulmonary edema.  I feel that CHF and pulmonary edema are contributing  primarily as her ABG is surprisingly normal.  I feel her wheezing may at least be partly cardiac wheezing.  She is gained 20 pounds in several months, has increased orthopnea, what sounds like PND episode early this morning, increased lower extremity edema.  Her BNP can be falsely low due to her obesity.  At any rate, she appears to have possible impending respiratory failure given her tachypnea and increased work of breathing.  Currently she is doing well on BiPAP and ABG is reassuring. -Admit to stepdown -Hold off on diuresis -Check TTE -Telemetry monitoring -Continue supplemental oxygen to keep sats above 92% -Continue BiPAP for now nightly -Scheduled nebs every 4 hours with PRN as well -Continue steroids, -Start antibiotics for possible CAP: Rocephin 1g IV daily and azithromycin500 mg daily -Respiratory eval and treat, appreciate assistance  Cellulitis, lower abdomen -Ceftriaxone 1 g daily should cover.  Follow clinically.  Hypertension: -Continue lisinopril 2.5 mg daily, consider increasing to 5 mg based on blood pressure trend  Diabetes type 2, non-insulin-dependent -Hold Actos -Carb controlled diet Continue sliding scale insulin  Spinal compression fractures -Continue Robaxin, Soma, Lyrica -f/u as outpatient  Hyperlipidemia: Continue statin  Depression/anxiety: -Continue Zoloft, Lamictal, BuSpar,  -cont Xanax as needed  Diet: cardiac diet DVT Prophylaxis: subcutaneous Heparin  Advance goals of care discussion: full code  Family Communication: no family was present at bedside, at the time of interview.   Disposition:  Discharge to  home.  Consultants: none Procedures: noen  Scheduled Meds: . albuterol  2.5 mg Nebulization Q4H  . amphetamine-dextroamphetamine  20 mg Oral TID  . carisoprodol  350 mg Oral QHS  . enoxaparin (LOVENOX) injection  40 mg Subcutaneous Q24H  . famotidine  20 mg Oral BID  . [START ON 06/08/2018] Influenza vac split quadrivalent PF   0.5 mL Intramuscular Tomorrow-1000  . insulin aspart  0-20 Units Subcutaneous TID WC  . insulin aspart  0-5 Units Subcutaneous QHS  . lamoTRIgine  25 mg Oral Daily  . magnesium gluconate  500 mg Oral BID  . [START ON 06/08/2018] pneumococcal 23 valent vaccine  0.5 mL Intramuscular Tomorrow-1000  . pravastatin  20 mg Oral q1800  . pregabalin  100 mg Oral BID  . sertraline  100 mg Oral BID  . sodium chloride flush  3 mL Intravenous Q12H   Continuous Infusions: . sodium chloride Stopped (06/06/18 2052)  . sodium chloride    . azithromycin Stopped (06/06/18 2254)  . doxycycline (VIBRAMYCIN) IV Stopped (06/07/18 1830)   PRN Meds: sodium chloride, sodium chloride, acetaminophen, ALPRAZolam, diphenhydrAMINE, ipratropium-albuterol, ondansetron (ZOFRAN) IV, oxyCODONE-acetaminophen **AND** oxyCODONE, sodium chloride flush Antibiotics: Anti-infectives (From admission, onward)   Start     Dose/Rate Route Frequency Ordered Stop   06/07/18 1800  doxycycline (VIBRAMYCIN) 100 mg in sodium chloride 0.9 % 250 mL IVPB     100 mg 125 mL/hr over 120 Minutes Intravenous Every 12 hours 06/07/18 1346     06/07/18 1200  cefTRIAXone (ROCEPHIN) 1 g in sodium chloride 0.9 % 100 mL IVPB  Status:  Discontinued     1 g 200 mL/hr over 30 Minutes Intravenous Every 24 hours 06/06/18 1813 06/07/18 1343   06/06/18 2200  doxycycline (VIBRA-TABS) tablet 100 mg  Status:  Discontinued     100 mg Oral Every 12 hours 06/06/18 1733 06/06/18 1810   06/06/18 2000  azithromycin (ZITHROMAX) 500 mg in sodium chloride 0.9 % 250 mL IVPB     500 mg 250 mL/hr over 60 Minutes Intravenous Every 24 hours 06/06/18 1813     06/06/18 1630  doxycycline (VIBRAMYCIN) 100 mg in sodium chloride 0.9 % 250 mL IVPB  Status:  Discontinued     100 mg 125 mL/hr over 120 Minutes Intravenous  Once 06/06/18 1551 06/06/18 1900   06/06/18 1530  cefTRIAXone (ROCEPHIN) 2 g in sodium chloride 0.9 % 100 mL IVPB     2 g 200 mL/hr over 30 Minutes Intravenous   Once 06/06/18 1515 06/06/18 1607       Objective: Physical Exam: Vitals:   06/07/18 1304 06/07/18 1634 06/07/18 1639 06/07/18 1927  BP: (!) 139/110  92/69 (!) 131/106  Pulse:   87 98  Resp: 18     Temp: 98.4 F (36.9 C)  98.5 F (36.9 C) 98.5 F (36.9 C)  TempSrc: Oral  Oral Oral  SpO2: 96% 98% 100% 93%  Weight:      Height:        Intake/Output Summary (Last 24 hours) at 06/07/2018 1928 Last data filed at 06/07/2018 1641 Gross per 24 hour  Intake 261.62 ml  Output 900 ml  Net -638.38 ml   Filed Weights   06/06/18 1456  Weight: 113.4 kg   General: Alert, Awake and Oriented to Time, Place and Person. Appear in moderate distress, affect appropriate Eyes: PERRL, Conjunctiva normal ENT: Oral Mucosa clear moist. Neck: no JVD, no Abnormal Mass Or lumps Cardiovascular: S1 and S2 Present, no Murmur, Peripheral  Pulses Present Respiratory: normal respiratory effort, Bilateral Air entry equal and Decreased, no use of accessory muscle, Clear to Auscultation, no Crackles, Occasional  wheezes Abdomen: Bowel Sound present, Soft and no tenderness, n hernia Skin: ono redness, no Rash, no induration Extremities: no Pedal edema, no calf tenderness Neurologic: Grossly no focal neuro deficit. Bilaterally Equal motor strength  Data Reviewed: CBC: Recent Labs  Lab 06/06/18 1458 06/06/18 1508 06/07/18 0511  WBC 9.5  --  12.5*  NEUTROABS 6.6  --   --   HGB 11.9* 12.9 10.6*  HCT 39.0 38.0 36.2  MCV 98.0  --  98.9  PLT 315  --  300   Basic Metabolic Panel: Recent Labs  Lab 06/06/18 1458 06/06/18 1508 06/07/18 0511  NA 135 135 138  K 4.5 4.5 4.0  CL 102 103 104  CO2 26  --  22  GLUCOSE 85 86 115*  BUN 12 15 18   CREATININE 1.09* 1.10* 1.36*  CALCIUM 9.3  --  9.0    Liver Function Tests: Recent Labs  Lab 06/06/18 1458  AST 21  ALT 14  ALKPHOS 103  BILITOT 0.8  PROT 7.4  ALBUMIN 3.7   No results for input(s): LIPASE, AMYLASE in the last 168 hours. No results for  input(s): AMMONIA in the last 168 hours. Coagulation Profile: No results for input(s): INR, PROTIME in the last 168 hours. Cardiac Enzymes: Recent Labs  Lab 06/06/18 1458  TROPONINI <0.03   BNP (last 3 results) No results for input(s): PROBNP in the last 8760 hours. CBG: Recent Labs  Lab 06/06/18 2137 06/07/18 0040 06/07/18 0735 06/07/18 1113 06/07/18 1637  GLUCAP 221* 171* 100* 110* 83   Studies: No results found.   Time spent: 35 minutes  Author: Berle Mull, MD [facility] Pager: [phone] 06/07/2018 7:28 PM  Between 7PM-7AM, please contact night-coverage at [email or web address], password [facility]

## 2018-06-07 NOTE — Care Management Note (Signed)
Case Management Note  Patient Details  Name: Dru Laurel MRN: [id number] Date of Birth: Aug 24, 1955  Subjective/Objective:                 COPD CHF   Action/Plan:  Spoke w patient and spouse at bedside. Patient is from home with spouse, and extended family. Prior to admission, patient states she never drove, her spouse provides transport to appointments etc. She states that she used a RW at home. She also provides that her mother who lives with them has a fully equipped handicapped bathroom. She has a nebulizer, and is not oxygen dependent, she does however have acute oxygen needs this admission.  She denies barriers to medication access. She states she has PCP, Mart Piggs, but is not currently followed by a cardiologist nor a pulmonologist. PT OT evals pending. CM will continue to follow.   Expected Discharge Date:                  Expected Discharge Plan:  [place]  In-House Referral:     Discharge planning Services  CM Consult  Post Acute Care Choice:    Choice offered to:     DME Arranged:    DME Agency:     HH Arranged:    [place] Agency:     Status of Service:  In process, will continue to follow  If discussed at Long Length of Stay Meetings, dates discussed:    Additional Comments:  Carles Collet, RN 06/07/2018, 10:17 AM

## 2018-06-07 NOTE — Evaluation (Signed)
Occupational Therapy Evaluation Patient Details Name: Theresa Fernandez MRN: [id number] DOB: 03-07-1956 Today's Date: 06/07/2018    History of Present Illness Pt is a 62 y.o. female with PMH significant for COPD, HTN, depression, anxiety with who presented to the ED with complaints of SOB, fatigue, wheezing, cough, BLE edema, increased orthopenea.   Clinical Impression   PTA Pt mod I at home in ADL and mobility with RW. Pt is currently requiring mod A for LB ADL, demonstrating decreased activity tolerance, decreased balance, and edema. Pt will requires skilled OT in the acute setting, and 24 hour assist initially when home. Pt has appropriate DME. Next session will focus on AE for LB ADL as well as energy conservation education.     Follow Up Recommendations  Supervision/Assistance - 24 hour    Equipment Recommendations  None recommended by OT(Pt has appropriate DME)    Recommendations for Other Services       Precautions / Restrictions Precautions Precautions: Fall Precaution Comments: chronic compression fractures in back Restrictions Weight Bearing Restrictions: No      Mobility Bed Mobility Overal bed mobility: Needs Assistance Bed Mobility: Supine to Sit     Supine to sit: Min guard;HOB elevated     General bed mobility comments: no physical assist needed  Transfers Overall transfer level: Needs assistance Equipment used: Rolling walker (2 wheeled) Transfers: Sit to/from [employer] Sit to Stand: Min guard;From elevated surface Stand pivot transfers: Min guard       General transfer comment: min guard for safety    Balance Overall balance assessment: Mild deficits observed, not formally tested                                         ADL either performed or assessed with clinical judgement   ADL Overall ADL's : Needs assistance/impaired Eating/Feeding: Sitting;Independent   Grooming: Min guard;Standing Grooming Details  (indicate cue type and reason): decreased activity tolerance for standing grooming tasks at this time Upper Body Bathing: Set up;Sitting   Lower Body Bathing: Moderate assistance;With adaptive equipment;Sitting/lateral leans Lower Body Bathing Details (indicate cue type and reason): educated on long handle sponge Upper Body Dressing : Set up;Sitting Upper Body Dressing Details (indicate cue type and reason): extra gown as robe Lower Body Dressing: Moderate assistance;Sit to/from stand Lower Body Dressing Details (indicate cue type and reason): able to doff a sock, HARD to get back on - increased SOB with exertion of energy/effort Toilet Transfer: Min Statistician Details (indicate cue type and reason): simulated through recliner transfer [place] and Hygiene: Total assistance;Bed level Toileting - Clothing Manipulation Details (indicate cue type and reason): for rear peri care     Functional mobility during ADLs: Min guard;Rolling walker General ADL Comments: decreased activity tolerance, decreased ability to access her LB     Vision Patient Visual Report: No change from baseline       Perception     Praxis      Pertinent Vitals/Pain Pain Assessment: No/denies pain     Hand Dominance Right   Extremity/Trunk Assessment Upper Extremity Assessment Upper Extremity Assessment: Overall WFL for tasks assessed   Lower Extremity Assessment Lower Extremity Assessment: Defer to PT evaluation   Cervical / Trunk Assessment Cervical / Trunk Assessment: Other exceptions Cervical / Trunk Exceptions: hx of compression fractures and morbid obesity   Communication Communication Communication: No difficulties  Cognition Arousal/Alertness: Awake/alert Behavior During Therapy: WFL for tasks assessed/performed Overall Cognitive Status: Within Functional Limits for tasks assessed                                     General  Comments       Exercises     Shoulder Instructions      Home Living Family/patient expects to be discharged to:: Private residence Living Arrangements: Spouse/significant other;Other relatives(sister, brother in law, mother, multiple pets) Available Help at Discharge: Family;Available 24 hours/day Type of Home: House Home Access: Stairs to enter [employer] of Steps: 1 Entrance Stairs-Rails: None Home Layout: One level     Bathroom Shower/Tub: Teacher, early years/pre: Handicapped height     Home Equipment: Hospital bed;Grab bars - toilet;Grab bars - tub/shower;Hand held shower head;Shower seat;Bedside commode;Walker - 2 wheels;Wheelchair - Scientist, physiological: Reacher;Long-handled English as a second language teacher Comments: 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      Prior Functioning/Environment Level of Independence: Independent with assistive device(s)        Comments: uses RW and DME at baseline, does not drive (sister or husband does)        OT Problem List: Decreased strength;Decreased activity tolerance;Impaired balance (sitting and/or standing);Cardiopulmonary status limiting activity;Obesity;Increased edema      OT Treatment/Interventions: Self-care/ADL training;Energy conservation;DME and/or AE instruction;Therapeutic activities;Patient/family education;Balance training    OT Goals(Current goals can be found in the care plan section) Acute Rehab OT Goals Patient Stated Goal: "get back to where I can help my family" OT Goal Formulation: With patient Time For Goal Achievement: 06/21/18 Potential to Achieve Goals: Good ADL Goals Pt Will Perform Grooming: with modified independence;standing Pt Will Perform Upper Body Dressing: with modified independence;sitting Pt Will Perform Lower Body Dressing: with supervision;sit to/from stand Pt Will Transfer to Toilet: with modified independence;ambulating Pt Will Perform  Toileting - Clothing Manipulation and hygiene: with modified independence;sit to/from stand Additional ADL Goal #1: Pt will recall 3 ways of conserving energy during ADL at indpendent level  OT Frequency: Min 2X/week   Barriers to D/C:            Co-evaluation PT/OT/SLP Co-Evaluation/Treatment: Yes Reason for Co-Treatment: For patient/therapist safety;To address functional/ADL transfers PT goals addressed during session: Mobility/safety with mobility;Balance;Proper use of DME OT goals addressed during session: ADL's and self-care;Strengthening/ROM      AM-PAC PT "6 Clicks" Daily Activity     Outcome Measure Help from another person eating meals?: None Help from another person taking care of personal grooming?: A Little Help from another person toileting, which includes using toliet, bedpan, or urinal?: A Lot Help from another person bathing (including washing, rinsing, drying)?: A Little Help from another person to put on and taking off regular upper body clothing?: A Little Help from another person to put on and taking off regular lower body clothing?: A Lot 6 Click Score: 17   End of Session Equipment Utilized During Treatment: Rolling walker;Gait belt;Oxygen Nurse Communication: Mobility status(able to use BSC)  Activity Tolerance: Patient tolerated treatment well Patient left: in chair;with call bell/phone within reach  OT Visit Diagnosis: Unsteadiness on feet (R26.81);Muscle weakness (generalized) (M62.81)                Time: 0865-7846 OT Time Calculation (min): 24 min Charges:  OT General Charges $OT Visit: 1 Visit OT Evaluation $OT Eval Moderate Complexity: 1 Mod  Hulda Humphrey OTR/L Acute Rehabilitation Services Pager: [phone] Office: [place] 06/07/2018, 12:31 PM

## 2018-06-08 ENCOUNTER — Inpatient Hospital Stay (HOSPITAL_COMMUNITY)

## 2018-06-08 ENCOUNTER — Inpatient Hospital Stay

## 2018-06-08 DIAGNOSIS — R0602 Shortness of breath: Secondary | ICD-10-CM

## 2018-06-08 LAB — COMPREHENSIVE METABOLIC PANEL
ALBUMIN: 3.4 g/dL — AB (ref 3.5–5.0)
ALT: 15 U/L (ref 0–44)
ANION GAP: 8 (ref 5–15)
AST: 19 U/L (ref 15–41)
Alkaline Phosphatase: 91 U/L (ref 38–126)
BILIRUBIN TOTAL: 0.5 mg/dL (ref 0.3–1.2)
BUN: 17 mg/dL (ref 8–23)
CO2: 23 mmol/L (ref 22–32)
Calcium: 8.9 mg/dL (ref 8.9–10.3)
Chloride: 105 mmol/L (ref 98–111)
Creatinine, Ser: 1.23 mg/dL — ABNORMAL HIGH (ref 0.44–1.00)
GFR, EST AFRICAN AMERICAN: 53 mL/min — AB (ref >60)
GFR, EST NON AFRICAN AMERICAN: 46 mL/min — AB (ref >60)
Glucose, Bld: 153 mg/dL — ABNORMAL HIGH (ref 70–99)
POTASSIUM: 4.9 mmol/L (ref 3.5–5.1)
Sodium: 136 mmol/L (ref 135–145)
TOTAL PROTEIN: 7.1 g/dL (ref 6.5–8.1)

## 2018-06-08 LAB — CBC WITH DIFFERENTIAL/PLATELET
BAND NEUTROPHILS: 0 %
Basophils Relative: 0 %
Blasts: 0 %
Eosinophils Relative: 0 %
HEMATOCRIT: 36.5 % (ref 36.0–46.0)
Hemoglobin: 10.8 g/dL — ABNORMAL LOW (ref 12.0–15.0)
LYMPHS PCT: 6 %
MCH: 29.3 pg (ref 26.0–34.0)
MCHC: 29.6 g/dL — ABNORMAL LOW (ref 30.0–36.0)
MCV: 98.9 fL (ref 80.0–100.0)
Metamyelocytes Relative: 0 %
Monocytes Relative: 3 %
Myelocytes: 0 %
NEUTROS PCT: 91 %
OTHER: 0 %
Platelets: 336 10*3/uL (ref 150–400)
Promyelocytes Relative: 0 %
RBC: 3.69 MIL/uL — ABNORMAL LOW (ref 3.87–5.11)
RDW: 14.4 % (ref 11.5–15.5)
WBC: 16.4 10*3/uL — AB (ref 4.0–10.5)
nRBC: 0 % (ref 0.0–0.2)
nRBC: 0 /100 WBC

## 2018-06-08 LAB — GLUCOSE, CAPILLARY
Glucose-Capillary: 137 mg/dL — ABNORMAL HIGH (ref 70–99)
Glucose-Capillary: 116 mg/dL — ABNORMAL HIGH (ref 70–99)
Glucose-Capillary: 128 mg/dL — ABNORMAL HIGH (ref 70–99)
Glucose-Capillary: 92 mg/dL (ref 70–99)

## 2018-06-08 LAB — LACTIC ACID, PLASMA: Lactic Acid, Venous: 1 mmol/L (ref 0.5–1.9)

## 2018-06-08 LAB — MAGNESIUM: MAGNESIUM: 2.2 mg/dL (ref 1.7–2.4)

## 2018-06-08 LAB — ECHOCARDIOGRAM COMPLETE
Height: 68 in
Weight: 4184 oz

## 2018-06-08 LAB — PROTIME-INR
INR: 1.04
PROTHROMBIN TIME: 13.5 s (ref 11.4–15.2)

## 2018-06-08 IMAGING — CT CT T SPINE W/O CM
3 series · 15 of 33 positions shown, 18 images · non-contrast
Comparison: MRI thoracic and lumbar spine [DATE]

CLINICAL DATA: Back pain.  History of multiple spinal fractures

EXAM:
CT THORACIC AND LUMBAR SPINE WITHOUT CONTRAST
TECHNIQUE: Multidetector CT imaging of the thoracic and lumbar spine was
performed without contrast. Multiplanar CT image reconstructions
were also generated.

[Series 1: l-spine st · axial · 0.37mm/px · z∈[-1160,-984]mm · 7 of 105 slices shown, 9 images]
[im 9/105  soft-tissue]
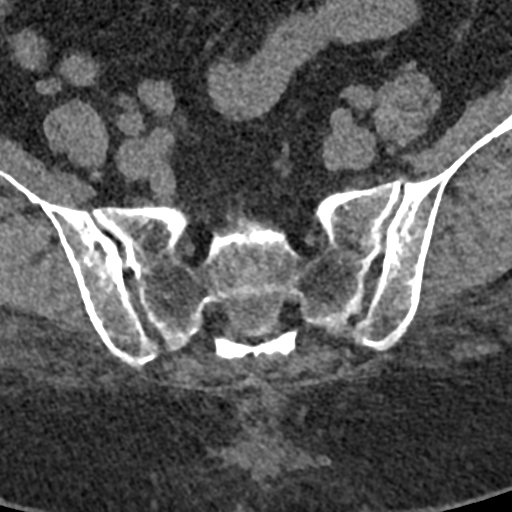
[im 9/105  bone]
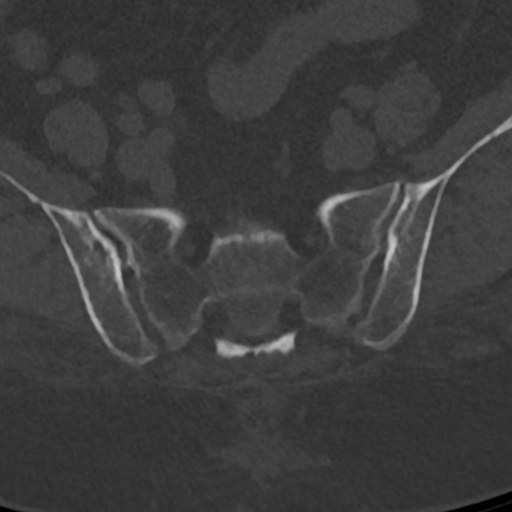
[im 25/105  bone]
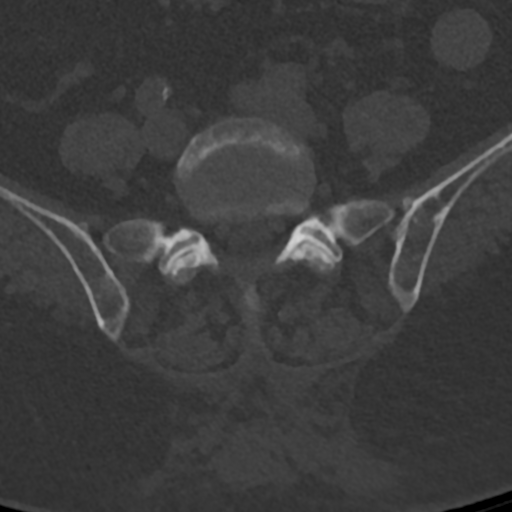
[im 41/105  bone]
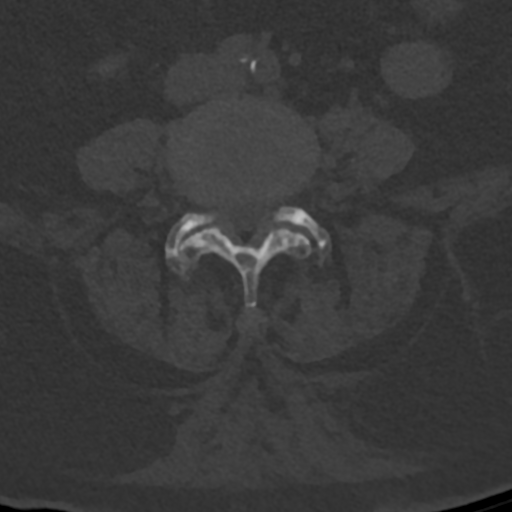
[im 57/105  bone]
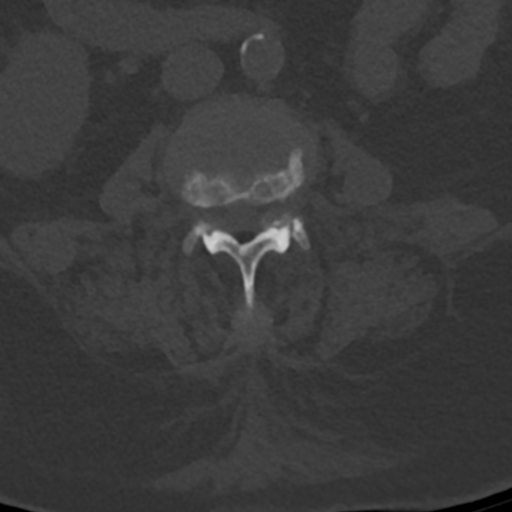
[im 65/105  soft-tissue]
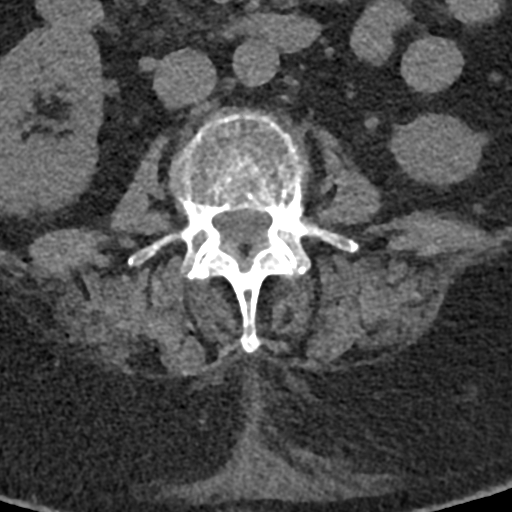
[im 65/105  bone]
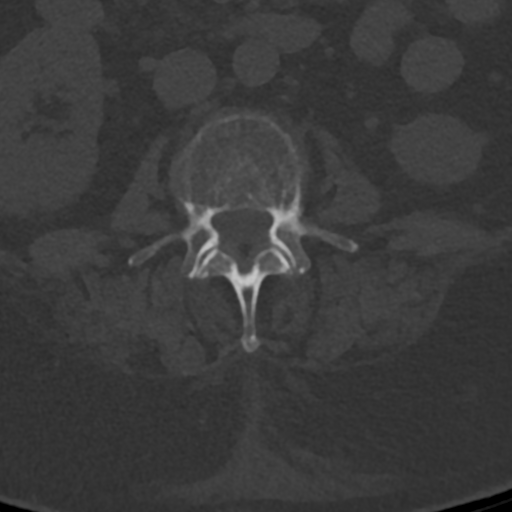
[im 81/105  bone]
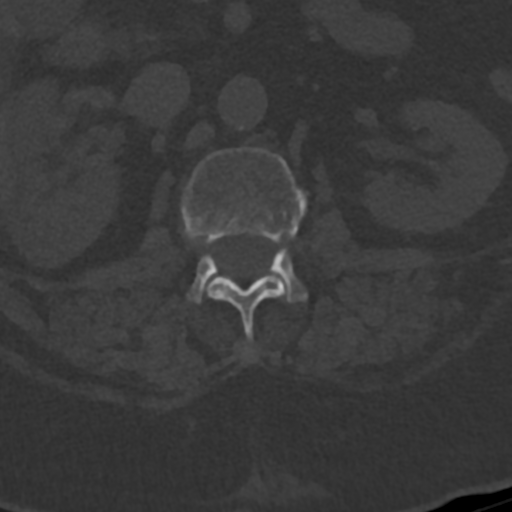
[im 97/105  bone]
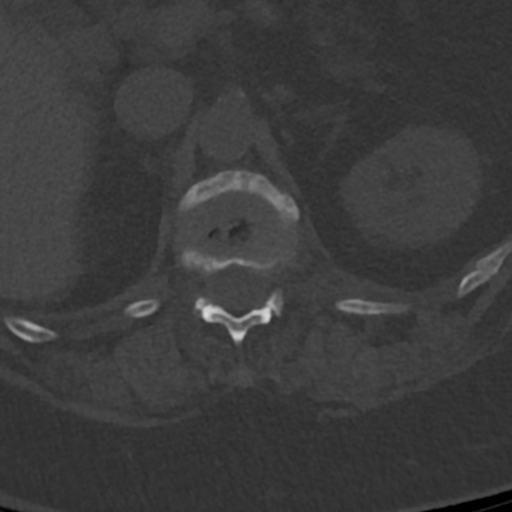

[Series 503: coronal · coronal · 0.41mm/px · 3 of 58 slices shown]
[im 12/58  bone]
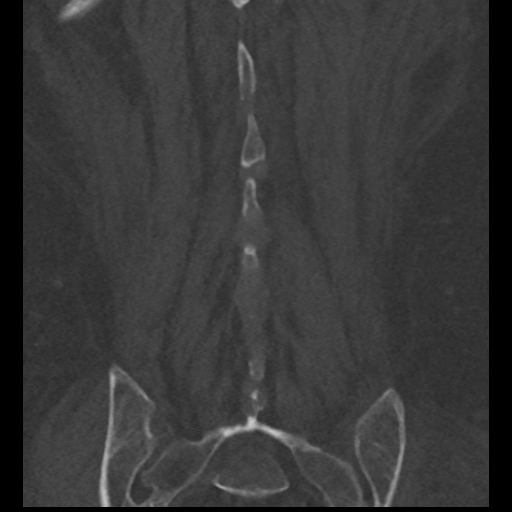
[im 23/58  bone]
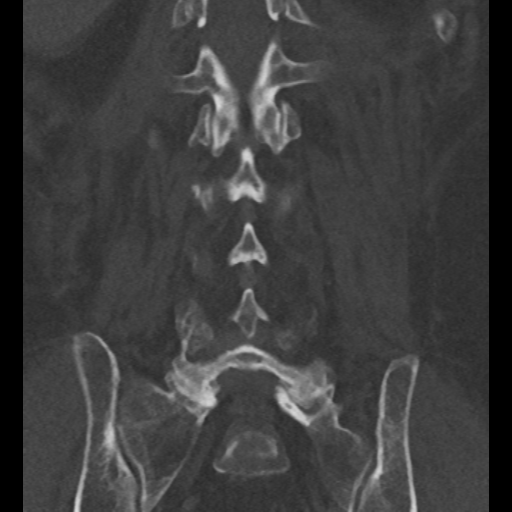
[im 35/58  bone]
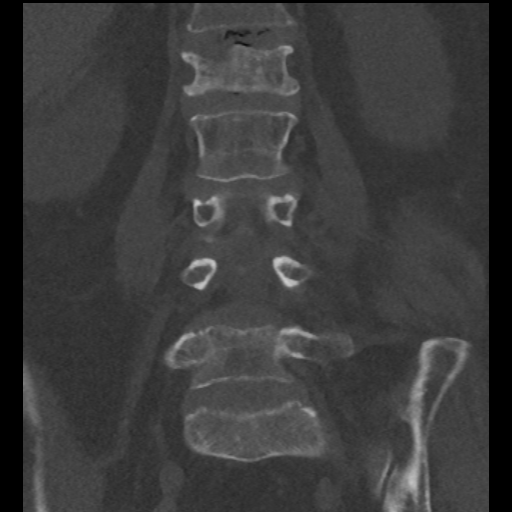

[Series 504: sagittal · sagittal · 0.41mm/px · 5 of 63 slices shown, 6 images]
[im 21/63  bone]
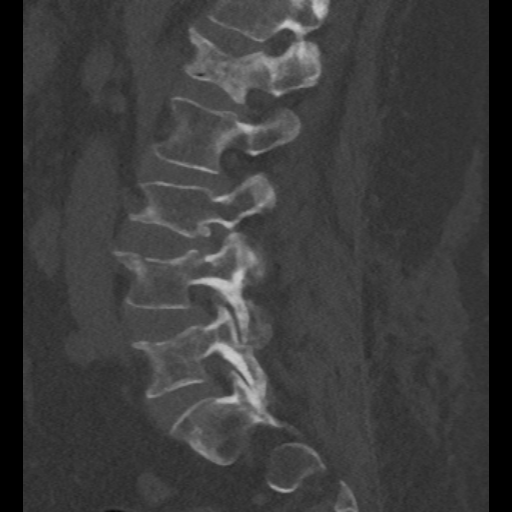
[im 26/63  bone]
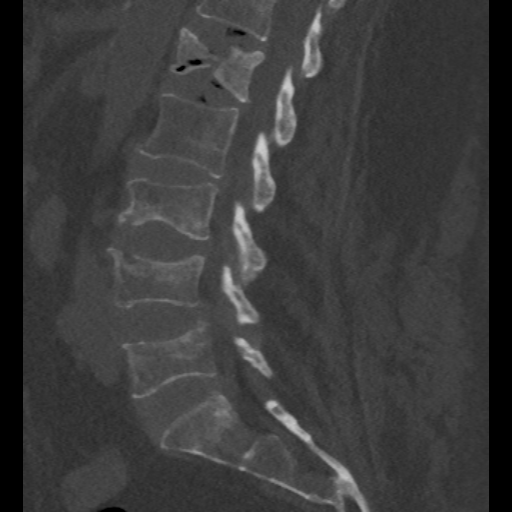
[im 32/63  soft-tissue]
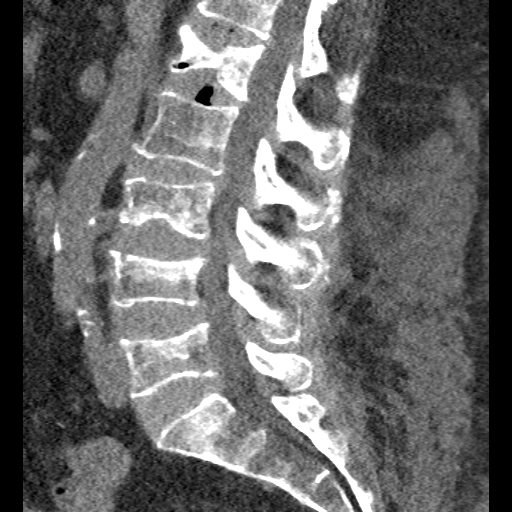
[im 32/63  bone]
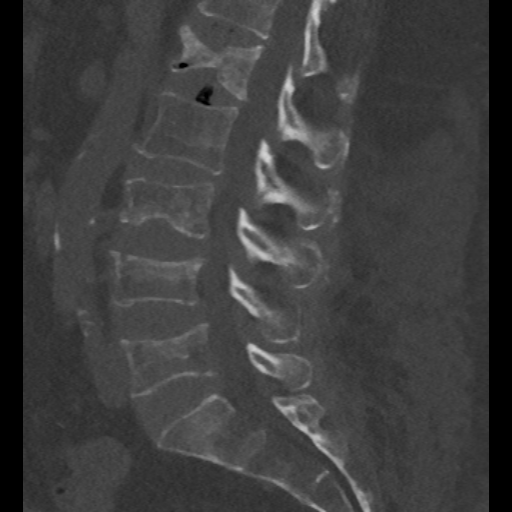
[im 37/63  bone]
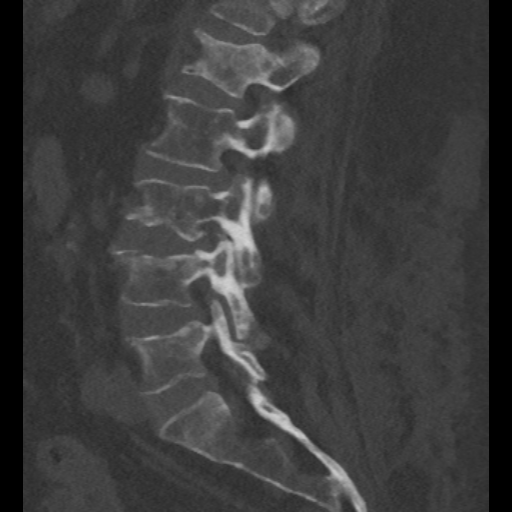
[im 42/63  bone]
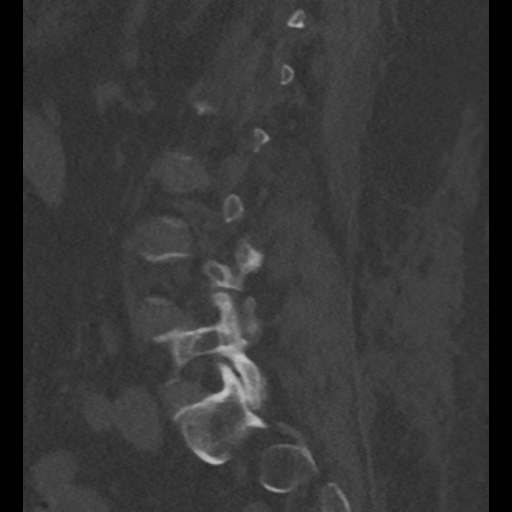

[15 of 33 positions shown; findings below may reference images not displayed]

FINDINGS: CT THORACIC SPINE FINDINGS

Alignment: Mild anterolisthesis T9-10 unchanged from the prior
study. Mild retrolisthesis T5-6. Mild thoracic kyphosis

Vertebrae: Multiple thoracic compression fractures unchanged from
the prior MRI. No acute fracture.

Mild compression fracture superior endplate of T3 is chronic

Mild compression fracture T5 is chronic and unchanged

Mild compression fracture T6 is chronic and unchanged

Chronic compression or T7 with vertebroplasty unchanged

Mild compression fracture T8 with vertebroplasty unchanged

Mild compression fracture inferior endplate of T9 is unchanged.

Severe compression fracture T10 with cement vertebral augmentation
unchanged from the prior study.

Paraspinal and other soft tissues: Cardiac enlargement. Left lower
lobe atelectasis and small effusion. Small right effusion

Disc levels: Multilevel mild disc degeneration throughout the
thoracic spine. Disc and facet degeneration and mild anterolisthesis
T9-10 without significant stenosis. Disc and facet degeneration at
T11-12 and T12-L1 without significant stenosis.

CT LUMBAR SPINE FINDINGS

Segmentation: Normal

Alignment: Mild anterolisthesis L4-5

Vertebrae: Severe compression fracture of L1 is unchanged from the
prior MRI. No retropulsion of bone into the canal. Negative for
stenosis. No posterior element fracture.

Inferior endplate fracture of L3 is unchanged from the prior study

Moderate compression fracture L4 unchanged from the prior study

Mild compression fracture of L5 has progressed since the prior study
compatible with recent fracture.

Paraspinal and other soft tissues: Mild atherosclerotic aorta.
Negative for paraspinous mass or fluid collection.

Disc levels: L1-2: Diffuse disc bulging without stenosis

L2-3: Moderate disc bulging. Facet degeneration and mild spinal
stenosis

L3-4: Moderate disc bulging and facet hypertrophy. Moderate spinal
stenosis. Moderate subarticular stenosis bilaterally

L4-5: Mild anterolisthesis. Diffuse disc bulging with severe facet
degeneration. Moderate spinal stenosis.

L5-S1: Mild disc and facet degeneration without stenosis
IMPRESSION: 1. Multiple thoracic compression fractures unchanged from the MRI of
[DATE].
[DATE]. Multiple lumbar compression fractures which are unchanged from
the prior MRI. Further fracture of the L5 vertebral body since the
prior MRI compatible with recent fracture.
3. Multilevel degenerative change in the lumbar spine with moderate
spinal stenosis at L3-4 L4-5.

## 2018-06-08 IMAGING — CT CT T SPINE W/O CM
3 series · 15 of 33 positions shown, 18 images · non-contrast
Comparison: MRI thoracic and lumbar spine [DATE]

CLINICAL DATA: Back pain.  History of multiple spinal fractures

EXAM:
CT THORACIC AND LUMBAR SPINE WITHOUT CONTRAST
TECHNIQUE: Multidetector CT imaging of the thoracic and lumbar spine was
performed without contrast. Multiplanar CT image reconstructions
were also generated.

[Series 4: t-spine 2.0 st · axial · 0.39mm/px · z∈[-993,-747]mm · 7 of 147 slices shown, 9 images]
[im 12/147  soft-tissue]
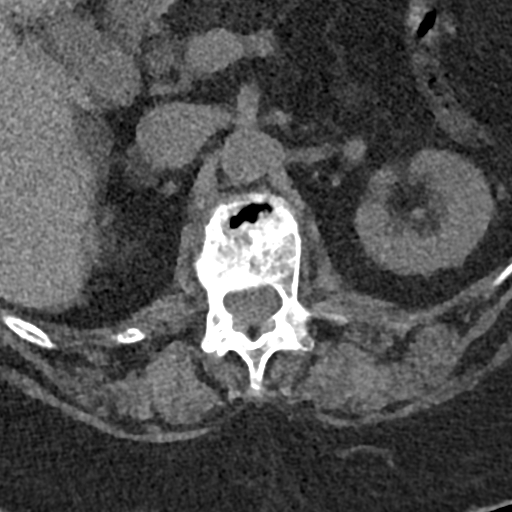
[im 12/147  bone]
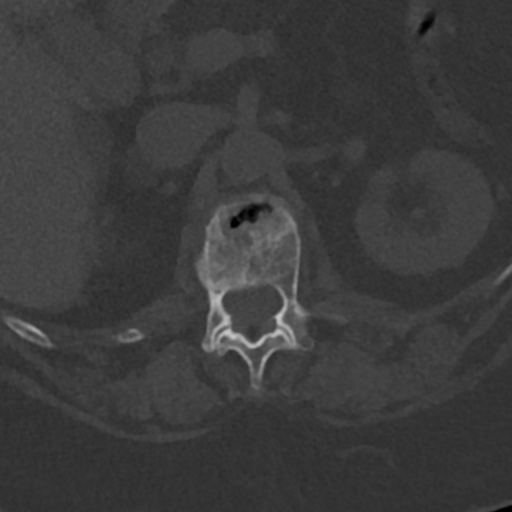
[im 34/147  bone]
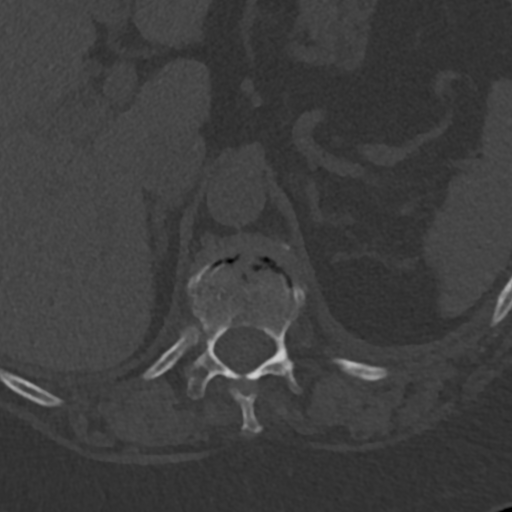
[im 57/147  bone]
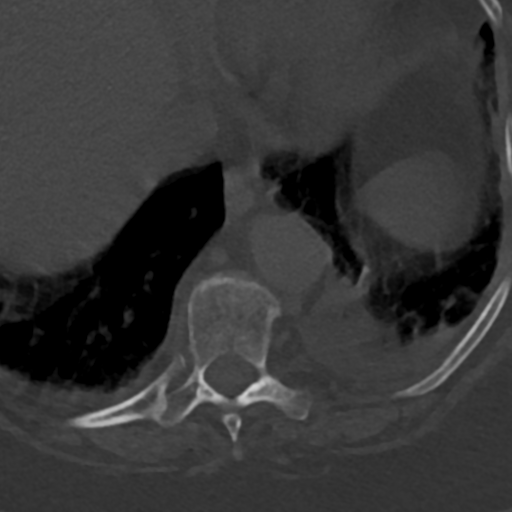
[im 79/147  bone]
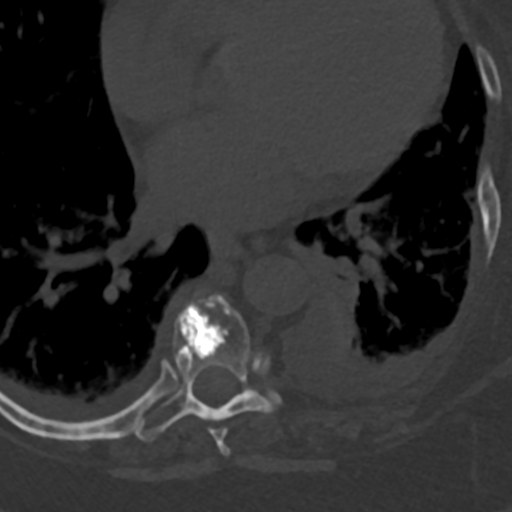
[im 90/147  soft-tissue]
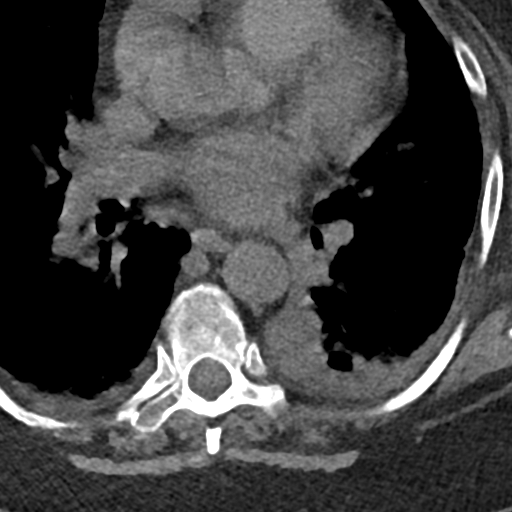
[im 90/147  bone]
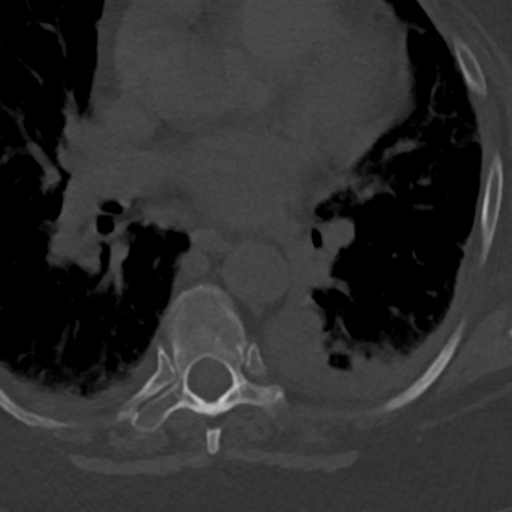
[im 113/147  bone]
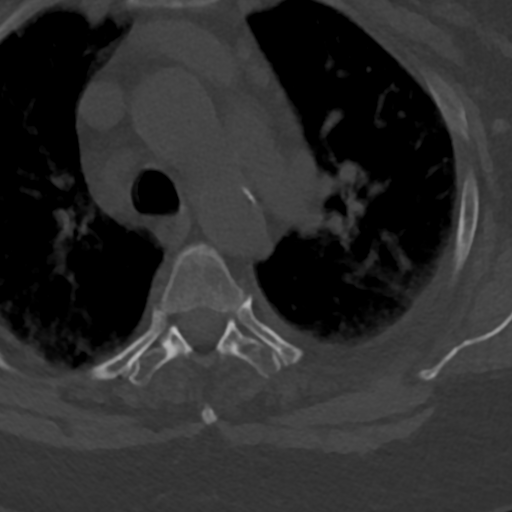
[im 135/147  bone]
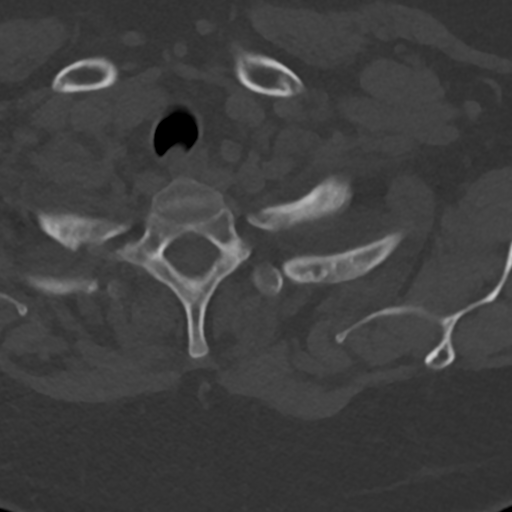

[Series 602: coronal · coronal · 0.57mm/px · 3 of 64 slices shown]
[im 13/64  bone]
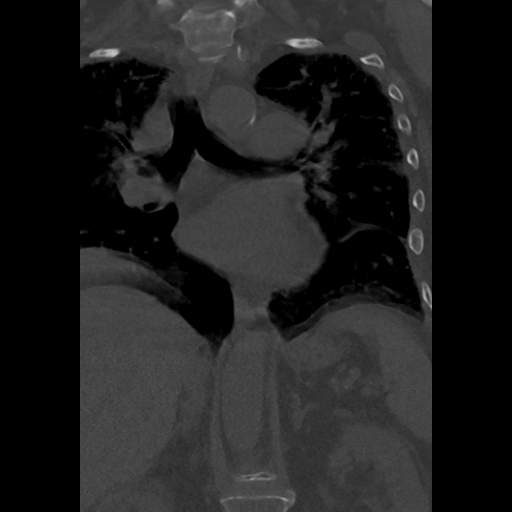
[im 26/64  bone]
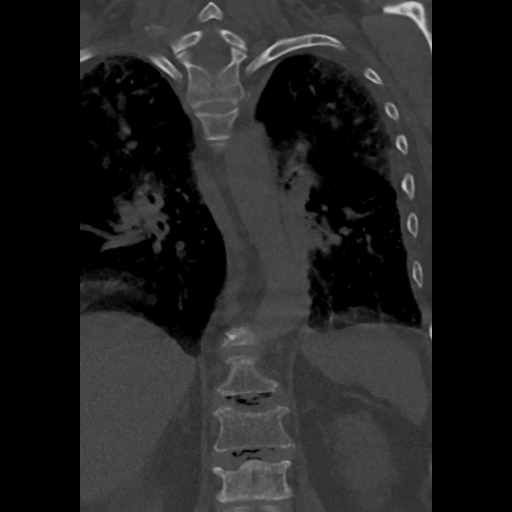
[im 38/64  bone]
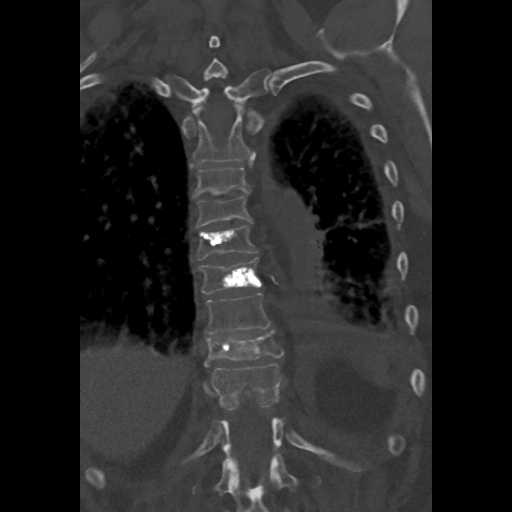

[Series 603: sagittal · sagittal · 0.57mm/px · 5 of 58 slices shown, 6 images]
[im 20/58  bone]
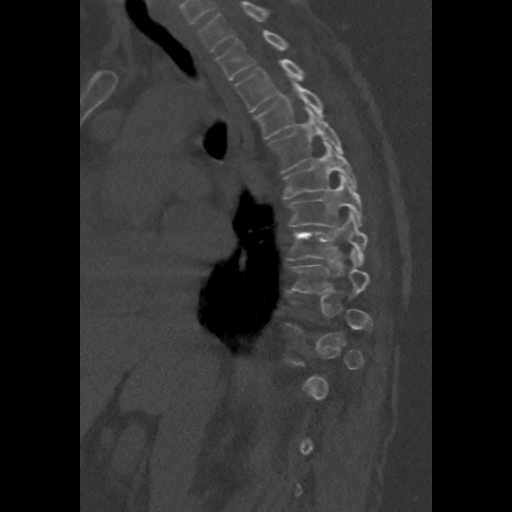
[im 24/58  bone]
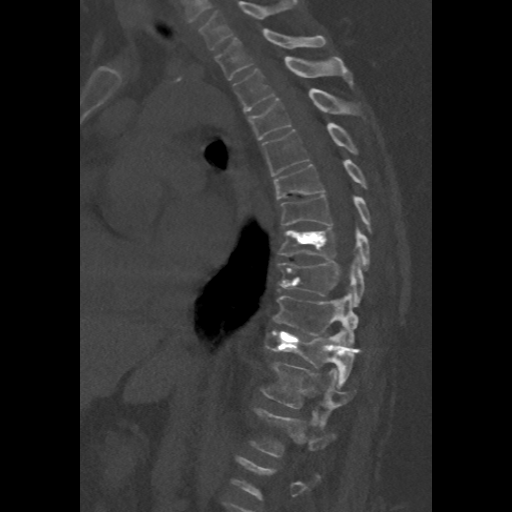
[im 29/58  soft-tissue]
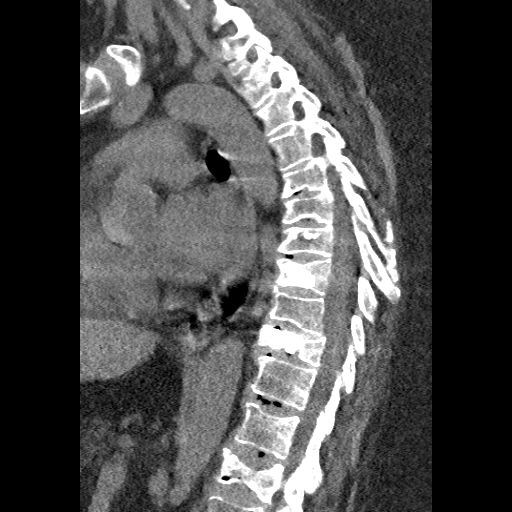
[im 29/58  bone]
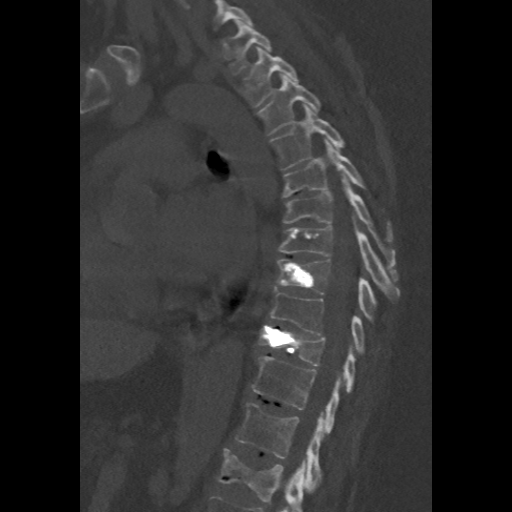
[im 34/58  bone]
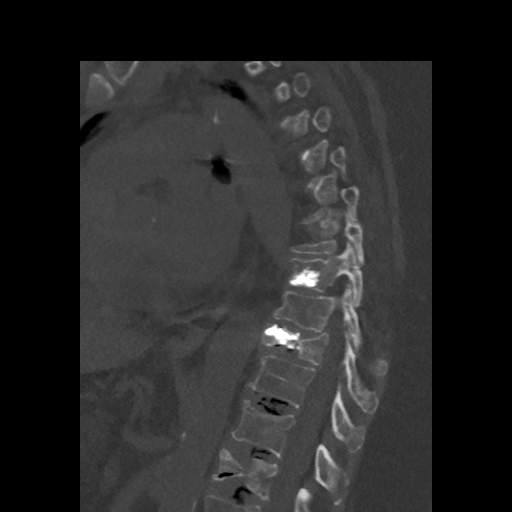
[im 39/58  bone]
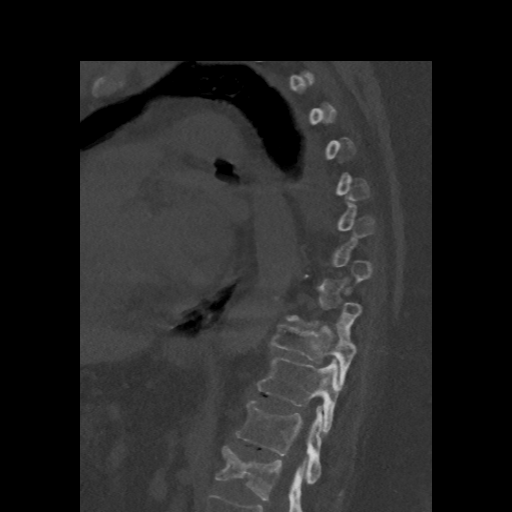

[15 of 33 positions shown; findings below may reference images not displayed]

FINDINGS: CT THORACIC SPINE FINDINGS

Alignment: Mild anterolisthesis T9-10 unchanged from the prior
study. Mild retrolisthesis T5-6. Mild thoracic kyphosis

Vertebrae: Multiple thoracic compression fractures unchanged from
the prior MRI. No acute fracture.

Mild compression fracture superior endplate of T3 is chronic

Mild compression fracture T5 is chronic and unchanged

Mild compression fracture T6 is chronic and unchanged

Chronic compression or T7 with vertebroplasty unchanged

Mild compression fracture T8 with vertebroplasty unchanged

Mild compression fracture inferior endplate of T9 is unchanged.

Severe compression fracture T10 with cement vertebral augmentation
unchanged from the prior study.

Paraspinal and other soft tissues: Cardiac enlargement. Left lower
lobe atelectasis and small effusion. Small right effusion

Disc levels: Multilevel mild disc degeneration throughout the
thoracic spine. Disc and facet degeneration and mild anterolisthesis
T9-10 without significant stenosis. Disc and facet degeneration at
T11-12 and T12-L1 without significant stenosis.

CT LUMBAR SPINE FINDINGS

Segmentation: Normal

Alignment: Mild anterolisthesis L4-5

Vertebrae: Severe compression fracture of L1 is unchanged from the
prior MRI. No retropulsion of bone into the canal. Negative for
stenosis. No posterior element fracture.

Inferior endplate fracture of L3 is unchanged from the prior study

Moderate compression fracture L4 unchanged from the prior study

Mild compression fracture of L5 has progressed since the prior study
compatible with recent fracture.

Paraspinal and other soft tissues: Mild atherosclerotic aorta.
Negative for paraspinous mass or fluid collection.

Disc levels: L1-2: Diffuse disc bulging without stenosis

L2-3: Moderate disc bulging. Facet degeneration and mild spinal
stenosis

L3-4: Moderate disc bulging and facet hypertrophy. Moderate spinal
stenosis. Moderate subarticular stenosis bilaterally

L4-5: Mild anterolisthesis. Diffuse disc bulging with severe facet
degeneration. Moderate spinal stenosis.

L5-S1: Mild disc and facet degeneration without stenosis
IMPRESSION: 1. Multiple thoracic compression fractures unchanged from the MRI of
[DATE].
[DATE]. Multiple lumbar compression fractures which are unchanged from
the prior MRI. Further fracture of the L5 vertebral body since the
prior MRI compatible with recent fracture.
3. Multilevel degenerative change in the lumbar spine with moderate
spinal stenosis at L3-4 L4-5.

## 2018-06-08 MED ORDER — PERFLUTREN LIPID MICROSPHERE
1.0000 mL | INTRAVENOUS | Status: AC | PRN
  Administered 2018-06-08: 3 mL via INTRAVENOUS
  Filled 2018-06-08: qty 10

## 2018-06-08 MED ORDER — DOXYCYCLINE HYCLATE 100 MG PO TABS
100.0000 mg | ORAL_TABLET | Freq: Two times a day (BID) | ORAL | Status: DC
  Administered 2018-06-08 – 2018-06-12 (×9): 100 mg via ORAL
  Filled 2018-06-08 (×9): qty 1

## 2018-06-08 NOTE — Progress Notes (Signed)
[facility] Progress Note  Patient: Theresa Fernandez OAC:[id number]   PCP: Theodis Blaze, MD DOB: 21-Dec-1955   DOA: 06/06/2018   DOS: 06/08/2018   Date of Service: the patient was seen and examined on 06/08/2018  Brief hospital course: Theresa Fernandez is a 62 y.o. female with medical history significant for COPD, hypertension, depression, anxiety day with who presented to the ED this afternoon with complaints of shortness of breath, fatigue, wheezing and cough.  She states she was in her usual state of health until she woke up this morning at 2 AM feeling like she could not catch her breath.  She felt acute anxiety during this episode like she was not going to be able to continue to breathe.  She has had no fever.  She has been coughing with some production of yellow sputum which is new today.  She is used her nebulizer 4 times since 2:00 this morning with minimal relief.  She has had increased bilateral lower extremity edema and increased orthopnea.  She sleeps in a hospital bed with the head elevated.  She states she has gained about 20 pounds in the past 6 to 8 months that she attributes to being less mobile due to compression fractures in her spine.  She does say that she has a history of CHF and had to come into the hospital and get Lasix in the past but we do not have any echocardiograms in our system.  She states that this is how she felt when she was admitted for heart failure in the past.  She chest pain, lightheadedness, palpitations.  She quit smoking a week ago.  She takes Lasix as needed for lower extremity swelling but has not taken this in a while. Currently further plan is continue BiPAP support and monitor blood pressure.  Subjective: Feeling ill with significant shortness of breath and cough.  No nausea no vomiting.  Oral intake is improving.  Assessment and Plan: Acute on chronic exacerbation of COPD. Acute on chronic heart failure with preserved EF. gained 20 pounds in  several months, has increased orthopnea, what sounds like PND episode early morning, increased lower extremity edema.   Her BNP can be falsely low due to her obesity.  doing well on BiPAP and ABG is reassuring. Received IV Lasix.  Currently on hold due to worsening renal function and hypotension yesterday. Echocardiogram showed preserved EF.  No significant valvular abnormality other than moderate MR. -Continue supplemental oxygen to keep sats above 92% -Continue BiPAP for now nightly -Scheduled nebs every 4 hours with PRN as well -Continue steroids, antibiotics and nebulization  Cellulitis, lower abdomen -On doxycycline.  Hypertension: -Continue lisinopril 2.5 mg daily, consider increasing to 5 mg based on blood pressure trend  Diabetes type 2, non-insulin-dependent -Hold Actos -Carb controlled diet Continue sliding scale insulin  Spinal compression fractures -Continue Robaxin, Soma, Lyrica -f/u as outpatient  Hyperlipidemia: Continue statin  Depression/anxiety: -Continue Zoloft, Lamictal, BuSpar,  -cont Xanax as needed  Diet: cardiac diet DVT Prophylaxis: subcutaneous Heparin  Advance goals of care discussion: full code  Family Communication: no family was present at bedside, at the time of interview.   Disposition:  Discharge to home.  Consultants: none Procedures: noen  Scheduled Meds: . albuterol  2.5 mg Nebulization Q4H  . amphetamine-dextroamphetamine  20 mg Oral TID  . carisoprodol  350 mg Oral QHS  . doxycycline  100 mg Oral Q12H  . enoxaparin (LOVENOX) injection  40 mg Subcutaneous Q24H  . famotidine  20  mg Oral BID  . insulin aspart  0-20 Units Subcutaneous TID WC  . insulin aspart  0-5 Units Subcutaneous QHS  . lamoTRIgine  25 mg Oral Daily  . magnesium gluconate  500 mg Oral BID  . methylPREDNISolone (SOLU-MEDROL) injection  60 mg Intravenous Q12H  . pravastatin  20 mg Oral q1800  . pregabalin  100 mg Oral BID  . sertraline  100 mg Oral BID   . sodium chloride flush  3 mL Intravenous Q12H   Continuous Infusions: . sodium chloride Stopped (06/06/18 2052)  . sodium chloride     PRN Meds: sodium chloride, sodium chloride, acetaminophen, ALPRAZolam, diphenhydrAMINE, ipratropium-albuterol, ondansetron (ZOFRAN) IV, oxyCODONE-acetaminophen **AND** oxyCODONE, sodium chloride flush Antibiotics: Anti-infectives (From admission, onward)   Start     Dose/Rate Route Frequency Ordered Stop   06/08/18 1000  doxycycline (VIBRA-TABS) tablet 100 mg     100 mg Oral Every 12 hours 06/08/18 0722     06/07/18 1800  doxycycline (VIBRAMYCIN) 100 mg in sodium chloride 0.9 % 250 mL IVPB  Status:  Discontinued     100 mg 125 mL/hr over 120 Minutes Intravenous Every 12 hours 06/07/18 1346 06/08/18 0722   06/07/18 1200  cefTRIAXone (ROCEPHIN) 1 g in sodium chloride 0.9 % 100 mL IVPB  Status:  Discontinued     1 g 200 mL/hr over 30 Minutes Intravenous Every 24 hours 06/06/18 1813 06/07/18 1343   06/06/18 2200  doxycycline (VIBRA-TABS) tablet 100 mg  Status:  Discontinued     100 mg Oral Every 12 hours 06/06/18 1733 06/06/18 1810   06/06/18 2000  azithromycin (ZITHROMAX) 500 mg in sodium chloride 0.9 % 250 mL IVPB  Status:  Discontinued     500 mg 250 mL/hr over 60 Minutes Intravenous Every 24 hours 06/06/18 1813 06/08/18 0722   06/06/18 1630  doxycycline (VIBRAMYCIN) 100 mg in sodium chloride 0.9 % 250 mL IVPB  Status:  Discontinued     100 mg 125 mL/hr over 120 Minutes Intravenous  Once 06/06/18 1551 06/06/18 1900   06/06/18 1530  cefTRIAXone (ROCEPHIN) 2 g in sodium chloride 0.9 % 100 mL IVPB     2 g 200 mL/hr over 30 Minutes Intravenous  Once 06/06/18 1515 06/06/18 1607       Objective: Physical Exam: Vitals:   06/08/18 0801 06/08/18 1246 06/08/18 1615 06/08/18 1706  BP: 104/61   126/72  Pulse: 100   (!) 109  Resp: (!) 24     Temp: 98.1 F (36.7 C)   98.7 F (37.1 C)  TempSrc: Oral   Oral  SpO2: 95% 98% 96% 94%  Weight:      Height:         Intake/Output Summary (Last 24 hours) at 06/08/2018 1902 Last data filed at 06/08/2018 1700 Gross per 24 hour  Intake 2424.8 ml  Output 3000 ml  Net -575.2 ml   Filed Weights   06/06/18 1456 06/08/18 0422  Weight: 113.4 kg 118.6 kg   General: Alert, Awake and Oriented to Time, Place and Person. Appear in moderate distress, affect appropriate Eyes: PERRL, Conjunctiva normal ENT: Oral Mucosa clear moist. Neck: no JVD, no Abnormal Mass Or lumps Cardiovascular: S1 and S2 Present, no Murmur, Peripheral Pulses Present Respiratory: normal respiratory effort, Bilateral Air entry equal and Decreased, no use of accessory muscle, Clear to Auscultation, no Crackles, Occasional  wheezes Abdomen: Bowel Sound present, Soft and no tenderness, n hernia Skin: ono redness, no Rash, no induration Extremities: no Pedal edema, no  calf tenderness Neurologic: Grossly no focal neuro deficit. Bilaterally Equal motor strength  Data Reviewed: CBC: Recent Labs  Lab 06/06/18 1458 06/06/18 1508 06/07/18 0511 06/08/18 0431  WBC 9.5  --  12.5* 16.4*  NEUTROABS 6.6  --   --   --   HGB 11.9* 12.9 10.6* 10.8*  HCT 39.0 38.0 36.2 36.5  MCV 98.0  --  98.9 98.9  PLT 315  --  335 629   Basic Metabolic Panel: Recent Labs  Lab 06/06/18 1458 06/06/18 1508 06/07/18 0511 06/08/18 0431  NA 135 135 138 136  K 4.5 4.5 4.0 4.9  CL 102 103 104 105  CO2 26  --  22 23  GLUCOSE 85 86 115* 153*  BUN 12 15 18 17   CREATININE 1.09* 1.10* 1.36* 1.23*  CALCIUM 9.3  --  9.0 8.9  MG  --   --   --  2.2    Liver Function Tests: Recent Labs  Lab 06/06/18 1458 06/08/18 0431  AST 21 19  ALT 14 15  ALKPHOS 103 91  BILITOT 0.8 0.5  PROT 7.4 7.1  ALBUMIN 3.7 3.4*   No results for input(s): LIPASE, AMYLASE in the last 168 hours. No results for input(s): AMMONIA in the last 168 hours. Coagulation Profile: Recent Labs  Lab 06/08/18 0431  INR 1.04   Cardiac Enzymes: Recent Labs  Lab 06/06/18 1458    TROPONINI <0.03   BNP (last 3 results) No results for input(s): PROBNP in the last 8760 hours. CBG: Recent Labs  Lab 06/07/18 1637 06/07/18 2112 06/08/18 0759 06/08/18 1208 06/08/18 1703  GLUCAP 83 105* 137* 116* 128*   Studies: Ct Thoracic Spine Wo Contrast  Result Date: 06/08/2018 CLINICAL DATA:  Back pain.  History of multiple spinal fractures EXAM: CT THORACIC AND LUMBAR SPINE WITHOUT CONTRAST TECHNIQUE: Multidetector CT imaging of the thoracic and lumbar spine was performed without contrast. Multiplanar CT image reconstructions were also generated. COMPARISON:  MRI thoracic and lumbar spine 05/08/2018 FINDINGS: CT THORACIC SPINE FINDINGS Alignment: Mild anterolisthesis T9-10 unchanged from the prior study. Mild retrolisthesis T5-6. Mild thoracic kyphosis Vertebrae: Multiple thoracic compression fractures unchanged from the prior MRI. No acute fracture. Mild compression fracture superior endplate of T3 is chronic Mild compression fracture T5 is chronic and unchanged Mild compression fracture T6 is chronic and unchanged Chronic compression or T7 with vertebroplasty unchanged Mild compression fracture T8 with vertebroplasty unchanged Mild compression fracture inferior endplate of T9 is unchanged. Severe compression fracture T10 with cement vertebral augmentation unchanged from the prior study. Paraspinal and other soft tissues: Cardiac enlargement. Left lower lobe atelectasis and small effusion. Small right effusion Disc levels: Multilevel mild disc degeneration throughout the thoracic spine. Disc and facet degeneration and mild anterolisthesis T9-10 without significant stenosis. Disc and facet degeneration at T11-12 and T12-L1 without significant stenosis. CT LUMBAR SPINE FINDINGS Segmentation: Normal Alignment: Mild anterolisthesis L4-5 Vertebrae: Severe compression fracture of L1 is unchanged from the prior MRI. No retropulsion of bone into the canal. Negative for stenosis. No posterior  element fracture. Inferior endplate fracture of L3 is unchanged from the prior study Moderate compression fracture L4 unchanged from the prior study Mild compression fracture of L5 has progressed since the prior study compatible with recent fracture. Paraspinal and other soft tissues: Mild atherosclerotic aorta. Negative for paraspinous mass or fluid collection. Disc levels: L1-2: Diffuse disc bulging without stenosis L2-3: Moderate disc bulging. Facet degeneration and mild spinal stenosis L3-4: Moderate disc bulging and facet hypertrophy. Moderate spinal stenosis.  Moderate subarticular stenosis bilaterally L4-5: Mild anterolisthesis. Diffuse disc bulging with severe facet degeneration. Moderate spinal stenosis. L5-S1: Mild disc and facet degeneration without stenosis IMPRESSION: 1. Multiple thoracic compression fractures unchanged from the MRI of 05/08/2018. 2. Multiple lumbar compression fractures which are unchanged from the prior MRI. Further fracture of the L5 vertebral body since the prior MRI compatible with recent fracture. 3. Multilevel degenerative change in the lumbar spine with moderate spinal stenosis at L3-4 L4-5. Electronically Signed   By: Franchot Gallo M.D.   On: 06/08/2018 15:54   Ct Lumbar Spine Wo Contrast  Result Date: 06/08/2018 CLINICAL DATA:  Back pain.  History of multiple spinal fractures EXAM: CT THORACIC AND LUMBAR SPINE WITHOUT CONTRAST TECHNIQUE: Multidetector CT imaging of the thoracic and lumbar spine was performed without contrast. Multiplanar CT image reconstructions were also generated. COMPARISON:  MRI thoracic and lumbar spine 05/08/2018 FINDINGS: CT THORACIC SPINE FINDINGS Alignment: Mild anterolisthesis T9-10 unchanged from the prior study. Mild retrolisthesis T5-6. Mild thoracic kyphosis Vertebrae: Multiple thoracic compression fractures unchanged from the prior MRI. No acute fracture. Mild compression fracture superior endplate of T3 is chronic Mild compression fracture  T5 is chronic and unchanged Mild compression fracture T6 is chronic and unchanged Chronic compression or T7 with vertebroplasty unchanged Mild compression fracture T8 with vertebroplasty unchanged Mild compression fracture inferior endplate of T9 is unchanged. Severe compression fracture T10 with cement vertebral augmentation unchanged from the prior study. Paraspinal and other soft tissues: Cardiac enlargement. Left lower lobe atelectasis and small effusion. Small right effusion Disc levels: Multilevel mild disc degeneration throughout the thoracic spine. Disc and facet degeneration and mild anterolisthesis T9-10 without significant stenosis. Disc and facet degeneration at T11-12 and T12-L1 without significant stenosis. CT LUMBAR SPINE FINDINGS Segmentation: Normal Alignment: Mild anterolisthesis L4-5 Vertebrae: Severe compression fracture of L1 is unchanged from the prior MRI. No retropulsion of bone into the canal. Negative for stenosis. No posterior element fracture. Inferior endplate fracture of L3 is unchanged from the prior study Moderate compression fracture L4 unchanged from the prior study Mild compression fracture of L5 has progressed since the prior study compatible with recent fracture. Paraspinal and other soft tissues: Mild atherosclerotic aorta. Negative for paraspinous mass or fluid collection. Disc levels: L1-2: Diffuse disc bulging without stenosis L2-3: Moderate disc bulging. Facet degeneration and mild spinal stenosis L3-4: Moderate disc bulging and facet hypertrophy. Moderate spinal stenosis. Moderate subarticular stenosis bilaterally L4-5: Mild anterolisthesis. Diffuse disc bulging with severe facet degeneration. Moderate spinal stenosis. L5-S1: Mild disc and facet degeneration without stenosis IMPRESSION: 1. Multiple thoracic compression fractures unchanged from the MRI of 05/08/2018. 2. Multiple lumbar compression fractures which are unchanged from the prior MRI. Further fracture of the L5  vertebral body since the prior MRI compatible with recent fracture. 3. Multilevel degenerative change in the lumbar spine with moderate spinal stenosis at L3-4 L4-5. Electronically Signed   By: Franchot Gallo M.D.   On: 06/08/2018 15:54     Time spent: 35 minutes  Author: Berle Mull, MD [facility] Pager: [phone] 06/08/2018 7:02 PM  Between 7PM-7AM, please contact night-coverage at [email or web address], password [facility]

## 2018-06-08 NOTE — Progress Notes (Signed)
Patient says she is not ready to go to sleep right now and wants to hold off on putting on the bipap. Bipap is on standby at bedside. RT will be back to place on to sleep. RT will continue to monitor.

## 2018-06-08 NOTE — Progress Notes (Signed)
Pt requested to remove Bipap for a little while d/t complaint of headache. Pt placed back on 3.5L/ and will continue to monitor. Jessie Foot, RN

## 2018-06-08 NOTE — Progress Notes (Signed)
Nutrition Brief Note  Received consult from the COPD Protocol.   Patient reports good intake now and PTA. Recent weight gain likely related to fluid overload with hx of CHF.   Body mass index is 39.76 kg/m. Patient meets criteria for obesity, class 2 based on current BMI.   Current diet order is heart healthy CHO modified with 1500 ml fluid restriction, patient is consuming approximately 50-100% of meals at this time. Labs and medications reviewed.   Provided verbal low sodium education and handouts. Patient very appreciative. No further nutrition interventions warranted at this time. If nutrition issues arise, please consult RD.    Molli Barrows, RD, LDN, [place] Pager [phone] After Hours Pager [phone]

## 2018-06-08 NOTE — Progress Notes (Signed)
  Echocardiogram 2D Echocardiogram with definity has been performed.  Theresa Fernandez M 06/08/2018, 8:29 AM

## 2018-06-09 DIAGNOSIS — I5033 Acute on chronic diastolic (congestive) heart failure: Secondary | ICD-10-CM

## 2018-06-09 DIAGNOSIS — E1122 Type 2 diabetes mellitus with diabetic chronic kidney disease: Secondary | ICD-10-CM

## 2018-06-09 DIAGNOSIS — J441 Chronic obstructive pulmonary disease with (acute) exacerbation: Secondary | ICD-10-CM

## 2018-06-09 DIAGNOSIS — N183 Chronic kidney disease, stage 3 unspecified: Secondary | ICD-10-CM

## 2018-06-09 LAB — CBC
HEMATOCRIT: 35 % — AB (ref 36.0–46.0)
Hemoglobin: 10.7 g/dL — ABNORMAL LOW (ref 12.0–15.0)
MCH: 29.7 pg (ref 26.0–34.0)
MCHC: 30.6 g/dL (ref 30.0–36.0)
MCV: 97.2 fL (ref 80.0–100.0)
NRBC: 0 % (ref 0.0–0.2)
PLATELETS: 299 10*3/uL (ref 150–400)
RBC: 3.6 MIL/uL — ABNORMAL LOW (ref 3.87–5.11)
RDW: 14 % (ref 11.5–15.5)
WBC: 15 10*3/uL — AB (ref 4.0–10.5)

## 2018-06-09 LAB — BASIC METABOLIC PANEL
Anion gap: 7 (ref 5–15)
BUN: 17 mg/dL (ref 8–23)
CO2: 26 mmol/L (ref 22–32)
Calcium: 9.1 mg/dL (ref 8.9–10.3)
Chloride: 102 mmol/L (ref 98–111)
Creatinine, Ser: 1.22 mg/dL — ABNORMAL HIGH (ref 0.44–1.00)
GFR calc non Af Amer: 46 mL/min — ABNORMAL LOW (ref >60)
GFR, EST AFRICAN AMERICAN: 53 mL/min — AB (ref >60)
Glucose, Bld: 171 mg/dL — ABNORMAL HIGH (ref 70–99)
Potassium: 4.7 mmol/L (ref 3.5–5.1)
SODIUM: 135 mmol/L (ref 135–145)

## 2018-06-09 LAB — GLUCOSE, CAPILLARY
Glucose-Capillary: 166 mg/dL — ABNORMAL HIGH (ref 70–99)
Glucose-Capillary: 92 mg/dL (ref 70–99)
Glucose-Capillary: 91 mg/dL (ref 70–99)
Glucose-Capillary: 91 mg/dL (ref 70–99)

## 2018-06-09 LAB — MAGNESIUM: Magnesium: 2.2 mg/dL (ref 1.7–2.4)

## 2018-06-09 MED ORDER — IPRATROPIUM-ALBUTEROL 0.5-2.5 (3) MG/3ML IN SOLN
3.0000 mL | Freq: Four times a day (QID) | RESPIRATORY_TRACT | Status: DC
  Administered 2018-06-09 – 2018-06-12 (×13): 3 mL via RESPIRATORY_TRACT
  Filled 2018-06-09 (×13): qty 3

## 2018-06-09 MED ORDER — DM-GUAIFENESIN ER 30-600 MG PO TB12
1.0000 | ORAL_TABLET | Freq: Two times a day (BID) | ORAL | Status: DC
  Administered 2018-06-09 – 2018-06-12 (×7): 1 via ORAL
  Filled 2018-06-09 (×7): qty 1

## 2018-06-09 MED ORDER — ALBUTEROL SULFATE (2.5 MG/3ML) 0.083% IN NEBU: 2.5000 mg | INHALATION_SOLUTION | RESPIRATORY_TRACT | Status: DC | PRN

## 2018-06-09 MED ORDER — METHYLPREDNISOLONE SODIUM SUCC 40 MG IJ SOLR
40.0000 mg | INTRAMUSCULAR | Status: DC
  Administered 2018-06-09: 40 mg via INTRAVENOUS
  Filled 2018-06-09: qty 1

## 2018-06-09 NOTE — Progress Notes (Signed)
Occupational Therapy Treatment Patient Details Name: Theresa Fernandez MRN: [id number] DOB: May 31, 1956 Today's Date: 06/09/2018    History of present illness Pt is a 62 y.o. female with PMH significant for COPD, HTN, depression, anxiety with who presented to the ED with complaints of SOB, fatigue, wheezing, cough, BLE edema, increased orthopenea.   OT comments  Pt educated and verbalized back to therapist energy conservation and back precautions. Pt reports pending back surgery so education on back health provided. Pt current expressed difficulty with food options due to lack of dentures. Pt up in chair and transferring supervision with RW this session.    Follow Up Recommendations  Supervision/Assistance - 24 hour    Equipment Recommendations  None recommended by OT    Recommendations for Other Services      Precautions / Restrictions Precautions Precautions: Fall Precaution Comments: chronic compression fractures in back (verbalized pending surg) Restrictions Weight Bearing Restrictions: No       Mobility Bed Mobility Overal bed mobility: Modified Independent             General bed mobility comments: HOB elevated this session  Transfers Overall transfer level: Needs assistance Equipment used: Rolling walker (2 wheeled) Transfers: Sit to/from Stand Sit to Stand: Supervision         General transfer comment: pt pulling on RW even with cues to use hand on bed.     Balance                                           ADL either performed or assessed with clinical judgement   ADL Overall ADL's : Needs assistance/impaired Eating/Feeding: Independent   Grooming: Independent               Lower Body Dressing: Supervision/safety;Sit to/from stand;With adaptive equipment Lower Body Dressing Details (indicate cue type and reason): i never thought about using that reacher Toilet Transfer: Modified Independent;RW Toilet Transfer Details  (indicate cue type and reason): simulated bed to chair   Toileting - Clothing Manipulation Details (indicate cue type and reason): educated on toilet tongs. pt has [place] prime and plans to purchase from that website       General ADL Comments: pt educated on energy conservation with handout. pt educated and demonstrates use of Reacher this session. pt has a reacher at home baseline. pt cares for 63 yo demented mother in the home. pt educated on strategies to help with body mechanics when assisting mother. pt  educated on back precautions and body mechanics for personal adls.      Vision       Perception     Praxis      Cognition Arousal/Alertness: Awake/alert Behavior During Therapy: WFL for tasks assessed/performed Overall Cognitive Status: Within Functional Limits for tasks assessed                                          Exercises     Shoulder Instructions       General Comments      Pertinent Vitals/ Pain       Pain Assessment: No/denies pain  Home Living  Prior Functioning/Environment              Frequency  Min 2X/week        Progress Toward Goals  OT Goals(current goals can now be found in the care plan section)  Progress towards OT goals: Progressing toward goals  Acute Rehab OT Goals Patient Stated Goal: to take care of momma OT Goal Formulation: With patient Time For Goal Achievement: 06/21/18 Potential to Achieve Goals: Good ADL Goals Pt Will Perform Grooming: with modified independence;standing Pt Will Perform Upper Body Dressing: with modified independence;sitting Pt Will Perform Lower Body Dressing: with supervision;sit to/from stand Pt Will Transfer to Toilet: with modified independence;ambulating Pt Will Perform Toileting - Clothing Manipulation and hygiene: with modified independence;sit to/from stand Additional ADL Goal #1: Pt will recall 3 ways of  conserving energy during ADL at indpendent level  Plan Discharge plan remains appropriate    Co-evaluation                 AM-PAC PT "6 Clicks" Daily Activity     Outcome Measure   Help from another person eating meals?: None Help from another person taking care of personal grooming?: A Little Help from another person toileting, which includes using toliet, bedpan, or urinal?: A Little Help from another person bathing (including washing, rinsing, drying)?: A Little Help from another person to put on and taking off regular upper body clothing?: A Little Help from another person to put on and taking off regular lower body clothing?: A Little 6 Click Score: 19    End of Session Equipment Utilized During Treatment: Rolling walker  OT Visit Diagnosis: Unsteadiness on feet (R26.81);Muscle weakness (generalized) (M62.81)   Activity Tolerance Patient tolerated treatment well   Patient Left in chair;with call bell/phone within reach   Nurse Communication Mobility status;Precautions        Time: 0923-3007 OT Time Calculation (min): 43 min  Charges: OT General Charges $OT Visit: 1 Visit OT Treatments $Self Care/Home Management : 38-52 mins   Jeri Modena, OTR/L  Acute Rehabilitation Services Pager: [phone] Office: [phone] .    Jeri Modena 06/09/2018, 9:47 AM

## 2018-06-09 NOTE — Progress Notes (Signed)
SATURATION QUALIFICATIONS: (This note is used to comply with regulatory documentation for home oxygen)  Patient Saturations on Room Air at Rest = 93%  Patient Saturations on Room Air while Ambulating = 87%  Patient Saturations on 3 Liters of oxygen while Ambulating = 95%  Please briefly explain why patient needs home oxygen: Patient had some SOB while ambulating and O2 sats. Dropped into the 80s while ambulating in hall and required oxygen to bring back O2 sats into 90s.

## 2018-06-09 NOTE — Progress Notes (Signed)
PROGRESS NOTE    Theresa Fernandez   UXL:[id number]  DOB: [birth year]  DOA: 06/06/2018 PCP: Theodis Blaze, MD   Brief Narrative:  Theresa Fernandez is a 62 y/o with COPD, hypertension, depression, anxietyday with who presented to the EDthis afternoon with complaints of shortness of breath, increasing pedal edema, 20 lb weight gain in 6-8 mo, fatigue, wheezing and cough. Stopped smoking 1 wk ago. Nebs did not help at home.  In ED > Respiratory rate was 24-34, blood pressure 110s to 130s over 50s to 70s.  Heart rate 99 on arrival, 130s after continuous nebs.  Oxygen saturation 92%- 100% on room air.   Chest x-ray showed interstitial edema. ABG showed a pH of 7.335, PCO2 of 40.2, PO2 of 76, bicarb of 21.4.  Subjective: Coughing up brown sputum, congested and having trouble sleeping. Pain in back from fracture is worse with coughing.    Assessment & Plan:   Principal Problem:   COPD with acute exacerbation - short of breath and hypoxic down to 87% with ambulation - cough is severe- add Tussionex, Mucinex, flutter valve - cont Doxy, IV steriods and Nebs- change Duonebs to Q 6 routine and Albuterol to Q2 PRN Active Problems:    Cellulitis of abdominal wall? - she feels it was likely dependent edema and has resolved    Acute on chronic heart failure with preserved ejection fraction (HFpEF) - given a dose of Lasix    CKD (chronic kidney disease) stage 3, GFR 30-59 ml/min - stable    Depression - stable on home meds    DM2 (diabetes mellitus, type 2)  - Actos on hold- sugars are actually normal- d/c SSI    Dyslipidemia - Pravastatin    Spinal compression fracture - states she will get a kyphoplasty as outpt- cont pain meds   DVT prophylaxis: Lovenox Code Status: Full code Family Communication:  Disposition Plan: home possibly tomorrow Consultants:   none Procedures:   none Antimicrobials:  Anti-infectives (From admission, onward)   Start     Dose/Rate Route  Frequency Ordered Stop   06/08/18 1000  doxycycline (VIBRA-TABS) tablet 100 mg     100 mg Oral Every 12 hours 06/08/18 0722     06/07/18 1800  doxycycline (VIBRAMYCIN) 100 mg in sodium chloride 0.9 % 250 mL IVPB  Status:  Discontinued     100 mg 125 mL/hr over 120 Minutes Intravenous Every 12 hours 06/07/18 1346 06/08/18 0722   06/07/18 1200  cefTRIAXone (ROCEPHIN) 1 g in sodium chloride 0.9 % 100 mL IVPB  Status:  Discontinued     1 g 200 mL/hr over 30 Minutes Intravenous Every 24 hours 06/06/18 1813 06/07/18 1343   06/06/18 2200  doxycycline (VIBRA-TABS) tablet 100 mg  Status:  Discontinued     100 mg Oral Every 12 hours 06/06/18 1733 06/06/18 1810   06/06/18 2000  azithromycin (ZITHROMAX) 500 mg in sodium chloride 0.9 % 250 mL IVPB  Status:  Discontinued     500 mg 250 mL/hr over 60 Minutes Intravenous Every 24 hours 06/06/18 1813 06/08/18 0722   06/06/18 1630  doxycycline (VIBRAMYCIN) 100 mg in sodium chloride 0.9 % 250 mL IVPB  Status:  Discontinued     100 mg 125 mL/hr over 120 Minutes Intravenous  Once 06/06/18 1551 06/06/18 1900   06/06/18 1530  cefTRIAXone (ROCEPHIN) 2 g in sodium chloride 0.9 % 100 mL IVPB     2 g 200 mL/hr over 30 Minutes Intravenous  Once 06/06/18 1515 06/06/18  1607       Objective: Vitals:   06/08/18 1942 06/08/18 2027 06/09/18 0059 06/09/18 0436  BP: 133/87  135/84 129/75  Pulse:    86  Resp:    20  Temp: 98.7 F (37.1 C)   98.5 F (36.9 C)  TempSrc: Oral   Oral  SpO2:  96%  95%  Weight:    118.6 kg  Height:        Intake/Output Summary (Last 24 hours) at 06/09/2018 0844 Last data filed at 06/09/2018 0708 Gross per 24 hour  Intake 1200 ml  Output 2450 ml  Net -1250 ml   Filed Weights   06/06/18 1456 06/08/18 0422 06/09/18 0436  Weight: 113.4 kg 118.6 kg 118.6 kg    Examination: General exam: Appears comfortable  HEENT: PERRLA, oral mucosa moist, no sclera icterus or thrush Respiratory system: diffuse rhonchi and wheezing, has  cough Cardiovascular system: S1 & S2 heard, RRR.   Gastrointestinal system: Abdomen soft, non-tender, nondistended. Normal bowel sounds. Central nervous system: Alert and oriented. No focal neurological deficits. Extremities: No cyanosis, clubbing or edema Skin: No rashes or ulcers Psychiatry:  Mood & affect appropriate.     Data Reviewed: I have personally reviewed following labs and imaging studies  CBC: Recent Labs  Lab 06/06/18 1458 06/06/18 1508 06/07/18 0511 06/08/18 0431 06/09/18 0339  WBC 9.5  --  12.5* 16.4* 15.0*  NEUTROABS 6.6  --   --   --   --   HGB 11.9* 12.9 10.6* 10.8* 10.7*  HCT 39.0 38.0 36.2 36.5 35.0*  MCV 98.0  --  98.9 98.9 97.2  PLT 315  --  335 336 660   Basic Metabolic Panel: Recent Labs  Lab 06/06/18 1458 06/06/18 1508 06/07/18 0511 06/08/18 0431 06/09/18 0339  NA 135 135 138 136 135  K 4.5 4.5 4.0 4.9 4.7  CL 102 103 104 105 102  CO2 26  --  22 23 26   GLUCOSE 85 86 115* 153* 171*  BUN 12 15 18 17 17   CREATININE 1.09* 1.10* 1.36* 1.23* 1.22*  CALCIUM 9.3  --  9.0 8.9 9.1  MG  --   --   --  2.2 2.2   GFR: Estimated Creatinine Clearance: 63.9 mL/min (A) (by C-G formula based on SCr of 1.22 mg/dL (H)). Liver Function Tests: Recent Labs  Lab 06/06/18 1458 06/08/18 0431  AST 21 19  ALT 14 15  ALKPHOS 103 91  BILITOT 0.8 0.5  PROT 7.4 7.1  ALBUMIN 3.7 3.4*   No results for input(s): LIPASE, AMYLASE in the last 168 hours. No results for input(s): AMMONIA in the last 168 hours. Coagulation Profile: Recent Labs  Lab 06/08/18 0431  INR 1.04   Cardiac Enzymes: Recent Labs  Lab 06/06/18 1458  TROPONINI <0.03   BNP (last 3 results) No results for input(s): PROBNP in the last 8760 hours. HbA1C: No results for input(s): HGBA1C in the last 72 hours. CBG: Recent Labs  Lab 06/08/18 0759 06/08/18 1208 06/08/18 1703 06/08/18 2059 06/09/18 0748  GLUCAP 137* 116* 128* 92 166*   Lipid Profile: No results for input(s): CHOL,  HDL, LDLCALC, TRIG, CHOLHDL, LDLDIRECT in the last 72 hours. Thyroid Function Tests: No results for input(s): TSH, T4TOTAL, FREET4, T3FREE, THYROIDAB in the last 72 hours. Anemia Panel: No results for input(s): VITAMINB12, FOLATE, FERRITIN, TIBC, IRON, RETICCTPCT in the last 72 hours. Urine analysis:    Component Value Date/Time   COLORURINE YELLOW 03/06/2015 1205   APPEARANCEUR  CLEAR 03/06/2015 1205   LABSPEC 1.012 03/06/2015 1205   PHURINE 6.0 03/06/2015 1205   GLUCOSEU NEGATIVE 03/06/2015 1205   HGBUR NEGATIVE 03/06/2015 1205   [place] 03/06/2015 [place] 03/06/2015 [place] 03/06/2015 1205   UROBILINOGEN 0.2 03/06/2015 1205   NITRITE NEGATIVE 03/06/2015 1205   LEUKOCYTESUR NEGATIVE 03/06/2015 1205   Sepsis Labs: @LABRCNTIP (procalcitonin:4,lacticidven:4) ) Recent Results (from the past 240 hour(s))  Blood culture (routine x 2)     Status: None (Preliminary result)   Collection Time: 06/06/18  3:05 PM  Result Value Ref Range Status   Specimen Description BLOOD RIGHT ANTECUBITAL  Final   Special Requests   Final    BOTTLES DRAWN AEROBIC AND ANAEROBIC Blood Culture adequate volume   Culture   Final    NO GROWTH 2 DAYS Performed at [facility], [place] [address], [place], [place] [address]    Report Status PENDING  Incomplete  Blood culture (routine x 2)     Status: None (Preliminary result)   Collection Time: 06/06/18  3:18 PM  Result Value Ref Range Status   Specimen Description BLOOD RIGHT FOREARM  Final   Special Requests   Final    BOTTLES DRAWN AEROBIC AND ANAEROBIC Blood Culture adequate volume   Culture   Final    NO GROWTH 2 DAYS Performed at [facility], [place] [address], [place], [place] [address]    Report Status PENDING  Incomplete  MRSA PCR Screening     Status: None   Collection Time: 06/07/18  6:58 PM  Result Value Ref Range Status   MRSA by PCR NEGATIVE NEGATIVE Final    Comment:        The  GeneXpert MRSA Assay (FDA approved for NASAL specimens only), is one component of a comprehensive MRSA colonization surveillance program. It is not intended to diagnose MRSA infection nor to guide or monitor treatment for MRSA infections. Performed at [facility], [place] [address], [place], [place] [address]          Radiology Studies: Ct Thoracic Spine Wo Contrast  Result Date: 06/08/2018 CLINICAL DATA:  Back pain.  History of multiple spinal fractures EXAM: CT THORACIC AND LUMBAR SPINE WITHOUT CONTRAST TECHNIQUE: Multidetector CT imaging of the thoracic and lumbar spine was performed without contrast. Multiplanar CT image reconstructions were also generated. COMPARISON:  MRI thoracic and lumbar spine 05/08/2018 FINDINGS: CT THORACIC SPINE FINDINGS Alignment: Mild anterolisthesis T9-10 unchanged from the prior study. Mild retrolisthesis T5-6. Mild thoracic kyphosis Vertebrae: Multiple thoracic compression fractures unchanged from the prior MRI. No acute fracture. Mild compression fracture superior endplate of T3 is chronic Mild compression fracture T5 is chronic and unchanged Mild compression fracture T6 is chronic and unchanged Chronic compression or T7 with vertebroplasty unchanged Mild compression fracture T8 with vertebroplasty unchanged Mild compression fracture inferior endplate of T9 is unchanged. Severe compression fracture T10 with cement vertebral augmentation unchanged from the prior study. Paraspinal and other soft tissues: Cardiac enlargement. Left lower lobe atelectasis and small effusion. Small right effusion Disc levels: Multilevel mild disc degeneration throughout the thoracic spine. Disc and facet degeneration and mild anterolisthesis T9-10 without significant stenosis. Disc and facet degeneration at T11-12 and T12-L1 without significant stenosis. CT LUMBAR SPINE FINDINGS Segmentation: Normal Alignment: Mild anterolisthesis L4-5 Vertebrae: Severe compression fracture of  L1 is unchanged from the prior MRI. No retropulsion of bone into the canal. Negative for stenosis. No posterior element fracture. Inferior endplate  fracture of L3 is unchanged from the prior study Moderate compression fracture L4 unchanged from the prior study Mild compression fracture of L5 has progressed since the prior study compatible with recent fracture. Paraspinal and other soft tissues: Mild atherosclerotic aorta. Negative for paraspinous mass or fluid collection. Disc levels: L1-2: Diffuse disc bulging without stenosis L2-3: Moderate disc bulging. Facet degeneration and mild spinal stenosis L3-4: Moderate disc bulging and facet hypertrophy. Moderate spinal stenosis. Moderate subarticular stenosis bilaterally L4-5: Mild anterolisthesis. Diffuse disc bulging with severe facet degeneration. Moderate spinal stenosis. L5-S1: Mild disc and facet degeneration without stenosis IMPRESSION: 1. Multiple thoracic compression fractures unchanged from the MRI of 05/08/2018. 2. Multiple lumbar compression fractures which are unchanged from the prior MRI. Further fracture of the L5 vertebral body since the prior MRI compatible with recent fracture. 3. Multilevel degenerative change in the lumbar spine with moderate spinal stenosis at L3-4 L4-5. Electronically Signed   By: Franchot Gallo M.D.   On: 06/08/2018 15:54   Ct Lumbar Spine Wo Contrast  Result Date: 06/08/2018 CLINICAL DATA:  Back pain.  History of multiple spinal fractures EXAM: CT THORACIC AND LUMBAR SPINE WITHOUT CONTRAST TECHNIQUE: Multidetector CT imaging of the thoracic and lumbar spine was performed without contrast. Multiplanar CT image reconstructions were also generated. COMPARISON:  MRI thoracic and lumbar spine 05/08/2018 FINDINGS: CT THORACIC SPINE FINDINGS Alignment: Mild anterolisthesis T9-10 unchanged from the prior study. Mild retrolisthesis T5-6. Mild thoracic kyphosis Vertebrae: Multiple thoracic compression fractures unchanged from the  prior MRI. No acute fracture. Mild compression fracture superior endplate of T3 is chronic Mild compression fracture T5 is chronic and unchanged Mild compression fracture T6 is chronic and unchanged Chronic compression or T7 with vertebroplasty unchanged Mild compression fracture T8 with vertebroplasty unchanged Mild compression fracture inferior endplate of T9 is unchanged. Severe compression fracture T10 with cement vertebral augmentation unchanged from the prior study. Paraspinal and other soft tissues: Cardiac enlargement. Left lower lobe atelectasis and small effusion. Small right effusion Disc levels: Multilevel mild disc degeneration throughout the thoracic spine. Disc and facet degeneration and mild anterolisthesis T9-10 without significant stenosis. Disc and facet degeneration at T11-12 and T12-L1 without significant stenosis. CT LUMBAR SPINE FINDINGS Segmentation: Normal Alignment: Mild anterolisthesis L4-5 Vertebrae: Severe compression fracture of L1 is unchanged from the prior MRI. No retropulsion of bone into the canal. Negative for stenosis. No posterior element fracture. Inferior endplate fracture of L3 is unchanged from the prior study Moderate compression fracture L4 unchanged from the prior study Mild compression fracture of L5 has progressed since the prior study compatible with recent fracture. Paraspinal and other soft tissues: Mild atherosclerotic aorta. Negative for paraspinous mass or fluid collection. Disc levels: L1-2: Diffuse disc bulging without stenosis L2-3: Moderate disc bulging. Facet degeneration and mild spinal stenosis L3-4: Moderate disc bulging and facet hypertrophy. Moderate spinal stenosis. Moderate subarticular stenosis bilaterally L4-5: Mild anterolisthesis. Diffuse disc bulging with severe facet degeneration. Moderate spinal stenosis. L5-S1: Mild disc and facet degeneration without stenosis IMPRESSION: 1. Multiple thoracic compression fractures unchanged from the MRI of  05/08/2018. 2. Multiple lumbar compression fractures which are unchanged from the prior MRI. Further fracture of the L5 vertebral body since the prior MRI compatible with recent fracture. 3. Multilevel degenerative change in the lumbar spine with moderate spinal stenosis at L3-4 L4-5. Electronically Signed   By: Franchot Gallo M.D.   On: 06/08/2018 15:54      Scheduled Meds: . albuterol  2.5 mg Nebulization Q4H  . amphetamine-dextroamphetamine  20 mg  Oral TID  . carisoprodol  350 mg Oral QHS  . doxycycline  100 mg Oral Q12H  . enoxaparin (LOVENOX) injection  40 mg Subcutaneous Q24H  . famotidine  20 mg Oral BID  . lamoTRIgine  25 mg Oral Daily  . magnesium gluconate  500 mg Oral BID  . methylPREDNISolone (SOLU-MEDROL) injection  40 mg Intravenous Q24H  . pravastatin  20 mg Oral q1800  . pregabalin  100 mg Oral BID  . sertraline  100 mg Oral BID  . sodium chloride flush  3 mL Intravenous Q12H   Continuous Infusions: . sodium chloride Stopped (06/06/18 2052)  . sodium chloride       LOS: 3 days    Time spent in minutes: 35    Debbe Odea, MD [facility] Pager: [email or web address] Password [facility] 06/09/2018, 8:44 AM

## 2018-06-09 NOTE — Progress Notes (Signed)
Physical Therapy Treatment Patient Details Name: Theresa Fernandez MRN: [id number] DOB: 1956/04/05 Today's Date: 06/09/2018    History of Present Illness Pt is a 62 y.o. female admitted 06/06/18 with SOB, fatigue and cough. Worked up for CHF and COPD exacerbation. PMH includes COPD, HTN, depression, anxiety, chronic compression fxs (per pt, potential for sx with Dumonski).   PT Comments    Pt progressing well with mobility. Mod indep with transfers; ambulatory with RW at supervision-level. SpO2 down to 86% on RA while walking. Replaced 2L O2 [place] and SpO2 down to 83% (unsure if reliable pleth, as SpO2 94% upon return to room and maintaining >90% on RA). Pt remains limited by back pain and decreased activity tolerance. Will continue to follow acutely.    Follow Up Recommendations  Home health PT;Supervision for mobility/OOB     Equipment Recommendations  None recommended by PT    Recommendations for Other Services       Precautions / Restrictions Precautions Precautions: Fall;Back Precaution Comments: chronic spinal compression fxs (verbalized pending sx with Dumonski); reviewed back precautions, pt aware of these but requires intermittent cues, especially with twisting Restrictions Weight Bearing Restrictions: No    Mobility  Bed Mobility Overal bed mobility: Modified Independent Bed Mobility: Supine to Sit;Sit to Supine     Supine to sit: Modified independent (Device/Increase time) Sit to supine: Modified independent (Device/Increase time)   General bed mobility comments: HOB elevated  Transfers Overall transfer level: Modified independent Equipment used: Rolling walker (2 wheeled) Transfers: Sit to/from Stand Sit to Stand: Modified independent (Device/Increase time)         General transfer comment: Pt prefers to pull on RW despite cues for correct hand placement; mod indep standing from bed and BSC multiple times  Ambulation/Gait Ambulation/Gait assistance:  Supervision Gait Distance (Feet): 200 Feet Assistive device: Rolling walker (2 wheeled) Gait Pattern/deviations: Step-through pattern;Decreased stride length;Trunk flexed Gait velocity: Decreased Gait velocity interpretation: 1.31 - 2.62 ft/sec, indicative of limited community ambulator General Gait Details: Amb 38' with SpO2 down to 86% on RA. Seated rest, then amb additional 140' on 2L O2 [place] with SpO2 down to 83% (unsure if reliable pleth; SpO2 94% on RA upon sitting in room). Supervision for balance; good technique with RW   Stairs             Wheelchair Mobility    Modified Rankin (Stroke Patients Only)       Balance Overall balance assessment: Needs assistance   Sitting balance-Leahy Scale: Good       Standing balance-Leahy Scale: Fair Standing balance comment: Can static stand and take steps without DME; stability improved with UE support                            Cognition Arousal/Alertness: Awake/alert Behavior During Therapy: WFL for tasks assessed/performed Overall Cognitive Status: Within Functional Limits for tasks assessed                                        Exercises      General Comments        Pertinent Vitals/Pain Pain Assessment: Faces Faces Pain Scale: Hurts a little bit Pain Location: Back Pain Descriptors / Indicators: Sore;Constant Pain Intervention(s): Monitored during session;Premedicated before session    Home Living  Prior Function            PT Goals (current goals can now be found in the care plan section) Acute Rehab PT Goals Patient Stated Goal: To return home and continue caring for mother; reduced pain with back sx PT Goal Formulation: With patient Time For Goal Achievement: 06/21/18 Potential to Achieve Goals: Good Progress towards PT goals: Progressing toward goals    Frequency    Min 3X/week      PT Plan Current plan remains appropriate     Co-evaluation              AM-PAC PT "6 Clicks" Daily Activity  Outcome Measure  Difficulty turning over in bed (including adjusting bedclothes, sheets and blankets)?: A Little Difficulty moving from lying on back to sitting on the side of the bed? : A Little Difficulty sitting down on and standing up from a chair with arms (e.g., wheelchair, bedside commode, etc,.)?: None Help needed moving to and from a bed to chair (including a wheelchair)?: None Help needed walking in hospital room?: A Little Help needed climbing 3-5 steps with a railing? : A Lot 6 Click Score: 19    End of Session Equipment Utilized During Treatment: Gait belt;Oxygen Activity Tolerance: Patient tolerated treatment well Patient left: in bed;with call bell/phone within reach Nurse Communication: Mobility status PT Visit Diagnosis: Unsteadiness on feet (R26.81)     Time: 3276-1470 PT Time Calculation (min) (ACUTE ONLY): 20 min  Charges:  $Gait Training: 8-22 mins                    Mabeline Caras, PT, DPT Acute Rehabilitation Services  Pager [phone] Office [place] 06/09/2018, 4:39 PM

## 2018-06-10 LAB — BASIC METABOLIC PANEL
ANION GAP: 7 (ref 5–15)
BUN: 17 mg/dL (ref 8–23)
CALCIUM: 9.3 mg/dL (ref 8.9–10.3)
CO2: 27 mmol/L (ref 22–32)
Chloride: 102 mmol/L (ref 98–111)
Creatinine, Ser: 1.18 mg/dL — ABNORMAL HIGH (ref 0.44–1.00)
GFR calc Af Amer: 56 mL/min — ABNORMAL LOW (ref >60)
GFR calc non Af Amer: 48 mL/min — ABNORMAL LOW (ref >60)
GLUCOSE: 158 mg/dL — AB (ref 70–99)
Potassium: 4.6 mmol/L (ref 3.5–5.1)
Sodium: 136 mmol/L (ref 135–145)

## 2018-06-10 LAB — GLUCOSE, CAPILLARY
Glucose-Capillary: 107 mg/dL — ABNORMAL HIGH (ref 70–99)
Glucose-Capillary: 105 mg/dL — ABNORMAL HIGH (ref 70–99)
Glucose-Capillary: 159 mg/dL — ABNORMAL HIGH (ref 70–99)
Glucose-Capillary: 169 mg/dL — ABNORMAL HIGH (ref 70–99)

## 2018-06-10 LAB — CBC
HCT: 37.3 % (ref 36.0–46.0)
Hemoglobin: 11.5 g/dL — ABNORMAL LOW (ref 12.0–15.0)
MCH: 29.9 pg (ref 26.0–34.0)
MCHC: 30.8 g/dL (ref 30.0–36.0)
MCV: 97.1 fL (ref 80.0–100.0)
PLATELETS: 327 10*3/uL (ref 150–400)
RBC: 3.84 MIL/uL — AB (ref 3.87–5.11)
RDW: 14.1 % (ref 11.5–15.5)
WBC: 13.1 10*3/uL — ABNORMAL HIGH (ref 4.0–10.5)
nRBC: 0 % (ref 0.0–0.2)

## 2018-06-10 MED ORDER — FUROSEMIDE 10 MG/ML IJ SOLN
40.0000 mg | Freq: Two times a day (BID) | INTRAMUSCULAR | Status: DC
  Administered 2018-06-10 – 2018-06-12 (×5): 40 mg via INTRAVENOUS
  Filled 2018-06-10 (×5): qty 4

## 2018-06-10 MED ORDER — SENNOSIDES-DOCUSATE SODIUM 8.6-50 MG PO TABS
1.0000 | ORAL_TABLET | Freq: Two times a day (BID) | ORAL | Status: DC
  Administered 2018-06-10 – 2018-06-12 (×4): 1 via ORAL
  Filled 2018-06-10 (×4): qty 1

## 2018-06-10 MED ORDER — SENNOSIDES-DOCUSATE SODIUM 8.6-50 MG PO TABS: 2.0000 | ORAL_TABLET | Freq: Two times a day (BID) | ORAL | Status: DC

## 2018-06-10 MED ORDER — METHYLPREDNISOLONE SODIUM SUCC 125 MG IJ SOLR
60.0000 mg | Freq: Four times a day (QID) | INTRAMUSCULAR | Status: DC
  Administered 2018-06-10 – 2018-06-12 (×9): 60 mg via INTRAVENOUS
  Filled 2018-06-10 (×11): qty 2

## 2018-06-10 NOTE — Progress Notes (Signed)
PROGRESS NOTE  Theresa Fernandez RFF:[id number] DOB: [birth year] DOA: 06/06/2018 PCP: Theodis Blaze, MD  HPI/Recap of past 24 hours: Theresa Fernandez is a 62 y/o with COPD, hypertension, depression, anxietyday with who presented to the EDthis afternoon with complaints of shortness of breath, increasing pedal edema, 20 lb weight gain in 6-8 mo, fatigue, wheezing and cough. Stopped smoking 1 wk ago. Nebs did not help at home.  In ED > Respiratory rate was 24-34, blood pressure 110sto 130s over 50s to 70s. Heart rate 99 on arrival, 130safter continuous nebs. Oxygen saturation 92%- 100%on room air. Chest x-ray showed interstitial edema. ABG showed a pH of 7.335, PCO2 of 40.2, PO2 of 76, bicarb of 21.4.  06/10/2018: Patient seen and examined at her bedside.  Reports she still wheezy and still feels short of breath with minimal exertion.  Increase Solu-Medrol to 60 mg every 6 hours and more frequent breathing treatments.  Added scheduled Lasix 40 twice daily.  Repeat home O2 evaluation tomorrow 06/11/2018.   Assessment/Plan: Principal Problem:   COPD with acute exacerbation ([place]) Active Problems:   HTN (hypertension)   Depression   DM2 (diabetes mellitus, type 2) ([place])   Dyslipidemia   Spinal compression fracture (HCC)   Cellulitis of abdominal wall   Acute on chronic heart failure with preserved ejection fraction (HFpEF) (HCC)   CKD (chronic kidney disease) stage 3, GFR 30-59 ml/min (HCC)   Acute exacerbation of COPD Continue doxycycline, 1200 mg twice daily of Mucinex Started Solu-Medrol 60 mg every 6 hours and IV Lasix 40 mg twice daily Continue DuoNeb every 6 hours and every 2 hours as needed  Acute hypoxic respiratory failure secondary to acute COPD exacerbation Desaturation in the 80s on ambulation Management as stated above Maintain O2 saturation greater than 90%  Acute on chronic heart failure with preserved ejection fraction Continue Lasix 40 mg twice daily  IV Monitor urine output Monitor electrolytes  CKD 3 Baseline GFR 30-59 Appears stable Avoid nephrotoxic agents/dehydration/hypotension  Depression Appears stable Continue home medications  Type 2 diabetes Appears stable Continue insulin sliding scale  Hyperlipidemia Continue statin  History of spinal compression fracture Plan for kyphoplasty outpatient Continue pain medications as needed    Code Status: Full code  Family Communication: None at bedside  Disposition Plan: Possibly tomorrow 06/11/2018   Consultants:  None  Procedures:  None  Antimicrobials:  Doxycycline  DVT prophylaxis: Subcu Lovenox daily   Objective: Vitals:   06/10/18 0422 06/10/18 0850 06/10/18 1351 06/10/18 1430  BP: (!) 103/46   127/72  Pulse: 88   (!) 103  Resp:    20  Temp:    98.9 F (37.2 C)  TempSrc:    Oral  SpO2: 95% 95% 94% 90%  Weight: 118.5 kg     Height:        Intake/Output Summary (Last 24 hours) at 06/10/2018 1629 Last data filed at 06/10/2018 1431 Gross per 24 hour  Intake 480 ml  Output 2800 ml  Net -2320 ml   Filed Weights   06/08/18 0422 06/09/18 0436 06/10/18 0422  Weight: 118.6 kg 118.6 kg 118.5 kg    Exam:  . General: 62 y.o. year-old female obese in no acute distress.  Alert and oriented x3. . Cardiovascular: Regular rate and rhythm with no rubs or gallops.  No thyromegaly or JVD noted.   Marland Kitchen Respiratory: Diffuse wheezes bilaterally with mild rales at bases. Good inspiratory effort. . Abdomen: Soft nontender nondistended with normal bowel sounds x4 quadrants. Marland Kitchen  Musculoskeletal: No lower extremity edema. 2/4 pulses in all 4 extremities. . Skin: No ulcerative lesions noted or rashes . Psychiatry: Mood is appropriate for condition and setting   Data Reviewed: CBC: Recent Labs  Lab 06/06/18 1458 06/06/18 1508 06/07/18 0511 06/08/18 0431 06/09/18 0339 06/10/18 0252  WBC 9.5  --  12.5* 16.4* 15.0* 13.1*  NEUTROABS 6.6  --   --   --   --    --   HGB 11.9* 12.9 10.6* 10.8* 10.7* 11.5*  HCT 39.0 38.0 36.2 36.5 35.0* 37.3  MCV 98.0  --  98.9 98.9 97.2 97.1  PLT 315  --  335 336 299 170   Basic Metabolic Panel: Recent Labs  Lab 06/06/18 1458 06/06/18 1508 06/07/18 0511 06/08/18 0431 06/09/18 0339 06/10/18 0252  NA 135 135 138 136 135 136  K 4.5 4.5 4.0 4.9 4.7 4.6  CL 102 103 104 105 102 102  CO2 26  --  22 23 26 27   GLUCOSE 85 86 115* 153* 171* 158*  BUN 12 15 18 17 17 17   CREATININE 1.09* 1.10* 1.36* 1.23* 1.22* 1.18*  CALCIUM 9.3  --  9.0 8.9 9.1 9.3  MG  --   --   --  2.2 2.2  --    GFR: Estimated Creatinine Clearance: 66 mL/min (A) (by C-G formula based on SCr of 1.18 mg/dL (H)). Liver Function Tests: Recent Labs  Lab 06/06/18 1458 06/08/18 0431  AST 21 19  ALT 14 15  ALKPHOS 103 91  BILITOT 0.8 0.5  PROT 7.4 7.1  ALBUMIN 3.7 3.4*   No results for input(s): LIPASE, AMYLASE in the last 168 hours. No results for input(s): AMMONIA in the last 168 hours. Coagulation Profile: Recent Labs  Lab 06/08/18 0431  INR 1.04   Cardiac Enzymes: Recent Labs  Lab 06/06/18 1458  TROPONINI <0.03   BNP (last 3 results) No results for input(s): PROBNP in the last 8760 hours. HbA1C: No results for input(s): HGBA1C in the last 72 hours. CBG: Recent Labs  Lab 06/09/18 1556 06/09/18 2142 06/10/18 0739 06/10/18 1123 06/10/18 1615  GLUCAP 91 91 107* 105* 159*   Lipid Profile: No results for input(s): CHOL, HDL, LDLCALC, TRIG, CHOLHDL, LDLDIRECT in the last 72 hours. Thyroid Function Tests: No results for input(s): TSH, T4TOTAL, FREET4, T3FREE, THYROIDAB in the last 72 hours. Anemia Panel: No results for input(s): VITAMINB12, FOLATE, FERRITIN, TIBC, IRON, RETICCTPCT in the last 72 hours. Urine analysis:    Component Value Date/Time   COLORURINE YELLOW 03/06/2015 [place] 03/06/2015 1205   LABSPEC 1.012 03/06/2015 1205   PHURINE 6.0 03/06/2015 1205   GLUCOSEU NEGATIVE 03/06/2015  1205   HGBUR NEGATIVE 03/06/2015 1205   [place] 03/06/2015 [place] 03/06/2015 1205   PROTEINUR NEGATIVE 03/06/2015 1205   UROBILINOGEN 0.2 03/06/2015 1205   NITRITE NEGATIVE 03/06/2015 1205   LEUKOCYTESUR NEGATIVE 03/06/2015 1205   Sepsis Labs: @LABRCNTIP (procalcitonin:4,lacticidven:4)  ) Recent Results (from the past 240 hour(s))  Blood culture (routine x 2)     Status: None (Preliminary result)   Collection Time: 06/06/18  3:05 PM  Result Value Ref Range Status   Specimen Description BLOOD RIGHT ANTECUBITAL  Final   Special Requests   Final    BOTTLES DRAWN AEROBIC AND ANAEROBIC Blood Culture adequate volume   Culture   Final    NO GROWTH 4 DAYS Performed at [facility], [place] [address] [place], [place] [address]  Report Status PENDING  Incomplete  Blood culture (routine x 2)     Status: None (Preliminary result)   Collection Time: 06/06/18  3:18 PM  Result Value Ref Range Status   Specimen Description BLOOD RIGHT FOREARM  Final   Special Requests   Final    BOTTLES DRAWN AEROBIC AND ANAEROBIC Blood Culture adequate volume   Culture   Final    NO GROWTH 4 DAYS Performed at [facility], [address] [place], [place] [address]    Report Status PENDING  Incomplete  MRSA PCR Screening     Status: None   Collection Time: 06/07/18  6:58 PM  Result Value Ref Range Status   MRSA by PCR NEGATIVE NEGATIVE Final    Comment:        The GeneXpert MRSA Assay (FDA approved for NASAL specimens only), is one component of a comprehensive MRSA colonization surveillance program. It is not intended to diagnose MRSA infection nor to guide or monitor treatment for MRSA infections. Performed at [facility], [place] [address] [place], [place] [address]       Studies: No results found.  Scheduled Meds: . amphetamine-dextroamphetamine  20 mg Oral TID  . carisoprodol  350 mg Oral QHS  . dextromethorphan-guaiFENesin  1 tablet  Oral BID  . doxycycline  100 mg Oral Q12H  . enoxaparin (LOVENOX) injection  40 mg Subcutaneous Q24H  . famotidine  20 mg Oral BID  . furosemide  40 mg Intravenous BID  . ipratropium-albuterol  3 mL Nebulization Q6H  . lamoTRIgine  25 mg Oral Daily  . magnesium gluconate  500 mg Oral BID  . methylPREDNISolone (SOLU-MEDROL) injection  60 mg Intravenous Q6H  . pravastatin  20 mg Oral q1800  . pregabalin  100 mg Oral BID  . sertraline  100 mg Oral BID  . sodium chloride flush  3 mL Intravenous Q12H    Continuous Infusions: . sodium chloride Stopped (06/06/18 2052)  . sodium chloride       LOS: 4 days     Kayleen Memos, MD [facility] Pager [phone]  If 7PM-7AM, please contact night-coverage [email or web address] Password [facility] 06/10/2018, 4:29 PM

## 2018-06-10 NOTE — Progress Notes (Addendum)
Physical Therapy Treatment Patient Details Name: Theresa Fernandez MRN: [id number] DOB: [birth year] Today's Date: 06/10/2018    History of Present Illness Pt is a 62 y.o. female admitted 06/06/18 with SOB, fatigue and cough. Worked up for CHF and COPD exacerbation. PMH includes COPD, HTN, depression, anxiety, chronic compression fxs (per pt, potential for sx with Dumonski).   PT Comments    Pt progressing with mobility. Ambulatory with RW, only requiring assist for oxygen and lines. SpO2 down to 88% on RA; >94% on 2L O2 [place] with ambulation. Pt transferring to [facility] independently in room. Encouraged pt to continue ambulating with assist from nursing staff and/or family. Will plan for stair training next session.  Pt worried she is beginning to retain fluid again, reports stomach feeling "puffy", etc.    Follow Up Recommendations  Home health PT;Supervision for mobility/OOB     Equipment Recommendations  None recommended by PT    Recommendations for Other Services       Precautions / Restrictions Precautions Precautions: Fall;Back Precaution Comments: chronic spinal compression fxs (pt verbalized pending sx with Dumonski); reviewed back precautions  Restrictions Weight Bearing Restrictions: No    Mobility  Bed Mobility Overal bed mobility: Modified Independent Bed Mobility: Supine to Sit;Sit to Supine     Supine to sit: Modified independent (Device/Increase time) Sit to supine: Modified independent (Device/Increase time)   General bed mobility comments: HOB elevated  Transfers Overall transfer level: Independent Equipment used: Rolling walker (2 wheeled);None Transfers: Sit to/from Stand Sit to Stand: Modified independent (Device/Increase time);Independent         General transfer comment: Indep to stand with and without RW  Ambulation/Gait Ambulation/Gait assistance: Supervision       Gait velocity: Decreased Gait velocity interpretation: 1.31 - 2.62 ft/sec,  indicative of limited community ambulator General Gait Details: Can amb in room without RW, but reaching to furniture for intermittent UE support. SpO2 down to 88% on RA, returning to >94% on 2L O2 [place]. Pt moving well, only requiring supervision for safety/assist with O2 and lines   Stairs Stairs: (Reviewed technique ascending 1 step into home backwards with RW; pt reports familiar with doing this)           Wheelchair Mobility    Modified Rankin (Stroke Patients Only)       Balance Overall balance assessment: Needs assistance   Sitting balance-Leahy Scale: Good       Standing balance-Leahy Scale: Fair Standing balance comment: Can static stand and take steps without DME; stability improved with UE support                            Cognition Arousal/Alertness: Awake/alert Behavior During Therapy: WFL for tasks assessed/performed Overall Cognitive Status: Within Functional Limits for tasks assessed                                        Exercises      General Comments        Pertinent Vitals/Pain Pain Assessment: Faces Faces Pain Scale: Hurts a little bit Pain Location: Back Pain Descriptors / Indicators: Sore;Constant Pain Intervention(s): Monitored during session    Home Living                      Prior Function            PT  Goals (current goals can now be found in the care plan section) Acute Rehab PT Goals Patient Stated Goal: To return home and continue caring for mother; reduced pain with back sx PT Goal Formulation: With patient Time For Goal Achievement: 06/21/18 Potential to Achieve Goals: Good Progress towards PT goals: Progressing toward goals    Frequency    Min 3X/week      PT Plan Current plan remains appropriate    Co-evaluation              AM-PAC PT "6 Clicks" Daily Activity  Outcome Measure  Difficulty turning over in bed (including adjusting bedclothes, sheets and blankets)?:  A Little Difficulty moving from lying on back to sitting on the side of the bed? : A Little Difficulty sitting down on and standing up from a chair with arms (e.g., wheelchair, bedside commode, etc,.)?: None Help needed moving to and from a bed to chair (including a wheelchair)?: None Help needed walking in hospital room?: None Help needed climbing 3-5 steps with a railing? : A Little 6 Click Score: 21    End of Session Equipment Utilized During Treatment: Gait belt;Oxygen Activity Tolerance: Patient tolerated treatment well Patient left: in bed;with call bell/phone within reach Nurse Communication: Mobility status PT Visit Diagnosis: Unsteadiness on feet (R26.81)     Time: 6226-3335 PT Time Calculation (min) (ACUTE ONLY): 19 min  Charges:  $Gait Training: 8-22 mins                    Mabeline Caras, PT, DPT Acute Rehabilitation Services  Pager [phone] Office [place] 06/10/2018, 3:58 PM

## 2018-06-11 ENCOUNTER — Inpatient Hospital Stay (HOSPITAL_COMMUNITY)

## 2018-06-11 LAB — CBC
HEMATOCRIT: 39.1 % (ref 36.0–46.0)
HEMOGLOBIN: 11.8 g/dL — AB (ref 12.0–15.0)
MCH: 28.7 pg (ref 26.0–34.0)
MCHC: 30.2 g/dL (ref 30.0–36.0)
MCV: 95.1 fL (ref 80.0–100.0)
NRBC: 0 % (ref 0.0–0.2)
Platelets: 385 10*3/uL (ref 150–400)
RBC: 4.11 MIL/uL (ref 3.87–5.11)
RDW: 13.6 % (ref 11.5–15.5)
WBC: 12.6 10*3/uL — AB (ref 4.0–10.5)

## 2018-06-11 LAB — GLUCOSE, CAPILLARY
Glucose-Capillary: 152 mg/dL — ABNORMAL HIGH (ref 70–99)
Glucose-Capillary: 158 mg/dL — ABNORMAL HIGH (ref 70–99)
Glucose-Capillary: 186 mg/dL — ABNORMAL HIGH (ref 70–99)
Glucose-Capillary: 171 mg/dL — ABNORMAL HIGH (ref 70–99)

## 2018-06-11 LAB — BASIC METABOLIC PANEL
ANION GAP: 9 (ref 5–15)
BUN: 26 mg/dL — ABNORMAL HIGH (ref 8–23)
CHLORIDE: 97 mmol/L — AB (ref 98–111)
CO2: 30 mmol/L (ref 22–32)
CREATININE: 1.35 mg/dL — AB (ref 0.44–1.00)
Calcium: 9.3 mg/dL (ref 8.9–10.3)
GFR calc non Af Amer: 41 mL/min — ABNORMAL LOW (ref >60)
GFR, EST AFRICAN AMERICAN: 47 mL/min — AB (ref >60)
Glucose, Bld: 156 mg/dL — ABNORMAL HIGH (ref 70–99)
POTASSIUM: 3.9 mmol/L (ref 3.5–5.1)
SODIUM: 136 mmol/L (ref 135–145)

## 2018-06-11 LAB — CULTURE, BLOOD (ROUTINE X 2)
Culture: NO GROWTH
Culture: NO GROWTH
SPECIAL REQUESTS: ADEQUATE
SPECIAL REQUESTS: ADEQUATE

## 2018-06-11 LAB — HIV ANTIBODY (ROUTINE TESTING W REFLEX): HIV Screen 4th Generation wRfx: NONREACTIVE

## 2018-06-11 IMAGING — DX DG ABD PORTABLE 1V
1 series · 1 of 1 positions shown · non-contrast
Comparison: None.

CLINICAL DATA: Hypoxia

EXAM:
PORTABLE ABDOMEN - 1 VIEW

[abdomen kub]
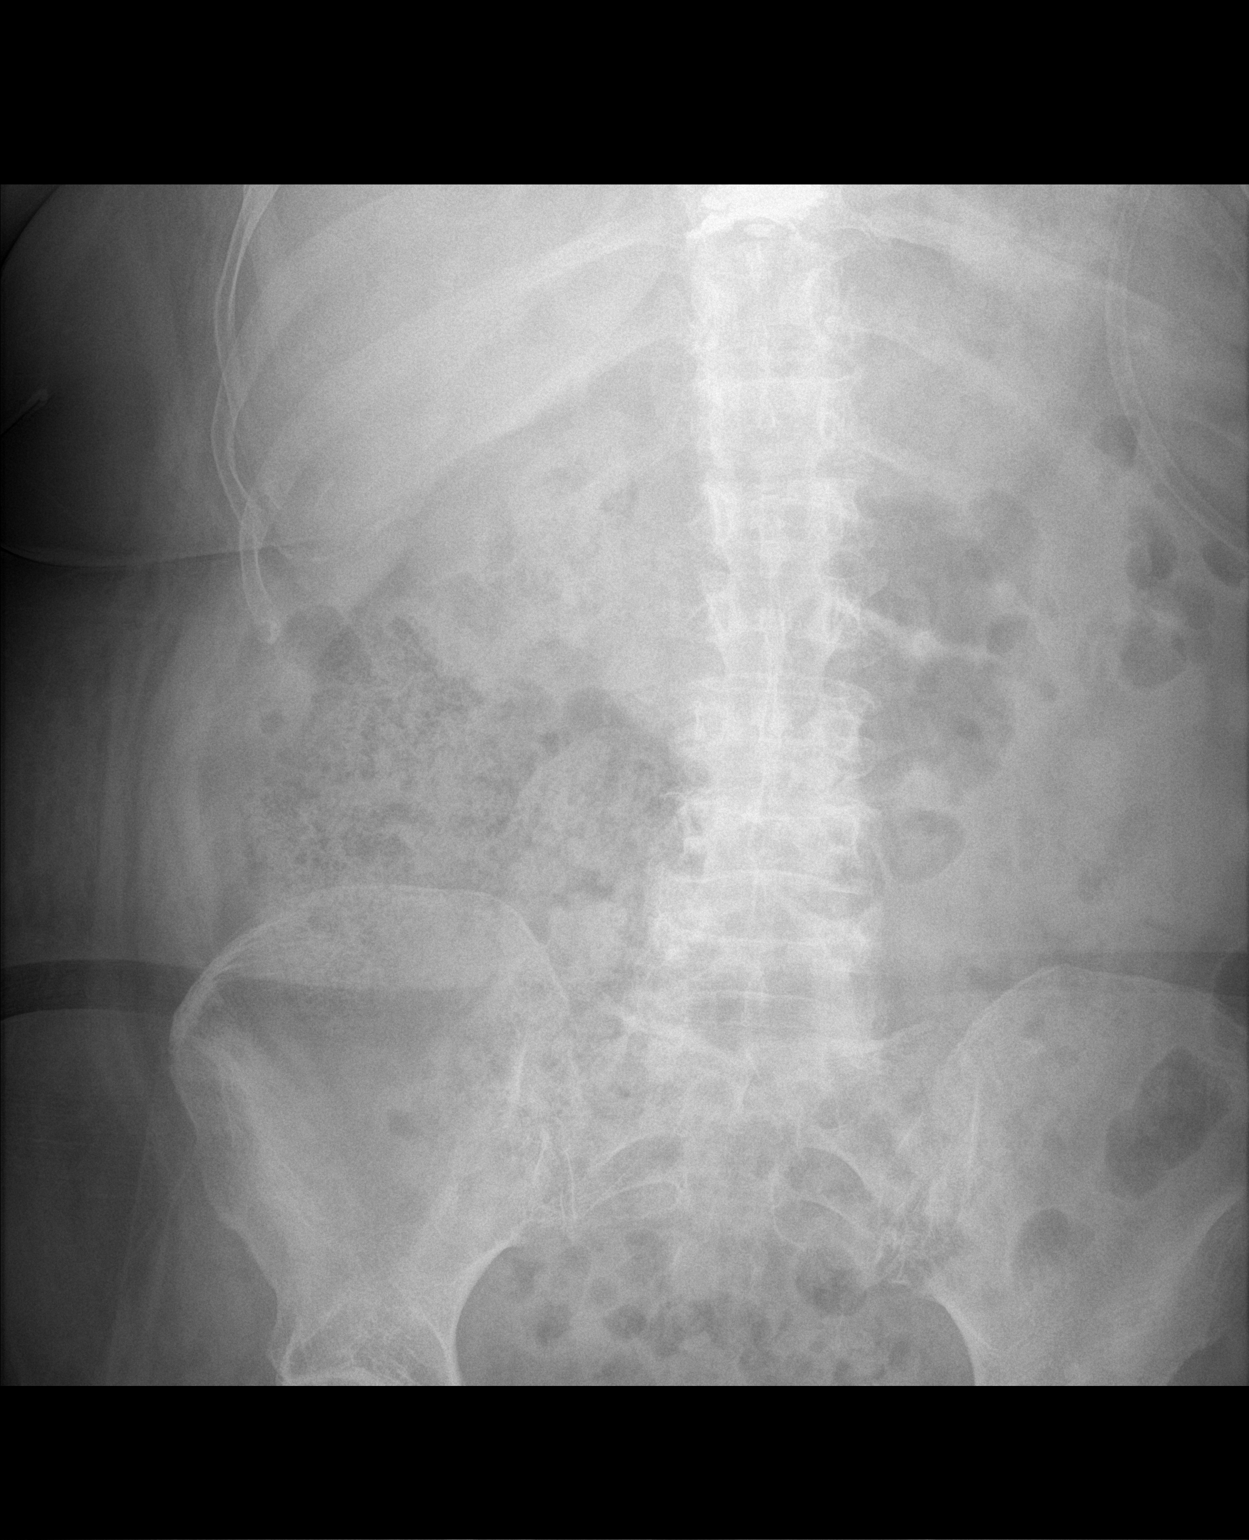

[1 of 1 positions shown; findings below may reference images not displayed]

FINDINGS: Moderate formed stool. No dilated bowel and no concerning mass
effect or calcification. Multiple compression fractures with lower
thoracic vertebroplasty.
IMPRESSION: Moderate stool volume.

Nonobstructive bowel gas pattern.

## 2018-06-11 NOTE — Progress Notes (Signed)
PROGRESS NOTE  Theresa Fernandez OAC:[id number] DOB: 02-12-1956 DOA: 06/06/2018 PCP: Theodis Blaze, MD  HPI/Recap of past 24 hours: Theresa Fernandez is a 62 y/o with COPD, hypertension, depression, anxietyday with who presented to the EDthis afternoon with complaints of shortness of breath, increasing pedal edema, 20 lb weight gain in 6-8 mo, fatigue, wheezing and cough. Stopped smoking 1 wk ago. Nebs did not help at home.  In ED > Respiratory rate was 24-34, blood pressure 110sto 130s over 50s to 70s. Heart rate 99 on arrival, 130safter continuous nebs. Oxygen saturation 92%- 100%on room air. Chest x-ray showed interstitial edema. ABG showed a pH of 7.335, PCO2 of 40.2, PO2 of 76, bicarb of 21.4.  06/10/2018: Patient seen and examined at her bedside.  Reports she still wheezy and still feels short of breath with minimal exertion.  Increase Solu-Medrol to 60 mg every 6 hours and more frequent breathing treatments.  Added scheduled Lasix 40 twice daily.  Repeat home O2 evaluation tomorrow 06/11/2018.  06/11/18: states breathing is improving but still dyspneic w minimal movement. She desaturates to the 80's on ambulation. Continue pulmonary management.   Assessment/Plan: Principal Problem:   COPD with acute exacerbation ([place]) Active Problems:   HTN (hypertension)   Depression   DM2 (diabetes mellitus, type 2) ([place])   Dyslipidemia   Spinal compression fracture (HCC)   Cellulitis of abdominal wall   Acute on chronic heart failure with preserved ejection fraction (HFpEF) (HCC)   CKD (chronic kidney disease) stage 3, GFR 30-59 ml/min (HCC)   Acute exacerbation of COPD Continue doxycycline, 1200 mg twice daily of Mucinex C/w Solu-Medrol 60 mg every 6 hours and IV Lasix 40 mg twice daily Continue DuoNeb every 6 hours and every 2 hours as needed Add pulm toilet w hypersaline nebs and chest PT TID  Acute hypoxic respiratory failure secondary to acute COPD  exacerbation Desaturation in the 80s on ambulation Management as stated above Maintain O2 saturation greater than 90% Repeat home O2 eval tomorrow prior to dc home  Acute on chronic heart failure with preserved ejection fraction Continue Lasix 40 mg twice daily IV Monitor urine output Monitor electrolytes  CKD 3 Baseline GFR 30-59 Appears stable Avoid nephrotoxic agents/dehydration/hypotension  Depression Appears stable Continue home medications  Type 2 diabetes Appears stable Continue insulin sliding scale  Hyperlipidemia Continue statin  History of spinal compression fracture Plan for kyphoplasty outpatient Continue pain medications as needed    Code Status: Full code  Family Communication: None at bedside  Disposition Plan: Possibly tomorrow 06/12/2018   Consultants:  None  Procedures:  None  Antimicrobials:  Doxycycline  DVT prophylaxis: Subcu Lovenox daily   Objective: Vitals:   06/11/18 0154 06/11/18 0545 06/11/18 0825 06/11/18 0900  BP:  132/66    Pulse:  88  (!) 101  Resp:      Temp:  98.6 F (37 C)    TempSrc:  Oral    SpO2: 93% 98% 96% 94%  Weight:  111.5 kg    Height:        Intake/Output Summary (Last 24 hours) at 06/11/2018 1414 Last data filed at 06/11/2018 1408 Gross per 24 hour  Intake 822 ml  Output 4700 ml  Net -3878 ml   Filed Weights   06/09/18 0436 06/10/18 0422 06/11/18 0545  Weight: 118.6 kg 118.5 kg 111.5 kg    Exam:  . General: 62 y.o. year-old female obese using accessory muscles to breathe on room air.  Alert and oriented x3. Marland Kitchen  Cardiovascular: Regular rate and rhythm with no rubs or gallops.  No JVD or thyromegaly noted.   Marland Kitchen Respiratory: Mild wheezes bilaterally with mild rales at bases.  Poor respiratory effort.   . Abdomen: Soft nontender nondistended with normal bowel sounds x4 quadrants. . Musculoskeletal: No lower extremity edema. 2/4 pulses in all 4 extremities. . Skin: No ulcerative lesions  noted or rashes . Psychiatry: Mood is appropriate for condition and setting   Data Reviewed: CBC: Recent Labs  Lab 06/06/18 1458  06/07/18 0511 06/08/18 0431 06/09/18 0339 06/10/18 0252 06/11/18 0428  WBC 9.5  --  12.5* 16.4* 15.0* 13.1* 12.6*  NEUTROABS 6.6  --   --   --   --   --   --   HGB 11.9*   < > 10.6* 10.8* 10.7* 11.5* 11.8*  HCT 39.0   < > 36.2 36.5 35.0* 37.3 39.1  MCV 98.0  --  98.9 98.9 97.2 97.1 95.1  PLT 315  --  335 336 299 327 385   < > = values in this interval not displayed.   Basic Metabolic Panel: Recent Labs  Lab 06/07/18 0511 06/08/18 0431 06/09/18 0339 06/10/18 0252 06/11/18 0428  NA 138 136 135 136 136  K 4.0 4.9 4.7 4.6 3.9  CL 104 105 102 102 97*  CO2 22 23 26 27 30   GLUCOSE 115* 153* 171* 158* 156*  BUN 18 17 17 17  26*  CREATININE 1.36* 1.23* 1.22* 1.18* 1.35*  CALCIUM 9.0 8.9 9.1 9.3 9.3  MG  --  2.2 2.2  --   --    GFR: Estimated Creatinine Clearance: 55.8 mL/min (A) (by C-G formula based on SCr of 1.35 mg/dL (H)). Liver Function Tests: Recent Labs  Lab 06/06/18 1458 06/08/18 0431  AST 21 19  ALT 14 15  ALKPHOS 103 91  BILITOT 0.8 0.5  PROT 7.4 7.1  ALBUMIN 3.7 3.4*   No results for input(s): LIPASE, AMYLASE in the last 168 hours. No results for input(s): AMMONIA in the last 168 hours. Coagulation Profile: Recent Labs  Lab 06/08/18 0431  INR 1.04   Cardiac Enzymes: Recent Labs  Lab 06/06/18 1458  TROPONINI <0.03   BNP (last 3 results) No results for input(s): PROBNP in the last 8760 hours. HbA1C: No results for input(s): HGBA1C in the last 72 hours. CBG: Recent Labs  Lab 06/10/18 1123 06/10/18 1615 06/10/18 2042 06/11/18 0734 06/11/18 1058  GLUCAP 105* 159* 169* 152* 158*   Lipid Profile: No results for input(s): CHOL, HDL, LDLCALC, TRIG, CHOLHDL, LDLDIRECT in the last 72 hours. Thyroid Function Tests: No results for input(s): TSH, T4TOTAL, FREET4, T3FREE, THYROIDAB in the last 72 hours. Anemia  Panel: No results for input(s): VITAMINB12, FOLATE, FERRITIN, TIBC, IRON, RETICCTPCT in the last 72 hours. Urine analysis:    Component Value Date/Time   COLORURINE YELLOW 03/06/2015 [place] 03/06/2015 1205   LABSPEC 1.012 03/06/2015 1205   PHURINE 6.0 03/06/2015 1205   GLUCOSEU NEGATIVE 03/06/2015 1205   HGBUR NEGATIVE 03/06/2015 1205   [place] 03/06/2015 [place] 03/06/2015 1205   PROTEINUR NEGATIVE 03/06/2015 1205   UROBILINOGEN 0.2 03/06/2015 1205   NITRITE NEGATIVE 03/06/2015 1205   LEUKOCYTESUR NEGATIVE 03/06/2015 1205   Sepsis Labs: @LABRCNTIP (procalcitonin:4,lacticidven:4)  ) Recent Results (from the past 240 hour(s))  Blood culture (routine x 2)     Status: None   Collection Time: 06/06/18  3:05 PM  Result Value Ref Range Status  Specimen Description BLOOD RIGHT ANTECUBITAL  Final   Special Requests   Final    BOTTLES DRAWN AEROBIC AND ANAEROBIC Blood Culture adequate volume   Culture   Final    NO GROWTH 5 DAYS Performed at [facility], [address], [place], [place] [address]    Report Status 06/11/2018 FINAL  Final  Blood culture (routine x 2)     Status: None   Collection Time: 06/06/18  3:18 PM  Result Value Ref Range Status   Specimen Description BLOOD RIGHT FOREARM  Final   Special Requests   Final    BOTTLES DRAWN AEROBIC AND ANAEROBIC Blood Culture adequate volume   Culture   Final    NO GROWTH 5 DAYS Performed at [facility], [place] [address] [place], [place] [address]    Report Status 06/11/2018 FINAL  Final  MRSA PCR Screening     Status: None   Collection Time: 06/07/18  6:58 PM  Result Value Ref Range Status   MRSA by PCR NEGATIVE NEGATIVE Final    Comment:        The GeneXpert MRSA Assay (FDA approved for NASAL specimens only), is one component of a comprehensive MRSA colonization surveillance program. It is not intended to diagnose MRSA infection nor to guide or monitor  treatment for MRSA infections. Performed at [facility], [place] [address] [place], [place] [address]       Studies: Dg Abd Portable 1v  Result Date: 06/11/2018 CLINICAL DATA:  Hypoxia EXAM: PORTABLE ABDOMEN - 1 VIEW COMPARISON:  None. FINDINGS: Moderate formed stool. No dilated bowel and no concerning mass effect or calcification. Multiple compression fractures with lower thoracic vertebroplasty. IMPRESSION: Moderate stool volume. Nonobstructive bowel gas pattern. Electronically Signed   By: Monte Fantasia M.D.   On: 06/11/2018 13:03    Scheduled Meds: . amphetamine-dextroamphetamine  20 mg Oral TID  . carisoprodol  350 mg Oral QHS  . dextromethorphan-guaiFENesin  1 tablet Oral BID  . doxycycline  100 mg Oral Q12H  . enoxaparin (LOVENOX) injection  40 mg Subcutaneous Q24H  . famotidine  20 mg Oral BID  . furosemide  40 mg Intravenous BID  . ipratropium-albuterol  3 mL Nebulization Q6H  . lamoTRIgine  25 mg Oral Daily  . magnesium gluconate  500 mg Oral BID  . methylPREDNISolone (SOLU-MEDROL) injection  60 mg Intravenous Q6H  . pravastatin  20 mg Oral q1800  . pregabalin  100 mg Oral BID  . senna-docusate  1 tablet Oral BID  . sertraline  100 mg Oral BID  . sodium chloride flush  3 mL Intravenous Q12H    Continuous Infusions: . sodium chloride Stopped (06/06/18 2052)  . sodium chloride       LOS: 5 days     Kayleen Memos, MD [facility] Pager [phone]  If 7PM-7AM, please contact night-coverage [email or web address] Password [facility] 06/11/2018, 2:14 PM

## 2018-06-11 NOTE — Progress Notes (Signed)
SATURATION QUALIFICATIONS: (This note is used to comply with regulatory documentation for home oxygen)  Patient Saturations on Room Air at Rest = 90%  Patient Saturations on Room Air while Ambulating = 85%  Patient Saturations on 3 Liters of oxygen while Ambulating = 94%  Please briefly explain why patient needs home oxygen: Unable to maintain O2 Saturation above 95% on room air.

## 2018-06-11 NOTE — Progress Notes (Signed)
Provider notified about CBG consistently over 140. Will continue to monitor.

## 2018-06-12 ENCOUNTER — Inpatient Hospital Stay (HOSPITAL_COMMUNITY)

## 2018-06-12 LAB — BASIC METABOLIC PANEL
Anion gap: 12 (ref 5–15)
BUN: 35 mg/dL — AB (ref 8–23)
CALCIUM: 9.2 mg/dL (ref 8.9–10.3)
CHLORIDE: 96 mmol/L — AB (ref 98–111)
CO2: 29 mmol/L (ref 22–32)
CREATININE: 1.26 mg/dL — AB (ref 0.44–1.00)
GFR calc Af Amer: 51 mL/min — ABNORMAL LOW (ref >60)
GFR, EST NON AFRICAN AMERICAN: 44 mL/min — AB (ref >60)
Glucose, Bld: 164 mg/dL — ABNORMAL HIGH (ref 70–99)
Potassium: 3.5 mmol/L (ref 3.5–5.1)
SODIUM: 137 mmol/L (ref 135–145)

## 2018-06-12 LAB — CBC
HCT: 41.9 % (ref 36.0–46.0)
Hemoglobin: 12.7 g/dL (ref 12.0–15.0)
MCH: 28.9 pg (ref 26.0–34.0)
MCHC: 30.3 g/dL (ref 30.0–36.0)
MCV: 95.2 fL (ref 80.0–100.0)
PLATELETS: 407 10*3/uL — AB (ref 150–400)
RBC: 4.4 MIL/uL (ref 3.87–5.11)
RDW: 13.6 % (ref 11.5–15.5)
WBC: 13.7 10*3/uL — AB (ref 4.0–10.5)
nRBC: 0 % (ref 0.0–0.2)

## 2018-06-12 LAB — GLUCOSE, CAPILLARY
Glucose-Capillary: 184 mg/dL — ABNORMAL HIGH (ref 70–99)
Glucose-Capillary: 153 mg/dL — ABNORMAL HIGH (ref 70–99)

## 2018-06-12 IMAGING — DX DG CHEST 1V PORT
1 series · 1 of 1 positions shown · non-contrast
Comparison: [DATE]

CLINICAL DATA: Hypoxia, COPD

EXAM:
PORTABLE CHEST 1 VIEW

[chest]
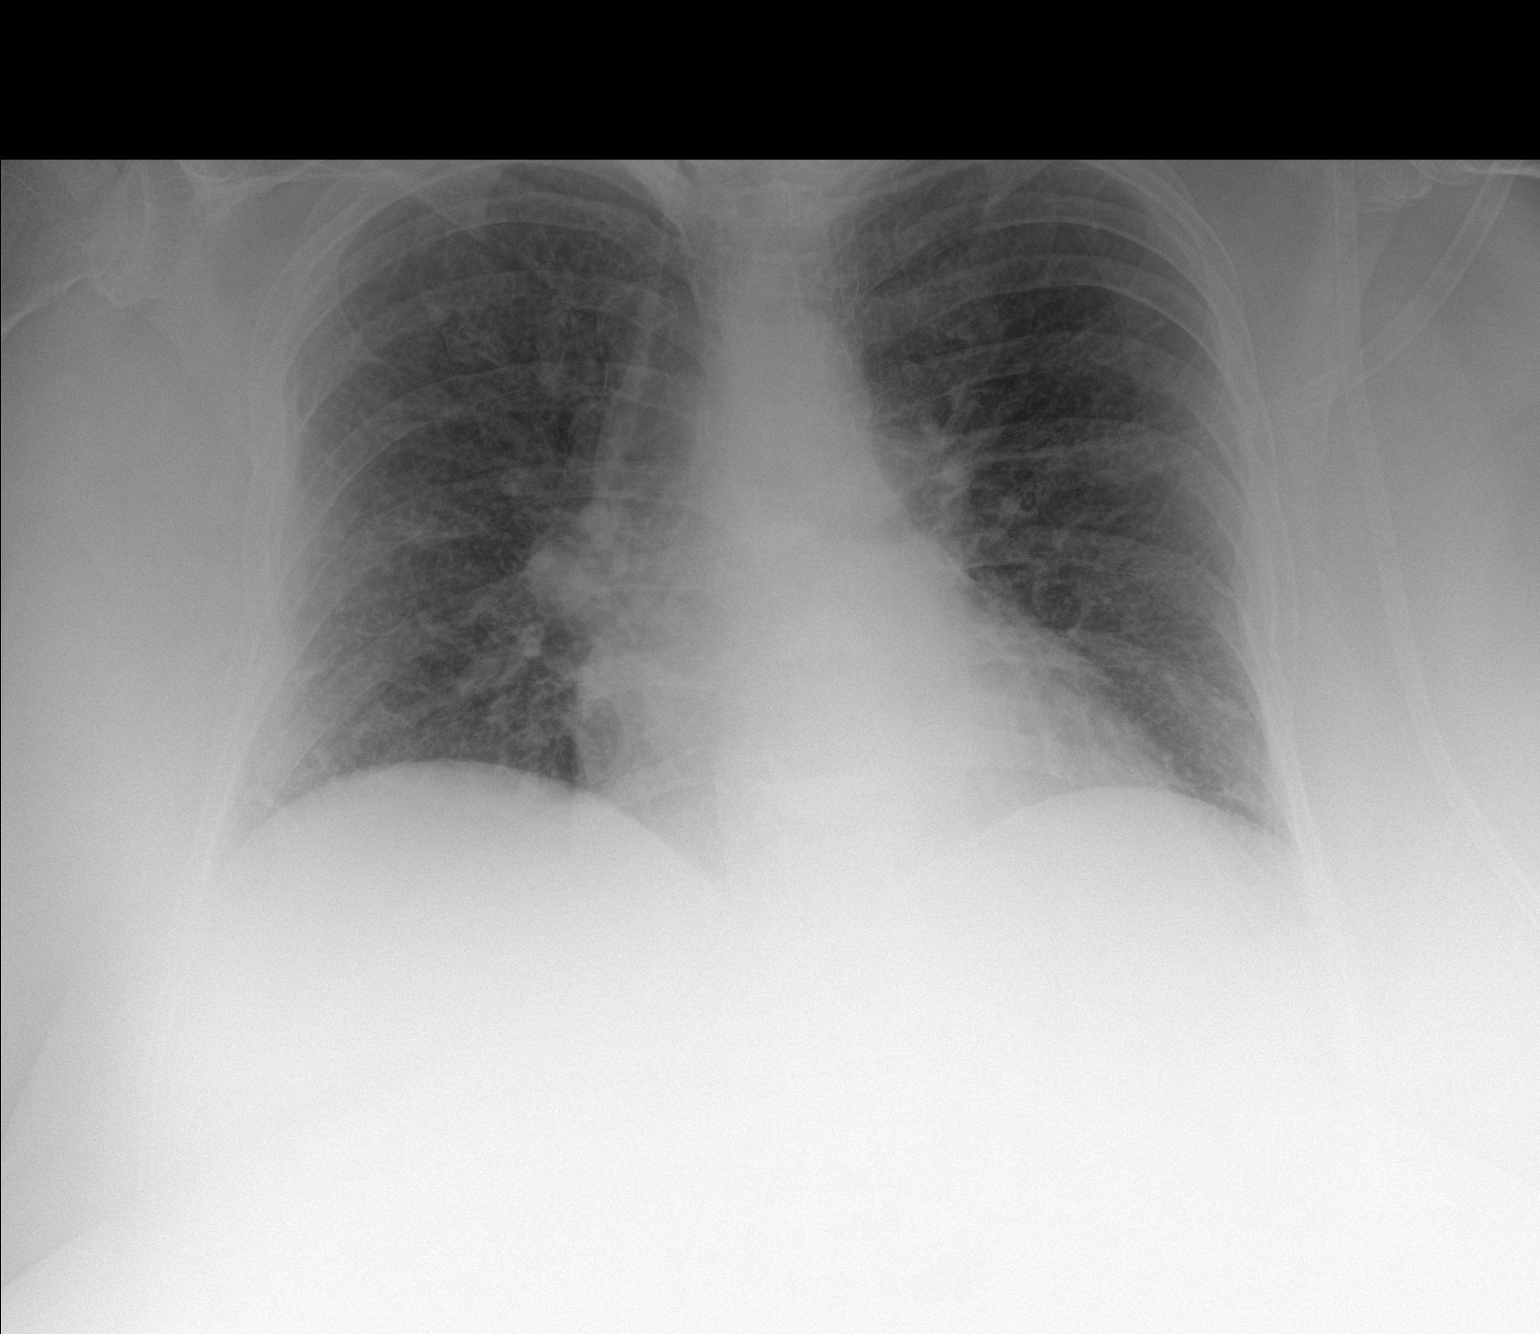

[1 of 1 positions shown; findings below may reference images not displayed]

FINDINGS: Interstitial prominence throughout the lungs, improved since prior
study. Heart is borderline in size. Low lung volumes. No confluent
opacities or effusions. No acute bony abnormality.
IMPRESSION: Interstitial prominence throughout the lungs, improved since prior
study, likely improving edema. Findings could also reflect
underlying chronic interstitial lung disease.

## 2018-06-12 MED ORDER — METHYLPREDNISOLONE 4 MG PO TBPK: 4.0000 mg | ORAL_TABLET | Freq: Four times a day (QID) | ORAL | Status: DC

## 2018-06-12 MED ORDER — FUROSEMIDE 40 MG PO TABS: 40.0000 mg | ORAL_TABLET | Freq: Every day | ORAL | 0 refills | Status: DC

## 2018-06-12 MED ORDER — POTASSIUM CHLORIDE CRYS ER 20 MEQ PO TBCR: 20.0000 meq | EXTENDED_RELEASE_TABLET | Freq: Every day | ORAL | Status: DC

## 2018-06-12 MED ORDER — FUROSEMIDE 40 MG PO TABS: 40.0000 mg | ORAL_TABLET | Freq: Every day | ORAL | Status: DC

## 2018-06-12 MED ORDER — PRAVASTATIN SODIUM 20 MG PO TABS: 20.0000 mg | ORAL_TABLET | Freq: Every day | ORAL | 0 refills | Status: DC

## 2018-06-12 MED ORDER — SENNOSIDES-DOCUSATE SODIUM 8.6-50 MG PO TABS: 1.0000 | ORAL_TABLET | Freq: Two times a day (BID) | ORAL | 0 refills | Status: DC

## 2018-06-12 MED ORDER — POTASSIUM CHLORIDE CRYS ER 20 MEQ PO TBCR
40.0000 meq | EXTENDED_RELEASE_TABLET | Freq: Once | ORAL | Status: AC
  Administered 2018-06-12: 40 meq via ORAL
  Filled 2018-06-12: qty 2

## 2018-06-12 MED ORDER — METHYLPREDNISOLONE 4 MG PO TBPK: ORAL_TABLET | ORAL | 0 refills | Status: DC

## 2018-06-12 MED ORDER — FAMOTIDINE 20 MG PO TABS: 20.0000 mg | ORAL_TABLET | Freq: Two times a day (BID) | ORAL | 0 refills | Status: DC

## 2018-06-12 MED ORDER — NICOTINE 21 MG/24HR TD PT24: 21.0000 mg | MEDICATED_PATCH | TRANSDERMAL | 0 refills | Status: DC

## 2018-06-12 MED ORDER — POTASSIUM CHLORIDE CRYS ER 20 MEQ PO TBCR: 40.0000 meq | EXTENDED_RELEASE_TABLET | Freq: Once | ORAL | Status: AC

## 2018-06-12 MED ORDER — POTASSIUM CHLORIDE CRYS ER 20 MEQ PO TBCR: 20.0000 meq | EXTENDED_RELEASE_TABLET | Freq: Every day | ORAL | 0 refills | Status: DC

## 2018-06-12 NOTE — Discharge Instructions (Signed)
Chronic Obstructive Pulmonary Disease Exacerbation Chronic obstructive pulmonary disease (COPD) is a common lung problem. In COPD, the flow of air from the lungs is limited. COPD exacerbations are times that breathing gets worse and you need extra treatment. Without treatment they can be life threatening. If they happen often, your lungs can become more damaged. If your COPD gets worse, your doctor may treat you with:  Medicines.  Oxygen.  Different ways to clear your airway, such as using a mask.  Follow these instructions at home:  Do not smoke.  Avoid tobacco smoke and other things that bother your lungs.  If given, take your antibiotic medicine as told. Finish the medicine even if you start to feel better.  Only take medicines as told by your doctor.  Drink enough fluids to keep your pee (urine) clear or pale yellow (unless your doctor has told you not to).  Use a cool mist machine (vaporizer).  If you use oxygen or a machine that turns liquid medicine into a mist (nebulizer), continue to use them as told.  Keep up with shots (vaccinations) as told by your doctor.  Exercise regularly.  Eat healthy foods.  Keep all doctor visits as told. Get help right away if:  You are very short of breath and it gets worse.  You have trouble talking.  You have bad chest pain.  You have blood in your spit (sputum).  You have a fever.  You keep throwing up (vomiting).  You feel weak, or you pass out (faint).  You feel confused.  You keep getting worse. This information is not intended to replace advice given to you by your health care provider. Make sure you discuss any questions you have with your health care provider. Document Released: 06/27/2011 Document Revised: 12/14/2015 Document Reviewed: 03/12/2013 Elsevier Interactive Patient Education  2017 [place].   Heart Failure Action Plan A heart failure action plan helps you understand what to do when you have  symptoms of heart failure. Follow the plan that was created by you and your health care provider. Review your plan each time you visit your health care provider. Red zone These signs and symptoms mean you should get medical help right away:  You have trouble breathing when resting.  You have a dry cough that is getting worse.  You have swelling or pain in your legs or abdomen that is getting worse.  You suddenly gain more than 2-3 lb (0.9-1.4 kg) in a day, or more than 5 lb (2.3 kg) in one week. This amount may be more or less depending on your condition.  You have trouble staying awake or you feel confused.  You have chest pain.  You do not have an appetite.  You pass out.  If you experience any of these symptoms:  Call your local emergency services (911 in the [place] right away or seek help at the emergency department of the nearest hospital.  Yellow zone These signs and symptoms mean your condition may be getting worse and you should make some changes:  You have trouble breathing when you are active or you need to sleep with extra pillows.  You have swelling in your legs or abdomen.  You gain 2-3 lb (0.9-1.4 kg) in one day, or 5 lb (2.3 kg) in one week. This amount may be more or less depending on your condition.  You get tired easily.  You have trouble sleeping.  You have a dry cough.  If you experience any of  these symptoms:  Contact your health care provider within the next day.  Your health care provider may adjust your medicines.  Green zone These signs mean you are doing well and can continue what you are doing:  You do not have shortness of breath.  You have very little swelling or no new swelling.  Your weight is stable (no gain or loss).  You have a normal activity level.  You do not have chest pain or any other new symptoms.  Follow these instructions at home:  Take over-the-counter and prescription medicines only as told by your health care  provider.  Weigh yourself daily. Your target weight is __________ lb (__________ kg). ? Call your health care provider if you gain more than __________ lb (__________ kg) in a day, or more than __________ lb (__________ kg) in one week.  Eat a heart-healthy diet. Work with a diet and nutrition specialist (dietitian) to create an eating plan that is best for you.  Keep all follow-up visits as told by your health care provider. This is important. Where to find more information:  American Heart Association: www.heart.org Summary  Follow the action plan that was created by you and your health care provider.  Get help right away if you have any symptoms in the Red zone. This information is not intended to replace advice given to you by your health care provider. Make sure you discuss any questions you have with your health care provider. Document Released: 08/17/2016 Document Revised: 08/17/2016 Document Reviewed: 08/17/2016 Elsevier Interactive Patient Education  2018 [place].   Heart Failure Eating Plan Heart failure, also called congestive heart failure, occurs when your heart does not pump blood well enough to meet your body's needs for oxygen-rich blood. Heart failure is a long-term (chronic) condition. Living with heart failure can be challenging. However, following your health care provider's instructions about a healthy lifestyle and working with a diet and nutrition specialist (dietitian) to choose the right foods may help to improve your symptoms. What are tips for following this plan? General guidelines  Do not eat more than 2,300 mg of salt (sodium) a day. The amount of sodium that is recommended for you may be lower, depending on your condition.  Maintain a healthy body weight as directed. Ask your health care provider what a healthy weight is for you. ? Check your weight every day. ? Work with your health care provider and dietitian to make a plan that is right for you to  lose weight or maintain your current weight.  Limit how much fluid you drink. Ask your health care provider or dietitian how much fluid you can have each day.  Limit or avoid alcohol as told by your health care provider or dietitian. Reading food labels  Check food labels for the amount of sodium per serving. Choose foods that have less than 140 mg (milligrams) of sodium in each serving.  Check food labels for the number of calories per serving. This is important if you need to limit your daily calorie intake to lose weight.  Check food labels for the serving size. If you eat more than one serving, you will be eating more sodium and calories than what is listed on the label.  Look for foods that are labeled as "sodium-free," "very low sodium," or "low sodium." ? Foods labeled as "reduced sodium" or "lightly salted" may still have more sodium than what is recommended for you. Cooking  Avoid adding salt when cooking. Ask your  health care provider or dietitian before using salt substitutes.  Season food with salt-free seasonings, spices, or herbs. Check the label of seasoning mixes to make sure they do not contain salt.  Cook with heart-healthy oils, such as olive, canola, soybean, or sunflower oil.  Do not fry foods. Cook foods using low-fat methods, such as baking, boiling, grilling, and broiling.  Limit unhealthy fats when cooking by: ? Removing the skin from poultry, such as chicken. ? Removing all visible fats from meats. ? Skimming the fat off from stews, soups, and gravies before serving them. Meal planning  Limit your intake of: ? Processed, canned, or pre-packaged foods. ? Foods that are high in trans fat, such as fried foods. ? Sweets, desserts, sugary drinks, and other foods with added sugar. ? Full-fat dairy products, such as whole milk.  Eat a balanced diet that includes: ? 4-5 servings of fruit each day and 4-5 servings of vegetables each day. At each meal, try to  fill half of your plate with fruits and vegetables. ? Up to 6-8 servings of whole grains each day. ? Up to 2 servings of lean meat, poultry, or fish each day. One serving of meat is equal to 3 oz. This is about the same size as a deck of cards. ? 2 servings of low-fat dairy each day. ? Heart-healthy fats. Healthy fats called omega-3 fatty acids are found in foods such as flaxseed and cold-water fish like sardines, salmon, and mackerel.  Aim to eat 25-35 g (grams) of fiber a day. Foods that are high in fiber include apples, broccoli, carrots, beans, peas, and whole grains.  Do not add salt or condiments that contain salt (such as soy sauce) to foods before eating.  When eating at a restaurant, ask that your food be prepared with less salt or no salt, if possible.  Try to eat 2 or more vegetarian meals each week.  Eat more home-cooked food and eat less restaurant, buffet, and fast food. Recommended foods The items listed may not be a complete list. Talk with your dietitian about what dietary choices are best for you. Grains Bread with less than 80 mg of sodium per slice. Whole-wheat pasta, quinoa, and brown rice. Oats and oatmeal. Barley. [place]. Grits and cream of wheat. Whole-grain and whole-wheat cold cereal. Vegetables All fresh vegetables. Vegetables that are frozen without sauce or added salt. Low-sodium or sodium-free canned vegetables. Fruits All fresh, frozen, and canned fruits. Dried fruits, such as raisins, prunes, and cranberries. Meats and other protein foods Lean cuts of meat. Skinless chicken and [place]. Fish with high omega-3 fatty acids, such as salmon, sardines, and other cold-water fishes. Eggs. Dried beans, peas, and edamame. Unsalted nuts and nut butters. Dairy Low-fat or nonfat (skim) milk and dried milk. Rice milk, soy milk, and almond milk. Low-fat or nonfat yogurt. Small amounts of reduced-sodium block cheese. Low-sodium cottage cheese. Fats and oils Olive, canola,  soybean, flaxseed, or sunflower oil. Avocado. Sweets and desserts Apple sauce. Granola bars. Sugar-free pudding and gelatin. Frozen fruit bars. Seasoning and other foods Fresh and dried herbs. Lemon or lime juice. Vinegar. Low-sodium ketchup. Salt-free marinades, salad dressings, sauces, and seasonings. Foods to avoid The items listed may not be a complete list. Talk with your dietitian about what dietary choices are best for you. Grains Bread with more than 80 mg of sodium per slice. Hot or cold cereal with more than 140 mg sodium per serving. Salted pretzels and crackers. Pre-packaged breadcrumbs. Bagels, croissants, and  biscuits. Vegetables Canned vegetables. Frozen vegetables with sauce or seasonings. Creamed vegetables. [place] fries. Onion rings. Pickled vegetables and sauerkraut. Fruits Fruits that are dried with sodium-containing preservatives. Meats and other protein foods Ribs and chicken wings. Bacon, ham, pepperoni, bologna, salami, and packaged luncheon meats. Hot dogs, bratwurst, and sausage. Canned meat. Smoked meat and fish. Salted nuts and seeds. Dairy Whole milk, half-and-half, and cream. Buttermilk. Processed cheese, cheese spreads, and cheese curds. Regular cottage cheese. Feta cheese. Shredded cheese. String cheese. Fats and oils Butter, lard, shortening, ghee, and bacon fat. Canned and packaged gravies. Seasoning and other foods Onion salt, garlic salt, table salt, and sea salt. Marinades. Regular salad dressings. Relishes, pickles, and olives. Meat flavorings and tenderizers, and bouillon cubes. Horseradish, ketchup, and mustard. Worcestershire sauce. Teriyaki sauce, soy sauce (including reduced sodium). Hot sauce and Tabasco sauce. Steak sauce, fish sauce, oyster sauce, and cocktail sauce. Taco seasonings. Barbecue sauce. Tartar sauce. Summary  A heart failure eating plan includes changes that limit your intake of sodium and unhealthy fat, and it may help you lose weight  or maintain a healthy weight. Your health care provider may also recommend limiting how much fluid you drink.  Most people with heart failure should eat no more than 2,300 mg of salt (sodium) a day. The amount of sodium that is recommended for you may be lower, depending on your condition.  Contact your health care provider or dietitian before making any major changes to your diet. This information is not intended to replace advice given to you by your health care provider. Make sure you discuss any questions you have with your health care provider. Document Released: 11/22/2016 Document Revised: 11/22/2016 Document Reviewed: 11/22/2016 Elsevier Interactive Patient Education  2018 [place].   Heart Failure Medicines Heart failure is a condition in which the heart cannot pump enough blood through the body. This can cause symptoms such as shortness of breath, fatigue, and confusion. There are two types of heart failure:  Heart failure with reduced ejection fraction. In this type, the heart muscle is weak.  Heart failure with preserved ejection fraction. In this type, the heart muscle does not fill with blood properly and may be stiff.  There is no cure for heart failure. However, being treated with medicines and following your health care provider's instructions about a healthy lifestyle can help you stay active, avoid problems, and live longer. Talk to your health care provider about all medicines that you are taking, how often you should take them, and what possible problems (side effects) they may cause. Talk with your health care provider if you have difficulty affording your medicines. What are some common medicines for heart failure? The medicines that are prescribed for you will depend on your symptoms, the type of heart failure you have, and the cause of your heart failure. In some cases, you may need to take more than one medicine. You will be prescribed the following medicines  according to your type of heart failure: Heart failure with reduced ejection fraction  Beta-blockers.  Angiotensin-converting enzyme (ACE) inhibitors.  Angiotensin II receptor blockers (ARBs).  Angiotensin receptor neprilysin inhibitors (ARNIs).  Aldosterone antagonists.  Diuretics.  Digoxin.  Nitrates. Heart failure with preserved ejection fraction  Medicines to control blood pressure, including: ? Beta-blockers. ? Angiotensin-converting enzyme (ACE) inhibitors. ? Angiotensin II receptor blockers (ARBs).  Diuretics.  Aldosterone antagonists. What should I know about beta-blockers?  These medicines lower your blood pressure and slow your heart rate. This helps to lessen  your heart's workload.  They can help to relieve chest pain (angina).  They can help to improve your heart's ability to pump.  They may cause asthma attacks and shortness of breath.  Because these medicines slow your heart rate, it is important not to overwork yourself while exercising. Talk to your health care provider about what your target heart rate should be while you exercise.  These medicines can hide the symptoms of low blood sugar (glucose), which is also called hypoglycemia. If you have diabetes, make sure to check your blood glucose carefully. If you have hypoglycemia, talk to your health care provider about adjusting your medicines.  Beta-blockers may make you feel dizzy or light-headed at first. Do not drive or use heavy machinery when you first start these medicines. Ask your health care provider when it is safe for you to drive. What should I know about ACE inhibitors or ARBs?  These medicines help to widen arteries and veins. This action lowers your blood pressure and lessens the strain on your heart, making it easier for your heart to pump.  They can help to lessen the symptoms of heart failure.  ARBs are often used if a person cannot take ACE inhibitors.  ACE inhibitors may cause a  dry cough.  In rare cases, ACE inhibitors and ARBs can cause swelling of the tongue or lips, other swelling, taste problems, and skin rashes. If these symptoms occur, stop taking the medicines and contact your health care provider.  Do not take ACE inhibitors if you are pregnant or may become pregnant. These medicines can cause health problems in an unborn baby.  These medicines may cause dizziness. You may need regular checkups and blood tests to monitor how they are working. What should I know about ARNIs?  These medicines are a combination of an ARB and another medicine. They lower your blood pressure.  Side effects may include dry cough, dizziness, low blood pressure, and kidney problems.  Do not take ARNIs if you are already taking ACE inhibitors or ARBs.  You may notice increased urination when taking these medicines.  ARNIs can raise the amount of potassium in the blood. Your potassium levels will be monitored regularly by your health care provider. What should I know about aldosterone antagonists?  They help the body to remove excess sodium through urination. This helps to lessen the amount of blood that the heart needs to pump.  They can also help to lower blood pressure and improve the heart's ability to pump blood.  They may cause dizziness, diarrhea, coughing, or flu-like symptoms.  They should not be used if you have type 2 diabetes.  They can raise the amount of potassium in the blood. Your potassium levels will be monitored regularly by your health care provider.  These medicines can make men's breasts large and tender. What should I know about diuretics?  Diuretics are medicines that help the body get rid of excess fluid through urination. They can also help lessen your heart's workload.  They help to lessen fluid buildup in the lungs, ankles, and feet.  They help to lower your blood pressure.  They can worsen problems with controlling urination (urinary  incontinence).  They may cause dizziness, headaches, muscle cramps, and an upset stomach.  They can cause weak muscles, dry mouth, or confusion. It is important to drink plenty of fluids while taking these medicines, especially while exercising or on hot days. What should I know about digoxin?  Digoxin helps the heart  pump more blood efficiently. It also lowers your heart rate.  It can help ease heart failure symptoms and may be used if other medicines do not work.  It can also help with irregular heartbeat (arrhythmia).  It may cause stomach problems, fatigue, headache, drowsiness, or vision problems. What should I know about nitrates?  Nitrates relax the blood vessels and increase oxygen and blood supply to the heart. They also lower the blood pressure.  They are usually taken to lessen chest pain.  They may cause headaches, flushing, or irregular heartbeat. Summary  A healthy lifestyle and treatment with medicine will relieve symptoms of heart failure.  In some cases, you may need to take more than one medicine.  It is important to talk to your health care provider about how often you should take your medicines. Do not skip a dose or change your dosage.  Talk to your heaLth care provider about possible side effects of these medicines. This information is not intended to replace advice given to you by your health care provider. Make sure you discuss any questions you have with your health care provider. Document Released: 11/22/2016 Document Revised: 11/22/2016 Document Reviewed: 11/22/2016 Elsevier Interactive Patient Education  2018 [place].   Heart Failure and Exercise Heart failure is a condition in which the heart does not fill or pump enough blood and oxygen to support your body and its functions. Heart failure is a long-term (chronic) condition. Living with heart failure can be challenging. However, following your health care provider's instructions about a healthy  lifestyle may help improve your symptoms. This includes choosing the right exercise plan. Doing daily physical activity is important after a diagnosis of heart failure. You may have some activity restrictions, so talk to your health care provider before doing any exercises. What are the benefits of exercise? Exercise may:  Make your heart muscles stronger.  Lower your blood pressure.  Lower your cholesterol.  Help you lose weight.  Help your bones stay strong.  Improve your blood circulation.  Help your body use oxygen better. This relieves symptoms such as fatigue and shortness of breath.  Help your mental health by lowering the risk of depression and other problems.  Improve your quality of life.  Decrease your chance of hospital admission for heart failure.  What is an exercise plan? An exercise plan is a set of specific exercises and training activities. You will work with your health care provider to create the exercise plan that works for you. The plan may include:  Different types of exercises and how to do them.  Cardiac rehabilitation exercises. These are supervised programs that are designed to strengthen your heart.  What are strengthening exercises? Strengthening exercises are a type of physical activity that involves using resistance to improve your muscle strength. Strengthening exercises usually have repetitive motions. These types of exercises can include:  Lifting weights.  Using weight machines.  Using resistance tubes and bands.  Using kettlebells.  Using your body weight, such as doing push-ups or squats.  What are balance exercises? Balance exercises are another type of physical activity. They strengthen the muscles of the back, stomach, and pelvis (core muscles) and improve your balance. They can also lower your risk of falling. These types of exercises can include:  Standing on one leg.  Walking backward, sideways, and in a straight  line.  Standing up after sitting, without using your hands.  Shifting your weight from one leg to the other.  Lifting one leg  in front of you.  Doing tai chi. This is a type of exercise that uses slow movements and deep breathing.  How can I increase my flexibility? Having better flexibility can keep you from falling. It can also lengthen your muscles, improve your range of motion, and help your joints. You can increase your flexibility by:  Doing tai chi.  Doing yoga.  Stretching.  How much aerobic exercise should I get? Aerobic exercises strengthen your breathing and circulation system and increase your body's use of oxygen. Examples of aerobic exercise include biking, walking, running, and swimming. Talk to your health care provider to find out how much aerobic exercise is safe for you.  To do these exercises:  Start exercising slowly, limiting the amount of time at first. You may need to start with 5 minutes of aerobic exercise every day.  Slowly add more minutes until you can safely do at least 30 minutes of exercise at least 4 days a week.  Summary  Daily physical activity is important after a diagnosis of heart failure.  Exercise can make your heart muscles stronger. It also offers other benefits that will improve your health.  Talk to your health care provider before doing any exercises. This information is not intended to replace advice given to you by your health care provider. Make sure you discuss any questions you have with your health care provider. Document Released: 11/19/2016 Document Revised: 11/19/2016 Document Reviewed: 11/19/2016 Elsevier Interactive Patient Education  2018 [place].   Heart Failure Heart failure means your heart has trouble pumping blood. This makes it hard for your body to work well. Heart failure is usually a long-term (chronic) condition. You must take good care of yourself and follow your doctor's treatment plan. Follow these  instructions at home:  Take your heart medicine as told by your doctor. ? Do not stop taking medicine unless your doctor tells you to. ? Do not skip any dose of medicine. ? Refill your medicines before they run out. ? Take other medicines only as told by your doctor or pharmacist.  Stay active if told by your doctor. The elderly and people with severe heart failure should talk with a doctor about physical activity.  Eat heart-healthy foods. Choose foods that are without trans fat and are low in saturated fat, cholesterol, and salt (sodium). This includes fresh or frozen fruits and vegetables, fish, lean meats, fat-free or low-fat dairy foods, whole grains, and high-fiber foods. Lentils and dried peas and beans (legumes) are also good choices.  Limit salt if told by your doctor.  Cook in a healthy way. Roast, grill, broil, bake, poach, steam, or stir-fry foods.  Limit fluids as told by your doctor.  Weigh yourself every morning. Do this after you pee (urinate) and before you eat breakfast. Write down your weight to give to your doctor.  Take your blood pressure and write it down if your doctor tells you to.  Ask your doctor how to check your pulse. Check your pulse as told.  Lose weight if told by your doctor.  Stop smoking or chewing tobacco. Do not use gum or patches that help you quit without your doctor's approval.  Schedule and go to doctor visits as told.  Nonpregnant women should have no more than 1 drink a day. Men should have no more than 2 drinks a day. Talk to your doctor about drinking alcohol.  Stop illegal drug use.  Stay current with shots (immunizations).  Manage your health conditions  as told by your doctor.  Learn to manage your stress.  Rest when you are tired.  If it is really hot outside: ? Avoid intense activities. ? Use air conditioning or fans, or get in a cooler place. ? Avoid caffeine and alcohol. ? Wear loose-fitting, lightweight, and  light-colored clothing.  If it is really cold outside: ? Avoid intense activities. ? Layer your clothing. ? Wear mittens or gloves, a hat, and a scarf when going outside. ? Avoid alcohol.  Learn about heart failure and get support as needed.  Get help to maintain or improve your quality of life and your ability to care for yourself as needed. Contact a doctor if:  You gain weight quickly.  You are more short of breath than usual.  You cannot do your normal activities.  You tire easily.  You cough more than normal, especially with activity.  You have any or more puffiness (swelling) in areas such as your hands, feet, ankles, or belly (abdomen).  You cannot sleep because it is hard to breathe.  You feel like your heart is beating fast (palpitations).  You get dizzy or light-headed when you stand up. Get help right away if:  You have trouble breathing.  There is a change in mental status, such as becoming less alert or not being able to focus.  You have chest pain or discomfort.  You faint. This information is not intended to replace advice given to you by your health care provider. Make sure you discuss any questions you have with your health care provider. Document Released: 04/16/2008 Document Revised: 12/14/2015 Document Reviewed: 08/24/2012 Elsevier Interactive Patient Education  2017 [employer].

## 2018-06-12 NOTE — Progress Notes (Signed)
Physical Therapy Treatment Patient Details Name: Theresa Fernandez MRN: [id number] DOB: 05/18/1956 Today's Date: 06/12/2018    History of Present Illness Pt is a 62 y.o. female admitted 06/06/18 with SOB, fatigue and cough. Worked up for CHF and COPD exacerbation. PMH includes COPD, HTN, depression, anxiety, chronic compression fxs (per pt, potential for sx with Dumonski).   PT Comments    Pt progressing well with mobility. Mod indep with transfers and ambulation using RW. Remains limited by decreased activity tolerance and back pain. SpO2 94% on 3L O2 [place] with ambulation; 93% on 2L O2 [place] at rest. Pt planning to discharge home this afternoon. Continue to recommend HHPT services.     Follow Up Recommendations  Home health PT;Supervision for mobility/OOB     Equipment Recommendations  None recommended by PT    Recommendations for Other Services       Precautions / Restrictions Precautions Precautions: Back Precaution Comments: chronic spinal compression fxs (pt verbalized pending sx with Dumonski); reviewed back precautions  Restrictions Weight Bearing Restrictions: No    Mobility  Bed Mobility Overal bed mobility: Modified Independent Bed Mobility: Supine to Sit;Sit to Supine     Supine to sit: Modified independent (Device/Increase time)     General bed mobility comments: HOB elevated  Transfers Overall transfer level: Independent Equipment used: None Transfers: Sit to/from Stand Sit to Stand: Independent            Ambulation/Gait Ambulation/Gait assistance: Modified independent (Device/Increase time) Gait Distance (Feet): 250 Feet Assistive device: Rolling walker (2 wheeled) Gait Pattern/deviations: Step-through pattern;Decreased stride length;Trunk flexed Gait velocity: Decreased Gait velocity interpretation: 1.31 - 2.62 ft/sec, indicative of limited community ambulator General Gait Details: Mod indep with amb; 1x standing rest break with DOE 2/4. SpO2 94% on  3L O2 [place]   Stairs Stairs: (Pt able to state correct technique for ascending/descending 1 step into home with RW)           Wheelchair Mobility    Modified Rankin (Stroke Patients Only)       Balance Overall balance assessment: Needs assistance   Sitting balance-Leahy Scale: Good       Standing balance-Leahy Scale: Fair Standing balance comment: Can static stand and take steps without DME; stability improved with UE support                            Cognition Arousal/Alertness: Awake/alert Behavior During Therapy: WFL for tasks assessed/performed Overall Cognitive Status: Within Functional Limits for tasks assessed                                        Exercises      General Comments        Pertinent Vitals/Pain Pain Assessment: Faces Faces Pain Scale: Hurts a little bit Pain Location: Back Pain Descriptors / Indicators: Sore;Constant Pain Intervention(s): Monitored during session    Home Living                      Prior Function            PT Goals (current goals can now be found in the care plan section) Acute Rehab PT Goals Patient Stated Goal: to discharge so i can go see my mom on the 4th floor PT Goal Formulation: With patient Time For Goal Achievement: 06/21/18 Potential to Achieve Goals:  Good Progress towards PT goals: Goals met/education completed, patient discharged from PT    Frequency    Min 3X/week      PT Plan Current plan remains appropriate    Co-evaluation              AM-PAC PT "6 Clicks" Daily Activity  Outcome Measure  Difficulty turning over in bed (including adjusting bedclothes, sheets and blankets)?: None Difficulty moving from lying on back to sitting on the side of the bed? : None Difficulty sitting down on and standing up from a chair with arms (e.g., wheelchair, bedside commode, etc,.)?: None Help needed moving to and from a bed to chair (including a wheelchair)?:  None Help needed walking in hospital room?: None Help needed climbing 3-5 steps with a railing? : A Little 6 Click Score: 23    End of Session Equipment Utilized During Treatment: Gait belt;Oxygen Activity Tolerance: Patient tolerated treatment well Patient left: in bed;with call bell/phone within reach Nurse Communication: Mobility status PT Visit Diagnosis: Unsteadiness on feet (R26.81)     Time: 6067-7034 PT Time Calculation (min) (ACUTE ONLY): 15 min  Charges:  $Gait Training: 8-22 mins                    Mabeline Caras, PT, DPT Acute Rehabilitation Services  Pager [phone] Office [place] 06/12/2018, 1:29 PM

## 2018-06-12 NOTE — Care Management Note (Signed)
Case Management Note  Patient Details  Name: Theresa Fernandez MRN: [id number] Date of Birth: [birth year]  Subjective/Objective:  Pt presented for COPD Exacerbation. PTA from home with support of husband. Patient has DME RW and [place] in the home. Pt will only need DME 02. Referral made to [facility] with [facility]. DME 02 will be delivered to room prior to transition home.                 Action/Plan: CM did offer choice for [place] [facility]- declined [facility]. Patient wants to use [facility] for [place] as well. CM did make referral with Linna Hoff- SOC to begin within 24-48 hours post transition home. No further needs from CM at this time.   Expected Discharge Date:  06/12/18               Expected Discharge Plan:  [place]  In-House Referral:  NA  Discharge planning Services  CM Consult  Post Acute Care Choice:  Durable Medical Equipment, Home Health Choice offered to:  Patient  DME Arranged:  Oxygen DME Agency:  [place]:  PT [facility] Agency:  [place]  Status of Service:  In process, will continue to follow  If discussed at Long Length of Stay Meetings, dates discussed:    Additional Comments:  Bethena Roys, RN 06/12/2018, 12:52 PM

## 2018-06-12 NOTE — Progress Notes (Signed)
SATURATION QUALIFICATIONS: (This note is used to comply with regulatory documentation for home oxygen)  Patient Saturations on Room Air at Rest = 92%  Patient Saturations on Room Air while Ambulating =86%  Patient Saturations on 2 Liters of oxygen while Ambulating = 91%  Please briefly explain why patient needs home oxygen: oxygen sats below 88 percent on room air with ambulation

## 2018-06-12 NOTE — Discharge Summary (Addendum)
Discharge Summary  Theresa Fernandez YJE:[id number] DOB: 1956/04/20  PCP: Theodis Blaze, MD  Admit date: 06/06/2018 Discharge date: 06/12/2018  Time spent: 35 minutes  Recommendations for Outpatient Follow-up:  1. Follow-up with pulmonology 2. Follow-up with ENT 3. Follow-up with your PCP 4. Follow-up with cardiology after referral from your PCP 5. Abstain from tobacco use  Discharge Diagnoses:  Active Hospital Problems   Diagnosis Date Noted  . COPD with acute exacerbation ([place]) 06/06/2018  . Acute on chronic heart failure with preserved ejection fraction (HFpEF) ([place]) 06/09/2018  . CKD (chronic kidney disease) stage 3, GFR 30-59 ml/min (H[facility]) 06/09/2018  . Cellulitis of abdominal wall 06/06/2018  . Spinal compression fracture ([place])   . Dyslipidemia 02/11/2013  . DM2 (diabetes mellitus, type 2) ([place]) 02/11/2013  . HTN (hypertension) 01/12/2013  . Depression 01/12/2013    Resolved Hospital Problems   Diagnosis Date Noted Date Resolved  . CHF exacerbation ([place]) 06/06/2018 06/09/2018  . Pulmonary edema 06/06/2018 06/09/2018  . Dyspnea 06/06/2018 06/09/2018    Discharge Condition: Stable  Diet recommendation: Resume previous diet  Vitals:   06/12/18 0538 06/12/18 0746  BP: 124/67   Pulse: 72   Resp:    Temp: 97.7 F (36.5 C)   SpO2: 97% 95%    History of present illness:  Theresa Thompsonis a 62 y/o withCOPD, hypertension, depression, anxiety who presented to the ED with complaints of shortness of breath,increasing pedal edema, 20 lb weight gain in 6-8 mo,fatigue, wheezing and cough. Stopped smoking 1 wk ago. Nebs did not help at home.  In ED >Respiratory rate was 24-34, blood pressure 110sto 130s over 50s to 70s. Heart rate 99 on arrival, 130safter continuous nebs. Oxygen saturation 92%- 100%on room air but dropping with ambulation. Chest x-ray showed interstitial edema.  Hospital course complicated by persistent hypoxia on ambulation and with minimal  exertion.  Was given IV Lasix and IV steroids with improvement of symptoms. Failed 2 home O2 evaluations, requiring O2 supplementation on ambulation.  06/12/2018: Patient seen and examined at her bedside.  States she feels better today.  Wheezing is improving.  States she had seen an ENT in the past.  Advised to follow-up with ENT, pulmonology, and PCP posthospitalization.  Patient understands and agrees to plan.  On the day of discharge, the patient was hemodynamically stable.    Hospital Course:  Principal Problem:   COPD with acute exacerbation ([place]) Active Problems:   HTN (hypertension)   Depression   DM2 (diabetes mellitus, type 2) ([place])   Dyslipidemia   Spinal compression fracture (H[facility])   Cellulitis of abdominal wall   Acute on chronic heart failure with preserved ejection fraction (HFpEF) (H[facility])   CKD (chronic kidney disease) stage 3, GFR 30-59 ml/min (HCC)  Acute exacerbation of COPD Completed course of doxycycline and Mucinex  Completed IV Solu-Medrol 60 mg every 6 hours and IV Lasix 40 mg twice daily Continue nebs at home.  Patient states she has machine and nebs. Start Solu-Medrol p.o. Taper Start Lasix 40 mg p.o. Daily Continue potassium supplement 20 mEq daily when taking Lasix   Acute hypoxic respiratory failure secondary to acute COPD exacerbation Desaturation in the 80s on ambulation Management as stated above Maintain O2 saturation greater than 90% Repeat home O2 evaluation on 06/12/2018  Acute on chronic heart failure with preserved ejection fraction Continue p.o. Lasix 40 mg daily Follow-up with cardiology  CKD 3 Baseline GFR 30-59 Appears stable Creatinine improving 1.26 from 1.35 Follow-up with your PCP posthospitalization  Depression  Appears stable Continue home medications  Type 2 diabetes Appears stable Follow-up with PCP  Hyperlipidemia Continue statin  History of spinal compression fracture Plan for kyphoplasty  outpatient Continue pain medications as needed  Former smoker Quit smoking at the beginning of this month Nicotine patch    Code Status: Full code   Consultants:  None  Procedures:  None  Antimicrobials:  Completed course of p.o. doxycycline      Discharge Exam: BP 124/67 (BP Location: Left Wrist)   Pulse 72   Temp 97.7 F (36.5 C) (Oral)   Resp 20   Ht 5\' 8"  (1.727 m)   Wt 110.1 kg   SpO2 95%   BMI 36.92 kg/m  . General: 62 y.o. year-old female obese in no acute distress.  Alert and oriented x3. . Cardiovascular: Regular rate and rhythm with no rubs or gallops.  No thyromegaly or JVD noted.   Marland Kitchen Respiratory: Mild anterior wheezes noted. Good inspiratory effort. . Abdomen: Soft nontender nondistended with normal bowel sounds x4 quadrants. . Musculoskeletal: No lower extremity edema. 2/4 pulses in all 4 extremities. . Skin: No ulcerative lesions noted or rashes, . Psychiatry: Mood is appropriate for condition and setting  Discharge Instructions You were cared for by a hospitalist during your hospital stay. If you have any questions about your discharge medications or the care you received while you were in the hospital after you are discharged, you can call the unit and asked to speak with the hospitalist on call if the hospitalist that took care of you is not available. Once you are discharged, your primary care physician will handle any further medical issues. Please note that NO REFILLS for any discharge medications will be authorized once you are discharged, as it is imperative that you return to your primary care physician (or establish a relationship with a primary care physician if you do not have one) for your aftercare needs so that they can reassess your need for medications and monitor your lab values.   Allergies as of 06/12/2018      Reactions   Ceftriaxone Hives, Rash   After second dose on 06/07/2018,   Topamax [topiramate]    Voltaren  [diclofenac Sodium]    migraine      Medication List    STOP taking these medications   ALPRAZolam 0.5 MG tablet Commonly known as:  XANAX   atorvastatin 20 MG tablet Commonly known as:  LIPITOR   carisoprodol 350 MG tablet Commonly known as:  SOMA   lisinopril 2.5 MG tablet Commonly known as:  PRINIVIL,ZESTRIL   lovastatin 20 MG tablet Commonly known as:  MEVACOR Replaced by:  pravastatin 20 MG tablet   oxyCODONE-acetaminophen 10-325 MG tablet Commonly known as:  PERCOCET   POTASSIUM CHLORIDE PO     TAKE these medications   albuterol (2.5 MG/3ML) 0.083% nebulizer solution Commonly known as:  PROVENTIL Take 2.5 mg by nebulization every 6 (six) hours as needed for wheezing or shortness of breath (wheezing).   albuterol 108 (90 Base) MCG/ACT inhaler Commonly known as:  PROVENTIL HFA;VENTOLIN HFA Inhale 2 puffs into the lungs every 6 (six) hours as needed for wheezing or shortness of breath (wheezing).   amphetamine-dextroamphetamine 20 MG tablet Commonly known as:  ADDERALL Take 1 tablet (20 mg total) by mouth 3 (three) times daily.   busPIRone 15 MG tablet Commonly known as:  BUSPAR Take 15 mg by mouth 2 (two) times daily.   famotidine 20 MG tablet Commonly known as:  PEPCID Take 1 tablet (20 mg total) by mouth 2 (two) times daily.   furosemide 40 MG tablet Commonly known as:  LASIX Take 1 tablet (40 mg total) by mouth daily. What changed:    medication strength  how much to take  when to take this  reasons to take this   HYDROcodone-acetaminophen 7.5-325 MG tablet Commonly known as:  NORCO Take 1 tablet by mouth every 6 (six) hours as needed.   hydrOXYzine 25 MG tablet Commonly known as:  ATARAX/VISTARIL Take 25 mg by mouth 3 (three) times daily.   lamoTRIgine 25 MG tablet Commonly known as:  LAMICTAL Take 25 mg by mouth daily.   magnesium gluconate 500 MG tablet Commonly known as:  MAGONATE Take 1 tablet (500 mg total) by mouth 2 (two)  times daily.   methocarbamol 750 MG tablet Commonly known as:  ROBAXIN Take 750 mg by mouth daily.   methylPREDNISolone 4 MG Tbpk tablet Commonly known as:  MEDROL DOSEPAK Please take as instructed on the box. Start taking on:  06/14/2018   nicotine 21 mg/24hr patch Commonly known as:  NICODERM CQ - dosed in mg/24 hours Place 1 patch (21 mg total) onto the skin daily.   omeprazole 40 MG capsule Commonly known as:  PRILOSEC Take 40 mg by mouth daily.   pioglitazone 30 MG tablet Commonly known as:  ACTOS Take 1 tablet (30 mg total) by mouth daily.   potassium chloride SA 20 MEQ tablet Commonly known as:  K-DUR,KLOR-CON Take 1 tablet (20 mEq total) by mouth daily.   pravastatin 20 MG tablet Commonly known as:  PRAVACHOL Take 1 tablet (20 mg total) by mouth daily at 6 PM. Replaces:  lovastatin 20 MG tablet   pregabalin 150 MG capsule Commonly known as:  LYRICA Take 1 capsule (150 mg total) by mouth at bedtime. What changed:    how much to take  when to take this   senna-docusate 8.6-50 MG tablet Commonly known as:  Senokot-S Take 1 tablet by mouth 2 (two) times daily.   sertraline 100 MG tablet Commonly known as:  ZOLOFT Take 100 mg by mouth 2 (two) times daily.            Durable Medical Equipment  (From admission, onward)         Start     Ordered   06/12/18 1208  For home use only DME oxygen  Once    Comments:  Patient Saturations on Room Air at Rest = 90%  Patient Saturations on Room Air while Ambulating = 85%  Patient Saturations on 3 Liters of oxygen while Ambulating = 94%  Please briefly explain why patient needs home oxygen: Unable to maintain O2 Saturation above 95% on room air.  Question Answer Comment  Mode or (Route) Nasal cannula   Liters per Minute 3   Oxygen conserving device Yes   Oxygen delivery system Gas      06/12/18 1208         Allergies  Allergen Reactions  . Ceftriaxone Hives and Rash    After second dose on  06/07/2018,  . Topamax [Topiramate]   . Voltaren [Diclofenac Sodium]     migraine   Follow-up Information    [facility]. Call in 1 day(s).   Why:  Please call for a post hospital follow-up appointment. Contact information: [place] [phone]       Brand Males, MD. Call in 1  day(s).   Specialty:  Pulmonary Disease Why:  Please call for a post hospital follow-up appointment. Contact information: [place] [address] [phone]            The results of significant diagnostics from this hospitalization (including imaging, microbiology, ancillary and laboratory) are listed below for reference.    Significant Diagnostic Studies: Ct Thoracic Spine Wo Contrast  Result Date: 06/08/2018 CLINICAL DATA:  Back pain.  History of multiple spinal fractures EXAM: CT THORACIC AND LUMBAR SPINE WITHOUT CONTRAST TECHNIQUE: Multidetector CT imaging of the thoracic and lumbar spine was performed without contrast. Multiplanar CT image reconstructions were also generated. COMPARISON:  MRI thoracic and lumbar spine 05/08/2018 FINDINGS: CT THORACIC SPINE FINDINGS Alignment: Mild anterolisthesis T9-10 unchanged from the prior study. Mild retrolisthesis T5-6. Mild thoracic kyphosis Vertebrae: Multiple thoracic compression fractures unchanged from the prior MRI. No acute fracture. Mild compression fracture superior endplate of T3 is chronic Mild compression fracture T5 is chronic and unchanged Mild compression fracture T6 is chronic and unchanged Chronic compression or T7 with vertebroplasty unchanged Mild compression fracture T8 with vertebroplasty unchanged Mild compression fracture inferior endplate of T9 is unchanged. Severe compression fracture T10 with cement vertebral augmentation unchanged from the prior study. Paraspinal and other soft tissues: Cardiac enlargement. Left lower lobe  atelectasis and small effusion. Small right effusion Disc levels: Multilevel mild disc degeneration throughout the thoracic spine. Disc and facet degeneration and mild anterolisthesis T9-10 without significant stenosis. Disc and facet degeneration at T11-12 and T12-L1 without significant stenosis. CT LUMBAR SPINE FINDINGS Segmentation: Normal Alignment: Mild anterolisthesis L4-5 Vertebrae: Severe compression fracture of L1 is unchanged from the prior MRI. No retropulsion of bone into the canal. Negative for stenosis. No posterior element fracture. Inferior endplate fracture of L3 is unchanged from the prior study Moderate compression fracture L4 unchanged from the prior study Mild compression fracture of L5 has progressed since the prior study compatible with recent fracture. Paraspinal and other soft tissues: Mild atherosclerotic aorta. Negative for paraspinous mass or fluid collection. Disc levels: L1-2: Diffuse disc bulging without stenosis L2-3: Moderate disc bulging. Facet degeneration and mild spinal stenosis L3-4: Moderate disc bulging and facet hypertrophy. Moderate spinal stenosis. Moderate subarticular stenosis bilaterally L4-5: Mild anterolisthesis. Diffuse disc bulging with severe facet degeneration. Moderate spinal stenosis. L5-S1: Mild disc and facet degeneration without stenosis IMPRESSION: 1. Multiple thoracic compression fractures unchanged from the MRI of 05/08/2018. 2. Multiple lumbar compression fractures which are unchanged from the prior MRI. Further fracture of the L5 vertebral body since the prior MRI compatible with recent fracture. 3. Multilevel degenerative change in the lumbar spine with moderate spinal stenosis at L3-4 L4-5. Electronically Signed   By: Franchot Gallo M.D.   On: 06/08/2018 15:54   Ct Lumbar Spine Wo Contrast  Result Date: 06/08/2018 CLINICAL DATA:  Back pain.  History of multiple spinal fractures EXAM: CT THORACIC AND LUMBAR SPINE WITHOUT CONTRAST TECHNIQUE:  Multidetector CT imaging of the thoracic and lumbar spine was performed without contrast. Multiplanar CT image reconstructions were also generated. COMPARISON:  MRI thoracic and lumbar spine 05/08/2018 FINDINGS: CT THORACIC SPINE FINDINGS Alignment: Mild anterolisthesis T9-10 unchanged from the prior study. Mild retrolisthesis T5-6. Mild thoracic kyphosis Vertebrae: Multiple thoracic compression fractures unchanged from the prior MRI. No acute fracture. Mild compression fracture superior endplate of T3 is chronic Mild compression fracture T5 is chronic and unchanged Mild compression fracture T6 is chronic and unchanged Chronic compression or T7 with vertebroplasty unchanged Mild compression  fracture T8 with vertebroplasty unchanged Mild compression fracture inferior endplate of T9 is unchanged. Severe compression fracture T10 with cement vertebral augmentation unchanged from the prior study. Paraspinal and other soft tissues: Cardiac enlargement. Left lower lobe atelectasis and small effusion. Small right effusion Disc levels: Multilevel mild disc degeneration throughout the thoracic spine. Disc and facet degeneration and mild anterolisthesis T9-10 without significant stenosis. Disc and facet degeneration at T11-12 and T12-L1 without significant stenosis. CT LUMBAR SPINE FINDINGS Segmentation: Normal Alignment: Mild anterolisthesis L4-5 Vertebrae: Severe compression fracture of L1 is unchanged from the prior MRI. No retropulsion of bone into the canal. Negative for stenosis. No posterior element fracture. Inferior endplate fracture of L3 is unchanged from the prior study Moderate compression fracture L4 unchanged from the prior study Mild compression fracture of L5 has progressed since the prior study compatible with recent fracture. Paraspinal and other soft tissues: Mild atherosclerotic aorta. Negative for paraspinous mass or fluid collection. Disc levels: L1-2: Diffuse disc bulging without stenosis L2-3: Moderate  disc bulging. Facet degeneration and mild spinal stenosis L3-4: Moderate disc bulging and facet hypertrophy. Moderate spinal stenosis. Moderate subarticular stenosis bilaterally L4-5: Mild anterolisthesis. Diffuse disc bulging with severe facet degeneration. Moderate spinal stenosis. L5-S1: Mild disc and facet degeneration without stenosis IMPRESSION: 1. Multiple thoracic compression fractures unchanged from the MRI of 05/08/2018. 2. Multiple lumbar compression fractures which are unchanged from the prior MRI. Further fracture of the L5 vertebral body since the prior MRI compatible with recent fracture. 3. Multilevel degenerative change in the lumbar spine with moderate spinal stenosis at L3-4 L4-5. Electronically Signed   By: Franchot Gallo M.D.   On: 06/08/2018 15:54   Dg Chest Port 1 View  Result Date: 06/12/2018 CLINICAL DATA:  Hypoxia, COPD EXAM: PORTABLE CHEST 1 VIEW COMPARISON:  06/06/2018 FINDINGS: Interstitial prominence throughout the lungs, improved since prior study. Heart is borderline in size. Low lung volumes. No confluent opacities or effusions. No acute bony abnormality. IMPRESSION: Interstitial prominence throughout the lungs, improved since prior study, likely improving edema. Findings could also reflect underlying chronic interstitial lung disease. Electronically Signed   By: Rolm Baptise M.D.   On: 06/12/2018 08:50   Dg Chest Portable 1 View  Result Date: 06/06/2018 CLINICAL DATA:  Shortness of breath and chest pain for 3 days EXAM: PORTABLE CHEST 1 VIEW COMPARISON:  01/09/2015 FINDINGS: Cardiac shadow is at the upper limits of normal in size. The lungs are well aerated bilaterally. Mild interstitial changes are noted with vascular congestion consistent with edema. No focal infiltrate or effusion is noted. No acute bony abnormality is seen. Changes of prior kyphoplasty are noted. IMPRESSION: Mild vascular congestion and interstitial edema. Electronically Signed   By: Inez Catalina M.D.    On: 06/06/2018 15:48   Dg Abd Portable 1v  Result Date: 06/11/2018 CLINICAL DATA:  Hypoxia EXAM: PORTABLE ABDOMEN - 1 VIEW COMPARISON:  None. FINDINGS: Moderate formed stool. No dilated bowel and no concerning mass effect or calcification. Multiple compression fractures with lower thoracic vertebroplasty. IMPRESSION: Moderate stool volume. Nonobstructive bowel gas pattern. Electronically Signed   By: Monte Fantasia M.D.   On: 06/11/2018 13:03    Microbiology: Recent Results (from the past 240 hour(s))  Blood culture (routine x 2)     Status: None   Collection Time: 06/06/18  3:05 PM  Result Value Ref Range Status   Specimen Description BLOOD RIGHT ANTECUBITAL  Final   Special Requests   Final    BOTTLES DRAWN AEROBIC AND ANAEROBIC Blood  Culture adequate volume   Culture   Final    NO GROWTH 5 DAYS Performed at [facility], [place] [address], [place], [place] [address]    Report Status 06/11/2018 FINAL  Final  Blood culture (routine x 2)     Status: None   Collection Time: 06/06/18  3:18 PM  Result Value Ref Range Status   Specimen Description BLOOD RIGHT FOREARM  Final   Special Requests   Final    BOTTLES DRAWN AEROBIC AND ANAEROBIC Blood Culture adequate volume   Culture   Final    NO GROWTH 5 DAYS Performed at [facility], [place] [address], [place], [place] [address]    Report Status 06/11/2018 FINAL  Final  MRSA PCR Screening     Status: None   Collection Time: 06/07/18  6:58 PM  Result Value Ref Range Status   MRSA by PCR NEGATIVE NEGATIVE Final    Comment:        The GeneXpert MRSA Assay (FDA approved for NASAL specimens only), is one component of a comprehensive MRSA colonization surveillance program. It is not intended to diagnose MRSA infection nor to guide or monitor treatment for MRSA infections. Performed at [facility], [place] [address], [place], [place] [address]      Labs: Basic Metabolic Panel: Recent Labs  Lab 06/08/18 0431  06/09/18 0339 06/10/18 0252 06/11/18 0428 06/12/18 0413  NA 136 135 136 136 137  K 4.9 4.7 4.6 3.9 3.5  CL 105 102 102 97* 96*  CO2 23 26 27 30 29   GLUCOSE 153* 171* 158* 156* 164*  BUN 17 17 17  26* 35*  CREATININE 1.23* 1.22* 1.18* 1.35* 1.26*  CALCIUM 8.9 9.1 9.3 9.3 9.2  MG 2.2 2.2  --   --   --    Liver Function Tests: Recent Labs  Lab 06/06/18 1458 06/08/18 0431  AST 21 19  ALT 14 15  ALKPHOS 103 91  BILITOT 0.8 0.5  PROT 7.4 7.1  ALBUMIN 3.7 3.4*   No results for input(s): LIPASE, AMYLASE in the last 168 hours. No results for input(s): AMMONIA in the last 168 hours. CBC: Recent Labs  Lab 06/06/18 1458  06/08/18 0431 06/09/18 0339 06/10/18 0252 06/11/18 0428 06/12/18 0413  WBC 9.5   < > 16.4* 15.0* 13.1* 12.6* 13.7*  NEUTROABS 6.6  --   --   --   --   --   --   HGB 11.9*   < > 10.8* 10.7* 11.5* 11.8* 12.7  HCT 39.0   < > 36.5 35.0* 37.3 39.1 41.9  MCV 98.0   < > 98.9 97.2 97.1 95.1 95.2  PLT 315   < > 336 299 327 385 407*   < > = values in this interval not displayed.   Cardiac Enzymes: Recent Labs  Lab 06/06/18 1458  TROPONINI <0.03   BNP: BNP (last 3 results) Recent Labs    06/06/18 1458  BNP 84.1    ProBNP (last 3 results) No results for input(s): PROBNP in the last 8760 hours.  CBG: Recent Labs  Lab 06/11/18 1058 06/11/18 1631 06/11/18 2125 06/12/18 0736 06/12/18 1121  GLUCAP 158* 186* 171* 184* 153*       Signed:  Kayleen Memos, MD [facility] 06/12/2018, 12:11 PM

## 2018-06-12 NOTE — Progress Notes (Signed)
Occupational Therapy Treatment Patient Details Name: Theresa Fernandez MRN: [id number] DOB: 1955/10/14 Today's Date: 06/12/2018    History of present illness Pt is a 62 y.o. female admitted 06/06/18 with SOB, fatigue and cough. Worked up for CHF and COPD exacerbation. PMH includes COPD, HTN, depression, anxiety, chronic compression fxs (per pt, potential for sx with Dumonski).   OT comments  Pt with good recall of energy conservation and demonstrates during functional task this session with no cues. Pt demonstrates RA saturations 88% or better during session. Pt eager to d/c due to mother currently admitted on 4NP unit.    Follow Up Recommendations  No OT follow up    Equipment Recommendations  None recommended by OT    Recommendations for Other Services      Precautions / Restrictions Precautions Precautions: Fall;Back Precaution Comments: chronic spinal compression fxs (pt verbalized pending sx with Dumonski); reviewed back precautions        Mobility Bed Mobility Overal bed mobility: Modified Independent                Transfers Overall transfer level: Independent                    Balance                                           ADL either performed or assessed with clinical judgement   ADL   Eating/Feeding: Modified independent                   Lower Body Dressing: Supervision/safety Lower Body Dressing Details (indicate cue type and reason): completed LB dressing with figure 4 crossing.  Toilet Transfer: Supervision/safety;RW           Functional mobility during ADLs: Supervision/safety;Rolling walker General ADL Comments: pt verbalized energy conservation techniques and demonstrates with LB dressing. pt able to apply socks this session. pt on RA on arrival with 95% RA. pt moving with therapy and saturations remain 88% RA. Pt rebounding quickly to 90-95% RA. RN notified      Vision       Perception      Praxis      Cognition Arousal/Alertness: Awake/alert Behavior During Therapy: WFL for tasks assessed/performed Overall Cognitive Status: Within Functional Limits for tasks assessed                                          Exercises     Shoulder Instructions       General Comments      Pertinent Vitals/ Pain       Pain Assessment: No/denies pain  Home Living                                          Prior Functioning/Environment              Frequency  Min 2X/week        Progress Toward Goals  OT Goals(current goals can now be found in the care plan section)  Progress towards OT goals: Goals met/education completed, patient discharged from OT  Acute Rehab OT Goals Patient Stated Goal: to discharge so i can go  see my mom on the 4th floor OT Goal Formulation: With patient Time For Goal Achievement: 06/21/18 Potential to Achieve Goals: Good ADL Goals Pt Will Perform Grooming: with modified independence;standing Pt Will Perform Upper Body Dressing: with modified independence;sitting Pt Will Perform Lower Body Dressing: with supervision;sit to/from stand Pt Will Transfer to Toilet: with modified independence;ambulating Pt Will Perform Toileting - Clothing Manipulation and hygiene: with modified independence;sit to/from stand Additional ADL Goal #1: Pt will recall 3 ways of conserving energy during ADL at indpendent level  Plan All goals met and education completed, patient discharged from OT services    Co-evaluation                 AM-PAC PT "6 Clicks" Daily Activity     Outcome Measure   Help from another person eating meals?: None Help from another person taking care of personal grooming?: None Help from another person toileting, which includes using toliet, bedpan, or urinal?: None Help from another person bathing (including washing, rinsing, drying)?: None Help from another person to put on and taking off  regular upper body clothing?: None Help from another person to put on and taking off regular lower body clothing?: None 6 Click Score: 24    End of Session Equipment Utilized During Treatment: Rolling walker  OT Visit Diagnosis: Unsteadiness on feet (R26.81);Muscle weakness (generalized) (M62.81)   Activity Tolerance Patient tolerated treatment well   Patient Left in bed;Other (comment)(sitting EOB )   Nurse Communication Mobility status;Precautions        Time: 1586-8257 OT Time Calculation (min): 12 min  Charges: OT General Charges $OT Visit: 1 Visit OT Treatments $Self Care/Home Management : 8-22 mins   Jeri Modena, OTR/L  Acute Rehabilitation Services Pager: [phone] Office: [phone] .    Jeri Modena 06/12/2018, 12:55 PM

## 2018-06-12 NOTE — Progress Notes (Signed)
I reviewed discharge instructions with patient. She had no further questions and was discharged via wheelchair with husband in stable condition

## 2018-06-12 NOTE — Progress Notes (Signed)
Made Dr. Nevada Crane aware of patients need for home oxygen.

## 2018-06-22 ENCOUNTER — Ambulatory Visit (INDEPENDENT_AMBULATORY_CARE_PROVIDER_SITE_OTHER): Admitting: Pulmonary Disease

## 2018-06-22 ENCOUNTER — Encounter: Payer: Self-pay | Admitting: Pulmonary Disease

## 2018-06-22 VITALS — BP 128/88 | HR 83 | Ht 68.0 in | Wt 254.0 lb

## 2018-06-22 DIAGNOSIS — Z6838 Body mass index (BMI) 38.0-38.9, adult: Secondary | ICD-10-CM

## 2018-06-22 DIAGNOSIS — J449 Chronic obstructive pulmonary disease, unspecified: Secondary | ICD-10-CM

## 2018-06-22 DIAGNOSIS — Z87891 Personal history of nicotine dependence: Secondary | ICD-10-CM | POA: Insufficient documentation

## 2018-06-22 DIAGNOSIS — Z720 Tobacco use: Secondary | ICD-10-CM | POA: Diagnosis not present

## 2018-06-22 DIAGNOSIS — I5032 Chronic diastolic (congestive) heart failure: Secondary | ICD-10-CM

## 2018-06-22 MED ORDER — TIOTROPIUM BROMIDE MONOHYDRATE 1.25 MCG/ACT IN AERS: 2.0000 | INHALATION_SPRAY | Freq: Every day | RESPIRATORY_TRACT | 0 refills | Status: DC

## 2018-06-22 MED ORDER — ALBUTEROL SULFATE HFA 108 (90 BASE) MCG/ACT IN AERS: 2.0000 | INHALATION_SPRAY | Freq: Four times a day (QID) | RESPIRATORY_TRACT | 5 refills | Status: DC | PRN

## 2018-06-22 NOTE — Progress Notes (Signed)
Synopsis: Referred in Dec 2019 for COPD exacerbation by Theodis Blaze, MD  Subjective:   PATIENT ID: Theresa Fernandez GENDER: female DOB: 11/05/1955, MRN: [id number]  Chief Complaint  Patient presents with  . Consult    Recently seen in ER for COPD exacerbation. States she was diagnosed 15 years ago, former smoker. She uses Albuterol nebs 4x/day and Albuterol inhaler PRN.     PMH DMII, HTN, COPD. She quit smoking on 05/31/2018. Smoked from age 62 y ears to 22, at her max was smoking 3 packs per day. She ultimately went to the ER and was admitted to the hospital on 06/06/2018 for AECOPD and decompensated heart failure. She was seen by a pulmonologist at [facility]. She was tried on several DPIs and each made her feel like she was going to vomit. She was treated with steroids and albuterol/ antibiotics while in the hospital. She was given diuretics and said she lost 20 lbs. She was given oxygen to use at home when she needed it.  She has not had any recent axial CT imaging of the chest.  She also has chronic pain due to thoracic compression fractures.  She is followed also at the [facility] for this.  She is taking chronic Percocet use.  She was diagnosed with obstructive sleep apnea greater than 10 years ago.  This was her last sleep study.  She does not remember where this was completed.  She was tried on several masks during that time.  She did not like the way that any of the masks felt.  She had very much trouble using a full facemask not having any dentures or full teeth.  She also had trouble with the nasal pillows rolling off her face.  During her hospitalization she was placed on BiPAP for some period of time.  She said she did very well while using BiPAP and would consider using it at home.  She still is exposed to significant secondhand smoke from her family members who live with her as well as her husband.     Past Medical History:  Diagnosis Date  . Allergy   .  Anxiety   . Arthritis   . COPD (chronic obstructive pulmonary disease) ([place])   . Depression   . Diabetes mellitus without complication ([place])   . HTN (hypertension) 01/12/2013     Family History  Problem Relation Age of Onset  . Diabetes Mother   . Alcohol abuse Father   . Diabetes Father      Past Surgical History:  Procedure Laterality Date  . TUBAL LIGATION      Social History   Socioeconomic History  . Marital status: Married    Spouse name: Not on file  . Number of children: Not on file  . Years of education: Not on file  . Highest education level: Not on file  Occupational History  . Not on file  Social Needs  . Financial resource strain: Not on file  . Food insecurity:    Worry: Not on file    Inability: Not on file  . Transportation needs:    Medical: Not on file    Non-medical: Not on file  Tobacco Use  . Smoking status: Former Smoker    Packs/day: 1.00    Years: 50.00    Pack years: 50.00    Types: Cigarettes    Last attempt to quit: 05/30/2018    Years since quitting: 0.0  . Smokeless tobacco: Never Used  Substance and Sexual Activity  . Alcohol use: No  . Drug use: No  . Sexual activity: Not on file  Lifestyle  . Physical activity:    Days per week: Not on file    Minutes per session: Not on file  . Stress: Not on file  Relationships  . Social connections:    Talks on phone: Not on file    Gets together: Not on file    Attends religious service: Not on file    Active member of club or organization: Not on file    Attends meetings of clubs or organizations: Not on file    Relationship status: Not on file  . Intimate partner violence:    Fear of current or ex partner: Not on file    Emotionally abused: Not on file    Physically abused: Not on file    Forced sexual activity: Not on file  Other Topics Concern  . Not on file  Social History Narrative  . Not on file     Allergies  Allergen Reactions  . Ceftriaxone Hives and Rash     After second dose on 06/07/2018,  . Topamax [Topiramate]   . Voltaren [Diclofenac Sodium]     migraine     Outpatient Medications Prior to Visit  Medication Sig Dispense Refill  . albuterol (PROVENTIL) (2.5 MG/3ML) 0.083% nebulizer solution Take 2.5 mg by nebulization every 6 (six) hours as needed for wheezing or shortness of breath (wheezing).     Marland Kitchen amphetamine-dextroamphetamine (ADDERALL) 20 MG tablet Take 1 tablet (20 mg total) by mouth 3 (three) times daily. 90 tablet 0  . busPIRone (BUSPAR) 15 MG tablet Take 15 mg by mouth 2 (two) times daily.     . famotidine (PEPCID) 20 MG tablet Take 1 tablet (20 mg total) by mouth 2 (two) times daily. 30 tablet 0  . furosemide (LASIX) 40 MG tablet Take 1 tablet (40 mg total) by mouth daily. 30 tablet 0  . HYDROcodone-acetaminophen (NORCO) 7.5-325 MG tablet Take 1 tablet by mouth every 6 (six) hours as needed.  0  . hydrOXYzine (ATARAX/VISTARIL) 25 MG tablet Take 25 mg by mouth 3 (three) times daily.    Marland Kitchen lamoTRIgine (LAMICTAL) 25 MG tablet Take 25 mg by mouth daily.    . magnesium gluconate (MAGONATE) 500 MG tablet Take 1 tablet (500 mg total) by mouth 2 (two) times daily. 60 tablet 3  . methocarbamol (ROBAXIN) 750 MG tablet Take 750 mg by mouth daily.    Marland Kitchen omeprazole (PRILOSEC) 40 MG capsule Take 40 mg by mouth daily.    . pioglitazone (ACTOS) 30 MG tablet Take 1 tablet (30 mg total) by mouth daily. 30 tablet 3  . potassium chloride SA (K-DUR,KLOR-CON) 20 MEQ tablet Take 1 tablet (20 mEq total) by mouth daily. 30 tablet 0  . pravastatin (PRAVACHOL) 20 MG tablet Take 1 tablet (20 mg total) by mouth daily at 6 PM. 30 tablet 0  . pregabalin (LYRICA) 150 MG capsule Take 1 capsule (150 mg total) by mouth at bedtime. (Patient taking differently: Take 100 mg by mouth 2 (two) times daily. ) 30 capsule 3  . sertraline (ZOLOFT) 100 MG tablet Take 100 mg by mouth 2 (two) times daily.     Marland Kitchen albuterol (PROVENTIL HFA;VENTOLIN HFA) 108 (90 BASE) MCG/ACT inhaler  Inhale 2 puffs into the lungs every 6 (six) hours as needed for wheezing or shortness of breath (wheezing).     . methylPREDNISolone (MEDROL DOSEPAK) 4 MG  TBPK tablet Please take as instructed on the box. (Patient not taking: Reported on 06/22/2018) 21 tablet 0  . nicotine (NICODERM CQ - DOSED IN MG/24 HOURS) 21 mg/24hr patch Place 1 patch (21 mg total) onto the skin daily. (Patient not taking: Reported on 06/22/2018) 30 patch 0  . senna-docusate (SENOKOT-S) 8.6-50 MG tablet Take 1 tablet by mouth 2 (two) times daily. (Patient not taking: Reported on 06/22/2018) 30 tablet 0   No facility-administered medications prior to visit.     Review of Systems  Constitutional: Negative for chills, fever, malaise/fatigue and weight loss.  HENT: Negative for hearing loss, sore throat and tinnitus.   Eyes: Negative for blurred vision and double vision.  Respiratory: Positive for shortness of breath. Negative for cough, hemoptysis, sputum production, wheezing and stridor.   Cardiovascular: Negative for chest pain, palpitations, orthopnea, leg swelling and PND.       Dyspnea on exertion  Gastrointestinal: Negative for abdominal pain, constipation, diarrhea, heartburn, nausea and vomiting.  Genitourinary: Negative for dysuria, hematuria and urgency.  Musculoskeletal: Negative for joint pain and myalgias.  Skin: Negative for itching and rash.  Neurological: Negative for dizziness, tingling, weakness and headaches.  Endo/Heme/Allergies: Negative for environmental allergies. Does not bruise/bleed easily.  Psychiatric/Behavioral: Negative for depression. The patient is not nervous/anxious and does not have insomnia.   All other systems reviewed and are negative.    Objective:  Physical Exam  Constitutional: She is oriented to person, place, and time. She appears well-developed and well-nourished. No distress.  HENT:  Head: Normocephalic and atraumatic.  Mouth/Throat: Oropharynx is clear and moist.  Tobacco  stained tongue  Eyes: Pupils are equal, round, and reactive to light. Conjunctivae are normal. No scleral icterus.  Neck: Neck supple. No JVD present. No tracheal deviation present.  Cardiovascular: Normal rate, regular rhythm and intact distal pulses.  Murmur (Faint systolic murmur left sternal border) heard. Pulmonary/Chest: Effort normal and breath sounds normal. No accessory muscle usage or stridor. No tachypnea. No respiratory distress. She has no wheezes. She has no rhonchi. She has no rales.  Abdominal: Soft. Bowel sounds are normal. She exhibits no distension. There is no tenderness.  Morbidly obese pannus  Musculoskeletal: She exhibits edema (Trace lower extremity edema). She exhibits no tenderness.  Lymphadenopathy:    She has no cervical adenopathy.  Neurological: She is alert and oriented to person, place, and time.  Skin: Skin is warm and dry. Capillary refill takes less than 2 seconds. No rash noted.  Psychiatric: She has a normal mood and affect. Her behavior is normal.  Vitals reviewed.    Vitals:   06/22/18 1505  BP: 128/88  Pulse: 83  SpO2: 94%  Weight: 254 lb (115.2 kg)  Height: 5\' 8"  (1.727 m)   94% on RA BMI Readings from Last 3 Encounters:  06/22/18 38.62 kg/m  06/12/18 36.92 kg/m  05/18/15 37.10 kg/m   Wt Readings from Last 3 Encounters:  06/22/18 254 lb (115.2 kg)  06/12/18 242 lb 12.8 oz (110.1 kg)  05/18/15 244 lb (110.7 kg)     CBC    Component Value Date/Time   WBC 13.7 (H) 06/12/2018 0413   RBC 4.40 06/12/2018 0413   HGB 12.7 06/12/2018 0413   HCT 41.9 06/12/2018 0413   PLT 407 (H) 06/12/2018 0413   MCV 95.2 06/12/2018 0413   MCH 28.9 06/12/2018 0413   MCHC 30.3 06/12/2018 0413   RDW 13.6 06/12/2018 0413   LYMPHSABS 1.8 06/06/2018 1458   MONOABS 0.6 06/06/2018  1458   EOSABS 0.3 06/06/2018 1458   BASOSABS 0.1 06/06/2018 1458    Chest Imaging: 06/06/2018 chest x-ray: Bilateral vascular congestion 06/12/2018 chest x-ray:  Bilateral interstitial prominence somewhat improved when compared to previous x-ray  Pulmonary Functions Testing Results: No flowsheet data found.  FeNO: None   Pathology: None   Echocardiogram: 06/08/2018 - Preserved EF, Grade 1 DD.   Heart Catheterization: None     Assessment & Plan:   Chronic obstructive pulmonary disease, unspecified COPD type ([place]) - Plan: Pulmonary function test  Tobacco abuse  Former smoker  BMI 54.2-70.6,CBJSE  Chronic diastolic heart failure ([place])  Discussion:  This is a 62 year old female with a very significant smoking history, 45+ pack years.  She was labeled with COPD in the past.  I have no prior PFTs or spirometry to review at this time.  She does have significant morbid obesity as well as chronic diastolic heart failure.  She has been diagnosed with sleep apnea in the past and is noncompliant with CPAP at this time due to an unknown fitting mask.  We will recommend the following: We will give her a sample of Spiriva Respimat as she did not like any of the DPI's that she used in the past Refill of albuterol inhaler Full PFTs New consultation with sleep physician for evaluation of OSA and possibly OHS Enroll in our lung cancer screening program for low-dose CT  Follow-up with APP in 4 weeks to ensure symptoms are improved on new inhaler regimen and reviewed PFTs.  Greater than 50% of this patient's 60-minute office visit was spent face-to-face discussing the above recommendations and diagnostic approach for evaluation of her underlying COPD as well as enrollment in the lung cancer screening program and recommendations for evaluation by 1 of our sleep physicians.   Current Outpatient Medications:  .  albuterol (PROVENTIL HFA;VENTOLIN HFA) 108 (90 Base) MCG/ACT inhaler, Inhale 2 puffs into the lungs every 6 (six) hours as needed for wheezing or shortness of breath (wheezing)., Disp: 1 Inhaler, Rfl: 5 .  albuterol (PROVENTIL) (2.5 MG/3ML) 0.083%  nebulizer solution, Take 2.5 mg by nebulization every 6 (six) hours as needed for wheezing or shortness of breath (wheezing). , Disp: , Rfl:  .  amphetamine-dextroamphetamine (ADDERALL) 20 MG tablet, Take 1 tablet (20 mg total) by mouth 3 (three) times daily., Disp: 90 tablet, Rfl: 0 .  busPIRone (BUSPAR) 15 MG tablet, Take 15 mg by mouth 2 (two) times daily. , Disp: , Rfl:  .  famotidine (PEPCID) 20 MG tablet, Take 1 tablet (20 mg total) by mouth 2 (two) times daily., Disp: 30 tablet, Rfl: 0 .  furosemide (LASIX) 40 MG tablet, Take 1 tablet (40 mg total) by mouth daily., Disp: 30 tablet, Rfl: 0 .  HYDROcodone-acetaminophen (NORCO) 7.5-325 MG tablet, Take 1 tablet by mouth every 6 (six) hours as needed., Disp: , Rfl: 0 .  hydrOXYzine (ATARAX/VISTARIL) 25 MG tablet, Take 25 mg by mouth 3 (three) times daily., Disp: , Rfl:  .  lamoTRIgine (LAMICTAL) 25 MG tablet, Take 25 mg by mouth daily., Disp: , Rfl:  .  magnesium gluconate (MAGONATE) 500 MG tablet, Take 1 tablet (500 mg total) by mouth 2 (two) times daily., Disp: 60 tablet, Rfl: 3 .  methocarbamol (ROBAXIN) 750 MG tablet, Take 750 mg by mouth daily., Disp: , Rfl:  .  omeprazole (PRILOSEC) 40 MG capsule, Take 40 mg by mouth daily., Disp: , Rfl:  .  pioglitazone (ACTOS) 30 MG tablet, Take 1 tablet (30  mg total) by mouth daily., Disp: 30 tablet, Rfl: 3 .  potassium chloride SA (K-DUR,KLOR-CON) 20 MEQ tablet, Take 1 tablet (20 mEq total) by mouth daily., Disp: 30 tablet, Rfl: 0 .  pravastatin (PRAVACHOL) 20 MG tablet, Take 1 tablet (20 mg total) by mouth daily at 6 PM., Disp: 30 tablet, Rfl: 0 .  pregabalin (LYRICA) 150 MG capsule, Take 1 capsule (150 mg total) by mouth at bedtime. (Patient taking differently: Take 100 mg by mouth 2 (two) times daily. ), Disp: 30 capsule, Rfl: 3 .  sertraline (ZOLOFT) 100 MG tablet, Take 100 mg by mouth 2 (two) times daily. , Disp: , Rfl:  .  Tiotropium Bromide Monohydrate (SPIRIVA RESPIMAT) 1.25 MCG/ACT AERS, Inhale 2  puffs into the lungs daily., Disp: 1 Inhaler, Rfl: 0   Garner Nash, DO [facility] 06/22/2018 3:33 PM

## 2018-06-22 NOTE — Patient Instructions (Addendum)
Thank you for visiting Dr. Valeta Harms at [facility]. Today we recommend the following: Orders Placed This Encounter  Procedures  . Pulmonary function test   Meds ordered this encounter  Medications  . albuterol (PROVENTIL HFA;VENTOLIN HFA) 108 (90 Base) MCG/ACT inhaler    Sig: Inhale 2 puffs into the lungs every 6 (six) hours as needed for wheezing or shortness of breath (wheezing).    Dispense:  1 Inhaler    Refill:  5  . Tiotropium Bromide Monohydrate (SPIRIVA RESPIMAT) 1.25 MCG/ACT AERS    Sig: Inhale 2 puffs into the lungs daily.    Dispense:  1 Inhaler    Refill:  0   Return in about 4 weeks (around 07/20/2018). Follow up appointment in 4 weeks with APP to ensure symptoms are better with new inhaler regimen and review PFTs.   Please set patient up with office sleep consultation for obstructive sleep apnea with Dr. Ander Slade.   Please schedule a follow up appointment with Eric Form, NP for enrollment into the [place] Screening Program.

## 2018-06-25 ENCOUNTER — Emergency Department (HOSPITAL_COMMUNITY)

## 2018-06-25 ENCOUNTER — Emergency Department (HOSPITAL_COMMUNITY)
Admission: EM | Admit: 2018-06-25 | Discharge: 2018-06-25 | Disposition: A | Discharge status: Home / Self Care | Attending: Emergency Medicine | Admitting: Emergency Medicine

## 2018-06-25 DIAGNOSIS — J449 Chronic obstructive pulmonary disease, unspecified: Secondary | ICD-10-CM | POA: Diagnosis not present

## 2018-06-25 DIAGNOSIS — Z87891 Personal history of nicotine dependence: Secondary | ICD-10-CM | POA: Diagnosis not present

## 2018-06-25 DIAGNOSIS — I509 Heart failure, unspecified: Secondary | ICD-10-CM | POA: Diagnosis not present

## 2018-06-25 DIAGNOSIS — N183 Chronic kidney disease, stage 3 (moderate): Secondary | ICD-10-CM | POA: Insufficient documentation

## 2018-06-25 DIAGNOSIS — R600 Localized edema: Secondary | ICD-10-CM | POA: Insufficient documentation

## 2018-06-25 DIAGNOSIS — R0602 Shortness of breath: Secondary | ICD-10-CM | POA: Diagnosis present

## 2018-06-25 DIAGNOSIS — E1122 Type 2 diabetes mellitus with diabetic chronic kidney disease: Secondary | ICD-10-CM | POA: Diagnosis not present

## 2018-06-25 DIAGNOSIS — Z79899 Other long term (current) drug therapy: Secondary | ICD-10-CM | POA: Insufficient documentation

## 2018-06-25 DIAGNOSIS — I130 Hypertensive heart and chronic kidney disease with heart failure and stage 1 through stage 4 chronic kidney disease, or unspecified chronic kidney disease: Secondary | ICD-10-CM | POA: Insufficient documentation

## 2018-06-25 LAB — CBC
HCT: 39.4 % (ref 36.0–46.0)
Hemoglobin: 11.6 g/dL — ABNORMAL LOW (ref 12.0–15.0)
MCH: 29.4 pg (ref 26.0–34.0)
MCHC: 29.4 g/dL — ABNORMAL LOW (ref 30.0–36.0)
MCV: 99.7 fL (ref 80.0–100.0)
Platelets: 247 10*3/uL (ref 150–400)
RBC: 3.95 MIL/uL (ref 3.87–5.11)
RDW: 13.5 % (ref 11.5–15.5)
WBC: 10 10*3/uL (ref 4.0–10.5)
nRBC: 0 % (ref 0.0–0.2)

## 2018-06-25 LAB — I-STAT TROPONIN, ED: Troponin i, poc: 0 ng/mL (ref 0.00–0.08)

## 2018-06-25 LAB — BRAIN NATRIURETIC PEPTIDE: B Natriuretic Peptide: 63.8 pg/mL (ref 0.0–100.0)

## 2018-06-25 LAB — BASIC METABOLIC PANEL
Anion gap: 9 (ref 5–15)
BUN: 18 mg/dL (ref 8–23)
CO2: 27 mmol/L (ref 22–32)
Calcium: 8.9 mg/dL (ref 8.9–10.3)
Chloride: 102 mmol/L (ref 98–111)
Creatinine, Ser: 1.36 mg/dL — ABNORMAL HIGH (ref 0.44–1.00)
GFR calc Af Amer: 48 mL/min — ABNORMAL LOW (ref >60)
GFR calc non Af Amer: 41 mL/min — ABNORMAL LOW (ref >60)
GLUCOSE: 98 mg/dL (ref 70–99)
Potassium: 4.6 mmol/L (ref 3.5–5.1)
Sodium: 138 mmol/L (ref 135–145)

## 2018-06-25 IMAGING — DX DG CHEST 2V
2 series · 2 of 2 positions shown · non-contrast
Comparison: [DATE].  [DATE].

CLINICAL DATA: Shortness of breath.  Weight gain.  CHF.

EXAM:
CHEST - 2 VIEW

[w chest pa]
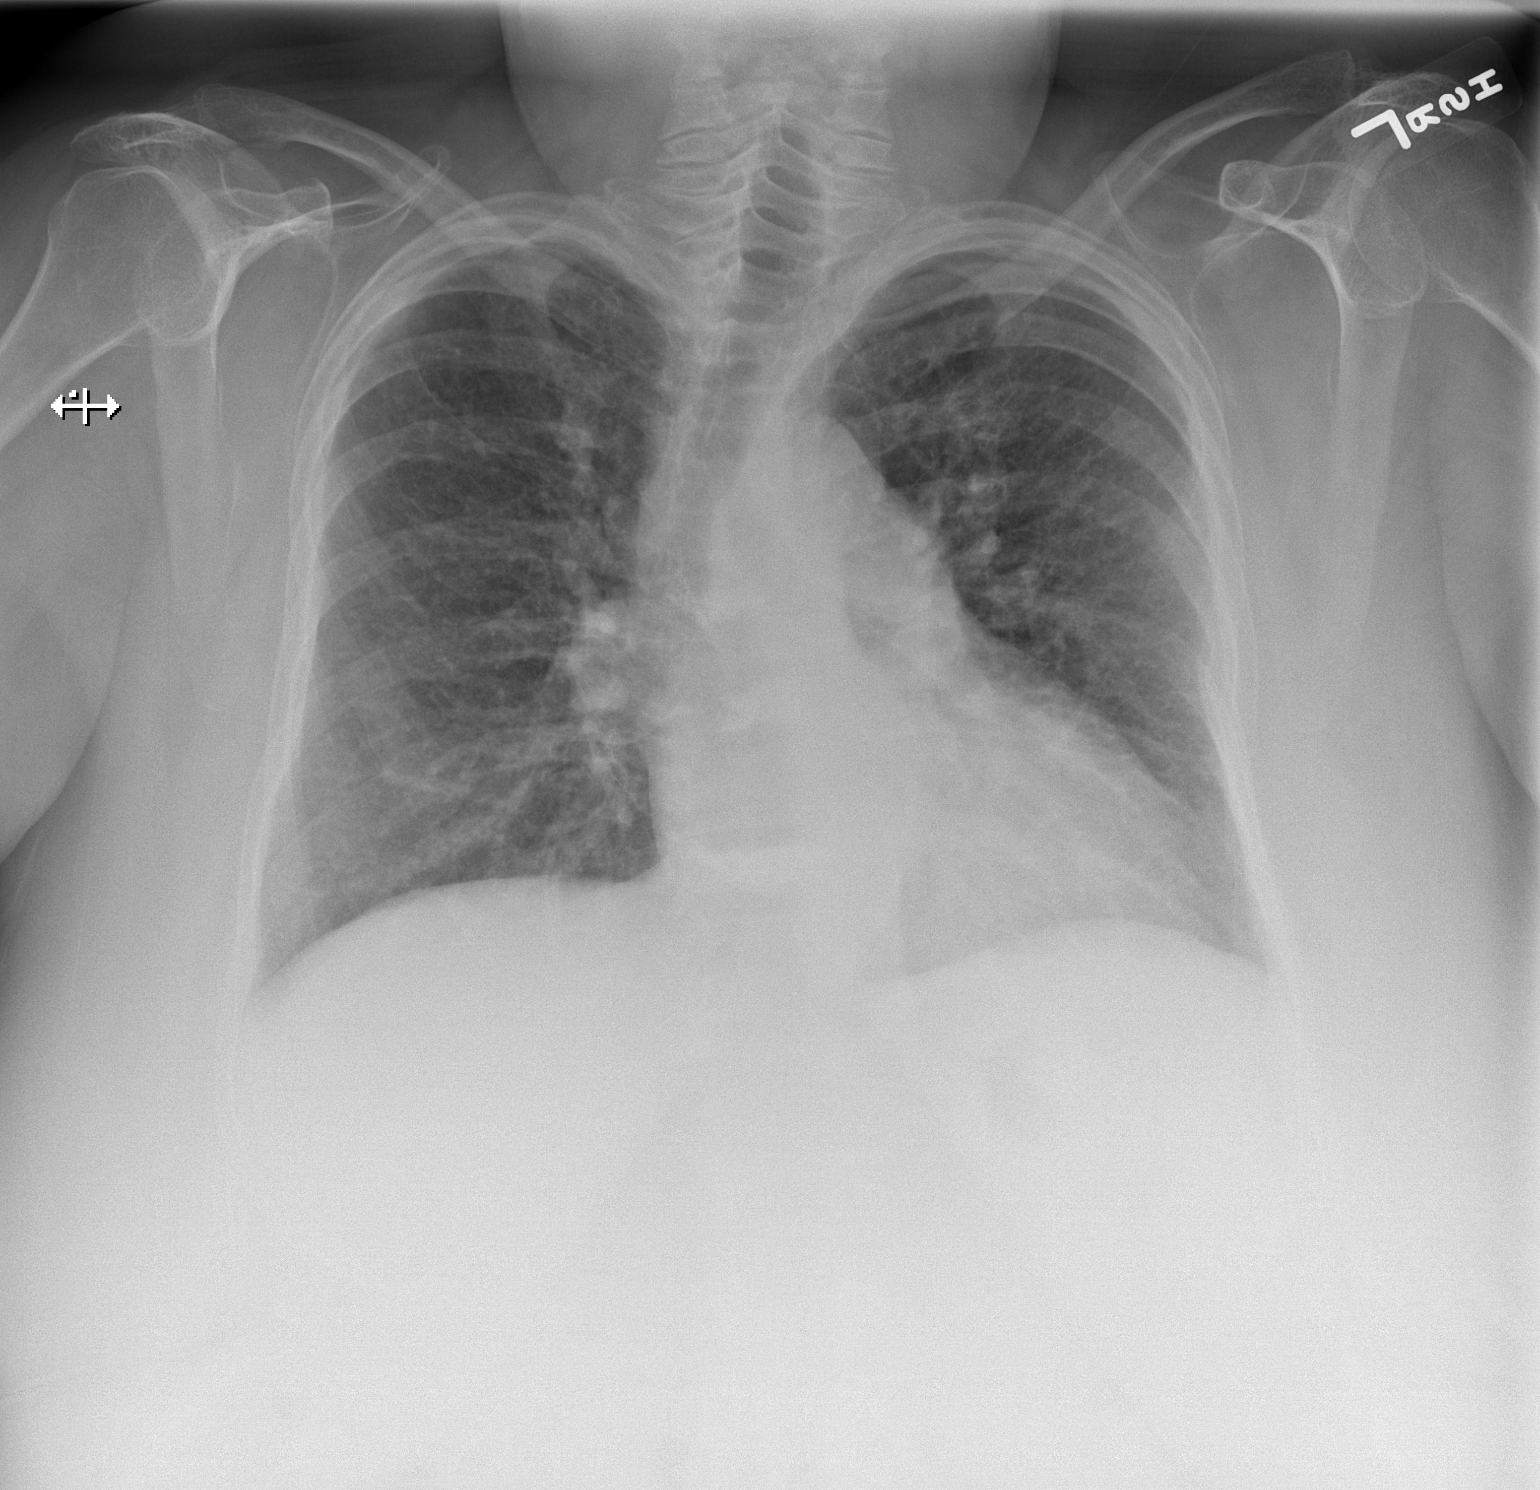

[w chest lat]
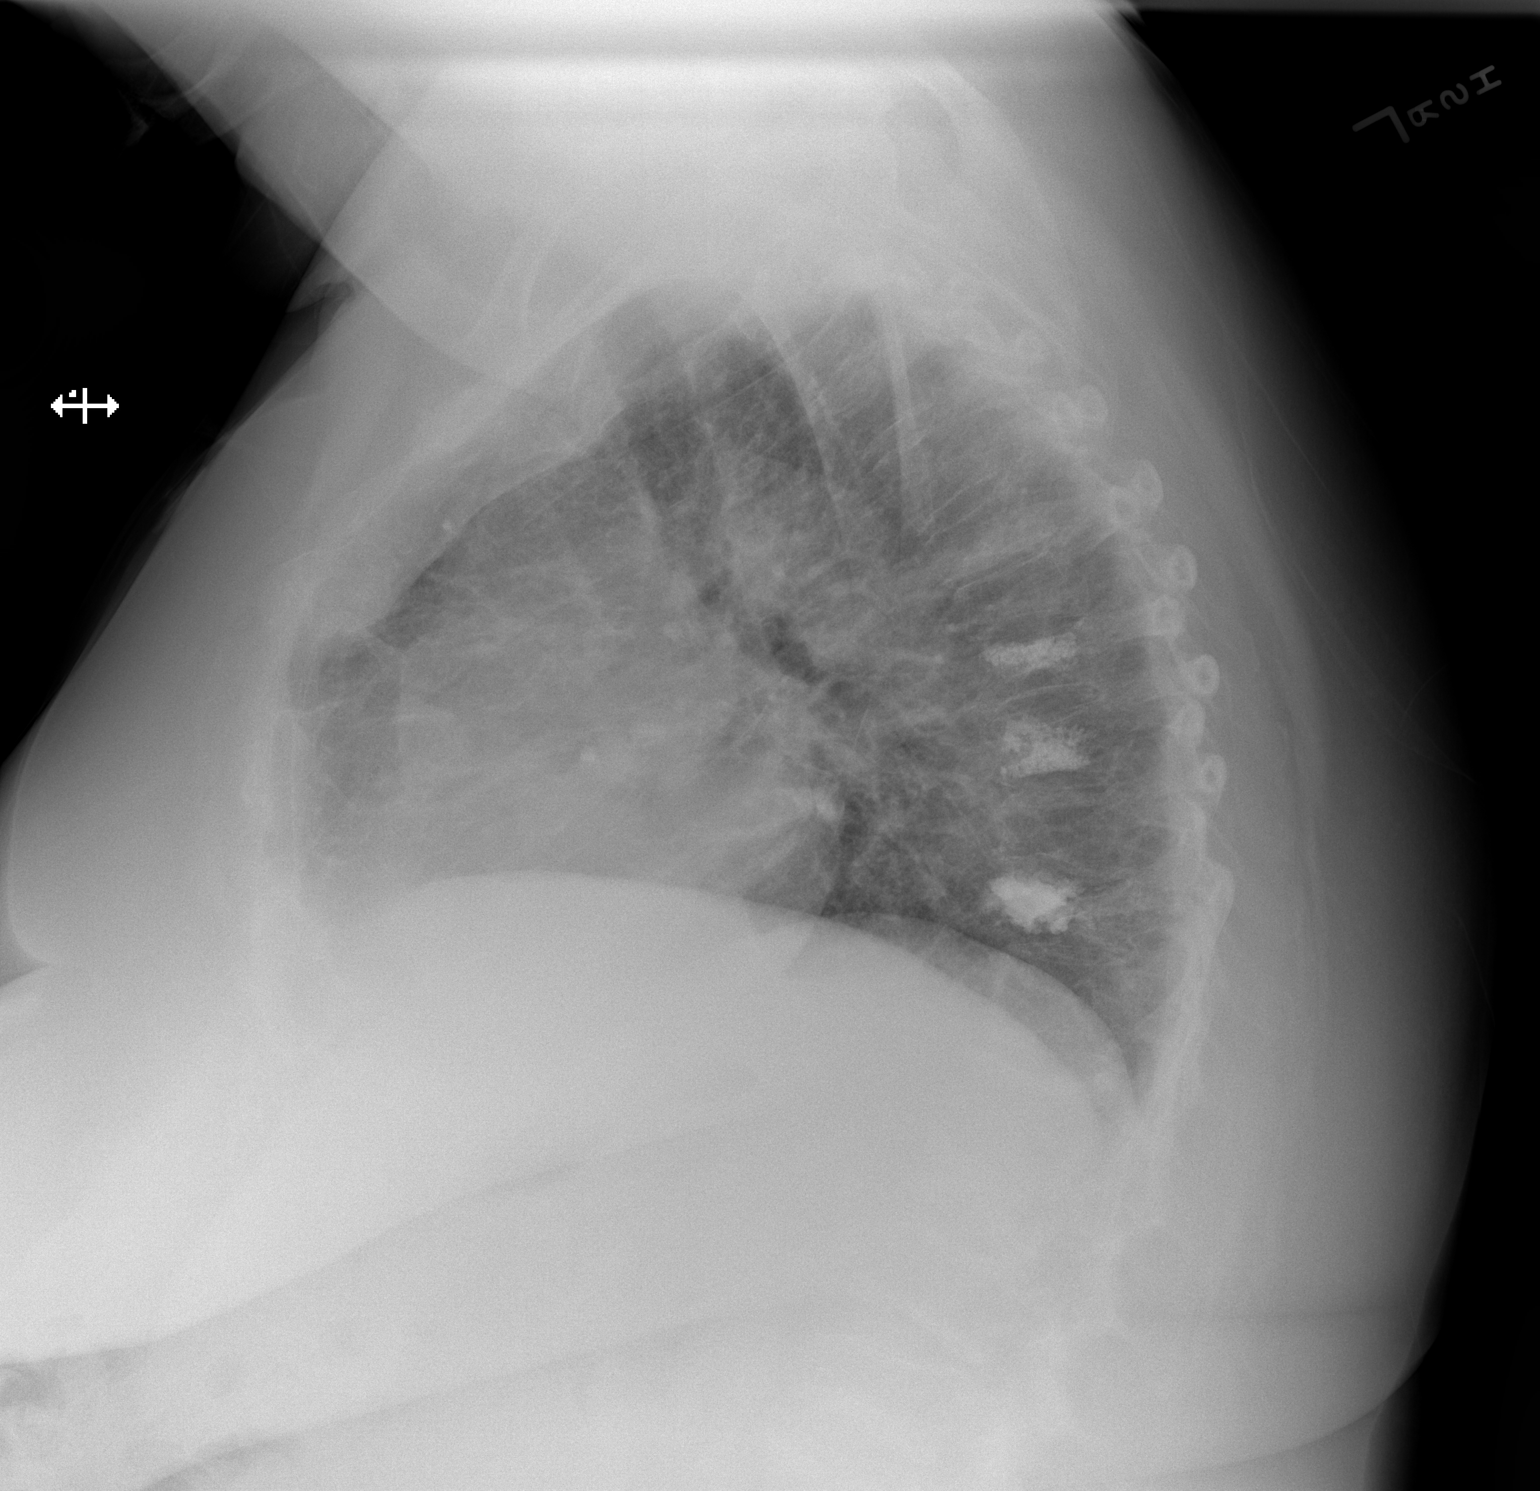

[2 of 2 positions shown; findings below may reference images not displayed]

FINDINGS: Mediastinum hilar structures normal. Mild cardiomegaly with mild
pulmonary venous congestion. Mild bilateral from interstitial
prominence is unchanged and may be related chronic interstitial lung
disease. Low lung volumes with mild bibasilar atelectasis. No
pleural effusion or pneumothorax. Prior thoracic vertebroplasties.
Diffuse osteopenia degenerative change with multiple thoracic
vertebral body compression fractures again noted. Similar findings
noted on prior exams.
IMPRESSION: 1.  Low lung volumes with mild bibasilar atelectasis.

2. Cardiomegaly with mild pulmonary venous congestion. Mild
bilateral interstitial prominence is stable and may be related
chronic interstitial lung disease.

## 2018-06-25 MED ORDER — FUROSEMIDE 10 MG/ML IJ SOLN
40.0000 mg | Freq: Once | INTRAMUSCULAR | Status: AC
  Administered 2018-06-25: 40 mg via INTRAVENOUS
  Filled 2018-06-25: qty 4

## 2018-06-25 NOTE — ED Notes (Signed)
Pt ambulated around unit with walker off of oxygen without difficulty. Pt's spo2 remained in mid 90s on RA and HR in the 90s. Pt states "I feel better" PA notified. Pt to be d/c home.

## 2018-06-25 NOTE — ED Notes (Signed)
Pt verbalized understanding to take 800mg  lasix for the next 3 days and already has follow up appointment with her pcp Monday. VSS, NAD.

## 2018-06-25 NOTE — ED Provider Notes (Signed)
[facility] EMERGENCY DEPARTMENT Provider Note   CSN: [id number] Arrival date & time: 06/25/18  1134     History   Chief Complaint Chief Complaint  Patient presents with  . Congestive Heart Failure    HPI Theresa Fernandez is a 62 y.o. female who presents with shortness of breath and swelling.  Past medical history significant for COPD, diastolic CHF EF 60 to 32%, obesity, CKD, diabetes, hypertension, hyperlipidemia, former smoker.  Patient states that she felt well after her her recent hospitalization.  She was treated for a COPD and CHF exacerbation.  She has been taking her medicines as prescribed.  Since yesterday she has had abdominal swelling and bilateral lower leg edema.  She also reports worsening shortness of breath and wheezing.  She denies any fever, cough, significant chest pain.  States she has shooting pains in her chest like "ice picks" at times.  She feels her are very similar as to a couple of weeks ago.  She reports a 6 pound weight gain overnight.  She has been adhering to a low-sodium diet and fluid restriction.  HPI  Past Medical History:  Diagnosis Date  . Allergy   . Anxiety   . Arthritis   . COPD (chronic obstructive pulmonary disease) ([place])   . Depression   . Diabetes mellitus without complication ([place])   . HTN (hypertension) 01/12/2013    Patient Active Problem List   Diagnosis Date Noted  . Tobacco abuse 06/22/2018  . Former smoker 06/22/2018  . Acute on chronic heart failure with preserved ejection fraction (HFpEF) ([place]) 06/09/2018  . CKD (chronic kidney disease) stage 3, GFR 30-59 ml/min (HCC) 06/09/2018  . Chronic obstructive pulmonary disease ([place]) 06/06/2018  . Cellulitis of abdominal wall 06/06/2018  . Spinal compression fracture ([place])   . Knee pain 03/19/2013  . DM2 (diabetes mellitus, type 2) ([place]) 02/11/2013  . Muscle cramps 02/11/2013  . Dyslipidemia 02/11/2013  . Healthcare maintenance 02/11/2013  . HTN (hypertension)  01/12/2013  . Depression 01/12/2013    Past Surgical History:  Procedure Laterality Date  . TUBAL LIGATION       OB History   None      Home Medications    Prior to Admission medications   Medication Sig Start Date End Date Taking? Authorizing Provider  albuterol (PROVENTIL HFA;VENTOLIN HFA) 108 (90 Base) MCG/ACT inhaler Inhale 2 puffs into the lungs every 6 (six) hours as needed for wheezing or shortness of breath (wheezing). 06/22/18   Icard, Leory Plowman L, DO  albuterol (PROVENTIL) (2.5 MG/3ML) 0.083% nebulizer solution Take 2.5 mg by nebulization every 6 (six) hours as needed for wheezing or shortness of breath (wheezing).     [provider]  amphetamine-dextroamphetamine (ADDERALL) 20 MG tablet Take 1 tablet (20 mg total) by mouth 3 (three) times daily. 01/12/13   Theodis Blaze, MD  busPIRone (BUSPAR) 15 MG tablet Take 15 mg by mouth 2 (two) times daily.     [provider]  famotidine (PEPCID) 20 MG tablet Take 1 tablet (20 mg total) by mouth 2 (two) times daily. 06/12/18   Kayleen Memos, DO  furosemide (LASIX) 40 MG tablet Take 1 tablet (40 mg total) by mouth daily. 06/12/18   Kayleen Memos, DO  HYDROcodone-acetaminophen (NORCO) 7.5-325 MG tablet Take 1 tablet by mouth every 6 (six) hours as needed. 05/11/15   [provider]  hydrOXYzine (ATARAX/VISTARIL) 25 MG tablet Take 25 mg by mouth 3 (three) times daily. 04/21/18  [provider]  lamoTRIgine (LAMICTAL) 25 MG tablet Take 25 mg by mouth daily. 04/21/18   [provider]  magnesium gluconate (MAGONATE) 500 MG tablet Take 1 tablet (500 mg total) by mouth 2 (two) times daily. 01/12/13   Theodis Blaze, MD  methocarbamol (ROBAXIN) 750 MG tablet Take 750 mg by mouth daily. 05/29/18   [provider]  omeprazole (PRILOSEC) 40 MG capsule Take 40 mg by mouth daily.    [provider]  pioglitazone (ACTOS) 30 MG tablet Take 1 tablet (30 mg total) by mouth daily. 02/11/13    Johnson, Clanford L, MD  potassium chloride SA (K-DUR,KLOR-CON) 20 MEQ tablet Take 1 tablet (20 mEq total) by mouth daily. 06/12/18   Kayleen Memos, DO  pravastatin (PRAVACHOL) 20 MG tablet Take 1 tablet (20 mg total) by mouth daily at 6 PM. 06/12/18   Kayleen Memos, DO  pregabalin (LYRICA) 150 MG capsule Take 1 capsule (150 mg total) by mouth at bedtime. Patient taking differently: Take 100 mg by mouth 2 (two) times daily.  02/11/13   Johnson, Clanford L, MD  sertraline (ZOLOFT) 100 MG tablet Take 100 mg by mouth 2 (two) times daily.     [provider]  Tiotropium Bromide Monohydrate (SPIRIVA RESPIMAT) 1.25 MCG/ACT AERS Inhale 2 puffs into the lungs daily. 06/22/18   Garner Nash, DO  Tiotropium Bromide Monohydrate (SPIRIVA RESPIMAT) 1.25 MCG/ACT AERS Inhale 2 puffs into the lungs daily. 06/22/18   Garner Nash, DO    Family History Family History  Problem Relation Age of Onset  . Diabetes Mother   . Alcohol abuse Father   . Diabetes Father     Social History Social History   Tobacco Use  . Smoking status: Former Smoker    Packs/day: 1.00    Years: 50.00    Pack years: 50.00    Types: Cigarettes    Last attempt to quit: 05/30/2018    Years since quitting: 0.0  . Smokeless tobacco: Never Used  Substance Use Topics  . Alcohol use: No  . Drug use: No     Allergies   Ceftriaxone; Topamax [topiramate]; and Voltaren [diclofenac sodium]   Review of Systems Review of Systems  Constitutional: Negative for fever.  Respiratory: Positive for shortness of breath and wheezing. Negative for cough.   Cardiovascular: Positive for leg swelling. Negative for chest pain.  Gastrointestinal: Positive for abdominal distention. Negative for abdominal pain, nausea and vomiting.  Neurological: Negative for syncope and light-headedness.  All other systems reviewed and are negative.    Physical Exam Updated Vital Signs BP 112/61 (BP Location: Right Arm)   Pulse 69   Temp  98.1 F (36.7 C) (Oral)   Resp 20   Ht 5\' 8"  (1.727 m)   Wt 116.6 kg   SpO2 95%   BMI 39.08 kg/m   Physical Exam  Constitutional: She is oriented to person, place, and time. She appears well-developed and well-nourished. No distress.  Obese. Calm and cooperative. Moderately tachypneic  HENT:  Head: Normocephalic and atraumatic.  Eyes: Pupils are equal, round, and reactive to light. Conjunctivae are normal. Right eye exhibits no discharge. Left eye exhibits no discharge. No scleral icterus.  Neck: Normal range of motion.  Cardiovascular: Normal rate and regular rhythm.  Pulmonary/Chest: Effort normal and breath sounds normal. No respiratory distress.  Abdominal: She exhibits no distension.  Neurological: She is alert and oriented to person, place, and time.  Skin: Skin is warm and  dry.  Psychiatric: She has a normal mood and affect. Her behavior is normal.  Nursing note and vitals reviewed.    ED Treatments / Results  Labs (all labs ordered are listed, but only abnormal results are displayed) Labs Reviewed  BASIC METABOLIC PANEL - Abnormal; Notable for the following components:      Result Value   Creatinine, Ser 1.36 (*)    GFR calc non Af Amer 41 (*)    GFR calc Af Amer 48 (*)    All other components within normal limits  CBC - Abnormal; Notable for the following components:   Hemoglobin 11.6 (*)    MCHC 29.4 (*)    All other components within normal limits  BRAIN NATRIURETIC PEPTIDE  I-STAT TROPONIN, ED    EKG EKG Interpretation  Date/Time:  Thursday June 25 2018 12:53:33 EST Ventricular Rate:  69 PR Interval:  170 QRS Duration: 86 QT Interval:  396 QTC Calculation: 424 R Axis:   33 Text Interpretation:  Normal sinus rhythm Low voltage QRS Cannot rule out Anterior infarct , age undetermined Abnormal ECG Confirmed by Virgel Manifold [phone]) on 06/25/2018 11:59:56 AM   Radiology Dg Chest 2 View  Result Date: 06/25/2018 CLINICAL DATA:  Shortness of breath.   Weight gain.  CHF. EXAM: CHEST - 2 VIEW COMPARISON:  06/12/2018.  01/09/2015. FINDINGS: Mediastinum hilar structures normal. Mild cardiomegaly with mild pulmonary venous congestion. Mild bilateral from interstitial prominence is unchanged and may be related chronic interstitial lung disease. Low lung volumes with mild bibasilar atelectasis. No pleural effusion or pneumothorax. Prior thoracic vertebroplasties. Diffuse osteopenia degenerative change with multiple thoracic vertebral body compression fractures again noted. Similar findings noted on prior exams. IMPRESSION: 1.  Low lung volumes with mild bibasilar atelectasis. 2. Cardiomegaly with mild pulmonary venous congestion. Mild bilateral interstitial prominence is stable and may be related chronic interstitial lung disease. Electronically Signed   By: Marcello Moores  Register   On: 06/25/2018 12:39    Procedures Procedures (including critical care time)  Medications Ordered in ED Medications  furosemide (LASIX) injection 40 mg (40 mg Intravenous Given 06/25/18 1230)     Initial Impression / Assessment and Plan / ED Course  I have reviewed the triage vital signs and the nursing notes.  Pertinent labs & imaging results that were available during my care of the patient were reviewed by me and considered in my medical decision making (see chart for details).  62 year old female presents with SOB, wheezing, swelling for the past day. She has been taking her PO Lasix as prescribed but not having a lot of output. Her vitals are normal. EKG is SR. Labs are normal. BNP and troponin are normal. She was given 40mg  IV Lasix and has had adequate output here. Ambulatory sats were obtained and sats were normal and she feels her breathing is at baseline. She was encouraged to adhere to fluid restriction and low sodium diet. She was advised to increase her Lasix until Monday when she has a f/u with her PCP.  Final Clinical Impressions(s) / ED Diagnoses   Final  diagnoses:  Acute on chronic congestive heart failure, unspecified heart failure type [facility])    ED Discharge Orders    None       Recardo Evangelist, PA-C 06/25/18 1524    Veryl Speak, MD 06/25/18 [phone]

## 2018-06-25 NOTE — Discharge Instructions (Addendum)
Please increase your Lasix to 80mg  for the next 3 days. Follow up with your doctor Return if worsening

## 2018-06-25 NOTE — ED Triage Notes (Signed)
Patient to ED c/o 6lb. Weight gain since last night as well as shortness of breath. Recently here 2-3 weeks ago for similar. She adds new sharp shooting pains throughout her abdomen. Denies chest pain, dizziness, N/V. Resp e/u at rest.

## 2018-07-20 ENCOUNTER — Ambulatory Visit (INDEPENDENT_AMBULATORY_CARE_PROVIDER_SITE_OTHER): Admitting: Pulmonary Disease

## 2018-07-20 ENCOUNTER — Ambulatory Visit (INDEPENDENT_AMBULATORY_CARE_PROVIDER_SITE_OTHER): Admitting: Primary Care

## 2018-07-20 ENCOUNTER — Encounter: Payer: Self-pay | Admitting: Primary Care

## 2018-07-20 DIAGNOSIS — J849 Interstitial pulmonary disease, unspecified: Secondary | ICD-10-CM

## 2018-07-20 DIAGNOSIS — I5033 Acute on chronic diastolic (congestive) heart failure: Secondary | ICD-10-CM

## 2018-07-20 DIAGNOSIS — J449 Chronic obstructive pulmonary disease, unspecified: Secondary | ICD-10-CM | POA: Diagnosis not present

## 2018-07-20 LAB — PULMONARY FUNCTION TEST
DL/VA % pred: 92 %
DL/VA: 4.25 ml/min/mmHg/L
DLCO UNC % PRED: 55 %
DLCO cor % pred: 59 %
DLCO cor: 13.13 ml/min/mmHg
DLCO unc: 12.34 ml/min/mmHg
FEF 25-75 PRE: 1.11 L/s
FEF 25-75 Post: 1.59 L/sec
FEF2575-%Change-Post: 43 %
FEF2575-%Pred-Post: 74 %
FEF2575-%Pred-Pre: 51 %
FEV1-%Change-Post: 9 %
FEV1-%Pred-Post: 64 %
FEV1-%Pred-Pre: 58 %
FEV1-Post: 1.5 L
FEV1-Pre: 1.38 L
FEV1FVC-%Change-Post: 4 %
FEV1FVC-%Pred-Pre: 99 %
FEV6-%Change-Post: 5 %
FEV6-%Pred-Post: 63 %
FEV6-%Pred-Pre: 60 %
FEV6-Post: 1.87 L
FEV6-Pre: 1.77 L
FEV6FVC-%Change-Post: 0 %
FEV6FVC-%PRED-PRE: 103 %
FEV6FVC-%Pred-Post: 103 %
FVC-%Change-Post: 4 %
FVC-%Pred-Post: 61 %
FVC-%Pred-Pre: 58 %
FVC-Post: 1.87 L
FVC-Pre: 1.78 L
Post FEV1/FVC ratio: 80 %
Post FEV6/FVC ratio: 100 %
Pre FEV1/FVC ratio: 77 %
Pre FEV6/FVC Ratio: 99 %
RV % PRED: 96 %
RV: 1.89 L
TLC % pred: 79 %
TLC: 3.85 L

## 2018-07-20 LAB — NITRIC OXIDE: FeNO level (ppb): 5

## 2018-07-20 MED ORDER — TIOTROPIUM BROMIDE MONOHYDRATE 1.25 MCG/ACT IN AERS: 2.0000 | INHALATION_SPRAY | Freq: Every day | RESPIRATORY_TRACT | 2 refills | Status: DC

## 2018-07-20 MED ORDER — BUDESONIDE-FORMOTEROL FUMARATE 80-4.5 MCG/ACT IN AERO: 2.0000 | INHALATION_SPRAY | Freq: Two times a day (BID) | RESPIRATORY_TRACT | 2 refills | Status: DC

## 2018-07-20 NOTE — Patient Instructions (Addendum)
Pulmonary function testing showed restriction, no overt obstruction. Mid-flow reversibility. Decreased diffusion capacity.  RX: Adding Symbicort 80- take 2 puffs twice daily (sample given) Continue Spiriva- 2 puffs daily (sample given)  Orders: HRCT re: decreased diffusion capacity, suspected ILD and emphysema   Follow-up: 4 weeks with Dr. Valeta Harms or NP

## 2018-07-20 NOTE — Progress Notes (Signed)
@Patient  ID: Theresa Fernandez, female    DOB: 04/19/1956, 62 y.o.   MRN: [id number]  Chief Complaint  Patient presents with  . Follow-up    PFT, SOB with exertrion, occasional wheezing    Referring provider: [facility]  HPI: 62 year old female, former smoker quit 05/2018 (50 pack year hx). PMH significant for COPD, acute on chronic heart failure with preserved EF, Hypertension, DM 2, CKD stage 3. Patient of Dr. Valeta Harms, last seen on 06/22/18. Recently seen in ER for COPD exacerbation. Most recent BNP 63 on 06/25/18.   07/26/2018 Patient presents today for 1 month follow-up with PFTs. Hx sleep apnea, not on cpap. Patient reports no change in breathing with addition of Spiriva. PFTs showed restriction, no overt obstruction. FENO normal. Complains of abdominal swelling. She gained 5-6 lbs over the holidays.   PFTs 07/26/2018 - FVC 1.87 (61%), FEV1 1.5 (64%), RATIO 80, +mid-flow reversibility, moderate DLCO defect   Allergies  Allergen Reactions  . Ceftriaxone Hives and Rash    After second dose on 06/07/2018,  . Topamax [Topiramate]   . Voltaren [Diclofenac Sodium]     migraine    Immunization History  Administered Date(s) Administered  . Influenza,inj,Quad PF,6+ Mos 06/08/2018  . Pneumococcal Polysaccharide-23 06/08/2018    Past Medical History:  Diagnosis Date  . Allergy   . Anxiety   . Arthritis   . COPD (chronic obstructive pulmonary disease) ([place])   . Depression   . Diabetes mellitus without complication ([place])   . HTN (hypertension) 01/12/2013    Tobacco History: Social History   Tobacco Use  Smoking Status Former Smoker  . Packs/day: 1.00  . Years: 50.00  . Pack years: 50.00  . Types: Cigarettes  . Last attempt to quit: 05/30/2018  . Years since quitting: 0.1  Smokeless Tobacco Never Used   Counseling given: Not Answered   Outpatient Medications Prior to Visit  Medication Sig Dispense Refill  . albuterol (PROVENTIL HFA;VENTOLIN HFA) 108 (90 Base)  MCG/ACT inhaler Inhale 2 puffs into the lungs every 6 (six) hours as needed for wheezing or shortness of breath (wheezing). 1 Inhaler 5  . albuterol (PROVENTIL) (2.5 MG/3ML) 0.083% nebulizer solution Take 2.5 mg by nebulization every 6 (six) hours as needed for wheezing or shortness of breath (wheezing).     Marland Kitchen amphetamine-dextroamphetamine (ADDERALL) 20 MG tablet Take 1 tablet (20 mg total) by mouth 3 (three) times daily. 90 tablet 0  . busPIRone (BUSPAR) 15 MG tablet Take 15 mg by mouth 2 (two) times daily.     . famotidine (PEPCID) 20 MG tablet Take 1 tablet (20 mg total) by mouth 2 (two) times daily. 30 tablet 0  . furosemide (LASIX) 40 MG tablet Take 1 tablet (40 mg total) by mouth daily. 30 tablet 0  . hydrOXYzine (ATARAX/VISTARIL) 25 MG tablet Take 25 mg by mouth 3 (three) times daily.    Marland Kitchen lamoTRIgine (LAMICTAL) 25 MG tablet Take 25 mg by mouth daily.    . magnesium gluconate (MAGONATE) 500 MG tablet Take 1 tablet (500 mg total) by mouth 2 (two) times daily. 60 tablet 3  . methocarbamol (ROBAXIN) 750 MG tablet Take 750 mg by mouth daily.    Marland Kitchen omeprazole (PRILOSEC) 40 MG capsule Take 40 mg by mouth daily.    Marland Kitchen oxyCODONE-acetaminophen (PERCOCET) 10-325 MG tablet Take 1 tablet by mouth every 6 (six) hours as needed for pain.    . pioglitazone (ACTOS) 30 MG tablet Take 1 tablet (30 mg total)  by mouth daily. 30 tablet 3  . potassium chloride SA (K-DUR,KLOR-CON) 20 MEQ tablet Take 1 tablet (20 mEq total) by mouth daily. 30 tablet 0  . pravastatin (PRAVACHOL) 20 MG tablet Take 1 tablet (20 mg total) by mouth daily at 6 PM. 30 tablet 0  . pregabalin (LYRICA) 150 MG capsule Take 1 capsule (150 mg total) by mouth at bedtime. (Patient taking differently: Take 100 mg by mouth 2 (two) times daily. ) 30 capsule 3  . sertraline (ZOLOFT) 100 MG tablet Take 100 mg by mouth 2 (two) times daily.     . Tiotropium Bromide Monohydrate (SPIRIVA RESPIMAT) 1.25 MCG/ACT AERS Inhale 2 puffs into the lungs daily. 1  Inhaler 0  . Tiotropium Bromide Monohydrate (SPIRIVA RESPIMAT) 1.25 MCG/ACT AERS Inhale 2 puffs into the lungs daily. 1 Inhaler 0  . HYDROcodone-acetaminophen (NORCO) 7.5-325 MG tablet Take 1 tablet by mouth every 6 (six) hours as needed.  0   No facility-administered medications prior to visit.       Review of Systems  Review of Systems  Constitutional: Negative.   Respiratory: Positive for shortness of breath.   Gastrointestinal: Negative for abdominal pain and constipation.       Abdominal swelling     Physical Exam  BP 108/64 (BP Location: Left Arm, Cuff Size: Large)   Pulse 76   Temp 97.8 F (36.6 C)   Ht 5\' 8"  (1.727 m)   Wt 257 lb (116.6 kg)   SpO2 92%   BMI 39.08 kg/m  Physical Exam Constitutional:      Appearance: Normal appearance. She is obese. She is not ill-appearing.  HENT:     Head: Normocephalic and atraumatic.     Mouth/Throat:     Mouth: Mucous membranes are moist.     Pharynx: Oropharynx is clear.  Eyes:     Extraocular Movements: Extraocular movements intact.     Pupils: Pupils are equal, round, and reactive to light.  Neck:     Musculoskeletal: Normal range of motion and neck supple.  Cardiovascular:     Rate and Rhythm: Normal rate and regular rhythm.  Pulmonary:     Effort: Pulmonary effort is normal. No respiratory distress.     Breath sounds: Wheezing present. No rhonchi.     Comments: Crackles to bilateral bases and faint exp wheeze t/o Musculoskeletal: Normal range of motion.  Skin:    General: Skin is warm and dry.  Neurological:     General: No focal deficit present.     Mental Status: She is alert and oriented to person, place, and time.  Psychiatric:        Mood and Affect: Mood normal.        Behavior: Behavior normal.        Thought Content: Thought content normal.        Judgment: Judgment normal.      Lab Results:  CBC    Component Value Date/Time   WBC 10.0 06/25/2018 1227   RBC 3.95 06/25/2018 1227   HGB 11.6  (L) 06/25/2018 1227   HCT 39.4 06/25/2018 1227   PLT 247 06/25/2018 1227   MCV 99.7 06/25/2018 1227   MCH 29.4 06/25/2018 1227   MCHC 29.4 (L) 06/25/2018 1227   RDW 13.5 06/25/2018 1227   LYMPHSABS 1.8 06/06/2018 1458   MONOABS 0.6 06/06/2018 1458   EOSABS 0.3 06/06/2018 1458   BASOSABS 0.1 06/06/2018 1458    BMET    Component Value Date/Time   NA  135 07/20/2018 1658   K 4.3 07/20/2018 1658   CL 94 (L) 07/20/2018 1658   CO2 33 (H) 07/20/2018 1658   GLUCOSE 93 07/20/2018 1658   BUN 35 (H) 07/20/2018 1658   CREATININE 1.71 (H) 07/20/2018 1658   CREATININE 0.89 02/11/2013 0922   CALCIUM 9.7 07/20/2018 1658   GFRNONAA 41 (L) 06/25/2018 1227   GFRNONAA 72 02/11/2013 0922   GFRAA 48 (L) 06/25/2018 1227   GFRAA 83 02/11/2013 0922    BNP    Component Value Date/Time   BNP 63.8 06/25/2018 1227    ProBNP    Component Value Date/Time   PROBNP 38.0 07/20/2018 1658    Imaging: No results found.   Assessment & Plan:   Chronic obstructive pulmonary disease ([place]) - PFTs showed restriction, no obstruction. Mid-flow reversibility, moderate diffusion defect  - Continue Spiriva for now, may discontinue in the furtuer - Add Symbicort 80 two puffs BID   Interstitial pulmonary disease ([place]) - CXR on 06/12/18 showed mild bilateral interstitial prominence is stable and may be related chronic interstitial lung disease - Order HRCT re: decreased diffusion capacity, suspected emphysema and ILD   Acute on chronic heart failure with preserved ejection fraction (HFpEF) ([facility]) - Stable, BNP 38 - No signs of fluid overload on exam  - Continues lasix 40mg  daily    Martyn Ehrich, NP 07/26/2018

## 2018-07-20 NOTE — Progress Notes (Signed)
PFT done today. 

## 2018-07-21 LAB — BASIC METABOLIC PANEL
BUN: 35 mg/dL — ABNORMAL HIGH (ref 6–23)
CO2: 33 mEq/L — ABNORMAL HIGH (ref 19–32)
Calcium: 9.7 mg/dL (ref 8.4–10.5)
Chloride: 94 mEq/L — ABNORMAL LOW (ref 96–112)
Creatinine, Ser: 1.71 mg/dL — ABNORMAL HIGH (ref 0.40–1.20)
GFR: 31.96 mL/min — ABNORMAL LOW (ref >60.00)
Glucose, Bld: 93 mg/dL (ref 70–99)
Potassium: 4.3 mEq/L (ref 3.5–5.1)
Sodium: 135 mEq/L (ref 135–145)

## 2018-07-21 LAB — BRAIN NATRIURETIC PEPTIDE: Pro B Natriuretic peptide (BNP): 38 pg/mL (ref 0.0–100.0)

## 2018-07-23 ENCOUNTER — Telehealth: Payer: Self-pay | Admitting: Pulmonary Disease

## 2018-07-23 NOTE — Telephone Encounter (Signed)
Called and spoke with patient she is aware and verbalized understanding. Nothing further needed.  

## 2018-07-26 ENCOUNTER — Encounter: Payer: Self-pay | Admitting: Primary Care

## 2018-07-26 DIAGNOSIS — J849 Interstitial pulmonary disease, unspecified: Secondary | ICD-10-CM | POA: Insufficient documentation

## 2018-07-26 NOTE — Assessment & Plan Note (Signed)
-   Stable, BNP 38 - No signs of fluid overload on exam  - Continues lasix 40mg  daily

## 2018-07-26 NOTE — Progress Notes (Signed)
PCCM: Agree. Thanks for seeing Garner Nash, DO [facility] 07/26/2018 6:46 PM

## 2018-07-26 NOTE — Assessment & Plan Note (Signed)
-   CXR on 06/12/18 showed mild bilateral interstitial prominence is stable and may be related chronic interstitial lung disease - Order HRCT re: decreased diffusion capacity, suspected emphysema and ILD

## 2018-07-26 NOTE — Assessment & Plan Note (Addendum)
-   PFTs showed restriction, no obstruction. Mid-flow reversibility, moderate diffusion defect  - Continue Spiriva for now, may discontinue in the furtuer - Add Symbicort 80 two puffs BID

## 2018-08-04 ENCOUNTER — Ambulatory Visit (INDEPENDENT_AMBULATORY_CARE_PROVIDER_SITE_OTHER)
Admission: RE | Admit: 2018-08-04 | Discharge: 2018-08-04 | Disposition: A | Discharge status: Home / Self Care | Source: Ambulatory Visit | Attending: Primary Care | Admitting: Primary Care

## 2018-08-04 DIAGNOSIS — J849 Interstitial pulmonary disease, unspecified: Secondary | ICD-10-CM

## 2018-08-04 IMAGING — CT CT CHEST HIGH RESOLUTION W/O CM
2 of 5 series · 14 of 36 positions shown, 17 images · non-contrast
Comparison: [DATE] chest radiograph.

CLINICAL DATA: Former smoker. COPD. Emphysema. Clinical concern for
interstitial lung disease.

EXAM:
CT CHEST WITHOUT CONTRAST
TECHNIQUE: Multidetector CT imaging of the chest was performed following the
standard protocol without intravenous contrast. High resolution
imaging of the lungs, as well as inspiratory and expiratory imaging,
was performed.

[Series 4: high resolution · axial · 0.58mm/px · z∈[-271,-73]mm · 11 of 109 slices shown, 14 images]
[im 5/109  mediastinal]
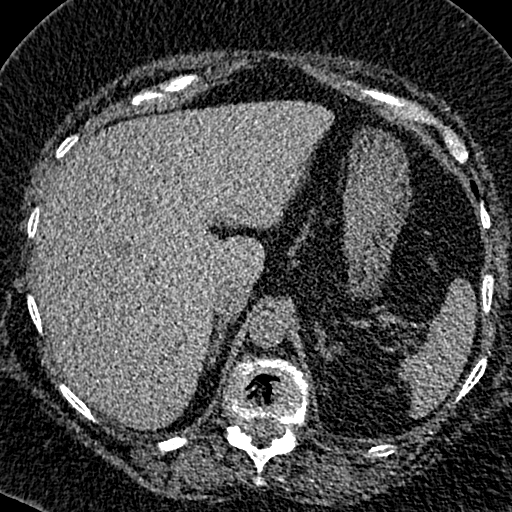
[im 5/109  lung]
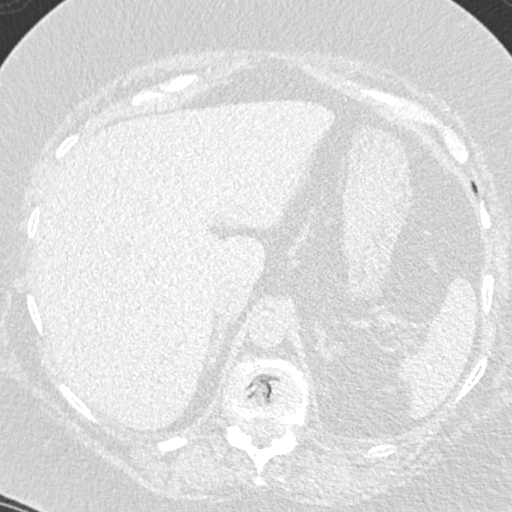
[im 15/109  lung]
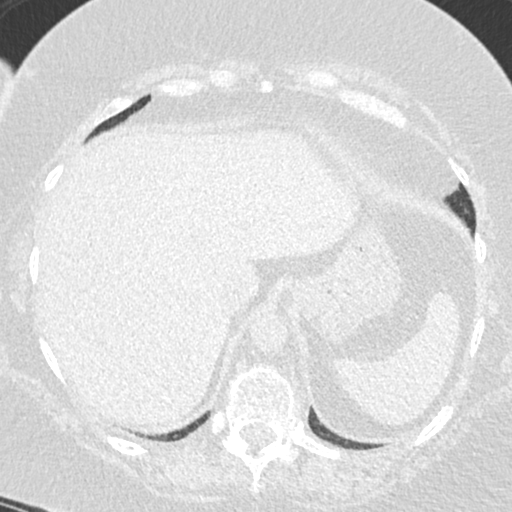
[im 25/109  lung]
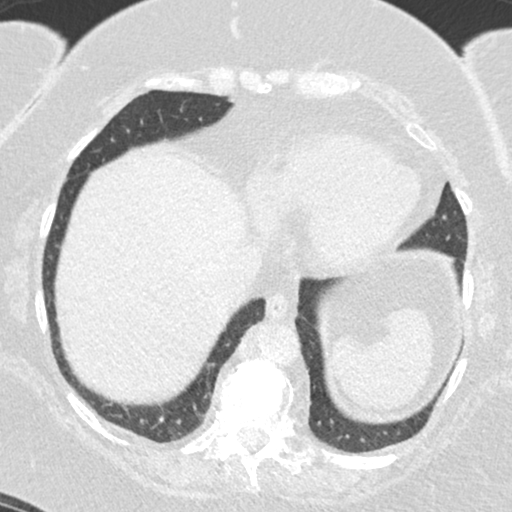
[im 35/109  lung]
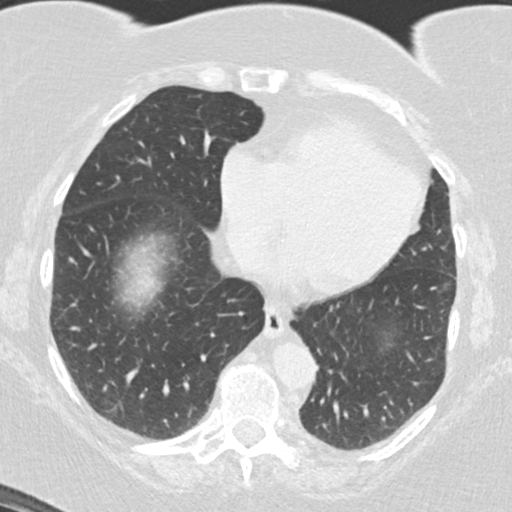
[im 45/109  mediastinal]
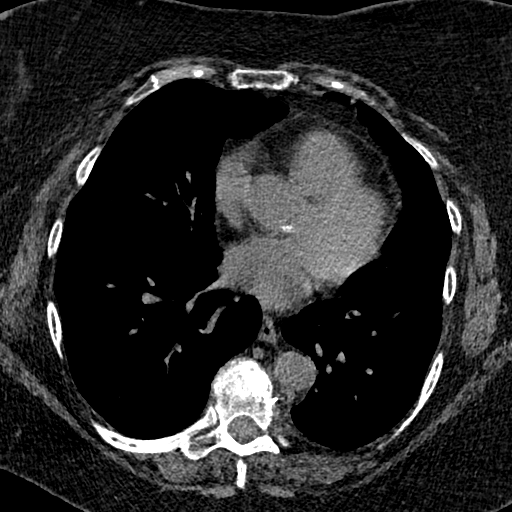
[im 45/109  lung]
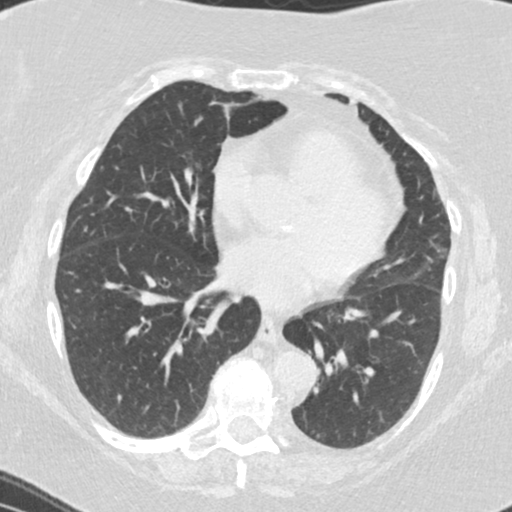
[im 55/109  lung]
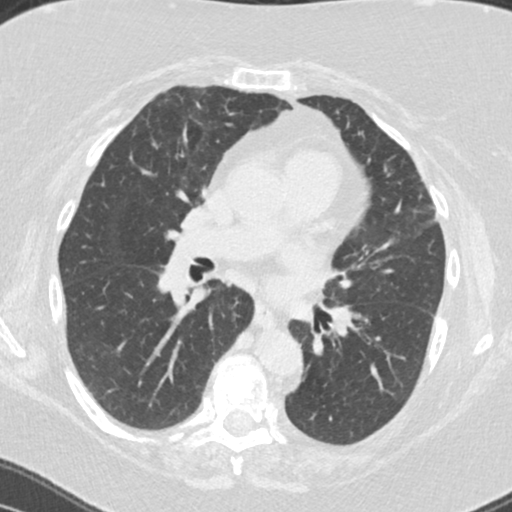
[im 64/109  lung]
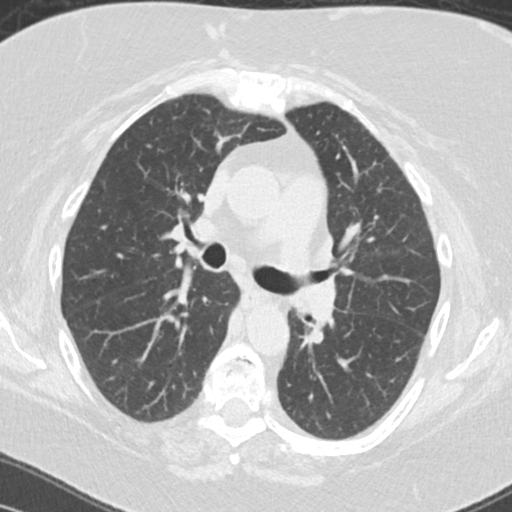
[im 74/109  lung]
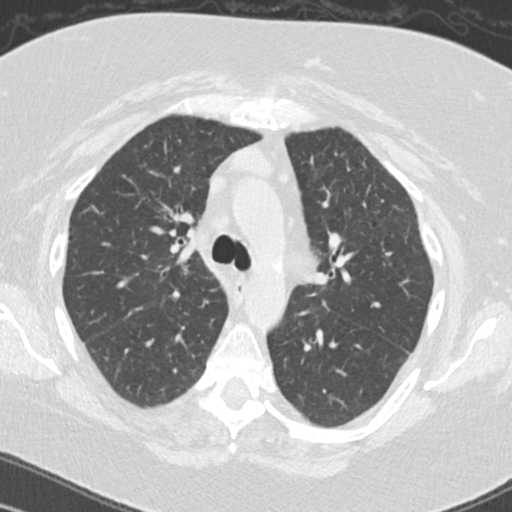
[im 84/109  mediastinal]
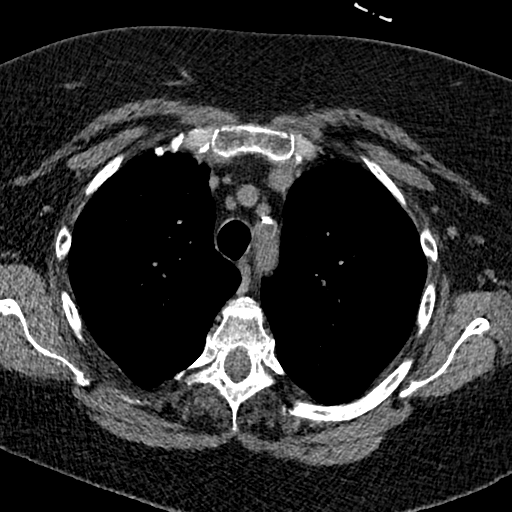
[im 84/109  lung]
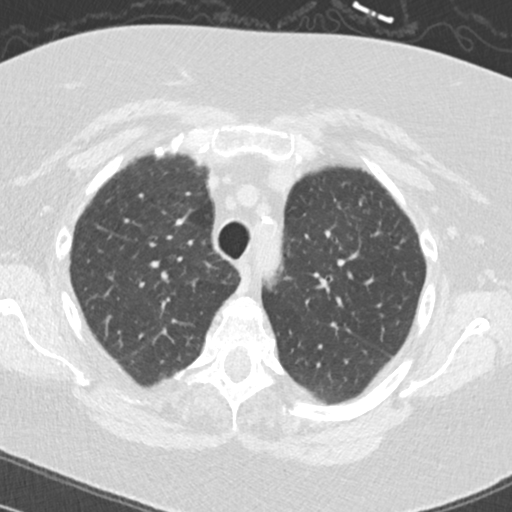
[im 94/109  lung]
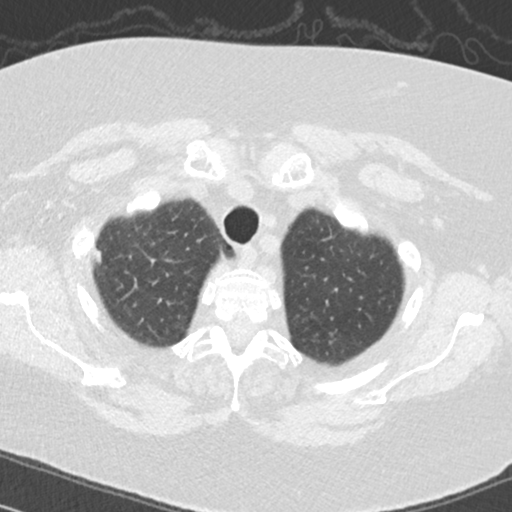
[im 104/109  lung]
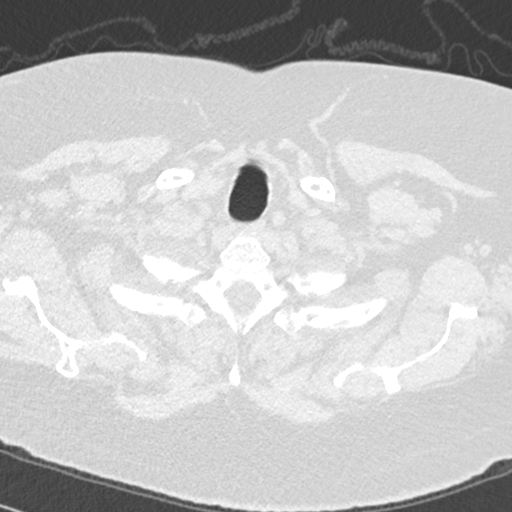

[Series 9: coronal · coronal · 0.48mm/px · 3 of 124 slices shown]
[im 25/124  lung]
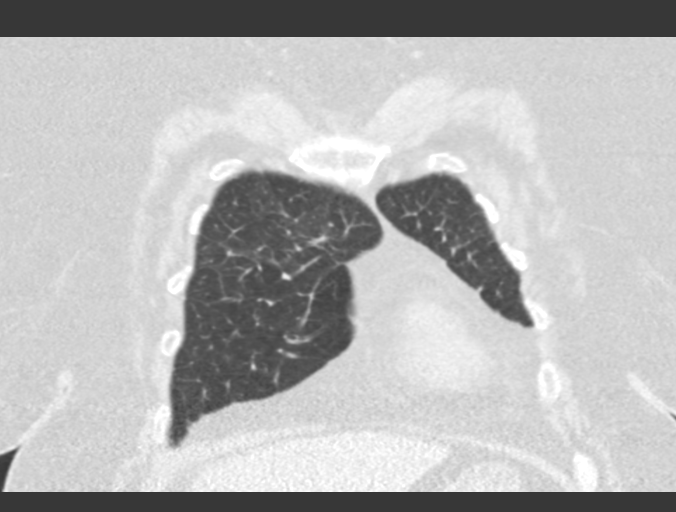
[im 50/124  lung]
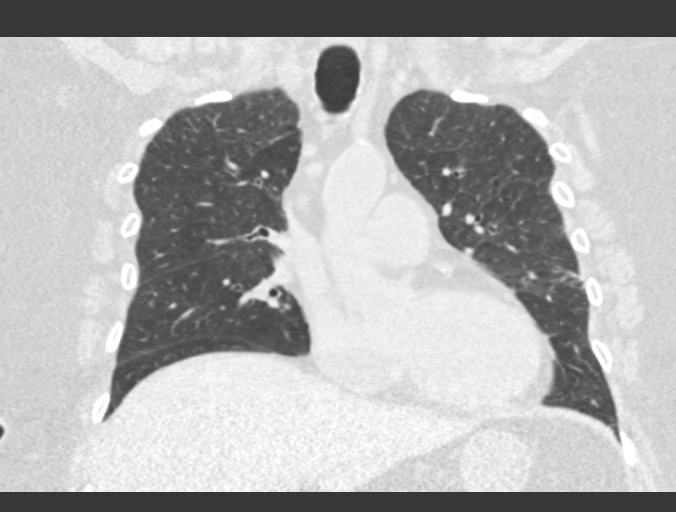
[im 74/124  lung]
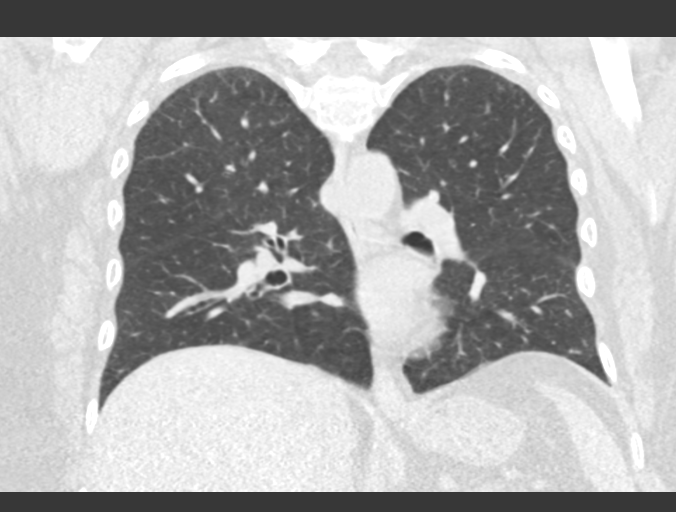

[14 of 36 positions shown; findings below may reference images not displayed]

FINDINGS: Cardiovascular: Normal heart size. No significant pericardial
effusion/thickening. Left anterior descending and right coronary
atherosclerosis. Atherosclerotic nonaneurysmal thoracic aorta.
Normal caliber pulmonary arteries.

Mediastinum/Nodes: No discrete thyroid nodules. Unremarkable
esophagus. No pathologically enlarged axillary, mediastinal or hilar
lymph nodes, noting limited sensitivity for the detection of hilar
adenopathy on this noncontrast study.

Lungs/Pleura: No pneumothorax. No pleural effusion. No acute
consolidative airspace disease, lung masses or significant pulmonary
nodules. Diffuse bronchial wall thickening and mild centrilobular
and paraseptal emphysema. No significant lobular air trapping on the
expiration sequence. There is mild patchy centrilobular ground-glass
micronodularity in both lungs, upper lobe predominant, with
associated scattered mild reticulation in the peribronchovascular
and subpleural lungs. Scattered mild traction bronchiolectasis. No
significant regions of architectural distortion, parenchymal banding
or frank honeycombing. Relative sparing of the lung bases.

Upper abdomen: No acute abnormality.

Musculoskeletal: No aggressive appearing focal osseous lesions.
Healed deformity in the mid sternum. Chronic mild to moderate T3,
T5, T6, T7, T8, T9, T10 and T12 thoracic vertebral compression
fractures with vertebroplasty changes at T7, T8 and T10, unchanged
since [DATE] thoracic spine CT. Moderate thoracic spondylosis.
IMPRESSION: 1. Upper lung predominant mild patchy centrilobular ground-glass
micronodularity, scattered mild reticulation and mild traction
bronchiolectasis. Per [REDACTED], the patient recently quit smoking. As
such, these findings may represent residual smoking related
interstitial lung disease (respiratory bronchiolitis-interstitial
lung disease ([place])). The possibility of chronic hypersensitivity
pneumonitis cannot be excluded, however there is no significant
lobular air trapping to strongly suggest this condition. Findings
are suggestive of an alternative diagnosis (not UIP) per consensus
guidelines: Diagnosis of Idiopathic Pulmonary Fibrosis: An Official
ATS/ERS/JRS/ALAT Clinical Practice Guideline. Am J Respir Crit Care
Med Vol 198, [place]AD[place] 5, [GU]-e[DATE].
2. Mild centrilobular and paraseptal emphysema with diffuse
bronchial wall thickening, compatible with the provided history of
COPD.
3. Two-vessel coronary atherosclerosis.
4. Chronic sternal fracture and multilevel thoracic vertebral
compression fractures.

Aortic Atherosclerosis ([GU]-[GU]) and Emphysema ([GU]-[GU]).

## 2018-08-24 ENCOUNTER — Encounter: Payer: Self-pay | Admitting: Pulmonary Disease

## 2018-08-24 ENCOUNTER — Ambulatory Visit (INDEPENDENT_AMBULATORY_CARE_PROVIDER_SITE_OTHER): Admitting: Pulmonary Disease

## 2018-08-24 VITALS — BP 128/60 | HR 90 | Ht 68.0 in | Wt 247.2 lb

## 2018-08-24 DIAGNOSIS — G4733 Obstructive sleep apnea (adult) (pediatric): Secondary | ICD-10-CM

## 2018-08-24 DIAGNOSIS — Z87891 Personal history of nicotine dependence: Secondary | ICD-10-CM

## 2018-08-24 DIAGNOSIS — Z720 Tobacco use: Secondary | ICD-10-CM

## 2018-08-24 DIAGNOSIS — I5030 Unspecified diastolic (congestive) heart failure: Secondary | ICD-10-CM | POA: Diagnosis not present

## 2018-08-24 DIAGNOSIS — J439 Emphysema, unspecified: Secondary | ICD-10-CM

## 2018-08-24 DIAGNOSIS — J984 Other disorders of lung: Secondary | ICD-10-CM

## 2018-08-24 DIAGNOSIS — J9611 Chronic respiratory failure with hypoxia: Secondary | ICD-10-CM

## 2018-08-24 DIAGNOSIS — J84115 Respiratory bronchiolitis interstitial lung disease: Secondary | ICD-10-CM

## 2018-08-24 MED ORDER — BUDESONIDE-FORMOTEROL FUMARATE 80-4.5 MCG/ACT IN AERO: 2.0000 | INHALATION_SPRAY | Freq: Two times a day (BID) | RESPIRATORY_TRACT | 0 refills | Status: DC

## 2018-08-24 MED ORDER — TIOTROPIUM BROMIDE MONOHYDRATE 1.25 MCG/ACT IN AERS: 2.0000 | INHALATION_SPRAY | Freq: Every day | RESPIRATORY_TRACT | 1 refills | Status: DC

## 2018-08-24 NOTE — Progress Notes (Signed)
Synopsis: Referred in Dec 2019 for COPD exacerbation by [facility]  Subjective:   PATIENT ID: Theresa Fernandez GENDER: female DOB: 1956-05-07, MRN: [id number]  Chief Complaint  Patient presents with  . Follow-up    Pt states she believes she is retaining fluid again and states she also has problems with SOB. Pt also has had pain on right side lower back which began 2 days ago. Denies any complaints of cough or chest tightness. DME: [facility], [facility].    PMH DMII, HTN, COPD. She quit smoking on 05/31/2018. Smoked from age 6 y ears to 89, at her max was smoking 3 packs per day. She ultimately went to the ER and was admitted to the hospital on 06/06/2018 for AECOPD and decompensated heart failure. She was seen by a pulmonologist at [facility]. She was tried on several DPIs and each made her feel like she was going to vomit. She was treated with steroids and albuterol/ antibiotics while in the hospital. She was given diuretics and said she lost 20 lbs. She was given oxygen to use at home when she needed it.  She has not had any recent axial CT imaging of the chest.  She also has chronic pain due to thoracic compression fractures.  She is followed also at the [facility] for this.  She is taking chronic Percocet use.  She was diagnosed with obstructive sleep apnea greater than 10 years ago.  This was her last sleep study.  She does not remember where this was completed.  She was tried on several masks during that time.  She did not like the way that any of the masks felt.  She had very much trouble using a full facemask not having any dentures or full teeth.  She also had trouble with the nasal pillows rolling off her face.  During her hospitalization she was placed on BiPAP for some period of time.  She said she did very well while using BiPAP and would consider using it at home.  She still is exposed to significant secondhand smoke from her family members who live with her as well as  her husband.    OV 08/24/2018: Patient seen today for follow-up.  She has multiple medical problems and recently seen in the emergency room back in December for decompensated heart failure.  Former smoker quit back in November 2019.  PFTs completed in January revealed impaired spirometry with an FVC 61%, FEV1 64% ratio of 80 and moderate DLCO reduction.  Today in the office she complains of short of breath and dyspnea when she exerts herself.  She has a prior history of obstructive sleep apnea not on CPAP.  Patient denies chest pain hemoptysis.  She does not have a cardiologist.  Is interested in seeing 1.  I do think she has multiple medical risk factors for coronary disease.  And a known diagnosis of heart failure.  Last echocardiogram was in November 2019 which revealed a normal ejection fraction and grade 1 diastolic dysfunction.   Past Medical History:  Diagnosis Date  . Allergy   . Anxiety   . Arthritis   . COPD (chronic obstructive pulmonary disease) ([place])   . Depression   . Diabetes mellitus without complication ([place])   . HTN (hypertension) 01/12/2013     Family History  Problem Relation Age of Onset  . Diabetes Mother   . Alcohol abuse Father   . Diabetes Father      Past Surgical History:  Procedure Laterality Date  . TUBAL LIGATION      Social History   Socioeconomic History  . Marital status: Married    Spouse name: Not on file  . Number of children: Not on file  . Years of education: Not on file  . Highest education level: Not on file  Occupational History  . Not on file  Social Needs  . Financial resource strain: Not on file  . Food insecurity:    Worry: Not on file    Inability: Not on file  . Transportation needs:    Medical: Not on file    Non-medical: Not on file  Tobacco Use  . Smoking status: Former Smoker    Packs/day: 1.00    Years: 50.00    Pack years: 50.00    Types: Cigarettes    Last attempt to quit: 05/30/2018    Years since quitting: 0.2   . Smokeless tobacco: Never Used  Substance and Sexual Activity  . Alcohol use: No  . Drug use: No  . Sexual activity: Not on file  Lifestyle  . Physical activity:    Days per week: Not on file    Minutes per session: Not on file  . Stress: Not on file  Relationships  . Social connections:    Talks on phone: Not on file    Gets together: Not on file    Attends religious service: Not on file    Active member of club or organization: Not on file    Attends meetings of clubs or organizations: Not on file    Relationship status: Not on file  . Intimate partner violence:    Fear of current or ex partner: Not on file    Emotionally abused: Not on file    Physically abused: Not on file    Forced sexual activity: Not on file  Other Topics Concern  . Not on file  Social History Narrative  . Not on file     Allergies  Allergen Reactions  . Ceftriaxone Hives and Rash    After second dose on 06/07/2018,  . Topamax [Topiramate]   . Voltaren [Diclofenac Sodium]     migraine     Outpatient Medications Prior to Visit  Medication Sig Dispense Refill  . albuterol (PROVENTIL HFA;VENTOLIN HFA) 108 (90 Base) MCG/ACT inhaler Inhale 2 puffs into the lungs every 6 (six) hours as needed for wheezing or shortness of breath (wheezing). 1 Inhaler 5  . albuterol (PROVENTIL) (2.5 MG/3ML) 0.083% nebulizer solution Take 2.5 mg by nebulization every 6 (six) hours as needed for wheezing or shortness of breath (wheezing).     Marland Kitchen amphetamine-dextroamphetamine (ADDERALL) 20 MG tablet Take 1 tablet (20 mg total) by mouth 3 (three) times daily. 90 tablet 0  . budesonide-formoterol (SYMBICORT) 80-4.5 MCG/ACT inhaler Inhale 2 puffs into the lungs 2 (two) times daily. 1 Inhaler 2  . busPIRone (BUSPAR) 15 MG tablet Take 15 mg by mouth 2 (two) times daily.     . furosemide (LASIX) 40 MG tablet Take 1 tablet (40 mg total) by mouth daily. 30 tablet 0  . hydrOXYzine (ATARAX/VISTARIL) 25 MG tablet Take 25 mg by mouth  3 (three) times daily.    Marland Kitchen lamoTRIgine (LAMICTAL) 25 MG tablet Take 25 mg by mouth daily.    . magnesium gluconate (MAGONATE) 500 MG tablet Take 1 tablet (500 mg total) by mouth 2 (two) times daily. 60 tablet 3  . methocarbamol (ROBAXIN) 750 MG tablet Take 750 mg by  mouth daily.    Marland Kitchen omeprazole (PRILOSEC) 40 MG capsule Take 40 mg by mouth daily.    Marland Kitchen oxyCODONE-acetaminophen (PERCOCET) 10-325 MG tablet Take 1 tablet by mouth every 6 (six) hours as needed for pain.    . pioglitazone (ACTOS) 30 MG tablet Take 1 tablet (30 mg total) by mouth daily. 30 tablet 3  . potassium chloride SA (K-DUR,KLOR-CON) 20 MEQ tablet Take 1 tablet (20 mEq total) by mouth daily. 30 tablet 0  . pravastatin (PRAVACHOL) 20 MG tablet Take 1 tablet (20 mg total) by mouth daily at 6 PM. 30 tablet 0  . pregabalin (LYRICA) 150 MG capsule Take 1 capsule (150 mg total) by mouth at bedtime. (Patient taking differently: Take 100 mg by mouth 2 (two) times daily. ) 30 capsule 3  . sertraline (ZOLOFT) 100 MG tablet Take 100 mg by mouth 2 (two) times daily.     . Tiotropium Bromide Monohydrate (SPIRIVA RESPIMAT) 1.25 MCG/ACT AERS Inhale 2 puffs into the lungs daily. 1 Inhaler 0  . famotidine (PEPCID) 20 MG tablet Take 1 tablet (20 mg total) by mouth 2 (two) times daily. 30 tablet 0  . Tiotropium Bromide Monohydrate (SPIRIVA RESPIMAT) 1.25 MCG/ACT AERS Inhale 2 puffs into the lungs daily. 1 Inhaler 0  . Tiotropium Bromide Monohydrate (SPIRIVA RESPIMAT) 1.25 MCG/ACT AERS Inhale 2 puffs into the lungs daily. 4 g 2   No facility-administered medications prior to visit.     Review of Systems  Constitutional: Positive for malaise/fatigue. Negative for chills, fever and weight loss.  HENT: Negative for hearing loss, sore throat and tinnitus.   Eyes: Negative for blurred vision and double vision.  Respiratory: Positive for shortness of breath. Negative for cough, hemoptysis, sputum production, wheezing and stridor.   Cardiovascular:  Negative for chest pain, palpitations, orthopnea, leg swelling and PND.  Gastrointestinal: Negative for abdominal pain, constipation, diarrhea, heartburn, nausea and vomiting.  Genitourinary: Negative for dysuria, hematuria and urgency.  Musculoskeletal: Negative for joint pain and myalgias.  Skin: Negative for itching and rash.  Neurological: Negative for dizziness, tingling, weakness and headaches.  Endo/Heme/Allergies: Negative for environmental allergies. Does not bruise/bleed easily.  Psychiatric/Behavioral: Positive for depression. The patient is not nervous/anxious and does not have insomnia.   All other systems reviewed and are negative.    Objective:  Physical Exam Vitals signs reviewed.  Constitutional:      General: She is not in acute distress.    Appearance: She is well-developed. She is obese.     Comments: Chronically ill-appearing  HENT:     Head: Normocephalic and atraumatic.  Eyes:     General: No scleral icterus.    Conjunctiva/sclera: Conjunctivae normal.     Pupils: Pupils are equal, round, and reactive to light.  Neck:     Musculoskeletal: Neck supple.     Vascular: No JVD.     Trachea: No tracheal deviation.  Cardiovascular:     Rate and Rhythm: Normal rate and regular rhythm.     Heart sounds: Normal heart sounds. No murmur.  Pulmonary:     Effort: Pulmonary effort is normal. No tachypnea, accessory muscle usage or respiratory distress.     Breath sounds: Normal breath sounds. No stridor. No wheezing, rhonchi or rales.  Abdominal:     General: Bowel sounds are normal. There is no distension.     Palpations: Abdomen is soft.     Tenderness: There is no abdominal tenderness.     Comments: Morbidly obese pannus  Musculoskeletal:  General: No tenderness.  Lymphadenopathy:     Cervical: No cervical adenopathy.  Skin:    General: Skin is warm and dry.     Capillary Refill: Capillary refill takes less than 2 seconds.     Findings: No rash.   Neurological:     Mental Status: She is alert and oriented to person, place, and time.  Psychiatric:        Behavior: Behavior normal.      Vitals:   08/24/18 1457  BP: 128/60  Pulse: 90  SpO2: 95%  Weight: 247 lb 3.2 oz (112.1 kg)  Height: 5\' 8"  (1.727 m)   95% on RA BMI Readings from Last 3 Encounters:  07/20/18 39.08 kg/m  06/25/18 39.08 kg/m  06/22/18 38.62 kg/m   Wt Readings from Last 3 Encounters:  07/20/18 257 lb (116.6 kg)  06/25/18 257 lb (116.6 kg)  06/22/18 254 lb (115.2 kg)     CBC    Component Value Date/Time   WBC 10.0 06/25/2018 1227   RBC 3.95 06/25/2018 1227   HGB 11.6 (L) 06/25/2018 1227   HCT 39.4 06/25/2018 1227   PLT 247 06/25/2018 1227   MCV 99.7 06/25/2018 1227   MCH 29.4 06/25/2018 1227   MCHC 29.4 (L) 06/25/2018 1227   RDW 13.5 06/25/2018 1227   LYMPHSABS 1.8 06/06/2018 1458   MONOABS 0.6 06/06/2018 1458   EOSABS 0.3 06/06/2018 1458   BASOSABS 0.1 06/06/2018 1458    Chest Imaging: 06/06/2018 chest x-ray: Bilateral vascular congestion 06/12/2018 chest x-ray: Bilateral interstitial prominence somewhat improved when compared to previous x-ray  08/04/2018 HRCT: Patchy centrilobular groundglass nodules and longstanding history of smoking likely imaging consistent with respiratory bronchiolitis with ILD.  Not consistent with UIP. The patient's images have been independently reviewed by me.    Pulmonary Functions Testing Results: PFT Results Latest Ref Rng & Units 07/20/2018  FVC-Pre L 1.78  FVC-Predicted Pre % 58  FVC-Post L 1.87  FVC-Predicted Post % 61  Pre FEV1/FVC % % 77  Post FEV1/FCV % % 80  FEV1-Pre L 1.38  FEV1-Predicted Pre % 58  FEV1-Post L 1.50  DLCO UNC% % 55  DLCO COR %Predicted % 92  TLC L 3.85  TLC % Predicted % 79  RV % Predicted % 96    FeNO: None   Pathology: None   Echocardiogram: 06/08/2018 - Preserved EF, Grade 1 DD.   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  She is morbidly obese and has a chronic diastolic heart failure and a recent HRCT with evidence of RB ILD.  Her PFTs would be consistent with this as well showing a mixed restrictive and obstructive lung disease with impaired spirometry.  I suspect that her obstructive lung disease is related to moderate COPD along with restriction from her morbid obesity and smoking-related and her stational changes. She is continued to having significant dyspnea on exertion.  I do think she would benefit from evaluation and at least to establish care with a primary cardiologist. She has yet to have follow-up with sleep clinic  for evaluation of her OSA and possibly OHS however she has tried CPAP in the past and did not like it but I think that she needs to reevaluate this. She can continue on her Spiriva Respimat we will add Symbicort inhaler to see if this makes any of your symptoms better. Albuterol as needed for shortness of breath and wheezing. Patient can return to see [place] in 6 months.  She will need a 1 year enrollment in the lung cancer screening program as she has had CT imaging now in January 2020.  Greater than 50% of this patient's 40-minute office visit was been face-to-face discussing the above recommendations treatment plan as well as reviewing imaging and recent emergency room evaluation.  We also discussed pulmonary function tests.    Current Outpatient Medications:  .  albuterol (PROVENTIL HFA;VENTOLIN HFA) 108 (90 Base) MCG/ACT inhaler, Inhale  2 puffs into the lungs every 6 (six) hours as needed for wheezing or shortness of breath (wheezing)., Disp: 1 Inhaler, Rfl: 5 .  albuterol (PROVENTIL) (2.5 MG/3ML) 0.083% nebulizer solution, Take 2.5 mg by nebulization every 6 (six) hours as needed for wheezing or shortness of breath (wheezing). , Disp: , Rfl:  .  amphetamine-dextroamphetamine (ADDERALL) 20 MG tablet, Take 1 tablet (20 mg total) by mouth 3 (three) times daily., Disp: 90 tablet, Rfl: 0 .  budesonide-formoterol (SYMBICORT) 80-4.5 MCG/ACT inhaler, Inhale 2 puffs into the lungs 2 (two) times daily., Disp: 1 Inhaler, Rfl: 2 .  busPIRone (BUSPAR) 15 MG tablet, Take 15 mg by mouth 2 (two) times daily. , Disp: , Rfl:  .  furosemide (LASIX) 40 MG tablet, Take 1 tablet (40 mg total) by mouth daily., Disp: 30 tablet, Rfl: 0 .  hydrOXYzine (ATARAX/VISTARIL) 25 MG tablet, Take 25 mg by mouth 3 (three) times daily., Disp: , Rfl:  .  lamoTRIgine (LAMICTAL) 25 MG tablet, Take 25 mg by mouth daily., Disp: , Rfl:  .  magnesium gluconate (MAGONATE) 500 MG tablet, Take 1 tablet (500 mg total) by mouth 2 (two) times daily., Disp: 60 tablet, Rfl: 3 .  methocarbamol (ROBAXIN) 750 MG tablet, Take 750 mg by mouth daily., Disp: , Rfl:  .  omeprazole (PRILOSEC) 40 MG capsule, Take 40 mg by mouth daily., Disp: , Rfl:  .  oxyCODONE-acetaminophen (PERCOCET) 10-325 MG tablet, Take 1 tablet by mouth every 6 (six) hours as needed for pain., Disp: , Rfl:  .  pioglitazone (ACTOS) 30 MG tablet, Take 1 tablet (30 mg total) by mouth daily., Disp: 30 tablet, Rfl: 3 .  potassium chloride SA (K-DUR,KLOR-CON) 20 MEQ tablet, Take 1 tablet (20 mEq total) by mouth daily., Disp: 30 tablet, Rfl: 0 .  pravastatin (PRAVACHOL) 20 MG tablet, Take 1 tablet (20 mg total) by mouth daily at 6 PM., Disp: 30 tablet, Rfl: 0 .  pregabalin (LYRICA) 150 MG capsule, Take 1 capsule (150 mg total) by mouth at bedtime. (Patient taking differently: Take 100 mg by mouth 2 (two) times daily. ), Disp:  30 capsule, Rfl: 3 .  sertraline (ZOLOFT) 100 MG tablet, Take 100 mg by mouth 2 (two) times daily. , Disp: , Rfl:  .  Tiotropium Bromide Monohydrate (SPIRIVA RESPIMAT) 1.25 MCG/ACT AERS, Inhale 2 puffs into the lungs daily., Disp: 1 Inhaler, Rfl: 0   Garner Nash, DO  Pulmonary Critical Care 08/24/2018 2:55 PM

## 2018-08-24 NOTE — Patient Instructions (Addendum)
Thank you for visiting Dr. Valeta Harms at [facility]. Today we recommend the following: Orders Placed This Encounter  Procedures  . Ambulatory referral to Cardiology   Meds ordered this encounter  Medications  . Tiotropium Bromide Monohydrate (SPIRIVA RESPIMAT) 1.25 MCG/ACT AERS    Sig: Inhale 2 puffs into the lungs daily.    Dispense:  3 Inhaler    Refill:  1    Order Specific Question:   Lot Number?    Answer:   [id number]    Order Specific Question:   Expiration Date?    Answer:   09/21/2019    Order Specific Question:   Quantity    Answer:   1  . budesonide-formoterol (SYMBICORT) 80-4.5 MCG/ACT inhaler    Sig: Inhale 2 puffs into the lungs every 12 (twelve) hours.    Dispense:  1 Inhaler    Refill:  0    Order Specific Question:   Lot Number?    Answer:   [id number]    Order Specific Question:   Expiration Date?    Answer:   07/25/2019    Order Specific Question:   Manufacturer?    Answer:   [employer] [[id number]]    Order Specific Question:   Quantity    Answer:   1   Return in about 6 months (around 02/22/2019).

## 2018-08-24 NOTE — Progress Notes (Signed)
Patient seen in the office today and instructed on use of Symbicort.  Patient expressed understanding and demonstrated technique. ° °

## 2018-08-25 ENCOUNTER — Encounter: Payer: Self-pay | Admitting: Pulmonary Disease

## 2018-08-25 DIAGNOSIS — J984 Other disorders of lung: Secondary | ICD-10-CM | POA: Insufficient documentation

## 2018-08-25 DIAGNOSIS — I5030 Unspecified diastolic (congestive) heart failure: Secondary | ICD-10-CM | POA: Insufficient documentation

## 2018-08-25 DIAGNOSIS — J439 Emphysema, unspecified: Principal | ICD-10-CM

## 2018-08-26 NOTE — Addendum Note (Signed)
Addended by: Vivia Ewing on: 08/26/2018 02:15 PM   Modules accepted: Orders

## 2018-09-02 ENCOUNTER — Ambulatory Visit (INDEPENDENT_AMBULATORY_CARE_PROVIDER_SITE_OTHER): Admitting: Pulmonary Disease

## 2018-09-02 ENCOUNTER — Encounter: Payer: Self-pay | Admitting: Pulmonary Disease

## 2018-09-02 VITALS — BP 116/82 | HR 96 | Ht 65.5 in | Wt 249.0 lb

## 2018-09-02 DIAGNOSIS — G4733 Obstructive sleep apnea (adult) (pediatric): Secondary | ICD-10-CM

## 2018-09-02 NOTE — Progress Notes (Signed)
Theresa Fernandez    [id number]    [birth year]  Primary Care Physician:[facility]  Referring Physician: [facility] [address] [place], [place] [address]  Chief complaint:   Patient with a history of obstructive sleep apnea intolerant of CPAP in the past  HPI:  He was recently hospitalized in November 2019-she tried BiPAP in the hospital and was able to sleep with BiPAP on Multiple comorbidities  follows up with Dr. Valeta Harms for chronic hypoxemic respiratory failure, RB ILD  Multiple comorbidities including diabetes, hypertension, chronic obstructive pulmonary disease  She quit smoking in November 2019  She has been on oxygen supplementation since the recent hospitalization  She was diagnosed with obstructive sleep apnea about 10 years ago She was not able to tolerate CPAP-she felt she gave it a good effort  Usually goes to bed about 11 PM, wakes up finally about 7 AM  She does wake up about 2-3 times during the night-to let her dog out  She feels she is functioning relatively well and better than prior to recent hospitalization  Outpatient Encounter Medications as of 09/02/2018  Medication Sig  . albuterol (PROVENTIL HFA;VENTOLIN HFA) 108 (90 Base) MCG/ACT inhaler Inhale 2 puffs into the lungs every 6 (six) hours as needed for wheezing or shortness of breath (wheezing).  Marland Kitchen albuterol (PROVENTIL) (2.5 MG/3ML) 0.083% nebulizer solution Take 2.5 mg by nebulization every 6 (six) hours as needed for wheezing or shortness of breath (wheezing).   Marland Kitchen amphetamine-dextroamphetamine (ADDERALL) 20 MG tablet Take 1 tablet (20 mg total) by mouth 3 (three) times daily.  . budesonide-formoterol (SYMBICORT) 80-4.5 MCG/ACT inhaler Inhale 2 puffs into the lungs 2 (two) times daily.  . budesonide-formoterol (SYMBICORT) 80-4.5 MCG/ACT inhaler Inhale 2 puffs into the lungs every 12 (twelve) hours.  . busPIRone (BUSPAR) 15 MG tablet Take 15 mg by mouth 2 (two) times  daily.   . furosemide (LASIX) 40 MG tablet Take 1 tablet (40 mg total) by mouth daily. (Patient taking differently: Take 20 mg by mouth daily. Take 3 tabs daily)  . hydrOXYzine (ATARAX/VISTARIL) 25 MG tablet Take 25 mg by mouth 3 (three) times daily.  Marland Kitchen lamoTRIgine (LAMICTAL) 25 MG tablet Take 25 mg by mouth daily.  . magnesium gluconate (MAGONATE) 500 MG tablet Take 1 tablet (500 mg total) by mouth 2 (two) times daily.  . methocarbamol (ROBAXIN) 750 MG tablet Take 750 mg by mouth daily.  Marland Kitchen omeprazole (PRILOSEC) 40 MG capsule Take 40 mg by mouth daily.  Marland Kitchen oxyCODONE-acetaminophen (PERCOCET) 10-325 MG tablet Take 1 tablet by mouth every 6 (six) hours as needed for pain.  . pioglitazone (ACTOS) 30 MG tablet Take 1 tablet (30 mg total) by mouth daily.  . potassium chloride SA (K-DUR,KLOR-CON) 20 MEQ tablet Take 1 tablet (20 mEq total) by mouth daily.  . pravastatin (PRAVACHOL) 20 MG tablet Take 1 tablet (20 mg total) by mouth daily at 6 PM.  . pregabalin (LYRICA) 150 MG capsule Take 1 capsule (150 mg total) by mouth at bedtime. (Patient taking differently: Take 100 mg by mouth 2 (two) times daily. )  . sertraline (ZOLOFT) 100 MG tablet Take 100 mg by mouth 2 (two) times daily.   . Tiotropium Bromide Monohydrate (SPIRIVA RESPIMAT) 1.25 MCG/ACT AERS Inhale 2 puffs into the lungs daily.   No facility-administered encounter medications on file as of 09/02/2018.     Allergies as of 09/02/2018 - Review Complete 09/02/2018  Allergen Reaction Noted  .  Ceftriaxone Hives and Rash 06/07/2018  . Topamax [topiramate]  01/12/2013  . Voltaren [diclofenac sodium]  06/06/2018    Past Medical History:  Diagnosis Date  . Allergy   . Anxiety   . Arthritis   . COPD (chronic obstructive pulmonary disease) ([place])   . Depression   . Diabetes mellitus without complication ([place])   . HTN (hypertension) 01/12/2013    Past Surgical History:  Procedure Laterality Date  . TUBAL LIGATION      Family History    Problem Relation Age of Onset  . Diabetes Mother   . Alcohol abuse Father   . Diabetes Father     Social History   Socioeconomic History  . Marital status: Married    Spouse name: Not on file  . Number of children: Not on file  . Years of education: Not on file  . Highest education level: Not on file  Occupational History  . Not on file  Social Needs  . Financial resource strain: Not on file  . Food insecurity:    Worry: Not on file    Inability: Not on file  . Transportation needs:    Medical: Not on file    Non-medical: Not on file  Tobacco Use  . Smoking status: Former Smoker    Packs/day: 3.00    Years: 50.00    Pack years: 150.00    Types: Cigarettes    Last attempt to quit: 05/30/2018    Years since quitting: 0.2  . Smokeless tobacco: Never Used  Substance and Sexual Activity  . Alcohol use: No  . Drug use: No  . Sexual activity: Not on file  Lifestyle  . Physical activity:    Days per week: Not on file    Minutes per session: Not on file  . Stress: Not on file  Relationships  . Social connections:    Talks on phone: Not on file    Gets together: Not on file    Attends religious service: Not on file    Active member of club or organization: Not on file    Attends meetings of clubs or organizations: Not on file    Relationship status: Not on file  . Intimate partner violence:    Fear of current or ex partner: Not on file    Emotionally abused: Not on file    Physically abused: Not on file    Forced sexual activity: Not on file  Other Topics Concern  . Not on file  Social History Narrative  . Not on file    Review of Systems  Constitutional: Positive for fatigue.  HENT: Negative.   Eyes: Negative.   Respiratory: Positive for shortness of breath. Negative for chest tightness.   Cardiovascular: Positive for leg swelling.  Gastrointestinal: Negative.   Endocrine: Negative.     Vitals:   09/02/18 1446  BP: 116/82  Pulse: 96  SpO2: 100%      Physical Exam  Constitutional: She appears well-developed and well-nourished.  HENT:  Head: Normocephalic and atraumatic.  Right Ear: External ear normal.  Eyes: Pupils are equal, round, and reactive to light. Conjunctivae and EOM are normal. Right eye exhibits no discharge. Left eye exhibits no discharge.  Neck: Normal range of motion. Neck supple. No tracheal deviation present. No thyromegaly present.  Cardiovascular: Normal rate and regular rhythm.  Pulmonary/Chest: Effort normal and breath sounds normal. No respiratory distress. She has no wheezes. She has no rales. She exhibits no tenderness.  Abdominal: Soft.  Bowel sounds are normal. She exhibits no distension. There is no abdominal tenderness.   Data Reviewed: Office records reviewed  Recent hospital records reviewed  Previous sleep study could not be accessed as it was over 10 years ago  Recent CT scan reviewed 08/04/2018  Assessment:  Obstructive sleep apnea -Intolerance to CPAP in the past -Was able to tolerate BiPAP recently in the hospital  Morbid obesity -Weight loss efforts at table -She does have multiple spinal fractures that limits her ability to get around  Hypoxemic respiratory failure -Will continue oxygen supplementation   Plan/Recommendations:  We will schedule patient for BiPAP titration study  Encouraged to continue oxygen supplementation  We will continue following up with Dr. Valeta Harms for hypoxemic respiratory failure  I will see her back in the office in about 3 months   Sherrilyn Rist MD [facility] 09/02/2018, 3:03 PM  CC: [facility]

## 2018-09-02 NOTE — Patient Instructions (Signed)
History of obstructive sleep apnea, intolerant of CPAP in the past You were able to tolerate BiPAP recently in the hospital  We will schedule you for split-night study we will try to diagnose the problem and apply BiPAP to treated  I will see you back in the office in about 3 months Call with significant concerns Sleep Apnea Sleep apnea is a condition in which breathing pauses or becomes shallow during sleep. Episodes of sleep apnea usually last 10 seconds or longer, and they may occur as many as 20 times an hour. Sleep apnea disrupts your sleep and keeps your body from getting the rest that it needs. This condition can increase your risk of certain health problems, including:  Heart attack.  Stroke.  Obesity.  Diabetes.  Heart failure.  Irregular heartbeat. There are three kinds of sleep apnea:  Obstructive sleep apnea. This kind is caused by a blocked or collapsed airway.  Central sleep apnea. This kind happens when the part of the brain that controls breathing does not send the correct signals to the muscles that control breathing.  Mixed sleep apnea. This is a combination of obstructive and central sleep apnea. What are the causes? The most common cause of this condition is a collapsed or blocked airway. An airway can collapse or become blocked if:  Your throat muscles are abnormally relaxed.  Your tongue and tonsils are larger than normal.  You are overweight.  Your airway is smaller than normal. What increases the risk? This condition is more likely to develop in people who:  Are overweight.  Smoke.  Have a smaller than normal airway.  Are elderly.  Are female.  Drink alcohol.  Take sedatives or tranquilizers.  Have a family history of sleep apnea. What are the signs or symptoms? Symptoms of this condition include:  Trouble staying asleep.  Daytime sleepiness and tiredness.  Irritability.  Loud snoring.  Morning headaches.  Trouble  concentrating.  Forgetfulness.  Decreased interest in sex.  Unexplained sleepiness.  Mood swings.  Personality changes.  Feelings of depression.  Waking up often during the night to urinate.  Dry mouth.  Sore throat. How is this diagnosed? This condition may be diagnosed with:  A medical history.  A physical exam.  A series of tests that are done while you are sleeping (sleep study). These tests are usually done in a sleep lab, but they may also be done at home. How is this treated? Treatment for this condition aims to restore normal breathing and to ease symptoms during sleep. It may involve managing health issues that can affect breathing, such as high blood pressure or obesity. Treatment may include:  Sleeping on your side.  Using a decongestant if you have nasal congestion.  Avoiding the use of depressants, including alcohol, sedatives, and narcotics.  Losing weight if you are overweight.  Making changes to your diet.  Quitting smoking.  Using a device to open your airway while you sleep, such as: ? An oral appliance. This is a custom-made mouthpiece that shifts your lower jaw forward. ? A continuous positive airway pressure (CPAP) device. This device delivers oxygen to your airway through a mask. ? A nasal expiratory positive airway pressure (EPAP) device. This device has valves that you put into each nostril. ? A bi-level positive airway pressure (BPAP) device. This device delivers oxygen to your airway through a mask.  Surgery if other treatments do not work. During surgery, excess tissue is removed to create a wider airway. It is  important to get treatment for sleep apnea. Without treatment, this condition can lead to:  High blood pressure.  Coronary artery disease.  (Men) An inability to achieve or maintain an erection (impotence).  Reduced thinking abilities. Follow these instructions at home:  Make any lifestyle changes that your health care  provider recommends.  Eat a healthy, well-balanced diet.  Take over-the-counter and prescription medicines only as told by your health care provider.  Avoid using depressants, including alcohol, sedatives, and narcotics.  Take steps to lose weight if you are overweight.  If you were given a device to open your airway while you sleep, use it only as told by your health care provider.  Do not use any tobacco products, such as cigarettes, chewing tobacco, and e-cigarettes. If you need help quitting, ask your health care provider.  Keep all follow-up visits as told by your health care provider. This is important. Contact a health care provider if:  The device that you received to open your airway during sleep is uncomfortable or does not seem to be working.  Your symptoms do not improve.  Your symptoms get worse. Get help right away if:  You develop chest pain.  You develop shortness of breath.  You develop discomfort in your back, arms, or stomach.  You have trouble speaking.  You have weakness on one side of your body.  You have drooping in your face. These symptoms may represent a serious problem that is an emergency. Do not wait to see if the symptoms will go away. Get medical help right away. Call your local emergency services (911 in the [place] Do not drive yourself to the hospital. This information is not intended to replace advice given to you by your health care provider. Make sure you discuss any questions you have with your health care provider. Document Released: 06/28/2002 Document Revised: 02/03/2017 Document Reviewed: 04/17/2015 Elsevier Interactive Patient Education  2019 [employer].

## 2018-10-03 ENCOUNTER — Ambulatory Visit (HOSPITAL_BASED_OUTPATIENT_CLINIC_OR_DEPARTMENT_OTHER)

## 2018-10-14 ENCOUNTER — Telehealth: Payer: Self-pay | Admitting: Internal Medicine

## 2018-10-14 NOTE — Telephone Encounter (Signed)
Pt states she would like to keep her appointment but don't have access to a computer. Pt agreeable to phone visit.

## 2018-10-14 NOTE — Telephone Encounter (Signed)
Thanks .. please arrange.  Dr Lemmie Evens

## 2018-10-14 NOTE — Telephone Encounter (Signed)
New Message         Patient is calling in today to let us know that she is keeping her appointment on Monday 03/30 and she can do a telephone visit. Pls call to advise.

## 2018-10-16 ENCOUNTER — Other Ambulatory Visit: Payer: Self-pay

## 2018-10-19 ENCOUNTER — Encounter: Payer: Self-pay | Admitting: Internal Medicine

## 2018-10-19 ENCOUNTER — Telehealth (INDEPENDENT_AMBULATORY_CARE_PROVIDER_SITE_OTHER): Admitting: Internal Medicine

## 2018-10-19 ENCOUNTER — Telehealth: Payer: Self-pay

## 2018-10-19 ENCOUNTER — Other Ambulatory Visit: Payer: Self-pay

## 2018-10-19 VITALS — BP 137/76 | HR 91 | Wt 258.0 lb

## 2018-10-19 DIAGNOSIS — E876 Hypokalemia: Secondary | ICD-10-CM | POA: Diagnosis not present

## 2018-10-19 DIAGNOSIS — N183 Chronic kidney disease, stage 3 unspecified: Secondary | ICD-10-CM

## 2018-10-19 DIAGNOSIS — J438 Other emphysema: Secondary | ICD-10-CM

## 2018-10-19 DIAGNOSIS — I5032 Chronic diastolic (congestive) heart failure: Secondary | ICD-10-CM | POA: Insufficient documentation

## 2018-10-19 DIAGNOSIS — J439 Emphysema, unspecified: Secondary | ICD-10-CM | POA: Diagnosis not present

## 2018-10-19 DIAGNOSIS — I5033 Acute on chronic diastolic (congestive) heart failure: Secondary | ICD-10-CM

## 2018-10-19 DIAGNOSIS — E1165 Type 2 diabetes mellitus with hyperglycemia: Secondary | ICD-10-CM

## 2018-10-19 DIAGNOSIS — J984 Other disorders of lung: Secondary | ICD-10-CM

## 2018-10-19 MED ORDER — POTASSIUM CHLORIDE CRYS ER 20 MEQ PO TBCR: 40.0000 meq | EXTENDED_RELEASE_TABLET | Freq: Every day | ORAL | 0 refills | Status: DC

## 2018-10-19 MED ORDER — FUROSEMIDE 40 MG PO TABS: 40.0000 mg | ORAL_TABLET | Freq: Two times a day (BID) | ORAL | 3 refills | Status: AC

## 2018-10-19 MED ORDER — METOLAZONE 2.5 MG PO TABS: 2.5000 mg | ORAL_TABLET | ORAL | 3 refills | Status: DC

## 2018-10-19 NOTE — Patient Instructions (Addendum)
Medication Instructions:  Your physician has recommended you make the following change in your medication:   INCREASE YOUR FUROSEMIDE (LASIX) TO 40 MG BY MOUTH TWICE A DAY  INCREASE YOUR POTASSIUM CHLORIDE TO 40 MEQ BY MOUTH DAILY  START METOLAZONE (ZAROXOLYN) 2.5 MG BY MOUTH EVERY OTHER DAY (start taking this medication on Tuesday, 10/20/2018 morning)  If you need a refill on your cardiac medications before your next appointment, please call your pharmacy.   Lab work: Your physician recommends that you return for lab work ON Friday October 23, 2018. (BMET AND BNP)  If you have labs (blood work) drawn today and your tests are completely normal, you will receive your results only by: Marland Kitchen MyChart Message (if you have MyChart) OR . A paper copy in the mail If you have any lab test that is abnormal or we need to change your treatment, we will call you to review the results.  Testing/Procedures: NONE  Follow-Up: At [facility], you and your health needs are our priority.  As part of our continuing mission to provide you with exceptional heart care, we have created designated Provider Care Teams.  These Care Teams include your primary Cardiologist (physician) and Advanced Practice Providers (APPs -  Physician Assistants and Nurse Practitioners) who all work together to provide you with the care you need, when you need it. Dr. Debara Pickett will follow up with you on Friday, October 23, 2018 for updates on how you are doing.  Any Other Special Instructions Will Be Listed Below (If Applicable). PLEASE CONTINUE TO WEIGH YOURSELF ON A DAILY BASIS. TRY TO CHECK YOUR WEIGHT AROUND THE SAME TIME EACH DAY.

## 2018-10-19 NOTE — Progress Notes (Signed)
Virtual Visit via Telephone Note    Evaluation Performed:  New patient visit for diastolic CHF  This visit type was conducted due to national recommendations for restrictions regarding the COVID-19 Pandemic (e.g. social distancing).  This format is felt to be most appropriate for this patient at this time.  All issues noted in this document were discussed and addressed.  No physical exam was performed (except for noted visual exam findings with Video Visits).  Please refer to the patient's chart (MyChart message for video visits and phone note for telephone visits) for the patient's consent to telehealth for [facility].  Date:  10/19/2018   ID:  Theresa Fernandez, DOB [birth year], MRN [id number]  Patient Location:  [place] [id number]  Provider location:   [address] [place], [place] [address]  PCP:  [facility]  Cardiologist:  No primary care provider on file. Electrophysiologist:  None   Chief Complaint:  Shortness of breath, weight gain and leg swelling  History of Present Illness:    Theresa Fernandez is a 63 y.o. female who presents via audio/video conferencing for a telehealth visit today.  This is a pleasant 63 year old female (whose mother is a patient of mine), kindly referred by Dr. Valeta Harms with a history of tobacco abuse, COPD, OSA (not on CPAP), type 2 diabetes on lisinopril for renal protection, questionable history of hypertension, diastolic heart failure with recent exacerbation in November 2019.  She had an echocardiogram at that time which I personally reviewed that showed an LVEF of 60 to 82%, grade 1 diastolic dysfunction and indeterminate LV filling pressure, mild aortic sclerosis with trivial aortic insufficiency.  The left atrium was normal size.  RV was normal in size and function.  During that hospitalization she was diuresed with IV Lasix and lost 20 pounds.  After discharge she felt well and was able to lay flat.   She had no significant edema.  She was seen by Dr. Valeta Harms on 08/24/2018, and her weight at the time was 247 pounds.  It is now up to 258 pounds.  She says she is actually gained 8 pounds in the last week.  Despite this, her diet has been stable if not demonstrated a reduction in calories to try to intentionally lose weight.  She reports increasing swelling in her legs, in fact she has pitting edema to below the knee.  She has been short of breath, particularly orthopneic, having to sleep upright in a recliner.  She says at times she feels like she may stop breathing.  She was scheduled for a sleep study however she had a URI and the sleep study was postponed.  She denies any chest pain.  She also has stage III chronic kidney disease.  In December, her creatinine had increased to 1.71 up from 1.25-1.35, 4 months prior.  She did say she had lab work at [facility] a few weeks ago however we were not able to bring that up in the [place] and the labs are not currently available.  The patient does not have symptoms concerning for COVID-19 infection (fever, chills, cough, or new SHORTNESS OF BREATH).    Prior CV studies:   The following studies were reviewed today:  Echo 06/08/2018  Hi-Res chest CT - 08/04/2018: 2 vessel coronary calcification of the LAD and RCA with aortic atherosclerosis, mild emphysema and possible smoking-related interstitial lung disease  PMHx:  Past Medical History:  Diagnosis Date  . Allergy   .  Anxiety   . Arthritis   . COPD (chronic obstructive pulmonary disease) ([place])   . Depression   . Diabetes mellitus without complication ([place])   . HTN (hypertension) 01/12/2013    Past Surgical History:  Procedure Laterality Date  . TUBAL LIGATION      FAMHx:  Family History  Problem Relation Age of Onset  . Diabetes Mother   . Alcohol abuse Father   . Diabetes Father     SOCHx:   reports that she quit smoking about 4 months ago. Her smoking use included  cigarettes. She has a 150.00 pack-year smoking history. She has never used smokeless tobacco. She reports that she does not drink alcohol or use drugs.  ALLERGIES:  Allergies  Allergen Reactions  . Ceftriaxone Hives and Rash    After second dose on 06/07/2018,  . Topamax [Topiramate]   . Voltaren [Diclofenac Sodium]     migraine    MEDS:  Current Meds  Medication Sig  . albuterol (PROVENTIL HFA;VENTOLIN HFA) 108 (90 Base) MCG/ACT inhaler Inhale 2 puffs into the lungs every 6 (six) hours as needed for wheezing or shortness of breath (wheezing).  Marland Kitchen albuterol (PROVENTIL) (2.5 MG/3ML) 0.083% nebulizer solution Take 2.5 mg by nebulization every 6 (six) hours as needed for wheezing or shortness of breath (wheezing).   Marland Kitchen amphetamine-dextroamphetamine (ADDERALL) 20 MG tablet Take 1 tablet (20 mg total) by mouth 3 (three) times daily.  . budesonide-formoterol (SYMBICORT) 80-4.5 MCG/ACT inhaler Inhale 2 puffs into the lungs 2 (two) times daily.  . busPIRone (BUSPAR) 15 MG tablet Take 15 mg by mouth 2 (two) times daily.   . furosemide (LASIX) 40 MG tablet Take 1 tablet (40 mg total) by mouth 2 (two) times daily.  Marland Kitchen HYDROcodone-acetaminophen (NORCO) 10-325 MG tablet Take 1 tablet by mouth every 5 (five) hours as needed.  . hydrOXYzine (ATARAX/VISTARIL) 25 MG tablet Take 25 mg by mouth 3 (three) times daily.  Marland Kitchen lisinopril (PRINIVIL,ZESTRIL) 2.5 MG tablet Take 2.5 mg by mouth daily.  . magnesium gluconate (MAGONATE) 500 MG tablet Take 1 tablet (500 mg total) by mouth 2 (two) times daily.  Marland Kitchen omeprazole (PRILOSEC) 40 MG capsule Take 40 mg by mouth daily.  . pioglitazone (ACTOS) 30 MG tablet Take 1 tablet (30 mg total) by mouth daily.  . potassium chloride SA (K-DUR,KLOR-CON) 20 MEQ tablet Take 2 tablets (40 mEq total) by mouth daily.  . pregabalin (LYRICA) 150 MG capsule Take 1 capsule (150 mg total) by mouth at bedtime. (Patient taking differently: Take 100 mg by mouth 2 (two) times daily. )  .  sertraline (ZOLOFT) 100 MG tablet Take 100 mg by mouth 2 (two) times daily.   . [DISCONTINUED] furosemide (LASIX) 40 MG tablet Take 1 tablet (40 mg total) by mouth daily. (Patient taking differently: Take 20 mg by mouth daily. Take 3 tabs daily)  . [DISCONTINUED] potassium chloride SA (K-DUR,KLOR-CON) 20 MEQ tablet Take 1 tablet (20 mEq total) by mouth daily.     ROS: Pertinent items noted in HPI and remainder of comprehensive ROS otherwise negative.  Labs/Other Tests and Data Reviewed:    Recent Labs: 06/08/2018: ALT 15 06/09/2018: Magnesium 2.2 06/25/2018: B Natriuretic Peptide 63.8; Hemoglobin 11.6; Platelets 247 07/20/2018: BUN 35; Creatinine, Ser 1.71; Potassium 4.3; Pro B Natriuretic peptide (BNP) 38.0; Sodium 135   Recent Lipid Panel Lab Results  Component Value Date/Time   CHOL 208 (H) 02/11/2013 09:22 AM   TRIG 132 02/11/2013 09:22 AM   HDL 47 02/11/2013 09:22  AM   CHOLHDL 4.4 02/11/2013 09:22 AM   LDLCALC 135 (H) 02/11/2013 09:22 AM    Wt Readings from Last 3 Encounters:  10/19/18 258 lb (117 kg)  09/02/18 249 lb (112.9 kg)  08/24/18 247 lb 3.2 oz (112.1 kg)     Exam:    Vital Signs:  BP 137/76   Pulse 91   Wt 258 lb (117 kg) Comment: pt states weight 3 days ago was 251lbs  BMI 42.28 kg/m    Telephone visit, no exam  ASSESSMENT & PLAN:    1.  Acute on chronic diastolic CHF, LVEF 52-84% 2.  CKD 3 3.  DM2, uncontrolled 4.  COPD w/interstitial disease 5.  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  Hypokalemia  Theresa Fernandez is experiencing acute on chronic diastolic congestive heart failure, with what is described as 2+ bilateral lower extremity pitting edema, orthopnea, dyspnea on exertion and weight gain.  She is up 8 pounds since early last week.  She had increased her diuretic slightly now taking 40 mg of Lasix in the morning and 20 mg in the afternoon.  I have advised her to increase her Lasix to 40 mg twice daily and will add metolazone 2.5 mg every other day.   We will plan to repeat a metabolic profile and BNP on Friday and I will touch base with her again at that time to see if we are making any progress with diuresis.  If she does not respond favorably to the Lasix metolazone combination or has significant worsening renal function, despite diuresis, then she may need admission for IV diuretics.  Additionally, we will need to further investigate her two-vessel coronary disease seen on high-resolution chest CT in January.  She is not describing ischemic symptoms at this time however ischemia could contribute to her diastolic heart failure exacerbations.  We will also plan empirically to increase her potassium to 40 mEq daily, and again reassess that on Friday.  Thanks again for the kind referral.  We will touch base with the patient by phone on Friday and plan a future follow-up visit.   COVID-19 Education: The signs and symptoms of COVID-19 were discussed with the patient and how to seek care for testing (follow up with PCP or arrange E-visit).  The importance of social distancing was discussed today.  Patient Risk:   After full review of this patients clinical status, I feel that they are at least moderate risk at this time.  Time:   Today, I have spent 25 minutes with the patient with telehealth technology discussing diastolic heart failure, signs and symptoms of worsening heart failure, what to monitor and the treatment recommendations..     Medication Adjustments/Labs and Tests Ordered: Current medicines are reviewed at length with the patient today.  Concerns regarding medicines are outlined above.   Tests Ordered: Orders Placed This Encounter  Procedures  . Basic metabolic panel  . Brain natriuretic peptide    Medication Changes: Meds ordered this encounter  Medications  . metolazone (ZAROXOLYN) 2.5 MG tablet    Sig: Take 1 tablet (2.5 mg total) by mouth every other day. Start this medication on 10/20/2018 in the morning    Dispense:   45 tablet    Refill:  3  . furosemide (LASIX) 40 MG tablet    Sig: Take 1 tablet (40 mg total) by mouth 2 (two) times daily.    Dispense:  90 tablet    Refill:  3  . potassium  chloride SA (K-DUR,KLOR-CON) 20 MEQ tablet    Sig: Take 2 tablets (40 mEq total) by mouth daily.    Dispense:  30 tablet    Refill:  0    Disposition:  in 1 month(s)  [name], MD, [facility], [place] Director of the Advanced Lipid Disorders &  Cardiovascular Risk Reduction Clinic Diplomate of the American Board of Clinical Lipidology Attending Cardiologist  Direct Dial: [phone]  Fax: [phone]  Website:  [email or web address]  [name], MD  10/19/2018 4:37 PM

## 2018-10-19 NOTE — Telephone Encounter (Signed)
Reviewed AVS with Pt. Pt aware to expect to receive AVS in the mail (address on file confirmed with pt)

## 2018-10-19 NOTE — Telephone Encounter (Signed)
Cardiac Questionnaire:    Since your last visit or hospitalization:    1. Have you been having new or worsening chest pain? NO    2. Have you been having new or worsening shortness of breath? ON 3 LITERS O2. GENERALLY SOB. HAS HX COPD 3. Have you been having new or worsening leg swelling, wt gain, or increase in abdominal girth (pants fitting more tightly)? LEG SWELLING. TOOK 2 LASIX  40 MG THIS AM. 1-2 LBS WEIGHT GAIN.    4. Have you had any passing out spells? NO    *A YES to any of these questions would result in the appointment being kept. *If all the answers to these questions are NO, we should indicate that given the current situation regarding the worldwide coronarvirus pandemic, at the recommendation of the CDC, we are looking to limit gatherings in our waiting area, and thus will reschedule their appointment beyond four weeks from today.   _____________   [id number] Pre-Screening Questions:  . Do you currently have a fever? NO (yes = cancel and refer to pcp for e-visit) . Have you recently travelled on a cruise, internationally, or to [place], [place], [place], [place], [place], or [place], [place] [employer]) ? NO (yes = cancel, stay home, monitor symptoms, and contact pcp or initiate e-visit if symptoms develop) . Have you been in contact with someone that is currently pending confirmation of Covid19 testing or has been confirmed to have the [place] virus?  NO (yes = cancel, stay home, away from tested individual, monitor symptoms, and contact pcp or initiate e-visit if symptoms develop) . Are you currently experiencing fatigue or cough? NO (yes = pt should be prepared to have a mask placed at the time of their visit).         Virtual Visit Pre-Appointment Phone Call  Steps For Call:  1. Confirm consent - "In the setting of the current Covid19 crisis, you are scheduled for a (phone or video) visit with your provider on (date) at (time).  Just as we do with many in-office visits, in order for you  to participate in this visit, we must obtain consent.  If you'd like, I can send this to your mychart (if signed up) or email for you to review.  Otherwise, I can obtain your verbal consent now.  All virtual visits are billed to your insurance company just like a normal visit would be.  By agreeing to a virtual visit, we'd like you to understand that the technology does not allow for your provider to perform an examination, and thus may limit your provider's ability to fully assess your condition.  Finally, though the technology is pretty good, we cannot assure that it will always work on either your or our end, and in the setting of a video visit, we may have to convert it to a phone-only visit.  In either situation, we cannot ensure that we have a secure connection.  Are you willing to proceed?"  2. Give patient instructions for WebEx download to smartphone as below if video visit  3. Advise patient to be prepared with any vital sign or heart rhythm information, their current medicines, and a piece of paper and pen handy for any instructions they may receive the day of their visit  4. Inform patient they will receive a phone call 15 minutes prior to their appointment time (may be from unknown caller ID) so they should be prepared to answer  5. Confirm that appointment type is correct  in [facility] appointment notes (video vs telephone)    TELEPHONE CALL NOTE  Theresa Fernandez has been deemed a candidate for a follow-up tele-health visit to limit community exposure during the Covid-19 pandemic. I spoke with the patient via phone to ensure availability of phone/video source, confirm preferred email & phone number, and discuss instructions and expectations.  I reminded Theresa Fernandez to be prepared with any vital sign and/or heart rhythm information that could potentially be obtained via home monitoring, at the time of her visit. I reminded Theresa Fernandez to expect a phone call at the time of her visit if  her visit.  Did the patient verbally acknowledge consent to treatment? YES  Alexas Basulto Dawna Part, RN    DOWNLOADING THE [place], go to App Store and type in WebEx in the search bar. [place] [employer], the blue/green circle. The app is free but as with any other app downloads, their phone may require them to verify saved payment information or Apple password. The patient does NOT have to create an account.  - If Android, ask patient to go to [employer] and type in WebEx in the search bar. [place] [employer], the blue/green circle. The app is free but as with any other app downloads, their phone may require them to verify saved payment information or Android password. The patient does NOT have to create an account.   CONSENT FOR TELE-HEALTH VISIT - PLEASE REVIEW  I hereby voluntarily request, consent and authorize [facility] and its employed or contracted physicians, physician assistants, nurse practitioners or other licensed health care professionals (the Practitioner), to provide me with telemedicine health care services (the "Services") as deemed necessary by the treating Practitioner. I acknowledge and consent to receive the Services by the Practitioner via telemedicine. I understand that the telemedicine visit will involve communicating with the Practitioner through live audiovisual communication technology and the disclosure of certain medical information by electronic transmission. I acknowledge that I have been given the opportunity to request an in-person assessment or other available alternative prior to the telemedicine visit and am voluntarily participating in the telemedicine visit.  I understand that I have the right to withhold or withdraw my consent to the use of telemedicine in the course of my care at any time, without affecting my right to future care or treatment, and that the Practitioner or I may terminate the  telemedicine visit at any time. I understand that I have the right to inspect all information obtained and/or recorded in the course of the telemedicine visit and may receive copies of available information for a reasonable fee.  I understand that some of the potential risks of receiving the Services via telemedicine include:  Marland Kitchen Delay or interruption in medical evaluation due to technological equipment failure or disruption; . Information transmitted may not be sufficient (e.g. poor resolution of images) to allow for appropriate medical decision making by the Practitioner; and/or  . In rare instances, security protocols could fail, causing a breach of personal health information.  Furthermore, I acknowledge that it is my responsibility to provide information about my medical history, conditions and care that is complete and accurate to the best of my ability. I acknowledge that Practitioner's advice, recommendations, and/or decision may be based on factors not within their control, such as incomplete or inaccurate data provided by me or distortions of diagnostic images or specimens that may result from electronic transmissions. I understand that the practice  of medicine is not an Chief Strategy Officer and that Practitioner makes no warranties or guarantees regarding treatment outcomes. I acknowledge that I will receive a copy of this consent concurrently upon execution via email to the email address I last provided but may also request a printed copy by calling the office of [facility].    I understand that my insurance will be billed for this visit.   I have read or had this consent read to me. . I understand the contents of this consent, which adequately explains the benefits and risks of the Services being provided via telemedicine.  . I have been provided ample opportunity to ask questions regarding this consent and the Services and have had my questions answered to my satisfaction. . I give my informed  consent for the services to be provided through the use of telemedicine in my medical care  By participating in this telemedicine visit I agree to the above.

## 2018-10-22 ENCOUNTER — Telehealth: Payer: Self-pay | Admitting: Internal Medicine

## 2018-10-22 LAB — BASIC METABOLIC PANEL
BUN / CREAT RATIO: 21 (ref 12–28)
BUN: 34 mg/dL — ABNORMAL HIGH (ref 8–27)
CO2: 27 mmol/L (ref 20–29)
Calcium: 9.5 mg/dL (ref 8.7–10.3)
Chloride: 101 mmol/L (ref 96–106)
Creatinine, Ser: 1.62 mg/dL — ABNORMAL HIGH (ref 0.57–1.00)
GFR calc non Af Amer: 33 mL/min/{1.73_m2} — ABNORMAL LOW (ref >59)
GFR, EST AFRICAN AMERICAN: 38 mL/min/{1.73_m2} — AB (ref >59)
Glucose: 120 mg/dL — ABNORMAL HIGH (ref 65–99)
Potassium: 5.1 mmol/L (ref 3.5–5.2)
SODIUM: 145 mmol/L — AB (ref 134–144)

## 2018-10-22 LAB — BRAIN NATRIURETIC PEPTIDE: BNP: 14.4 pg/mL (ref 0.0–100.0)

## 2018-10-22 NOTE — Telephone Encounter (Signed)
New Message   Pt is returning phone call, Please call back

## 2018-10-23 ENCOUNTER — Encounter: Payer: Self-pay | Admitting: Internal Medicine

## 2018-10-23 ENCOUNTER — Telehealth: Admitting: Internal Medicine

## 2018-10-23 DIAGNOSIS — I5033 Acute on chronic diastolic (congestive) heart failure: Secondary | ICD-10-CM

## 2018-10-23 MED ORDER — METOLAZONE 2.5 MG PO TABS: 2.5000 mg | ORAL_TABLET | ORAL | 3 refills | Status: DC

## 2018-10-23 NOTE — Progress Notes (Signed)
Virtual Visit via Telephone Note    Evaluation Performed:  New patient visit for diastolic CHF  This visit type was conducted due to national recommendations for restrictions regarding the COVID-19 Pandemic (e.g. social distancing).  This format is felt to be most appropriate for this patient at this time.  All issues noted in this document were discussed and addressed.  No physical exam was performed (except for noted visual exam findings with Video Visits).  Please refer to the patient's chart (MyChart message for video visits and phone note for telephone visits) for the patient's consent to telehealth for [facility].  Date:  10/23/2018   ID:  Theresa Fernandez, DOB 09/13/1955, MRN [id number]  Patient Location:  [place] [address]  Provider location:   [address] [place], [place] [address]  PCP:  [facility] [place]  Cardiologist:  No primary care provider on file. Electrophysiologist:  None   Chief Complaint:  Follow-up diastolic HF exacerbation  History of Present Illness:    Theresa Fernandez is a 63 y.o. female who presents via audio/video conferencing for a telehealth visit today.  This is a pleasant 63 year old female (whose mother is a patient of mine), kindly referred by Dr. Valeta Harms with a history of tobacco abuse, COPD, OSA (not on CPAP), type 2 diabetes on lisinopril for renal protection, questionable history of hypertension, diastolic heart failure with recent exacerbation in November 2019.  She had an echocardiogram at that time which I personally reviewed that showed an LVEF of 60 to 31%, grade 1 diastolic dysfunction and indeterminate LV filling pressure, mild aortic sclerosis with trivial aortic insufficiency.  The left atrium was normal size.  RV was normal in size and function.  During that hospitalization she was diuresed with IV Lasix and lost 20 pounds.  After discharge she felt well and was able to lay flat.  She had no  significant edema.  She was seen by Dr. Valeta Harms on 08/24/2018, and her weight at the time was 247 pounds.  It is now up to 258 pounds.  She says she is actually gained 8 pounds in the last week.  Despite this, her diet has been stable if not demonstrated a reduction in calories to try to intentionally lose weight.  She reports increasing swelling in her legs, in fact she has pitting edema to below the knee.  She has been short of breath, particularly orthopneic, having to sleep upright in a recliner.  She says at times she feels like she may stop breathing.  She was scheduled for a sleep study however she had a URI and the sleep study was postponed.  She denies any chest pain.  She also has stage III chronic kidney disease.  In December, her creatinine had increased to 1.71 up from 1.25-1.35, 4 months prior.  She did say she had lab work at [facility] a few weeks ago however we were not able to bring that up in the [facility] and the labs are not currently available.  10/23/2018  [place] reports improvement in her swelling. Weight is down 2 lbs. She has not started metolazone due to the medicine being out of stock at her pharmacy. Labs show improvement in BNP which is now near normal and creatinine has improved with diuresis.  The patient does not have symptoms concerning for COVID-19 infection (fever, chills, cough, or new SHORTNESS OF BREATH).    Prior CV studies:   The following studies were reviewed today:  Echo 06/08/2018  Hi-Res chest CT - 08/04/2018: 2 vessel coronary calcification of the LAD and RCA with aortic atherosclerosis, mild emphysema and possible smoking-related interstitial lung disease  PMHx:  Past Medical History:  Diagnosis Date   Allergy    Anxiety    Arthritis    COPD (chronic obstructive pulmonary disease) ([place])    Depression    Diabetes mellitus without complication ([place])    HTN (hypertension) 01/12/2013    Past Surgical History:  Procedure Laterality  Date   TUBAL LIGATION      FAMHx:  Family History  Problem Relation Age of Onset   Diabetes Mother    Alcohol abuse Father    Diabetes Father     SOCHx:   reports that she quit smoking about 4 months ago. Her smoking use included cigarettes. She has a 150.00 pack-year smoking history. She has never used smokeless tobacco. She reports that she does not drink alcohol or use drugs.  ALLERGIES:  Allergies  Allergen Reactions   Ceftriaxone Hives and Rash    After second dose on 06/07/2018,   Topamax [Topiramate]    Voltaren [Diclofenac Sodium]     migraine    MEDS:  Current Meds  Medication Sig   albuterol (PROVENTIL HFA;VENTOLIN HFA) 108 (90 Base) MCG/ACT inhaler Inhale 2 puffs into the lungs every 6 (six) hours as needed for wheezing or shortness of breath (wheezing).   albuterol (PROVENTIL) (2.5 MG/3ML) 0.083% nebulizer solution Take 2.5 mg by nebulization every 6 (six) hours as needed for wheezing or shortness of breath (wheezing).    amphetamine-dextroamphetamine (ADDERALL) 20 MG tablet Take 1 tablet (20 mg total) by mouth 3 (three) times daily. (Patient taking differently: Take 20 mg by mouth 3 (three) times daily. Take 1-2 tablets daily)   budesonide-formoterol (SYMBICORT) 80-4.5 MCG/ACT inhaler Inhale 2 puffs into the lungs 2 (two) times daily.   busPIRone (BUSPAR) 15 MG tablet Take 15 mg by mouth 2 (two) times daily.    furosemide (LASIX) 40 MG tablet Take 1 tablet (40 mg total) by mouth 2 (two) times daily.   HYDROcodone-acetaminophen (NORCO) 10-325 MG tablet Take 1 tablet by mouth every 5 (five) hours as needed.   hydrOXYzine (ATARAX/VISTARIL) 25 MG tablet Take 25 mg by mouth 3 (three) times daily.   lamoTRIgine (LAMICTAL) 25 MG tablet Take 25 mg by mouth daily.   lisinopril (PRINIVIL,ZESTRIL) 2.5 MG tablet Take 2.5 mg by mouth daily.   magnesium gluconate (MAGONATE) 500 MG tablet Take 1 tablet (500 mg total) by mouth 2 (two) times daily.   metolazone  (ZAROXOLYN) 2.5 MG tablet Take 1 tablet (2.5 mg total) by mouth every other day. Start this medication on 10/20/2018 in the morning (Patient taking differently: Take 2.5 mg by mouth every other day. Start this medication on 10/20/2018 in the morning Has not picked up yet)   omeprazole (PRILOSEC) 40 MG capsule Take 40 mg by mouth daily.   pioglitazone (ACTOS) 30 MG tablet Take 1 tablet (30 mg total) by mouth daily.   potassium gluconate 595 (99 K) MG TABS tablet Take 595 mg by mouth daily.   pravastatin (PRAVACHOL) 20 MG tablet Take 1 tablet (20 mg total) by mouth daily at 6 PM.   pregabalin (LYRICA) 150 MG capsule Take 1 capsule (150 mg total) by mouth at bedtime. (Patient taking differently: Take 100 mg by mouth 2 (two) times daily. )   sertraline (ZOLOFT) 100 MG tablet Take 100 mg by mouth 2 (two) times daily.    [  DISCONTINUED] potassium chloride SA (K-DUR,KLOR-CON) 20 MEQ tablet Take 2 tablets (40 mEq total) by mouth daily.     ROS: Pertinent items noted in HPI and remainder of comprehensive ROS otherwise negative.  Labs/Other Tests and Data Reviewed:    Recent Labs: 06/08/2018: ALT 15 06/09/2018: Magnesium 2.2 06/25/2018: Hemoglobin 11.6; Platelets 247 07/20/2018: Pro B Natriuretic peptide (BNP) 38.0 10/21/2018: BNP 14.4; BUN 34; Creatinine, Ser 1.62; Potassium 5.1; Sodium 145   Recent Lipid Panel Lab Results  Component Value Date/Time   CHOL 208 (H) 02/11/2013 09:22 AM   TRIG 132 02/11/2013 09:22 AM   HDL 47 02/11/2013 09:22 AM   CHOLHDL 4.4 02/11/2013 09:22 AM   LDLCALC 135 (H) 02/11/2013 09:22 AM    Wt Readings from Last 3 Encounters:  10/23/18 256 lb (116.1 kg)  10/19/18 258 lb (117 kg)  09/02/18 249 lb (112.9 kg)     Exam:    Vital Signs:  Ht 5\' 5"  (1.651 m)    Wt 256 lb (116.1 kg)    BMI 42.60 kg/m    Telephone visit, no exam  ASSESSMENT & PLAN:    1.  Acute on chronic diastolic CHF, LVEF 54-65% 2.  CKD 3 3.  DM2, uncontrolled 4.  COPD w/interstitial  disease 5.  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  Hypokalemia  Mrs. Madarang is doing much better today. Weight is down 2 lbs- edema has improved. Unfortunately, she did not start metolazone as her pharmacy had to order the medication. She is only on lasix 40 mg BID. Labs re-checked show low BNP of 14 and improvement in creatinine - this suggests that she is likely euvolemic at this point. I'm recommending that we only use the metolazone 2.5 mg PRN for weight gain of >2 lbs over 2-3 days and continue lasix 40 mg BID for now.  Follow-up with me in 3 months.  COVID-19 Education: The signs and symptoms of COVID-19 were discussed with the patient and how to seek care for testing (follow up with PCP or arrange E-visit).  The importance of social distancing was discussed today.  Patient Risk:   After full review of this patients clinical status, I feel that they are at least moderate risk at this time.  Time:   Today, I have spent 15 minutes with the patient with telehealth technology discussing diastolic heart failure, signs and symptoms of worsening heart failure, what to monitor and the treatment recommendations..     Medication Adjustments/Labs and Tests Ordered: Current medicines are reviewed at length with the patient today.  Concerns regarding medicines are outlined above.   Tests Ordered: No orders of the defined types were placed in this encounter.   Medication Changes: No orders of the defined types were placed in this encounter.   Disposition:  in 3 month(s)  [name], MD, [facility], [place] Director of the Advanced Lipid Disorders &  Cardiovascular Risk Reduction Clinic Diplomate of the American Board of Clinical Lipidology Attending Cardiologist  Direct Dial: [phone]   Fax: [phone]  Website:  [email or web address]  [name], MD  10/23/2018 11:37 AM

## 2018-10-23 NOTE — Telephone Encounter (Signed)
Patient returned call yesterday (10/22/18). Results reviewed. Scheduled telehealth visit for today, 10/23/18, at 11:30a.

## 2018-10-23 NOTE — Patient Instructions (Signed)
Medication Instructions:  ONLY TAKE METAL OZONE AS NEEDED FOR WEIGHT GAIN OVER 2 LBS  If you need a refill on your cardiac medications before your next appointment, please call your pharmacy.   Lab work: If you have labs (blood work) drawn today and your tests are completely normal, you will receive your results only by: Marland Kitchen MyChart Message (if you have MyChart) OR . A paper copy in the mail If you have any lab test that is abnormal or we need to change your treatment, we will call you to review the results.  Follow-Up: At [facility], you and your health needs are our priority.  As part of our continuing mission to provide you with exceptional heart care, we have created designated Provider Care Teams.  These Care Teams include your primary Cardiologist (physician) and Advanced Practice Providers (APPs -  Physician Assistants and Nurse Practitioners) who all work together to provide you with the care you need, when you need it. Your physician recommends that you schedule a follow-up appointment in: [place]

## 2018-11-23 ENCOUNTER — Encounter (HOSPITAL_BASED_OUTPATIENT_CLINIC_OR_DEPARTMENT_OTHER)

## 2018-11-24 ENCOUNTER — Ambulatory Visit: Admitting: Primary Care

## 2018-11-27 ENCOUNTER — Ambulatory Visit: Admitting: Primary Care

## 2018-11-27 NOTE — Progress Notes (Deleted)
@Patient  ID: Theresa Fernandez, female    DOB: [birth year], 63 y.o.   MRN: [id number]  No chief complaint on file.   Referring provider: [facility]  HPI: 63 year old female, former smoker quit 05/2018 (50 pack year hx). PMH significant for COPD, OSA no currently on CPAP, acute on chronic HF with preserved EF, HTN, DM2, CKD stage 3. Patient of Dr. Valeta Harms, last seen on 08/24/18. Maintained on Spiriva Respimat and add Symbicort. Enrolled in lung cancer screening program. Saw Dr. Ander Slade in February for sleep consult, scheduled for split-night sleep study which is pending. She was also referred to cariology.   11/27/2018 Patient presents today for 3 follow-up visit for OSA.    Has an apt in July with Dr. Debara Pickett.   Testing reviewed: PFTs 07/26/2018 - FVC 1.87 (61%), FEV1 1.5 (64%), RATIO 80, +mid-flow reversibility, moderate DLCO defect  Echocardiogram in Nov 2019- normal ejection fraction and grade 1 dd Allergies  Allergen Reactions  . Ceftriaxone Hives and Rash    After second dose on 06/07/2018,  . Topamax [Topiramate]   . Voltaren [Diclofenac Sodium]     migraine    Immunization History  Administered Date(s) Administered  . Influenza,inj,Quad PF,6+ Mos 06/08/2018  . Pneumococcal Polysaccharide-23 06/08/2018    Past Medical History:  Diagnosis Date  . Allergy   . Anxiety   . Arthritis   . COPD (chronic obstructive pulmonary disease) ([place])   . Depression   . Diabetes mellitus without complication ([place])   . HTN (hypertension) 01/12/2013    Tobacco History: Social History   Tobacco Use  Smoking Status Former Smoker  . Packs/day: 3.00  . Years: 50.00  . Pack years: 150.00  . Types: Cigarettes  . Last attempt to quit: 05/30/2018  . Years since quitting: 0.4  Smokeless Tobacco Never Used   Counseling given: Not Answered   Outpatient Medications Prior to Visit  Medication Sig Dispense Refill  . albuterol (PROVENTIL HFA;VENTOLIN HFA) 108 (90 Base) MCG/ACT  inhaler Inhale 2 puffs into the lungs every 6 (six) hours as needed for wheezing or shortness of breath (wheezing). 1 Inhaler 5  . albuterol (PROVENTIL) (2.5 MG/3ML) 0.083% nebulizer solution Take 2.5 mg by nebulization every 6 (six) hours as needed for wheezing or shortness of breath (wheezing).     Marland Kitchen amphetamine-dextroamphetamine (ADDERALL) 20 MG tablet Take 1 tablet (20 mg total) by mouth 3 (three) times daily. (Patient taking differently: Take 20 mg by mouth 3 (three) times daily. Take 1-2 tablets daily) 90 tablet 0  . budesonide-formoterol (SYMBICORT) 80-4.5 MCG/ACT inhaler Inhale 2 puffs into the lungs 2 (two) times daily. 1 Inhaler 2  . busPIRone (BUSPAR) 15 MG tablet Take 15 mg by mouth 2 (two) times daily.     . furosemide (LASIX) 40 MG tablet Take 1 tablet (40 mg total) by mouth 2 (two) times daily. 90 tablet 3  . HYDROcodone-acetaminophen (NORCO) 10-325 MG tablet Take 1 tablet by mouth every 5 (five) hours as needed.    . hydrOXYzine (ATARAX/VISTARIL) 25 MG tablet Take 25 mg by mouth 3 (three) times daily.    Marland Kitchen lamoTRIgine (LAMICTAL) 25 MG tablet Take 25 mg by mouth daily.    Marland Kitchen lisinopril (PRINIVIL,ZESTRIL) 2.5 MG tablet Take 2.5 mg by mouth daily.    . magnesium gluconate (MAGONATE) 500 MG tablet Take 1 tablet (500 mg total) by mouth 2 (two) times daily. 60 tablet 3  . metolazone (ZAROXOLYN) 2.5 MG tablet Take 1 tablet (2.5 mg total) by  mouth every other day. As needed for weight gain of 2 lbs 45 tablet 3  . omeprazole (PRILOSEC) 40 MG capsule Take 40 mg by mouth daily.    . pioglitazone (ACTOS) 30 MG tablet Take 1 tablet (30 mg total) by mouth daily. 30 tablet 3  . potassium gluconate 595 (99 K) MG TABS tablet Take 595 mg by mouth daily.    . pravastatin (PRAVACHOL) 20 MG tablet Take 1 tablet (20 mg total) by mouth daily at 6 PM. 30 tablet 0  . pregabalin (LYRICA) 150 MG capsule Take 1 capsule (150 mg total) by mouth at bedtime. (Patient taking differently: Take 100 mg by mouth 2 (two)  times daily. ) 30 capsule 3  . sertraline (ZOLOFT) 100 MG tablet Take 100 mg by mouth 2 (two) times daily.      No facility-administered medications prior to visit.       Review of Systems  Review of Systems   Physical Exam  There were no vitals taken for this visit. Physical Exam   Lab Results:  CBC    Component Value Date/Time   WBC 10.0 06/25/2018 1227   RBC 3.95 06/25/2018 1227   HGB 11.6 (L) 06/25/2018 1227   HCT 39.4 06/25/2018 1227   PLT 247 06/25/2018 1227   MCV 99.7 06/25/2018 1227   MCH 29.4 06/25/2018 1227   MCHC 29.4 (L) 06/25/2018 1227   RDW 13.5 06/25/2018 1227   LYMPHSABS 1.8 06/06/2018 1458   MONOABS 0.6 06/06/2018 1458   EOSABS 0.3 06/06/2018 1458   BASOSABS 0.1 06/06/2018 1458    BMET    Component Value Date/Time   NA 145 (H) 10/21/2018 1431   K 5.1 10/21/2018 1431   CL 101 10/21/2018 1431   CO2 27 10/21/2018 1431   GLUCOSE 120 (H) 10/21/2018 1431   GLUCOSE 93 07/20/2018 1658   BUN 34 (H) 10/21/2018 1431   CREATININE 1.62 (H) 10/21/2018 1431   CREATININE 0.89 02/11/2013 0922   CALCIUM 9.5 10/21/2018 1431   GFRNONAA 33 (L) 10/21/2018 1431   GFRNONAA 72 02/11/2013 0922   GFRAA 38 (L) 10/21/2018 1431   GFRAA 83 02/11/2013 0922    BNP    Component Value Date/Time   BNP 14.4 10/21/2018 1430   BNP 63.8 06/25/2018 1227    ProBNP    Component Value Date/Time   PROBNP 38.0 07/20/2018 1658    Imaging: No results found.   Assessment & Plan:   No problem-specific Assessment & Plan notes found for this encounter.     Martyn Ehrich, NP 11/27/2018

## 2018-12-02 ENCOUNTER — Ambulatory Visit: Admitting: Pulmonary Disease

## 2018-12-08 ENCOUNTER — Telehealth: Payer: Self-pay | Admitting: Pulmonary Disease

## 2018-12-08 NOTE — Telephone Encounter (Signed)
Called pt back made aware detailed rx has been requested from [facility]. Once signed by [facility] then office will fax back. Made patient aware since this is a written order that must be signed we don't know when BI will be in office. Routing message to make aware be on look out for rx.

## 2018-12-08 NOTE — Telephone Encounter (Signed)
Pt is on 3 L continuous 02. She wants an [employer] in which she has already submitted payment. Pt needs [employer] rx. [facility] is supplier Spoke with Jeneen Rinks  He will be faxing order over. Called pt and made aware since she is on continuous she may need appt for POC.

## 2018-12-08 NOTE — Telephone Encounter (Signed)
PCCM:  Maybe we can ask BM to see if he will sign the POC for me?   Garner Nash, DO [facility] 12/08/2018 3:25 PM

## 2018-12-08 NOTE — Telephone Encounter (Signed)
Duplicate message.      Closing encounter.

## 2018-12-08 NOTE — Telephone Encounter (Signed)
Yes I will sign.  Please coordinate and send order to me.  Please notify the patient.  Please ensure the patient has the ability to monitor oxygen saturations at home with a pulse oximeter.  Oxygen saturations must remain above 88%.  We need to be mindful to be sending these orders to APP's or coordinating office visits for the patient so that we can address this.  We cannot send messages to MDs were not working in the office full-time as it makes it very difficult for the patient's needs to be addressed.  Continue oxygen therapy as prescribed  >>>maintain oxygen saturations greater than 88 percent  >>>if unable to maintain oxygen saturations please contact the office  >>>do not smoke with oxygen  >>>can use nasal saline gel or nasal saline rinses to moisturize nose if oxygen causes dryness    Wyn Quaker FNP

## 2018-12-08 NOTE — Telephone Encounter (Signed)
Pt aware already that once rx received we will sign. Pt aware: 02 lever greater than 88% If unable to maintain contact office Do not smoke with 02.

## 2018-12-09 NOTE — Telephone Encounter (Addendum)
Rx received and placed in [facility] folder for signature. Once signed will fax back to [facility].

## 2018-12-11 NOTE — Telephone Encounter (Signed)
Call made to patient, made aware her paperwork will be faxed today. Made aware once we have a update we will let her know. Inquired as to whether we are closed Monday, made aware we are closed. Voiced understanding. Will route message to nurse to hold for f/u.

## 2018-12-11 NOTE — Telephone Encounter (Signed)
12/11/2018 0940  Forms have been signed.  Will be faxed today.  Wyn Quaker FNP

## 2018-12-23 NOTE — Telephone Encounter (Signed)
Call made to patient to check status of POC, she states her insurance would not cover it and she had to purchase it out of pocket. Will close this encounter. Nothing further is needed at this time.

## 2018-12-23 NOTE — Telephone Encounter (Signed)
Call made to patient, she states her insurance would not cover ut

## 2019-02-08 ENCOUNTER — Ambulatory Visit: Admitting: Internal Medicine

## 2019-08-05 ENCOUNTER — Other Ambulatory Visit: Payer: Self-pay | Admitting: *Deleted

## 2019-08-05 DIAGNOSIS — Z87891 Personal history of nicotine dependence: Secondary | ICD-10-CM

## 2019-08-17 ENCOUNTER — Telehealth: Payer: Self-pay | Admitting: Acute Care

## 2019-08-19 NOTE — Telephone Encounter (Signed)
LMTC x 1  

## 2019-08-23 ENCOUNTER — Encounter: Admitting: Acute Care

## 2019-08-23 ENCOUNTER — Telehealth: Payer: Self-pay | Admitting: Acute Care

## 2019-08-23 ENCOUNTER — Ambulatory Visit

## 2019-08-23 NOTE — Telephone Encounter (Signed)
Pt is calling to see if anyone had made a decision regarding cancelling her appt for shared decision visit and the CT screening - needs to know fi she needs to make a TELEVISIT and reschedule the CT - CB# [phone]

## 2019-08-24 NOTE — Telephone Encounter (Signed)
Spoke with pt regarding rescheduling lung screening. Advised pt that since she is having some sneezing and cough that we will not be able to reschedule CT at this time. Pt requests to reschedule for a few weeks out to see if symptoms go away. [facility] rescheduled for 09/06/19 3:30 CT will be rescheduled Nothing further needed.

## 2019-08-24 NOTE — Telephone Encounter (Signed)
Spoke with pt and rescheduled [facility] 09/06/19 3:30 CT will be rescheduled Nothing further needed

## 2019-08-30 ENCOUNTER — Other Ambulatory Visit: Payer: Self-pay | Admitting: Pulmonary Disease

## 2019-08-30 DIAGNOSIS — G4733 Obstructive sleep apnea (adult) (pediatric): Secondary | ICD-10-CM

## 2019-08-31 ENCOUNTER — Ambulatory Visit

## 2019-09-06 ENCOUNTER — Ambulatory Visit
Admission: RE | Admit: 2019-09-06 | Discharge: 2019-09-06 | Disposition: A | Discharge status: Home / Self Care | Payer: Medicare Other | Source: Ambulatory Visit | Attending: Acute Care | Admitting: Acute Care

## 2019-09-06 ENCOUNTER — Other Ambulatory Visit: Payer: Self-pay

## 2019-09-06 ENCOUNTER — Ambulatory Visit (INDEPENDENT_AMBULATORY_CARE_PROVIDER_SITE_OTHER): Payer: Medicare Other | Admitting: Acute Care

## 2019-09-06 ENCOUNTER — Encounter: Payer: Self-pay | Admitting: Acute Care

## 2019-09-06 ENCOUNTER — Ambulatory Visit: Admission: RE | Admit: 2019-09-06 | Source: Ambulatory Visit

## 2019-09-06 VITALS — BP 118/76 | HR 83 | Temp 97.0°F | Ht 65.5 in | Wt 269.0 lb

## 2019-09-06 DIAGNOSIS — Z87891 Personal history of nicotine dependence: Secondary | ICD-10-CM | POA: Diagnosis not present

## 2019-09-06 IMAGING — CT CT CHEST LUNG CANCER SCREENING LOW DOSE W/O CM
1 of 2 series · 10 of 40 positions shown, 13 images · non-contrast
Comparison: High-resolution CT chest [DATE] and [DATE]

CLINICAL DATA: Lung cancer screening. Former smoker. Asymptomatic.
Ninety-four pack-year history.

EXAM:
CT CHEST WITHOUT CONTRAST LOW-DOSE FOR LUNG CANCER SCREENING
TECHNIQUE: Multidetector CT imaging of the chest was performed following the
standard protocol without IV contrast.

[ct lung segmentation data · axial · 0.71mm/px · z∈[-318,-318]mm · 10 of 269 frames shown]
[frame 1/269  mediastinal]
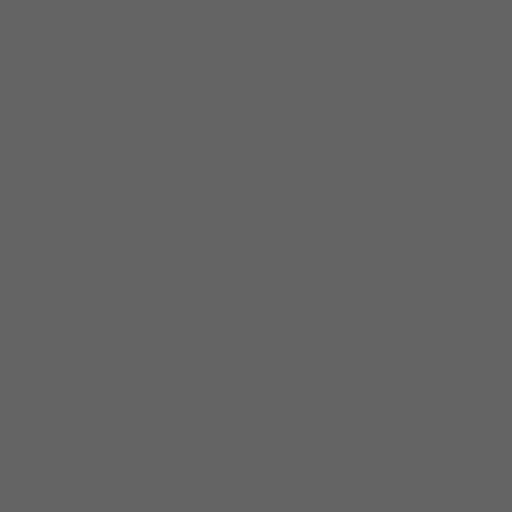
[frame 1/269  lung]
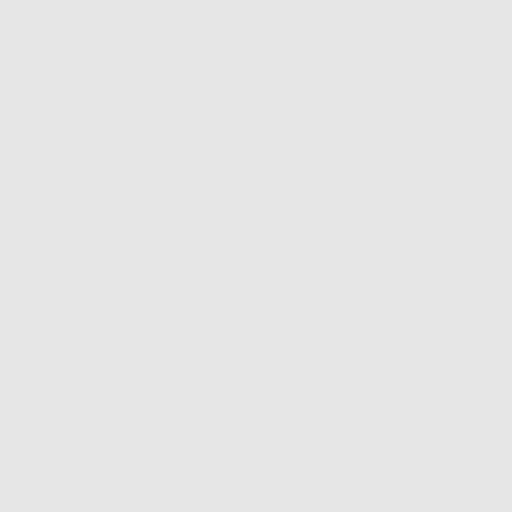
[frame 30/269  lung]
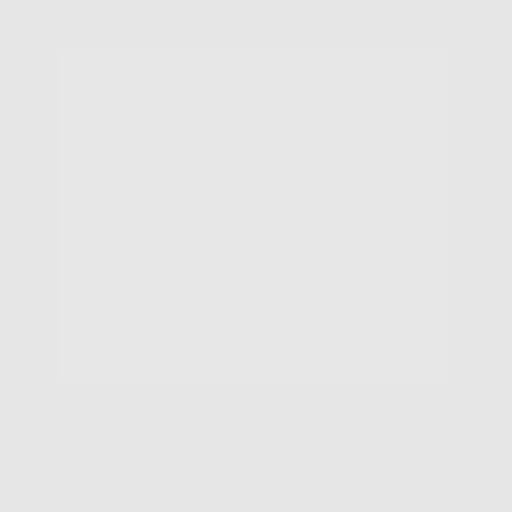
[frame 60/269  lung]
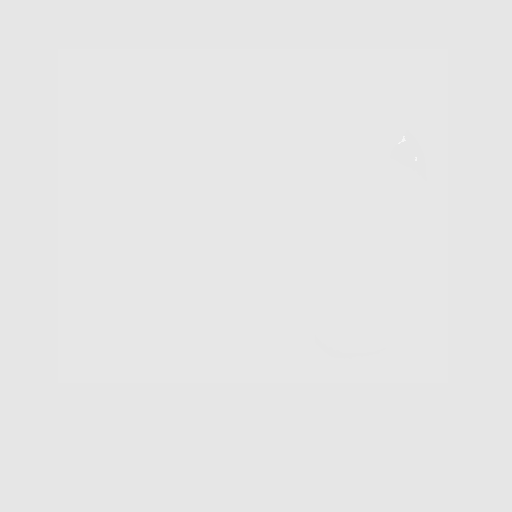
[frame 90/269  lung]
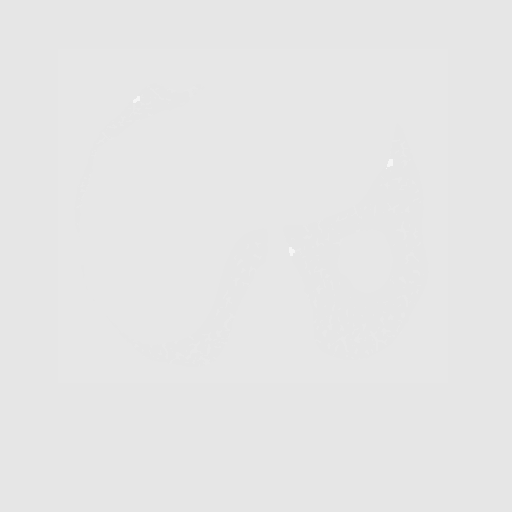
[frame 120/269  mediastinal]
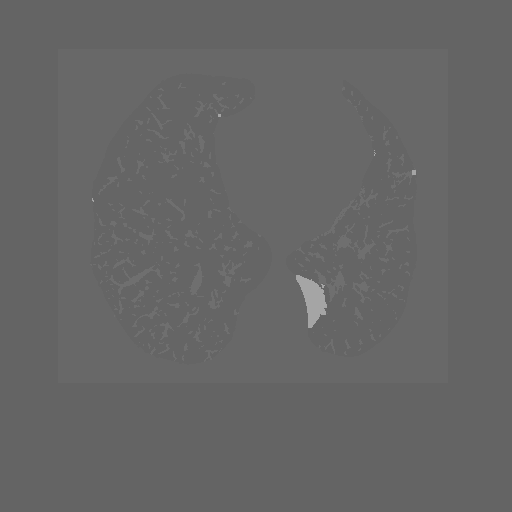
[frame 120/269  lung]
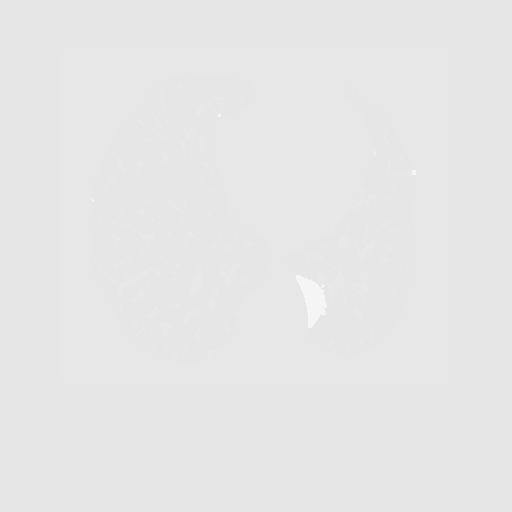
[frame 149/269  lung]
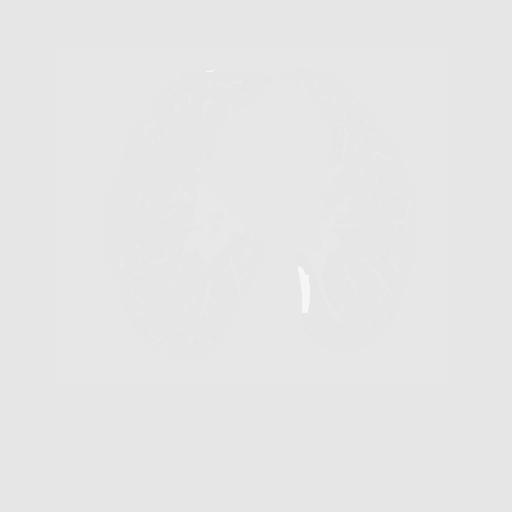
[frame 179/269  lung]
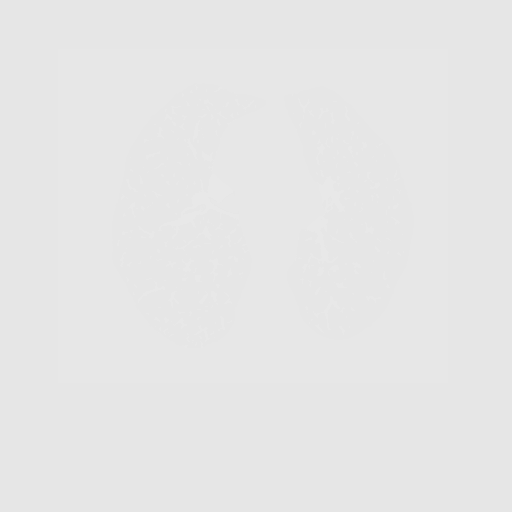
[frame 209/269  lung]
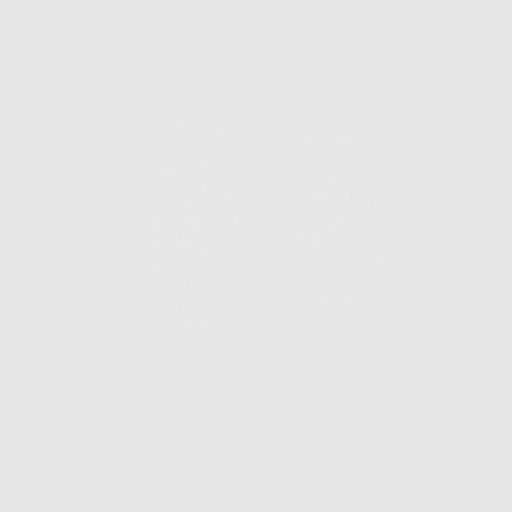
[frame 239/269  mediastinal]
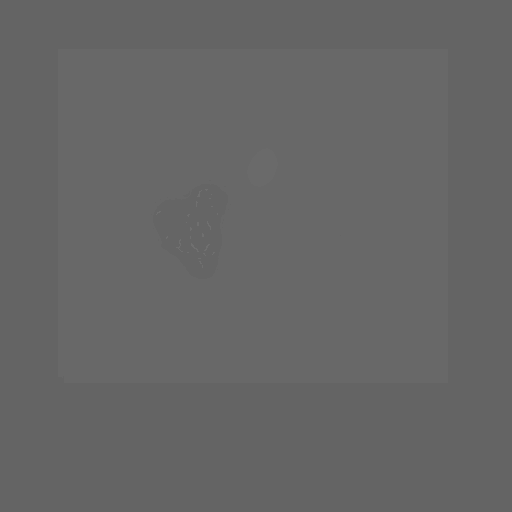
[frame 239/269  lung]
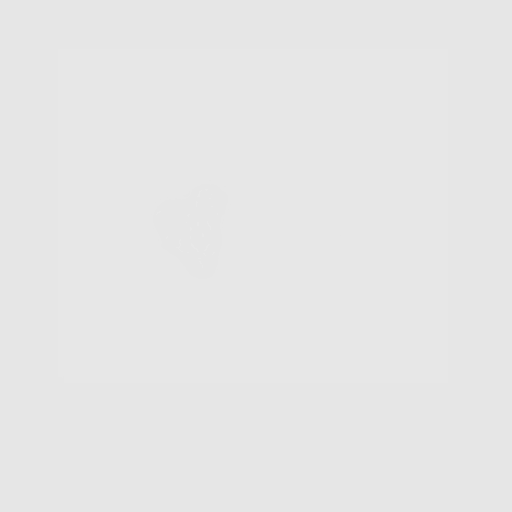
[frame 269/269  lung]
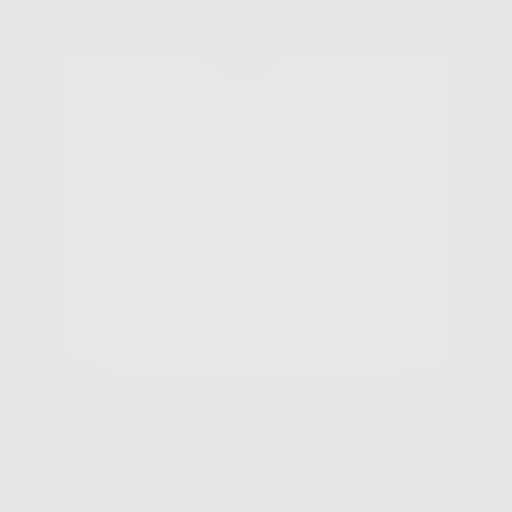

[10 of 40 positions shown; findings below may reference images not displayed]

FINDINGS: Cardiovascular: Heart size normal. No pericardial effusion. Aortic
atherosclerosis. Calcifications in the RCA, LAD and left circumflex
coronary arteries.

Mediastinum/Nodes: Normal appearance of the thyroid gland. The
trachea appears patent and is midline. Normal appearance of the
esophagus.

Lungs/Pleura: No pleural effusion identified. Mild centrilobular and
paraseptal emphysema. Again seen are chronic changes including mild
interstitial reticulation and traction bronchiectasis. Upper lobe
predominant areas of interstitial scarring and reticulation are also
noted. Several tiny lung nodules are identified. The largest is in
the lingula measuring 3.4 mm.

Upper Abdomen: No acute abnormality. Fatty infiltration of the
pancreas is noted.

Musculoskeletal: Osteopenia. Chronic deformity of the proximal body
sacrum. There are numerous compression deformities noted throughout
the thoracic spine. Treated fractures noted at T7, T8 and T10.
Untreated compression deformities are involving T3, T5, T6, T9, and
L1 compression deformity are unchanged from [DATE]. Mild scratch
set moderate superior endplate fracture at T12 is new from
[DATE] but unchanged from [DATE].
IMPRESSION: 1. Lung-RADS 2, benign appearance or behavior. Continue annual
screening with low-dose chest CT without contrast in 12 months.
2. Emphysema and aortic atherosclerosis.
3. Osteopenia and extensive thoracolumbar compression deformities as
described above.
4. Multi vessel coronary artery atherosclerotic calcifications.

Aortic Atherosclerosis ([3Z]-[3Z]) and Emphysema ([3Z]-[3Z]).

## 2019-09-06 NOTE — Patient Instructions (Signed)
Thank you for participating in the [facility]. It was our pleasure to meet you today. We will call you with the results of your scan within the next few days. Your scan will be assigned a Lung RADS category score by the physicians reading the scans.  This Lung RADS score determines follow up scanning.  See below for description of categories, and follow up screening recommendations. We will be in touch to schedule your follow up screening annually or based on recommendations of our providers. We will fax a copy of your scan results to your Primary Care Physician, or the physician who referred you to the program, to ensure they have the results. Please call the office if you have any questions or concerns regarding your scanning experience or results.  Our office number is [phone]. Please speak with Denise Phelps, RN. She is our Lung Cancer Screening RN. If she is unavailable when you call, please have the office staff send her a message. She will return your call at her earliest convenience. Remember, if your scan is normal, we will scan you annually as long as you continue to meet the criteria for the program. (Age 55-77, Current smoker or smoker who has quit within the last 15 years). If you are a smoker, remember, quitting is the single most powerful action that you can take to decrease your risk of lung cancer and other pulmonary, breathing related problems. We know quitting is hard, and we are here to help.  Please let us know if there is anything we can do to help you meet your goal of quitting. If you are a former smoker, congratulations. We are proud of you! Remain smoke free! Remember you can refer friends or family members through the number above.  We will screen them to make sure they meet criteria for the program. Thank you for helping us take better care of you by participating in Lung Screening.  Lung RADS Categories:  Lung RADS 1: no nodules  or definitely non-concerning nodules.  Recommendation is for a repeat annual scan in 12 months.  Lung RADS 2:  nodules that are non-concerning in appearance and behavior with a very low likelihood of becoming an active cancer. Recommendation is for a repeat annual scan in 12 months.  Lung RADS 3: nodules that are probably non-concerning , includes nodules with a low likelihood of becoming an active cancer.  Recommendation is for a 6-month repeat screening scan. Often noted after an upper respiratory illness. We will be in touch to make sure you have no questions, and to schedule your 6-month scan.  Lung RADS 4 A: nodules with concerning findings, recommendation is most often for a follow up scan in 3 months or additional testing based on our provider's assessment of the scan. We will be in touch to make sure you have no questions and to schedule the recommended 3 month follow up scan.  Lung RADS 4 B:  indicates findings that are concerning. We will be in touch with you to schedule additional diagnostic testing based on our provider's  assessment of the scan.   

## 2019-09-06 NOTE — Progress Notes (Signed)
Shared Decision Making Visit Lung Cancer Screening Program [phone])   Eligibility:  Age 64 y.o.  Pack Years Smoking History Calculation 150  pack year smoking history (# packs/per year x # years smoked)  Recent History of coughing up blood  no  Unexplained weight loss? no ( >Than 15 pounds within the last 6 months )  Prior History Lung / other cancer no (Diagnosis within the last 5 years already requiring surveillance chest CT Scans).  Smoking Status Former Smoker  Former Smokers: Years since quit: 2 years  Quit Date: 2019  Visit Components:  Discussion included one or more decision making aids. yes  Discussion included risk/benefits of screening. yes  Discussion included potential follow up diagnostic testing for abnormal scans. yes  Discussion included meaning and risk of over diagnosis. yes  Discussion included meaning and risk of False Positives. yes  Discussion included meaning of total radiation exposure. yes  Counseling Included:  Importance of adherence to annual lung cancer LDCT screening. yes  Impact of comorbidities on ability to participate in the program. yes  Ability and willingness to under diagnostic treatment. yes  Smoking Cessation Counseling:  Current Smokers:   Discussed importance of smoking cessation. yes  Information about tobacco cessation classes and interventions provided to patient. yes  Patient provided with "ticket" for LDCT Scan. yes  Symptomatic Patient. no  Counseling  Diagnosis Code: Tobacco Use Z72.0  Asymptomatic Patient yes  Counseling (Intermediate counseling: > three minutes counseling) [id number]  Former Smokers:   Discussed the importance of maintaining cigarette abstinence. yes  Diagnosis Code: Personal History of Nicotine Dependence. [id number]  Information about tobacco cessation classes and interventions provided to patient. Yes  Patient provided with "ticket" for LDCT Scan. yes  Written Order for Lung Cancer  Screening with LDCT placed in Epic. Yes (CT Chest Lung Cancer Screening Low Dose W/O CM) [id number] Z12.2-Screening of respiratory organs Z87.891-Personal history of nicotine dependence  BP 118/76 (BP Location: Left Arm, Cuff Size: Normal)   Pulse 83   Temp (!) 97 F (36.1 C) (Temporal)   Ht 5' 5.5" (1.664 m)   Wt 269 lb (122 kg)   SpO2 96%   BMI 44.08 kg/m    I spent 25 minutes of face to face time with Theresa Fernandez discussing the risks and benefits of lung cancer screening. We viewed a power point together that explained in detail the above noted topics. We took the time to pause the power point at intervals to allow for questions to be asked and answered to ensure understanding. We discussed that she had taken the single most powerful action possible to decrease her risk of developing lung cancer when she quit smoking. I counseled her to remain smoke free, and to contact me if she ever had the desire to smoke again so that I can provide resources and tools to help support the effort to remain smoke free. We discussed the time and location of the scan, and that either  Doroteo Glassman RN or I will call with the results within  24-48 hours of receiving them. She has my card and contact information in the event she needs to speak with me, in addition to a copy of the power point we reviewed as a resource. She verbalized understanding of all of the above and had no further questions upon leaving the office.     I explained to the patient that there has been a high incidence of coronary artery disease noted on these exams. I explained  that this is a non-gated exam therefore degree or severity cannot be determined. This patient is currently on statin therapy. I have asked the patient to follow-up with their PCP regarding any incidental finding of coronary artery disease and management with diet or medication as they feel is clinically indicated. The patient verbalized understanding of the above and had  no further questions.     Theresa Spatz, NP 09/06/2019 4:13 PM

## 2019-09-08 NOTE — Progress Notes (Signed)
Please call patient and let them  know their  low dose Ct was read as a Lung RADS 2: nodules that are benign in appearance and behavior with a very low likelihood of becoming a clinically active cancer due to size or lack of growth. Recommendation per radiology is for a repeat LDCT in 12 months. Please let them  know we will order and schedule their  annual screening scan for 08/2020. Please let them  know there was notation of CAD on their  scan.  Please remind the patient  that this is a non-gated exam therefore degree or severity of disease  cannot be determined. Please have them  follow up with their PCP regarding potential risk factor modification, dietary therapy or pharmacologic therapy if clinically indicated. Pt.  is  currently on statin therapy. Please place order for annual  screening scan for  08/2020 and fax results to PCP. Thanks so much. 

## 2019-09-13 ENCOUNTER — Other Ambulatory Visit: Payer: Self-pay | Admitting: *Deleted

## 2019-09-13 DIAGNOSIS — Z87891 Personal history of nicotine dependence: Secondary | ICD-10-CM

## 2019-11-12 ENCOUNTER — Telehealth: Payer: Self-pay | Admitting: Pulmonary Disease

## 2019-11-12 NOTE — Telephone Encounter (Signed)
Spoke with Melissa. She stated that the patient switched insurance to [facility] on February 1st. Because of this, she will need to have an OV, walk and order for O2 to be sent to [employer] for insurances purposes. Advised Melissa that I would reach out to the patient to get her scheduled since it appears she is overdue for an OV. She verbalized understanding. Once the appt has been made, Melissa would like to be called back once the appt has been made.   Called patient but she did not answer. I left a message for her to call back.

## 2019-11-15 NOTE — Telephone Encounter (Signed)
LMTCB x2 for pt 

## 2019-11-16 NOTE — Telephone Encounter (Signed)
Called and spoke with patient she is now scheduled for 5/4 at 11:30 with Derl Barrow. Nothing further needed at this time.

## 2019-11-23 ENCOUNTER — Other Ambulatory Visit: Payer: Self-pay

## 2019-11-23 ENCOUNTER — Ambulatory Visit (INDEPENDENT_AMBULATORY_CARE_PROVIDER_SITE_OTHER): Payer: Medicare Other | Admitting: Primary Care

## 2019-11-23 ENCOUNTER — Encounter: Payer: Self-pay | Admitting: Primary Care

## 2019-11-23 VITALS — BP 100/60 | HR 103 | Temp 98.3°F | Ht 65.0 in | Wt 272.8 lb

## 2019-11-23 DIAGNOSIS — Z6841 Body Mass Index (BMI) 40.0 and over, adult: Secondary | ICD-10-CM | POA: Diagnosis not present

## 2019-11-23 DIAGNOSIS — J984 Other disorders of lung: Secondary | ICD-10-CM

## 2019-11-23 DIAGNOSIS — J9611 Chronic respiratory failure with hypoxia: Secondary | ICD-10-CM | POA: Diagnosis not present

## 2019-11-23 DIAGNOSIS — G4733 Obstructive sleep apnea (adult) (pediatric): Secondary | ICD-10-CM | POA: Insufficient documentation

## 2019-11-23 DIAGNOSIS — J449 Chronic obstructive pulmonary disease, unspecified: Secondary | ICD-10-CM | POA: Diagnosis not present

## 2019-11-23 DIAGNOSIS — J439 Emphysema, unspecified: Secondary | ICD-10-CM | POA: Diagnosis not present

## 2019-11-23 DIAGNOSIS — Z87891 Personal history of nicotine dependence: Secondary | ICD-10-CM

## 2019-11-23 MED ORDER — ADVAIR HFA 115-21 MCG/ACT IN AERO: 2.0000 | INHALATION_SPRAY | Freq: Two times a day (BID) | RESPIRATORY_TRACT | 5 refills | Status: DC

## 2019-11-23 MED ORDER — ALBUTEROL SULFATE HFA 108 (90 BASE) MCG/ACT IN AERS: 2.0000 | INHALATION_SPRAY | Freq: Four times a day (QID) | RESPIRATORY_TRACT | 3 refills | Status: DC | PRN

## 2019-11-23 NOTE — Addendum Note (Signed)
Addended by: Vanessa Barbara on: 11/23/2019 02:14 PM   Modules accepted: Orders

## 2019-11-23 NOTE — Assessment & Plan Note (Signed)
-   Refer to healthy weight and wellness 

## 2019-11-23 NOTE — Addendum Note (Signed)
Addended by: Vanessa Barbara on: 11/23/2019 03:35 PM   Modules accepted: Orders

## 2019-11-23 NOTE — Assessment & Plan Note (Signed)
-   Intolerant to BIPAP - Sleeps with head of bed elevated  - Denies nocturnal symptoms. Rare snoring. - Encourage weight loss

## 2019-11-23 NOTE — Assessment & Plan Note (Signed)
-   Ambulatory O2 desaturated 83% RA after 2 laps, improved >90% with 3L  - Renew oxygen order at 3L continuous

## 2019-11-23 NOTE — Assessment & Plan Note (Signed)
-   Following with lung cancer screening program, LDCT in February 2021 showed lung RADS2 - Due for repeat LDCT in February 2022

## 2019-11-23 NOTE — Assessment & Plan Note (Signed)
-   Experiences dyspnea with exertion/exercises  - PFTs 2019 showed mixed restrictive/obstructive lung disease  - Not currently using Symbicort 80, intolerant to DPI. Re-assured patient that inhaler is not powder form. - Symbicort not formulary on plan. Change to Advair Hfa 115-58mcg two puffs twice daily - Continue Albuterol hfa/nebulizer every 6 hours as needed for shortness of breath/wheezing

## 2019-11-23 NOTE — Progress Notes (Signed)
@Patient  ID: Theresa Fernandez, female    DOB: Mar 31, 1956, 64 y.o.   MRN: [id number]  Chief Complaint  Patient presents with  . Follow-up    breathing ok, sometimes at home can go without oxygen,.    Referring provider: [facility]  HPI: 64 year old female, former smoker quit in 2019.  Past medical history significant for moderate COPD, obstructive sleep apnea, chronic hypoxic respiratory failure, diastolic heart failure, hypertension, diabetes type 2.  Patient of Dr. Valeta Harms, last seen on February 09/2018.  She is also followed by Dr. Solon Palm for sleep apnea and followed with our lung cancer screening program. LDCT February 16th 2021 showed Lung RADS2, needs repeat in 12 months.   11/23/2019 Patient presents today for regular follow-up and needs to re-qualify for oxygen. Not currently on BIPAP, states that she could not tolerate d.t claustrophobia. Wears 3L oxygen continuously. Sleeps in hospital bed at an incline. No nocturnal symptoms, reports that she rarely snores. Breathing has been fine. No issues. Not currently using Symbicort, states she could not tolerate. There may be some confusion with old inhalers she has used in the past that she did not like d/t them being dry powder. Uses Albuterol rescue inhaler as needed mostly when exercising. Trying to build up her strength and endurance. She is doing exercises at home. Ambulates with rolling walker. She is trying to work on weight loss.   PFTs: January 2019- FVC 61%, FEV1 64%, ratio 80, moderately reduced DLCO Mixed restrictive and obstructive lung disease  Echocardiogram: November 2019- Normal EF, grade 1 diastolic dysfunction  Imaging: 09/07/19 LDCT -  Several tiny lung nodules are identified. The largest is in the lingula measuring 3.4 mm. Emphysema with aortic atherosclerosis. 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 Ceftriaxone Hives and Rash      After second dose on 06/07/2018,  . Topamax [Topiramate]   . Voltaren [Diclofenac Sodium]     migraine    Immunization History  Administered Date(s) Administered  . Influenza,inj,Quad PF,6+ Mos 06/08/2018  . Moderna SARS-COVID-2 Vaccination 08/24/2019, 09/21/2019  . Pneumococcal Polysaccharide-23 06/08/2018    Past Medical History:  Diagnosis Date  . Allergy   . Anxiety   . Arthritis   . COPD (chronic obstructive pulmonary disease) ([place])   . Depression   . Diabetes mellitus without complication ([place])   . HTN (hypertension) 01/12/2013    Tobacco History: Social History   Tobacco Use  Smoking Status Former Smoker  . Packs/day: 3.00  . Years: 50.00  . Pack years: 150.00  . Types: Cigarettes  . Quit date: 05/30/2018  . Years since quitting: 1.4  Smokeless Tobacco Never Used   Counseling given: Not Answered   Outpatient Medications Prior to Visit  Medication Sig Dispense Refill  . albuterol (PROVENTIL) (2.5 MG/3ML) 0.083% nebulizer solution Take 2.5 mg by nebulization every 6 (six) hours as needed for wheezing or shortness of breath (wheezing).     Marland Kitchen amphetamine-dextroamphetamine (ADDERALL) 20 MG tablet Take 1 tablet (20 mg total) by mouth 3 (three) times daily. (Patient taking differently: Take 20 mg by mouth 3 (three) times daily. Take 1-2 tablets daily) 90 tablet 0  . busPIRone (BUSPAR) 15 MG tablet Take 15 mg by mouth 2 (two) times daily.     . furosemide (LASIX) 40 MG tablet Take 1 tablet (40 mg total) by mouth 2 (two) times daily. 90 tablet 3  . HYDROcodone-acetaminophen ([place])  10-325 MG tablet Take 1 tablet by mouth every 5 (five) hours as needed.    . hydrOXYzine (ATARAX/VISTARIL) 25 MG tablet Take 25 mg by mouth 3 (three) times daily.    Marland Kitchen lamoTRIgine (LAMICTAL) 25 MG tablet Take 25 mg by mouth daily.    Marland Kitchen lisinopril (PRINIVIL,ZESTRIL) 2.5 MG tablet Take 2.5 mg by mouth daily.    . magnesium gluconate (MAGONATE) 500 MG tablet Take 1 tablet (500 mg total) by  mouth 2 (two) times daily. 60 tablet 3  . omeprazole (PRILOSEC) 40 MG capsule Take 40 mg by mouth daily.    . pioglitazone (ACTOS) 30 MG tablet Take 1 tablet (30 mg total) by mouth daily. 30 tablet 3  . potassium gluconate 595 (99 K) MG TABS tablet Take 595 mg by mouth daily.    . pravastatin (PRAVACHOL) 20 MG tablet Take 1 tablet (20 mg total) by mouth daily at 6 PM. 30 tablet 0  . pregabalin (LYRICA) 150 MG capsule Take 1 capsule (150 mg total) by mouth at bedtime. (Patient taking differently: Take 100 mg by mouth 2 (two) times daily. ) 30 capsule 3  . sertraline (ZOLOFT) 100 MG tablet Take 100 mg by mouth 2 (two) times daily.     Marland Kitchen albuterol (PROVENTIL HFA;VENTOLIN HFA) 108 (90 Base) MCG/ACT inhaler Inhale 2 puffs into the lungs every 6 (six) hours as needed for wheezing or shortness of breath (wheezing). 1 Inhaler 5  . metolazone (ZAROXOLYN) 2.5 MG tablet Take 1 tablet (2.5 mg total) by mouth every other day. As needed for weight gain of 2 lbs 45 tablet 3  . budesonide-formoterol (SYMBICORT) 80-4.5 MCG/ACT inhaler Inhale 2 puffs into the lungs 2 (two) times daily. (Patient not taking: Reported on 11/23/2019) 1 Inhaler 2   No facility-administered medications prior to visit.   Review of Systems  Review of Systems  Constitutional: Negative.   Respiratory: Negative for cough and shortness of breath.    Physical Exam  BP 100/60 (BP Location: Left Arm, Cuff Size: Large)   Pulse (!) 103   Temp 98.3 F (36.8 C) (Temporal)   Ht 5\' 5"  (1.651 m)   Wt 272 lb 12.8 oz (123.7 kg)   SpO2 92%   BMI 45.40 kg/m  Physical Exam Constitutional:      Appearance: Normal appearance.  HENT:     Head: Normocephalic and atraumatic.  Neurological:     General: No focal deficit present.     Mental Status: She is alert and oriented to person, place, and time. Mental status is at baseline.  Psychiatric:        Mood and Affect: Mood normal.        Behavior: Behavior normal.        Thought Content: Thought  content normal.        Judgment: Judgment normal.      Lab Results:  CBC    Component Value Date/Time   WBC 10.0 06/25/2018 1227   RBC 3.95 06/25/2018 1227   HGB 11.6 (L) 06/25/2018 1227   HCT 39.4 06/25/2018 1227   PLT 247 06/25/2018 1227   MCV 99.7 06/25/2018 1227   MCH 29.4 06/25/2018 1227   MCHC 29.4 (L) 06/25/2018 1227   RDW 13.5 06/25/2018 1227   LYMPHSABS 1.8 06/06/2018 1458   MONOABS 0.6 06/06/2018 1458   EOSABS 0.3 06/06/2018 1458   BASOSABS 0.1 06/06/2018 1458    BMET    Component Value Date/Time   NA 145 (H) 10/21/2018 1431  K 5.1 10/21/2018 1431   CL 101 10/21/2018 1431   CO2 27 10/21/2018 1431   GLUCOSE 120 (H) 10/21/2018 1431   GLUCOSE 93 07/20/2018 1658   BUN 34 (H) 10/21/2018 1431   CREATININE 1.62 (H) 10/21/2018 1431   CREATININE 0.89 02/11/2013 0922   CALCIUM 9.5 10/21/2018 1431   GFRNONAA 33 (L) 10/21/2018 1431   GFRNONAA 72 02/11/2013 0922   GFRAA 38 (L) 10/21/2018 1431   GFRAA 83 02/11/2013 0922    BNP    Component Value Date/Time   BNP 14.4 10/21/2018 1430   BNP 63.8 06/25/2018 1227    ProBNP    Component Value Date/Time   PROBNP 38.0 07/20/2018 1658    Imaging: No results found.   Assessment & Plan:   Mixed restrictive and obstructive lung disease ([place]) - Experiences dyspnea with exertion/exercises  - PFTs 2019 showed mixed restrictive/obstructive lung disease  - Not currently using Symbicort 80, intolerant to DPI. Re-assured patient that inhaler is not powder form. - Symbicort not formulary on plan. Change to Advair Hfa 115-76mcg two puffs twice daily - Continue Albuterol hfa/nebulizer every 6 hours as needed for shortness of breath/wheezing   Chronic respiratory failure with hypoxia ([facility]) - Ambulatory O2 desaturated 83% RA after 2 laps, improved >90% with 3L  - Renew oxygen order at 3L continuous   Body mass index 45.0-49.9, adult ([facility]) - Refer to healthy weight and wellness  OSA (obstructive sleep apnea) -  Intolerant to BIPAP - Sleeps with head of bed elevated  - Denies nocturnal symptoms. Rare snoring. - Encourage weight loss    Former smoker - Following with lung cancer screening program, LDCT in February 2021 showed lung RADS2 - Due for repeat LDCT in February 2022      Martyn Ehrich, NP 11/23/2019

## 2019-11-23 NOTE — Patient Instructions (Addendum)
Orders: - Renew OXYGEN order at 3L continuous   Referral: - Medical weight management (healthy weight and wellness re: BMI 45)  COPD mixed: - Changing Symbicort to Advair HFA- take two puffs twice day scheduled (not a dry powder) - Use albuterol 2 puffs every 6 hours as needed for breakthrough shortness of breath   Follow-up: - 6 months with Dr. Valeta Harms

## 2019-12-01 NOTE — Progress Notes (Signed)
PCCM: thanks for seeing her Garner Nash, DO [facility] 12/01/2019 5:50 PM

## 2020-01-01 ENCOUNTER — Other Ambulatory Visit: Payer: Self-pay

## 2020-01-01 ENCOUNTER — Emergency Department (HOSPITAL_COMMUNITY)
Admission: EM | Admit: 2020-01-01 | Discharge: 2020-01-01 | Disposition: A | Discharge status: Home / Self Care | Payer: Medicare Other | Attending: Emergency Medicine | Admitting: Emergency Medicine

## 2020-01-01 DIAGNOSIS — Z87891 Personal history of nicotine dependence: Secondary | ICD-10-CM | POA: Insufficient documentation

## 2020-01-01 DIAGNOSIS — I5032 Chronic diastolic (congestive) heart failure: Secondary | ICD-10-CM | POA: Diagnosis not present

## 2020-01-01 DIAGNOSIS — R197 Diarrhea, unspecified: Secondary | ICD-10-CM | POA: Insufficient documentation

## 2020-01-01 DIAGNOSIS — I130 Hypertensive heart and chronic kidney disease with heart failure and stage 1 through stage 4 chronic kidney disease, or unspecified chronic kidney disease: Secondary | ICD-10-CM | POA: Diagnosis not present

## 2020-01-01 DIAGNOSIS — N183 Chronic kidney disease, stage 3 unspecified: Secondary | ICD-10-CM | POA: Diagnosis not present

## 2020-01-01 DIAGNOSIS — Z79899 Other long term (current) drug therapy: Secondary | ICD-10-CM | POA: Insufficient documentation

## 2020-01-01 DIAGNOSIS — E1122 Type 2 diabetes mellitus with diabetic chronic kidney disease: Secondary | ICD-10-CM | POA: Insufficient documentation

## 2020-01-01 DIAGNOSIS — R112 Nausea with vomiting, unspecified: Secondary | ICD-10-CM | POA: Insufficient documentation

## 2020-01-01 DIAGNOSIS — Z7984 Long term (current) use of oral hypoglycemic drugs: Secondary | ICD-10-CM | POA: Diagnosis not present

## 2020-01-01 LAB — URINALYSIS, ROUTINE W REFLEX MICROSCOPIC
Bilirubin Urine: NEGATIVE
Glucose, UA: NEGATIVE mg/dL
Hgb urine dipstick: NEGATIVE
Ketones, ur: 5 mg/dL — AB
Nitrite: NEGATIVE
Protein, ur: 30 mg/dL — AB
Specific Gravity, Urine: 1.014 (ref 1.005–1.030)
pH: 5 (ref 5.0–8.0)

## 2020-01-01 LAB — CBC
HCT: 39.3 % (ref 36.0–46.0)
Hemoglobin: 12.1 g/dL (ref 12.0–15.0)
MCH: 29.3 pg (ref 26.0–34.0)
MCHC: 30.8 g/dL (ref 30.0–36.0)
MCV: 95.2 fL (ref 80.0–100.0)
Platelets: 301 10*3/uL (ref 150–400)
RBC: 4.13 MIL/uL (ref 3.87–5.11)
RDW: 13.2 % (ref 11.5–15.5)
WBC: 12.3 10*3/uL — ABNORMAL HIGH (ref 4.0–10.5)
nRBC: 0 % (ref 0.0–0.2)

## 2020-01-01 LAB — COMPREHENSIVE METABOLIC PANEL
ALT: 16 U/L (ref 0–44)
AST: 19 U/L (ref 15–41)
Albumin: 4.1 g/dL (ref 3.5–5.0)
Alkaline Phosphatase: 91 U/L (ref 38–126)
Anion gap: 13 (ref 5–15)
BUN: 15 mg/dL (ref 8–23)
CO2: 25 mmol/L (ref 22–32)
Calcium: 9.1 mg/dL (ref 8.9–10.3)
Chloride: 103 mmol/L (ref 98–111)
Creatinine, Ser: 1.31 mg/dL — ABNORMAL HIGH (ref 0.44–1.00)
GFR calc Af Amer: 49 mL/min — ABNORMAL LOW (ref >60)
GFR calc non Af Amer: 43 mL/min — ABNORMAL LOW (ref >60)
Glucose, Bld: 115 mg/dL — ABNORMAL HIGH (ref 70–99)
Potassium: 3.3 mmol/L — ABNORMAL LOW (ref 3.5–5.1)
Sodium: 141 mmol/L (ref 135–145)
Total Bilirubin: 0.7 mg/dL (ref 0.3–1.2)
Total Protein: 7.5 g/dL (ref 6.5–8.1)

## 2020-01-01 LAB — LIPASE, BLOOD: Lipase: 21 U/L (ref 11–51)

## 2020-01-01 MED ORDER — ONDANSETRON HCL 4 MG/2ML IJ SOLN
4.0000 mg | Freq: Once | INTRAMUSCULAR | Status: AC
  Administered 2020-01-01: 4 mg via INTRAVENOUS
  Filled 2020-01-01: qty 2

## 2020-01-01 MED ORDER — ONDANSETRON HCL 8 MG PO TABS
8.0000 mg | ORAL_TABLET | Freq: Three times a day (TID) | ORAL | 0 refills | Status: DC | PRN
Start: 2020-01-01 — End: 2021-08-06

## 2020-01-01 MED ORDER — SODIUM CHLORIDE 0.9 % IV BOLUS
1000.0000 mL | Freq: Once | INTRAVENOUS | Status: AC
  Administered 2020-01-01: 1000 mL via INTRAVENOUS

## 2020-01-01 MED ORDER — LOPERAMIDE HCL 2 MG PO CAPS
4.0000 mg | ORAL_CAPSULE | ORAL | Status: DC | PRN
  Filled 2020-01-01: qty 2

## 2020-01-01 NOTE — ED Provider Notes (Signed)
[facility] EMERGENCY DEPARTMENT Provider Note   CSN: [id number] Arrival date & time: 01/01/20  1020     History Chief Complaint  Patient presents with   Nausea   Emesis    Theresa Fernandez is a 64 y.o. female.  HPI She presents for evaluation of nausea with "dry heaves, and frequent diarrhea," for 3 days.  She denies abdominal pain, fever, chills, no shortness of breath or cough.  She feels like she has "lost my taste."  She had her Covid vaccines, completed the series, 2 months ago.  Her mother is ill with a similar problem.  She is currently seeking care as well in the emergency department.  There are no other known modifying factors.    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/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   Diastolic heart failure ([place]) 08/25/2018   Interstitial pulmonary disease ([place]) 07/26/2018   Tobacco abuse 06/22/2018   Former smoker 06/22/2018   Acute on chronic heart failure with preserved ejection fraction (HFpEF) ([place]) 06/09/2018   CKD (chronic kidney disease) stage 3, GFR 30-59 ml/min 06/09/2018   Other emphysema ([place]) 06/06/2018   Cellulitis of abdominal wall 06/06/2018   Spinal compression fracture (HCC)    Knee pain 03/19/2013   DM2 (diabetes mellitus, type 2) ([place]) 02/11/2013   Muscle cramps 02/11/2013   Dyslipidemia 02/11/2013   Healthcare maintenance 02/11/2013   HTN (hypertension) 01/12/2013    Depression 01/12/2013    Past Surgical History:  Procedure Laterality Date   TUBAL LIGATION       OB History   No obstetric history on file.     Family History  Problem Relation Age of Onset   Diabetes Mother    Alcohol abuse Father    Diabetes Father     Social History   Tobacco Use   Smoking status: Former Smoker    Packs/day: 3.00    Years: 50.00    Pack years: 150.00    Types: Cigarettes    Quit date: 05/30/2018    Years since quitting: 1.5   Smokeless tobacco: Never Used  Substance Use Topics   Alcohol use: No   Drug use: No    Home Medications Prior to Admission medications   Medication Sig Start Date End Date Taking? Authorizing Provider  albuterol (ACCUNEB) 0.63 MG/3ML nebulizer solution Take 0.63 mg by nebulization every 4 (four) hours as needed for wheezing or shortness of breath.  09/24/19  Yes [provider]  albuterol (VENTOLIN HFA) 108 (90 Base) MCG/ACT inhaler Inhale 2 puffs into the lungs every 6 (six) hours as needed for wheezing or shortness of breath (wheezing). 11/23/19  Yes Martyn Ehrich, NP  ALPRAZolam Duanne Moron) 0.5 MG tablet Take 0.25 mg by mouth at bedtime. 11/28/19  Yes [provider]  amphetamine-dextroamphetamine (ADDERALL) 20 MG tablet Take 1 tablet (20 mg total) by mouth 3 (three) times daily. Patient taking differently: Take 20 mg by mouth  See admin instructions. Take one tablet (20 mg) by mouth every morning, may also take one tablet (20 mg) after lunch if needed for exhaustion 01/12/13  Yes Theodis Blaze, MD  Biotin 5000 MCG TABS Take 5,000 mcg by mouth daily.   Yes [provider]  busPIRone (BUSPAR) 15 MG tablet Take 30 mg by mouth at bedtime.    Yes [provider]  carisoprodol (SOMA) 250 MG tablet Take 250 mg by mouth See admin instructions. Take one tablet (250 mg) by mouth twice daily - 7pm and midnight 11/27/19  Yes [provider]  furosemide (LASIX) 40 MG tablet Take 1 tablet  (40 mg total) by mouth 2 (two) times daily. Patient taking differently: Take 40 mg by mouth daily as needed for fluid or edema.  10/19/18  Yes Hilty, Nadean Corwin, MD  HYDROcodone-acetaminophen (NORCO) 10-325 MG tablet Take 1 tablet by mouth every 6 (six) hours as needed (pain).    Yes [provider]  hydrOXYzine (ATARAX/VISTARIL) 25 MG tablet Take 25 mg by mouth at bedtime.  04/21/18  Yes [provider]  ibuprofen (ADVIL) 200 MG tablet Take 400 mg by mouth every 6 (six) hours as needed for headache (pain).   Yes [provider]  lamoTRIgine (LAMICTAL) 25 MG tablet Take 25 mg by mouth at bedtime.  04/21/18  Yes [provider]  lisinopril (PRINIVIL,ZESTRIL) 2.5 MG tablet Take 2.5 mg by mouth daily. For kidney function   Yes [provider]  Magnesium 500 MG TABS Take 500-1,000 mg by mouth See admin instructions. Take 1 tablet (500 mg) by mouth every morning and 2 tablets (1000 mg) at night   Yes [provider]  omeprazole (PRILOSEC) 40 MG capsule Take 40 mg by mouth daily.   Yes [provider]  pioglitazone (ACTOS) 15 MG tablet Take 15 mg by mouth daily. 09/13/19  Yes [provider]  Potassium 99 MG TABS Take 99 mg by mouth at bedtime.   Yes [provider]  prazosin (MINIPRESS) 5 MG capsule Take 5 mg by mouth at bedtime. 12/04/19  Yes [provider]  pregabalin (LYRICA) 150 MG capsule Take 1 capsule (150 mg total) by mouth at bedtime. Patient taking differently: Take 100 mg by mouth 2 (two) times daily.  02/11/13  Yes Johnson, Clanford L, MD  sertraline (ZOLOFT) 100 MG tablet Take 200 mg by mouth at bedtime.    Yes [provider]  Vitamin D, Ergocalciferol, (DRISDOL) 1.25 MG (50000 UNIT) CAPS capsule Take 50,000 Units by mouth every Tuesday.   Yes [provider]  fluticasone-salmeterol (ADVAIR HFA) 115-21 MCG/ACT inhaler Inhale 2 puffs into the lungs 2 (two) times daily. Patient not taking:  Reported on 01/01/2020 11/23/19   Martyn Ehrich, NP  metolazone (ZAROXOLYN) 2.5 MG tablet Take 1 tablet (2.5 mg total) by mouth every other day. As needed for weight gain of 2 lbs Patient not taking: Reported on 01/01/2020 10/23/18 01/21/19  [name], MD  ondansetron (ZOFRAN) 8 MG tablet Take 1 tablet (8 mg total) by mouth every 8 (eight) hours as needed for nausea or vomiting. 01/01/20   Daleen Bo, MD  budesonide-formoterol [facility]) 80-4.5 MCG/ACT inhaler Inhale 2 puffs into the lungs 2 (two) times daily. Patient not taking: Reported on 11/23/2019 07/20/18 11/23/19  Martyn Ehrich, NP    Allergies    Ceftriaxone, Topamax [topiramate], and Voltaren [diclofenac sodium]  Review of Systems   Review of Systems  All other systems reviewed  and are negative.   Physical Exam Updated Vital Signs BP (!) 153/79    Pulse 68    Temp 98 F (36.7 C) (Oral)    Resp 20    SpO2 100%   Physical Exam Vitals and nursing note reviewed.  Constitutional:      General: She is not in acute distress.    Appearance: She is well-developed. She is obese. She is not ill-appearing, toxic-appearing or diaphoretic.  HENT:     Head: Normocephalic and atraumatic.     Mouth/Throat:     Mouth: Mucous membranes are moist.     Pharynx: No oropharyngeal exudate or posterior oropharyngeal erythema.  Eyes:     Conjunctiva/sclera: Conjunctivae normal.     Pupils: Pupils are equal, round, and reactive to light.  Neck:     Trachea: Phonation normal.  Cardiovascular:     Rate and Rhythm: Normal rate and regular rhythm.  Pulmonary:     Effort: Pulmonary effort is normal. No respiratory distress.     Breath sounds: Normal breath sounds. No stridor.  Chest:     Chest wall: No tenderness.  Abdominal:     General: There is no distension.     Palpations: Abdomen is soft.     Tenderness: There is no abdominal tenderness. There is no guarding.  Musculoskeletal:        General: Normal range of motion.      Cervical back: Normal range of motion and neck supple.  Skin:    General: Skin is warm and dry.  Neurological:     Mental Status: She is alert and oriented to person, place, and time.     Motor: No abnormal muscle tone.  Psychiatric:        Behavior: Behavior normal.        Thought Content: Thought content normal.        Judgment: Judgment normal.     ED Results / Procedures / Treatments   Labs (all labs ordered are listed, but only abnormal results are displayed) Labs Reviewed  COMPREHENSIVE METABOLIC PANEL - Abnormal; Notable for the following components:      Result Value   Potassium 3.3 (*)    Glucose, Bld 115 (*)    Creatinine, Ser 1.31 (*)    GFR calc non Af Amer 43 (*)    GFR calc Af Amer 49 (*)    All other components within normal limits  CBC - Abnormal; Notable for the following components:   WBC 12.3 (*)    All other components within normal limits  URINALYSIS, ROUTINE W REFLEX MICROSCOPIC - Abnormal; Notable for the following components:   APPearance HAZY (*)    Ketones, ur 5 (*)    Protein, ur 30 (*)    Leukocytes,Ua TRACE (*)    Bacteria, UA RARE (*)    All other components within normal limits  LIPASE, BLOOD    EKG None  Radiology No results found.  Procedures Procedures (including critical care time)  Medications Ordered in ED Medications  loperamide (IMODIUM) capsule 4 mg (has no administration in time range)  sodium chloride 0.9 % bolus 1,000 mL (0 mLs Intravenous Stopped 01/01/20 1518)  ondansetron (ZOFRAN) injection 4 mg (4 mg Intravenous Given 01/01/20 1246)    ED Course  I have reviewed the triage vital signs and the nursing notes.  Pertinent labs & imaging results that were available during my care of the patient were reviewed by me and considered in my medical decision  making (see chart for details).  Clinical Course as of Jan 01 1712  Sat Jan 01, 2020  1555 Normal except presence of ketones, protein, trace leukocytes and bacteria.    Urinalysis, Routine w reflex microscopic(!) [EW]  1557 Normal except potassium low, glucose high, creatinine high, GFR low  Comprehensive metabolic panel(!) [EW]  1829 Normal  Lipase, blood [EW]  1557 Normal except white count high  CBC(!) [EW]    Clinical Course User Index [EW] Daleen Bo, MD   MDM Rules/Calculators/A&P                           Patient Vitals for the past 24 hrs:  BP Temp Temp src Pulse Resp SpO2  01/01/20 1702 -- -- -- 68 -- 100 %  01/01/20 1701 -- -- -- 69 -- 100 %  01/01/20 1700 -- -- -- 71 -- 100 %  01/01/20 1659 -- -- -- 72 -- 100 %  01/01/20 1658 -- -- -- 69 -- 100 %  01/01/20 1657 -- -- -- 69 -- 100 %  01/01/20 1656 -- -- -- 74 -- 100 %  01/01/20 1655 -- -- -- 67 -- 100 %  01/01/20 1654 -- -- -- 67 -- 100 %  01/01/20 1653 -- -- -- 72 -- 100 %  01/01/20 1652 -- -- -- 69 -- 100 %  01/01/20 1651 -- -- -- 69 -- 100 %  01/01/20 1650 -- -- -- 69 -- 100 %  01/01/20 1649 -- -- -- 70 -- 98 %  01/01/20 1648 -- -- -- 72 -- 100 %  01/01/20 1647 -- -- -- 68 -- 100 %  01/01/20 1646 -- -- -- 66 -- 100 %  01/01/20 1645 -- -- -- 66 -- 100 %  01/01/20 1644 -- -- -- 68 -- 100 %  01/01/20 1643 -- -- -- 70 -- 100 %  01/01/20 1642 -- -- -- 71 -- 100 %  01/01/20 1636 -- -- -- 68 -- 100 %  01/01/20 1635 -- -- -- 67 -- 100 %  01/01/20 1634 -- -- -- 73 -- 100 %  01/01/20 1633 -- -- -- 69 -- 100 %  01/01/20 1632 -- -- -- 65 -- 100 %  01/01/20 1631 -- -- -- 82 -- 100 %  01/01/20 1630 (!) 153/79 -- -- 67 -- 95 %  01/01/20 1629 -- -- -- 69 -- 100 %  01/01/20 1628 -- -- -- 68 -- 100 %  01/01/20 1627 -- -- -- 70 -- 100 %  01/01/20 1626 -- -- -- 74 -- 100 %  01/01/20 1625 -- -- -- 73 -- 100 %  01/01/20 1624 -- -- -- 65 -- 100 %  01/01/20 1623 -- -- -- 65 -- 100 %  01/01/20 1622 -- -- -- 71 -- 100 %  01/01/20 1621 -- -- -- 71 -- 100 %  01/01/20 1620 -- -- -- 74 -- 100 %  01/01/20 1619 -- -- -- 82 -- 100 %  01/01/20 1618 -- -- -- 81 -- (!) 86 %  01/01/20 1617 --  -- -- 68 -- 100 %  01/01/20 1616 -- -- -- 69 -- 98 %  01/01/20 1615 -- -- -- 76 -- (!) 79 %  01/01/20 1614 -- -- -- (!) 103 -- 100 %  01/01/20 1613 -- -- -- 79 -- 100 %  01/01/20 1612 -- -- -- 75 -- 100 %  01/01/20 1611 -- -- --  78 -- 100 %  °01/01/20 1610 -- -- -- 72 -- 100 %  °01/01/20 1609 -- -- -- 73 -- 96 %  °01/01/20 1557 -- -- -- 64 -- 100 %  °01/01/20 1553 -- -- -- 78 -- 100 %  °01/01/20 1552 -- -- -- 65 -- 100 %  °01/01/20 1551 -- -- -- 64 -- 100 %  °01/01/20 1550 -- -- -- 73 -- 100 %  °01/01/20 1549 -- -- -- 70 -- 100 %  °01/01/20 1545 135/69 -- -- -- -- --  °01/01/20 1544 -- -- -- 79 -- 100 %  °01/01/20 1543 -- -- -- 74 -- 100 %  °01/01/20 1542 (!) 121/100 -- -- 69 -- 100 %  °01/01/20 1541 -- -- -- 70 -- 100 %  °01/01/20 1540 -- -- -- 65 -- 100 %  °01/01/20 1539 -- -- -- 64 -- 100 %  °01/01/20 1538 -- -- -- 68 -- 100 %  °01/01/20 1537 -- -- -- 70 -- 100 %  °01/01/20 1536 -- -- -- 69 -- 100 %  °01/01/20 1535 -- -- -- 81 -- 100 %  °01/01/20 1534 -- -- -- (!) 103 -- 100 %  °01/01/20 1533 -- -- -- 71 -- 99 %  °01/01/20 1532 -- -- -- 77 -- 100 %  °01/01/20 1531 -- -- -- 67 -- 99 %  °01/01/20 1530 -- -- -- 73 -- 100 %  °01/01/20 1529 -- -- -- 73 -- 98 %  °01/01/20 1528 -- -- -- 76 -- 100 %  °01/01/20 1527 -- -- -- 69 -- 100 %  °01/01/20 1526 -- -- -- 70 -- 100 %  °01/01/20 1525 -- -- -- 72 -- 98 %  °01/01/20 1524 -- -- -- 74 -- 100 %  °01/01/20 1523 -- -- -- 73 -- 100 %  °01/01/20 1522 -- -- -- 71 -- 98 %  °01/01/20 1521 -- -- -- 72 -- 100 %  °01/01/20 1520 -- -- -- 75 -- 100 %  °01/01/20 1515 -- -- -- 93 -- 100 %  °01/01/20 1514 -- -- -- 78 -- 100 %  °01/01/20 1513 -- -- -- 72 -- 99 %  °01/01/20 1512 -- -- -- 69 -- 98 %  °01/01/20 1511 -- -- -- 77 -- 99 %  °01/01/20 1510 -- -- -- 74 -- 99 %  °01/01/20 1509 -- -- -- 69 -- 99 %  °01/01/20 1508 -- -- -- 68 -- 99 %  °01/01/20 1507 -- -- -- 72 -- 99 %  °01/01/20 1506 -- -- -- 79 -- 97 %  °01/01/20 1505 -- -- -- 81 -- 100 %  °01/01/20 1504 -- -- -- 79 -- 100 %   °01/01/20 1503 -- -- -- 80 -- 100 %  °01/01/20 1502 -- -- -- 80 -- 100 %  °01/01/20 1501 -- -- -- 80 -- 99 %  °01/01/20 1500 -- -- -- 79 -- 100 %  °01/01/20 1459 -- -- -- 77 -- 100 %  °01/01/20 1458 -- -- -- 94 -- 100 %  °01/01/20 1457 -- -- -- 86 -- 100 %  °01/01/20 1456 -- -- -- 89 -- 100 %  °01/01/20 1455 -- -- -- 84 -- 100 %  °01/01/20 1454 -- -- -- 72 -- 100 %  °01/01/20 1453 -- -- -- 68 -- 100 %  °01/01/20 1452 -- -- -- 74 -- 100 %  °01/01/20 1451 -- -- -- 73 -- 99 %  °01/01/20 1450 -- -- --   69 -- 100 %  °01/01/20 1449 -- -- -- 69 -- 99 %  °01/01/20 1448 -- -- -- 75 -- 100 %  °01/01/20 1447 -- -- -- 67 -- 100 %  °01/01/20 1446 -- -- -- 77 -- 99 %  °01/01/20 1445 -- -- -- 80 -- 98 %  °01/01/20 1444 -- -- -- 79 -- 98 %  °01/01/20 1443 -- -- -- 75 -- 100 %  °01/01/20 1442 -- -- -- 73 -- 100 %  °01/01/20 1441 -- -- -- 70 -- 100 %  °01/01/20 1440 -- -- -- 80 -- 100 %  °01/01/20 1439 -- -- -- 73 -- 100 %  °01/01/20 1438 -- -- -- 66 -- 100 %  °01/01/20 1437 -- -- -- 76 -- 100 %  °01/01/20 1436 -- -- -- 73 -- 100 %  °01/01/20 1435 -- -- -- 72 -- 100 %  °01/01/20 1434 -- -- -- 69 -- 100 %  °01/01/20 1433 -- -- -- 77 -- 100 %  °01/01/20 1432 -- -- -- 73 -- 100 %  °01/01/20 1431 -- -- -- 66 -- 100 %  °01/01/20 1430 -- -- -- 60 -- 100 %  °01/01/20 1429 -- -- -- 71 -- 100 %  °01/01/20 1428 -- -- -- 68 -- 100 %  °01/01/20 1427 -- -- -- 63 -- 100 %  °01/01/20 1426 -- -- -- 78 -- 100 %  °01/01/20 1425 -- -- -- 75 -- 100 %  °01/01/20 1424 -- -- -- 72 -- 100 %  °01/01/20 1423 -- -- -- 70 -- 100 %  °01/01/20 1422 -- -- -- 71 -- 100 %  °01/01/20 1421 -- -- -- 73 -- 100 %  °01/01/20 1420 -- -- -- 67 -- 100 %  °01/01/20 1419 -- -- -- 67 -- 100 %  °01/01/20 1418 -- -- -- 72 -- 100 %  °01/01/20 1417 -- -- -- 72 -- 100 %  °01/01/20 1416 -- -- -- 69 -- 100 %  °01/01/20 1415 -- -- -- 68 -- 100 %  °01/01/20 1414 -- -- -- 66 -- 100 %  °01/01/20 1413 -- -- -- 65 -- 100 %  °01/01/20 1412 -- -- -- 69 -- 100 %  °01/01/20 1411 -- -- -- 69 --  100 %  °01/01/20 1410 -- -- -- 65 -- 100 %  °01/01/20 1409 -- -- -- 72 -- 100 %  °01/01/20 1408 -- -- -- 73 -- 100 %  °01/01/20 1407 -- -- -- 71 -- 99 %  °01/01/20 1406 -- -- -- 71 -- 100 %  °01/01/20 1405 -- -- -- 67 -- 100 %  °01/01/20 1404 -- -- -- 71 -- 100 %  °01/01/20 1403 -- -- -- 72 -- 97 %  °01/01/20 1402 -- -- -- 65 -- 100 %  °01/01/20 1401 -- -- -- 67 -- 99 %  °01/01/20 1400 -- -- -- 65 -- 100 %  °01/01/20 1359 -- -- -- 65 -- 100 %  °01/01/20 1358 -- -- -- 68 -- 100 %  °01/01/20 1357 -- -- -- 66 -- 100 %  °01/01/20 1356 -- -- -- 74 -- 99 %  °01/01/20 1355 -- -- -- 70 -- 98 %  °01/01/20 1354 -- -- -- 69 -- 100 %  °01/01/20 1353 -- -- -- 67 -- 99 %  °01/01/20 1352 -- -- -- 69 -- 100 %  °01/01/20 1351 -- -- -- 68 -- 100 %  °01/01/20 1350 -- -- --   65 -- 100 %  °01/01/20 1349 -- -- -- 66 -- 100 %  °01/01/20 1348 -- -- -- 70 -- 100 %  °01/01/20 1347 -- -- -- 72 -- 97 %  °01/01/20 1346 -- -- -- 74 -- 95 %  °01/01/20 1345 -- -- -- 85 -- 100 %  °01/01/20 1344 -- -- -- (!) 103 -- 100 %  °01/01/20 1343 -- -- -- 80 -- 100 %  °01/01/20 1342 -- -- -- 68 -- 100 %  °01/01/20 1341 -- -- -- 67 -- 99 %  °01/01/20 1340 -- -- -- 71 -- 100 %  °01/01/20 1339 -- -- -- 82 -- 98 %  °01/01/20 1338 -- -- -- 70 -- 99 %  °01/01/20 1337 -- -- -- 78 -- 99 %  °01/01/20 1336 -- -- -- 68 -- 100 %  °01/01/20 1335 -- -- -- 85 -- 100 %  °01/01/20 1334 -- -- -- 81 -- 100 %  °01/01/20 1333 -- -- -- 91 -- 99 %  °01/01/20 1332 -- -- -- 65 -- 98 %  °01/01/20 1331 -- -- -- 63 -- 99 %  °01/01/20 1330 -- -- -- 63 -- 99 %  °01/01/20 1329 -- -- -- 67 -- 100 %  °01/01/20 1328 -- -- -- 65 -- 100 %  °01/01/20 1327 -- -- -- 65 -- 100 %  °01/01/20 1326 -- -- -- 65 -- 100 %  °01/01/20 1325 -- -- -- 66 -- 100 %  °01/01/20 1324 -- -- -- 66 -- 100 %  °01/01/20 1323 -- -- -- 68 -- 100 %  °01/01/20 1322 -- -- -- 65 -- 100 %  °01/01/20 1321 -- -- -- 67 -- 100 %  °01/01/20 1320 -- -- -- 71 -- 100 %  °01/01/20 1319 -- -- -- 87 -- 100 %  °01/01/20 1318 -- -- -- 66 --  100 %  °01/01/20 1317 -- -- -- 70 -- 100 %  °01/01/20 1316 -- -- -- 76 -- 100 %  °01/01/20 1315 -- -- -- 78 -- 100 %  °01/01/20 1314 -- -- -- 72 -- 98 %  °01/01/20 1313 -- -- -- 70 -- 100 %  °01/01/20 1312 -- -- -- 64 -- 100 %  °01/01/20 1311 -- -- -- 65 -- 100 %  °01/01/20 1310 -- -- -- 63 -- 97 %  °01/01/20 1309 -- -- -- 63 -- 100 %  °01/01/20 1308 -- -- -- 63 -- 100 %  °01/01/20 1307 -- -- -- 63 -- 100 %  °01/01/20 1306 -- -- -- 64 -- 100 %  °01/01/20 1305 -- -- -- (!) 50 -- 95 %  °01/01/20 1304 -- -- -- 65 -- 100 %  °01/01/20 1303 -- -- -- 65 -- 100 %  °01/01/20 1302 -- -- -- 68 -- 99 %  °01/01/20 1301 -- -- -- 71 -- (!) 89 %  °01/01/20 1300 -- -- -- 64 -- 100 %  °01/01/20 1259 -- -- -- 67 -- 99 %  °01/01/20 1258 -- -- -- 64 -- 98 %  °01/01/20 1257 -- -- -- 66 -- 100 %  °01/01/20 1256 -- -- -- 66 -- 100 %  °01/01/20 1255 -- -- -- 72 -- 100 %  °01/01/20 1254 -- -- -- 77 -- 100 %  °01/01/20 1253 -- -- -- 69 -- 100 %  °01/01/20 1252 -- -- -- 69 -- 100 %  °01/01/20 1251 -- -- -- 70 -- 100 %  °01/01/20   1250 -- -- -- 66 -- 100 %  01/01/20 1249 -- -- -- 65 -- 100 %  01/01/20 1026 137/71 98 F (36.7 C) Oral 69 20 99 %    5:06 PM Reevaluation with update and discussion. After initial assessment and treatment, an updated evaluation reveals she states that she feels more comfortable at this time.  Findings discussed with the patient and all questions were answered. Daleen Bo   Medical Decision Making:  This patient is presenting for evaluation of nausea, vomiting and diarrhea, which does require a range of treatment options, and is a complaint that involves a moderate risk of morbidity and mortality. The differential diagnoses include gastroenteritis, acute intra-abdominal abnormality, viral or bacterial infection. I decided to review old records, and in summary healthy elderly female, with symptoms similar to her mother's who is currently ill with the same type of illness.  I did not require additional  historical information from anyone.  Clinical Laboratory Tests Ordered, included CBC, Metabolic panel, Urinalysis and Lipase. Review indicates reassuring with stable renal insufficiency.  No other acute metabolic abnormalities, requiring intervention.    Critical Interventions-clinical evaluation, laboratory testing, IV fluid treatment, antiemetic treatment, observation and reassessment  After These Interventions, the Patient was reevaluated and was found more comfortable and ready for discharge.  Doubt serious bacterial infection, metabolic instability or impending vascular collapse.  Incidental mildly low potassium, patient having both diarrhea and intermittently using Lasix, without potassium medication support.  Patient should be able to manage improving the mild potassium abnormality by oral dietary supplementation.  CRITICAL CARE-no Performed by: Daleen Bo  Nursing Notes Reviewed/ Care Coordinated Applicable Imaging Reviewed Interpretation of Laboratory Data incorporated into ED treatment  The patient appears reasonably screened and/or stabilized for discharge and I doubt any other medical condition or other [facility] requiring further screening, evaluation, or treatment in the ED at this time prior to discharge.  Plan: Home Medications-continue usual; Home Treatments-gradual advance diet and include potassium; return here if the recommended treatment, does not improve the symptoms; Recommended follow up-PCP checkup 1 week and as needed     Final Clinical Impression(s) / ED Diagnoses Final diagnoses:  Nausea vomiting and diarrhea    Rx / DC Orders ED Discharge Orders         Ordered    ondansetron (ZOFRAN) 8 MG tablet  Every 8 hours PRN     Discontinue  Reprint     01/01/20 1711           Daleen Bo, MD 01/01/20 1713

## 2020-01-01 NOTE — ED Triage Notes (Signed)
Pt here for eval of n/v/d since Wednesday, same symptoms as others in her household who are checked in for same. Has had both covid vaccines.

## 2020-01-01 NOTE — Discharge Instructions (Signed)
Today did not show any serious problems.  Your potassium was a little bit low which can be low from taking Lasix, and also from diarrhea.  Try to eat some foods which contain potassium such as bananas and greens.  Start with a clear liquid diet and gradually advance to regular foods as tolerated over the next couple of days.  Follow-up with your doctor for checkup if not better in 1 week.

## 2020-01-01 NOTE — ED Notes (Signed)
Pt reports she feels much better than when she first came into ED.  No c/o pain, mild nausea.

## 2020-03-22 ENCOUNTER — Other Ambulatory Visit: Payer: Self-pay | Admitting: Internal Medicine

## 2020-03-22 DIAGNOSIS — N1831 Chronic kidney disease, stage 3a: Secondary | ICD-10-CM

## 2020-03-28 ENCOUNTER — Ambulatory Visit
Admission: RE | Admit: 2020-03-28 | Discharge: 2020-03-28 | Disposition: A | Discharge status: Home / Self Care | Payer: Medicare Other | Source: Ambulatory Visit | Attending: Internal Medicine | Admitting: Internal Medicine

## 2020-03-28 DIAGNOSIS — N1831 Chronic kidney disease, stage 3a: Secondary | ICD-10-CM

## 2020-03-28 IMAGING — US US RENAL
1 series · 14 of 25 positions shown · non-contrast
Comparison: None.

CLINICAL DATA: Chronic kidney disease

EXAM:
RENAL / URINARY TRACT ULTRASOUND COMPLETE

[Series 1: us renal · 0.26mm/px · 14 of 40 slices shown]
[im 1/40]
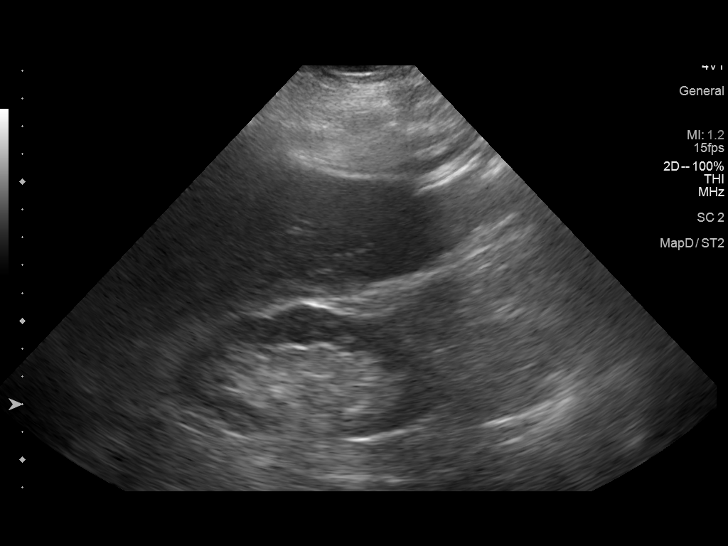
[im 4/40]
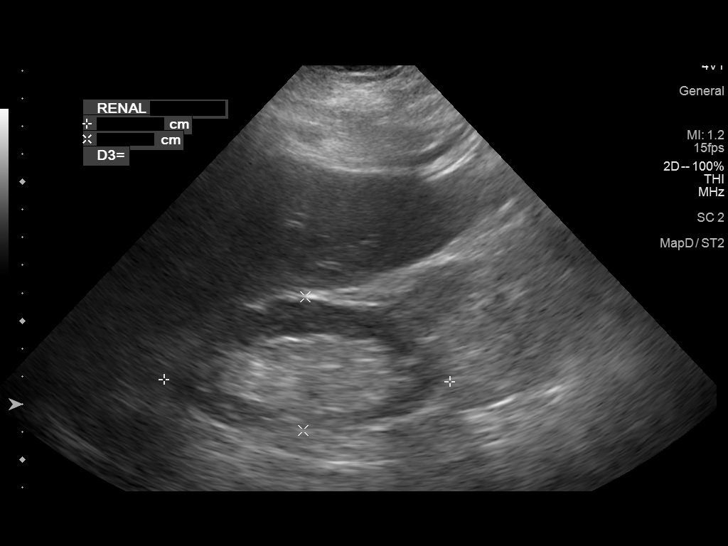
[im 7/40]
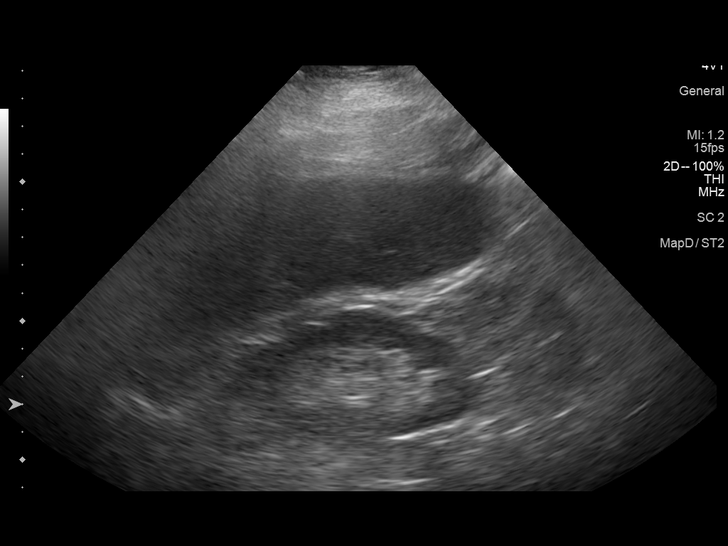
[im 10/40]
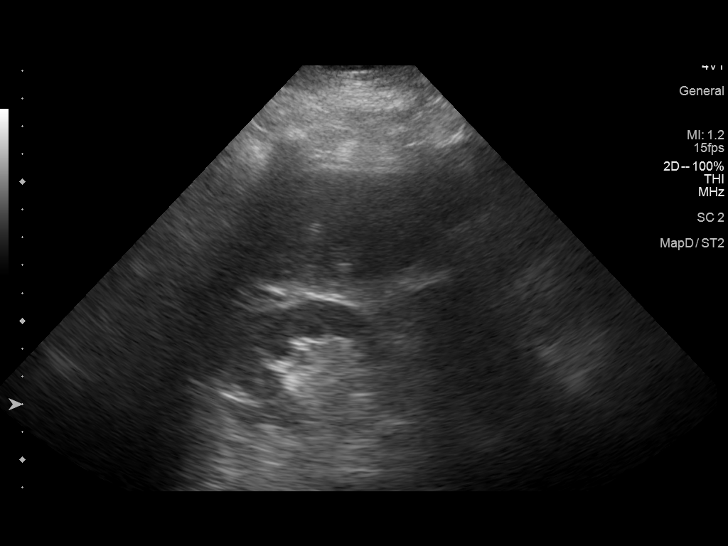
[im 14/40]
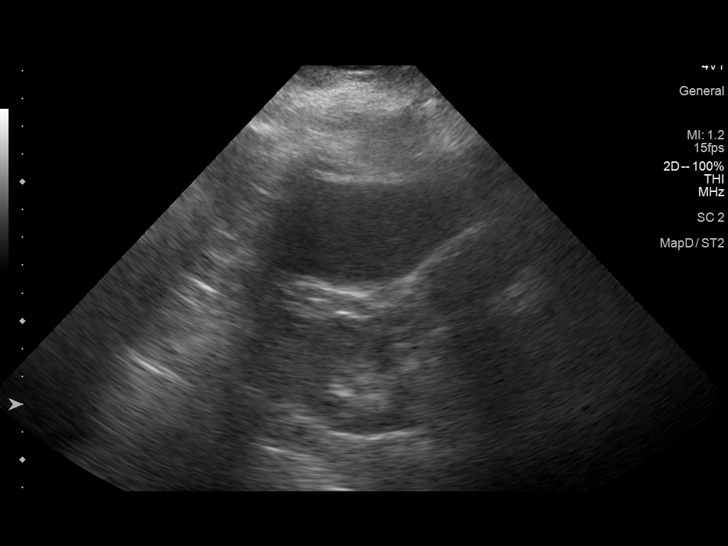
[im 15/40]
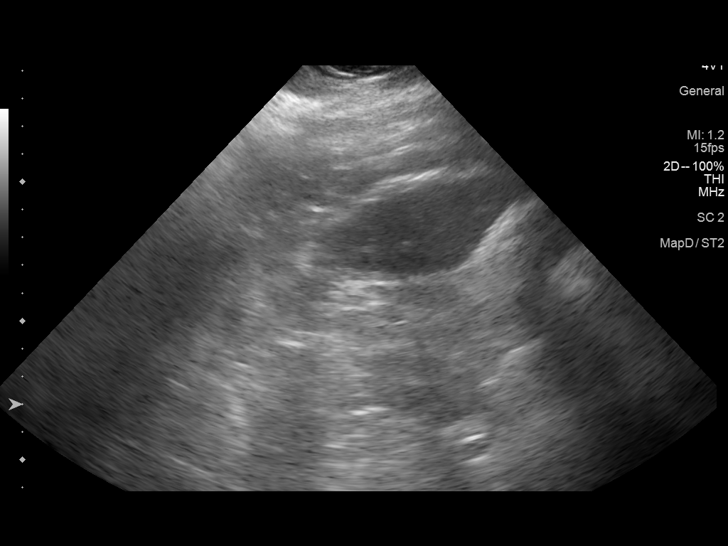
[im 18/40]
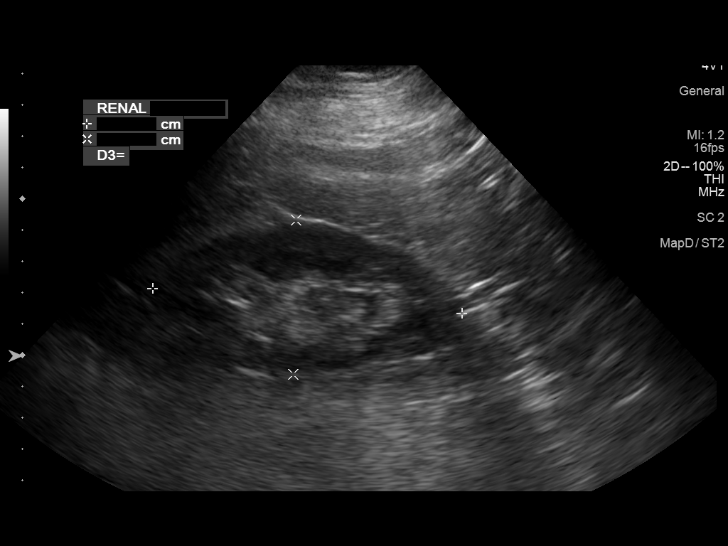
[im 22/40]
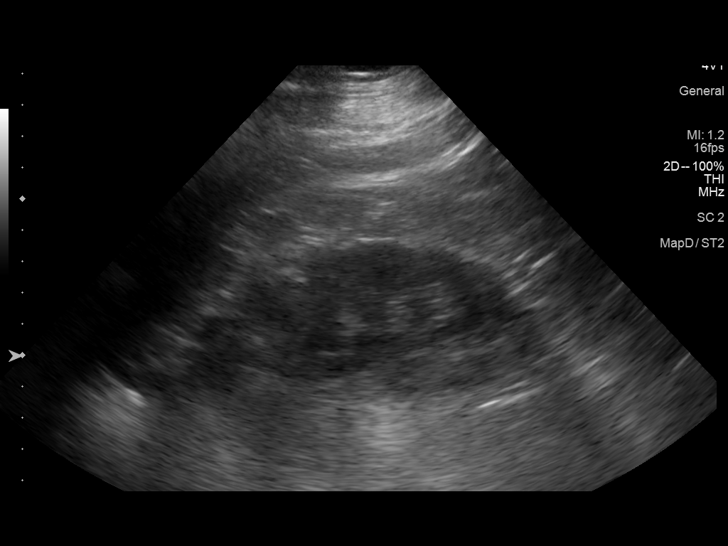
[im 25/40]
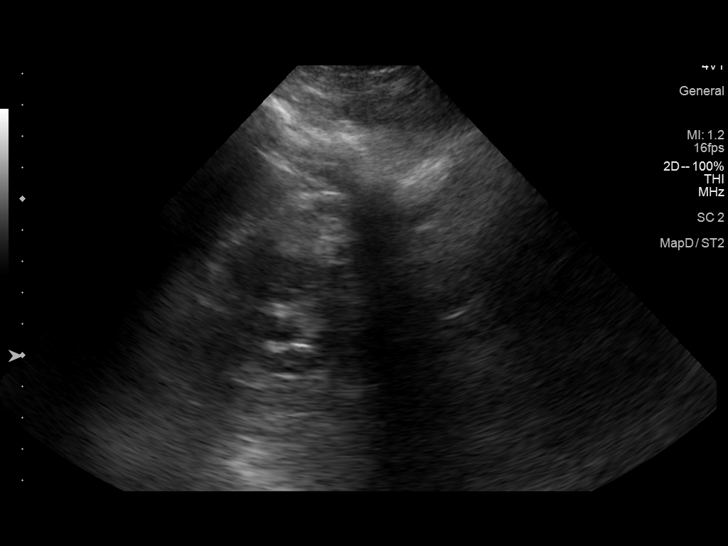
[im 27/40]
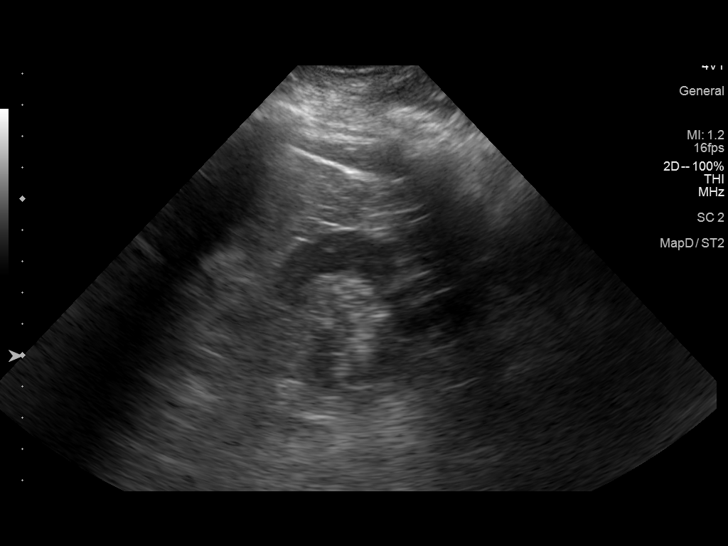
[im 30/40]
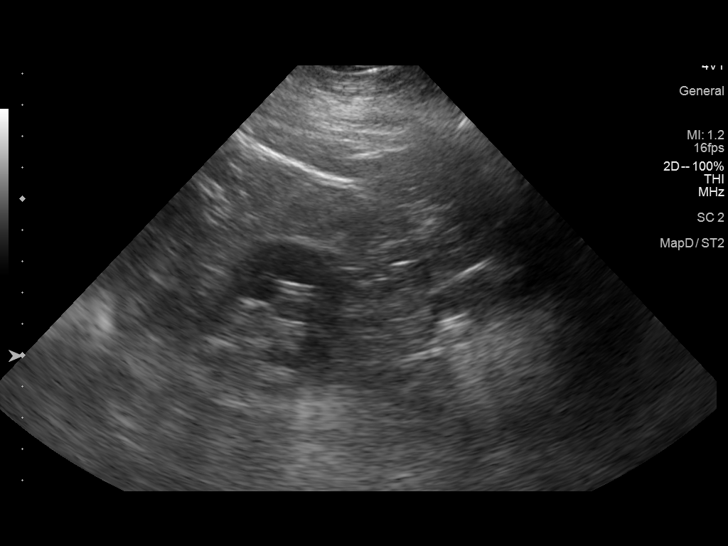
[im 33/40]
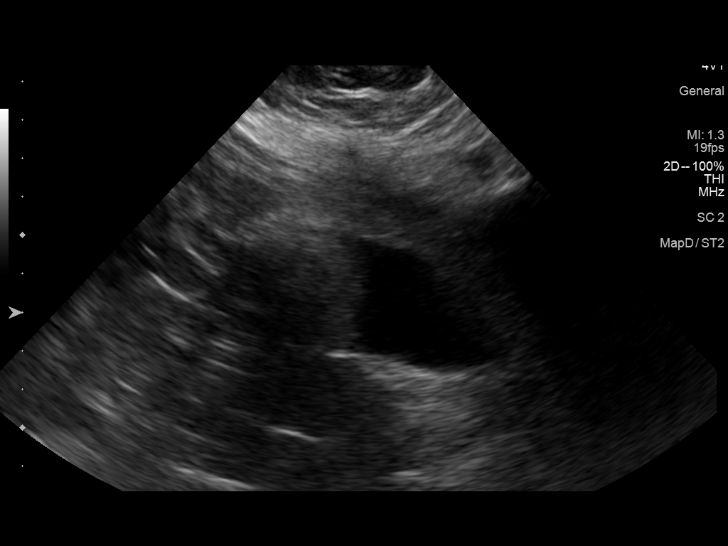
[im 36/40]
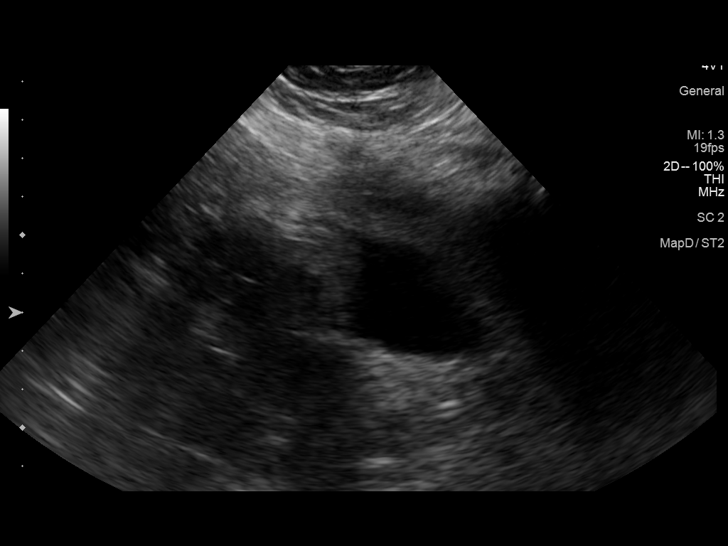
[im 40/40]
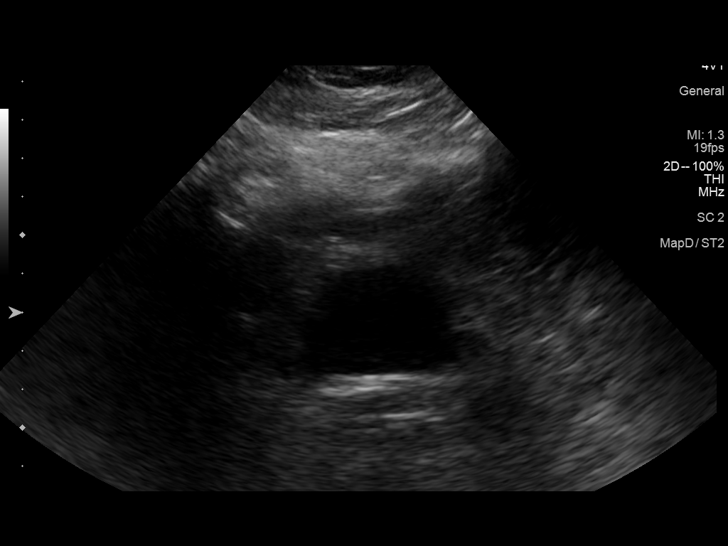

[14 of 25 positions shown; findings below may reference images not displayed]

FINDINGS: Right Kidney:

Renal measurements: 10.3 x 4.8 x 4.9 cm = volume: 127.9 mL.
Echogenicity and renal cortical thickness are within normal limits.
No mass, perinephric fluid, or hydronephrosis visualized. No
sonographically demonstrable calculus or ureterectasis.

Left Kidney:

Renal measurements: 9.9 x 4.9 x 3.9 cm = volume: 99.1 mL.
Echogenicity and renal cortical thickness are within normal limits.
No mass, perinephric fluid, or hydronephrosis visualized. No
sonographically demonstrable calculus or ureterectasis.

Bladder:

Appears normal for degree of bladder distention.

Other:

None.
IMPRESSION: Study within normal limits.

## 2020-04-18 ENCOUNTER — Encounter: Payer: Self-pay | Admitting: Neurology

## 2020-04-21 NOTE — Progress Notes (Signed)
Assessment/Plan:   1.  Tremor.  -Discussed with patient that she does not have Parkinson's disease. Discussed with patient that the diagnosis of essential tremor cannot be made in the face of tremor inducing medications, which she is on 65 several. The medications for her lungs, as well as Adderall can produce tremor.  She also had very little tremor noted on today's examination.  -pt states that PCP checks her TSH.    -Discussed with patient that in my experience, tremor medication does not work very well in patients who are on tremor inducing medication.  In addition, beta-blockers would not be advised in her given her degree of lung function/COPD.  We discussed primidone, but she states that her mom is on primidone and she accidentally took her mom's medication at one point in time and felt "drugged" for 3 days on 50 mg of primidone.  She reports an "allergy" to Topamax in that it made her headaches worse.  Those are the only first-line medications for tremor.  All other medications are second line, and would have a significantly higher side effect profile and I would not recommend them for her.  Given this, she and I did talk about weighted gloves as well as the [employer] device.  I told her to just try the weighted gloves off of [employer] first and she was given the information to this.  2.  The patient asked me about other things unrelated to my field.  She asked me about a lump behind her ear for which she has seen ENT physicians for approximately 5 years.  She states that it can get big and cause facial swelling.  Discussed with the patient that this is far out of my area of expertise and she would need to follow-up with primary care in that regard.  3.  Follow-up as needed   Subjective:   Theresa Fernandez was seen in consultation in the movement disorder clinic at the request of Simona Huh, NP. Patient is a 64 year old female with a history of anxiety, fibromyalgia, chronic pain, diabetes, COPD  who presents for the evaluation of tremor. Medical records made available to me have been reviewed.   Tremor started approximately one year ago and involves the bilateral UE and her abdomen.  States that she will be watching TV and suddenly her abdomen will jerk, as if "I have a big baby in there."  Tremor is most noticeable when texting or holding a glass.  She is R hand dominant so notices it most on the R hand.   There is a family hx of tremor in her mother and possibly father.    Affected by caffeine: doesn't know (drinks 1 cup coffee/tea per day) Affected by alcohol:  Doesn't drink alcohol Affected by stress:  unknown Affected by fatigue: unknown Spills soup if on spoon:  Yes.   Spills glass of liquid if full:  May or may not Affects ADL's (tying shoes, brushing teeth, etc):  Not usually  Current/Previously tried tremor medications: n/a  Current medications that may exacerbate tremor:  Adderall - been on it since about 2011, albuterol - uses 1 time per day and used it today,   Outside reports reviewed: lab reports, office notes and referral letter/letters.  Allergies  Allergen Reactions  . Ceftriaxone Hives and Rash    After second dose on 06/07/2018,  . Topamax [Topiramate] Other (See Comments)    Made headache worse  . Voltaren [Diclofenac Sodium] Other (See Comments)    Caused migraine  Current Outpatient Medications  Medication Instructions  . albuterol (ACCUNEB) 0.63 mg, Nebulization, Every 4 hours PRN  . albuterol (VENTOLIN HFA) 108 (90 Base) MCG/ACT inhaler 2 puffs, Inhalation, Every 6 hours PRN  . ALPRAZolam (XANAX) 0.5 mg, Oral, Daily at bedtime  . amphetamine-dextroamphetamine (ADDERALL) 20 MG tablet 20 mg, Oral, 3 times daily  . busPIRone (BUSPAR) 30 mg, Oral, Daily at bedtime  . carisoprodol (SOMA) 250 mg, Oral, See admin instructions, Take one tablet (250 mg) by mouth twice daily - 7pm and midnight  . furosemide (LASIX) 40 mg, Oral, 2 times daily  .  HYDROcodone-acetaminophen (NORCO) 10-325 MG tablet 1 tablet, Oral, Every 6 hours PRN  . hydrOXYzine (ATARAX/VISTARIL) 25 mg, Oral, Daily at bedtime  . ibuprofen (ADVIL) 400 mg, Oral, Every 6 hours PRN  . lamoTRIgine (LAMICTAL) 25 mg, Oral, Daily at bedtime  . lisinopril (ZESTRIL) 2.5 mg, Oral, Daily, For kidney function  . Magnesium 500-1,000 mg, Oral, See admin instructions, Take 1 tablet (500 mg) by mouth every morning and 2 tablets (1000 mg) at night  . metolazone (ZAROXOLYN) 2.5 mg, Oral, Every other day, As needed for weight gain of 2 lbs  . omeprazole (PRILOSEC) 40 mg, Daily  . ondansetron (ZOFRAN) 8 mg, Oral, Every 8 hours PRN  . pioglitazone (ACTOS) 15 mg, Oral, Daily  . Potassium 99 mg, Oral, Daily at bedtime  . prazosin (MINIPRESS) 5 mg, Oral, Daily at bedtime  . pregabalin (LYRICA) 150 mg, Oral, Daily at bedtime  . sertraline (ZOLOFT) 200 mg, Oral, Daily at bedtime  . Vitamin D (Ergocalciferol) (DRISDOL) 50,000 Units, Oral, Every Tue     Objective:   VITALS:   Vitals:   04/25/20 0846  BP: 120/76  Pulse: 89  SpO2: 92%  Weight: 277 lb (125.6 kg)  Height: 5\' 8"  (1.727 m)   Gen:  Appears stated age and in NAD. HEENT:  Normocephalic, atraumatic. The mucous membranes are moist. The superficial temporal arteries are without ropiness or tenderness. Cardiovascular: Regular rate and rhythm. Lungs: Clear to auscultation bilaterally.  She has DOE (wearing O2).   Neck: There are no carotid bruits noted bilaterally.  No lymph nodes noted behind ear.  NEUROLOGICAL:  Orientation:  The patient is alert and oriented x 3.   Cranial nerves: There is good facial symmetry. Extraocular muscles are intact and visual fields are full to confrontational testing. Speech is fluent and clear. Soft palate rises symmetrically and there is no tongue deviation. Hearing is intact to conversational tone. Tone: Tone is good throughout. Sensation: Sensation is intact to light touch touch throughout  (facial, trunk, extremities). Vibration is intact at the bilateral big toe but decreased distally overall. There is no extinction with double simultaneous stimulation. There is no sensory dermatomal level identified. Coordination:  The patient has no dysdiadichokinesia or dysmetria. Motor: Strength is 5/5 in the bilateral upper and lower extremities.  Shoulder shrug is equal bilaterally.  There is no pronator drift.  There are no fasciculations noted. DTR's: Deep tendon reflexes are 0-1/4 at the bilateral biceps, triceps, brachioradialis, patella and achilles.  Plantar responses are downgoing bilaterally. Gait and Station: The patient uses walker to ambulate.  Wide based.  Mostly limited by doe.  MOVEMENT EXAM:  Tremor:  There is no tremor in the UE, noted most significantly with action.  The patient is  able to draw Archimedes spirals without significant difficulty.  There is no tremor at rest.  The patient is able to pour water from one glass to another  without spilling it but she is a little tremulous  I have reviewed and interpreted the following labs independently   Chemistry      Component Value Date/Time   NA 141 01/01/2020 1039   NA 145 (H) 10/21/2018 1431   K 3.3 (L) 01/01/2020 1039   CL 103 01/01/2020 1039   CO2 25 01/01/2020 1039   BUN 15 01/01/2020 1039   BUN 34 (H) 10/21/2018 1431   CREATININE 1.31 (H) 01/01/2020 1039   CREATININE 0.89 02/11/2013 0922      Component Value Date/Time   CALCIUM 9.1 01/01/2020 1039   ALKPHOS 91 01/01/2020 1039   AST 19 01/01/2020 1039   ALT 16 01/01/2020 1039   BILITOT 0.7 01/01/2020 1039      Lab Results  Component Value Date   WBC 12.3 (H) 01/01/2020   HGB 12.1 01/01/2020   HCT 39.3 01/01/2020   MCV 95.2 01/01/2020   PLT 301 01/01/2020   Lab Results  Component Value Date   TSH 3.678 02/11/2013      Total time spent on today's visit was 45 minutes, including both face-to-face time and nonface-to-face time.  Time included that  spent on review of records (prior notes available to me/labs/imaging if pertinent), discussing treatment and goals, answering patient's questions and coordinating care.  CC:  [facility]

## 2020-04-25 ENCOUNTER — Other Ambulatory Visit: Payer: Self-pay

## 2020-04-25 ENCOUNTER — Ambulatory Visit (INDEPENDENT_AMBULATORY_CARE_PROVIDER_SITE_OTHER): Payer: Medicare Other | Admitting: Neurology

## 2020-04-25 ENCOUNTER — Encounter: Payer: Self-pay | Admitting: Neurology

## 2020-04-25 VITALS — BP 120/76 | HR 89 | Ht 68.0 in | Wt 277.0 lb

## 2020-04-25 DIAGNOSIS — G251 Drug-induced tremor: Secondary | ICD-10-CM | POA: Diagnosis not present

## 2020-04-25 NOTE — Patient Instructions (Signed)
1.  You can try the weighted gloves off of [employer] 2.  You can look up the [employer] that we discussed  The physicians and staff at [facility] are committed to providing excellent care. You may receive a survey requesting feedback about your experience at our office. We strive to receive "very good" responses to the survey questions. If you feel that your experience would prevent you from giving the office a "very good " response, please contact our office to try to remedy the situation. We may be reached at [phone]. Thank you for taking the time out of your busy day to complete the survey.

## 2020-05-09 ENCOUNTER — Other Ambulatory Visit: Payer: Self-pay | Admitting: Orthopedic Surgery

## 2020-05-09 DIAGNOSIS — M542 Cervicalgia: Secondary | ICD-10-CM

## 2020-05-09 DIAGNOSIS — M546 Pain in thoracic spine: Secondary | ICD-10-CM

## 2020-05-09 DIAGNOSIS — M545 Low back pain, unspecified: Secondary | ICD-10-CM

## 2020-05-30 ENCOUNTER — Ambulatory Visit
Admission: RE | Admit: 2020-05-30 | Discharge: 2020-05-30 | Disposition: A | Discharge status: Home / Self Care | Payer: Medicare Other | Source: Ambulatory Visit | Attending: Orthopedic Surgery | Admitting: Orthopedic Surgery

## 2020-05-30 DIAGNOSIS — M545 Low back pain, unspecified: Secondary | ICD-10-CM

## 2020-05-30 DIAGNOSIS — M546 Pain in thoracic spine: Secondary | ICD-10-CM

## 2020-05-30 DIAGNOSIS — M542 Cervicalgia: Secondary | ICD-10-CM

## 2020-05-30 IMAGING — MR MR LUMBAR SPINE W/O CM
4 of 5 series · 19 of 48 positions shown · non-contrast
Comparison: Lumbar spine CT [DATE]

CLINICAL DATA: Low back pain. Pain in the arms and legs with
numbness in the arms. History of thoracic and lumbar compression
fractures.

EXAM:
MRI LUMBAR SPINE WITHOUT CONTRAST
TECHNIQUE: Multiplanar, multisequence MR imaging of the lumbar spine was
performed. No intravenous contrast was administered.

[Series 6: T1 · sagittal · 4.0mm · 0.73mm/px · 3 of 15 slices shown (1 of 2)]
[im 3/15]
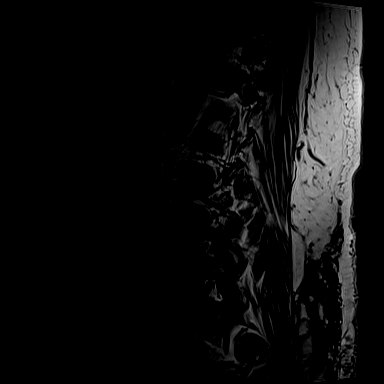
[im 9/15]
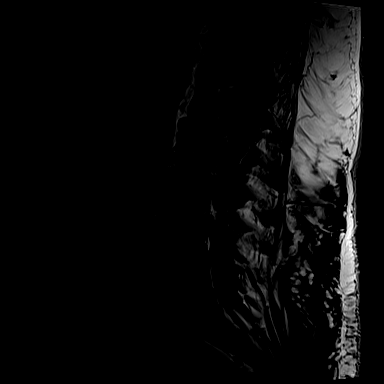
[im 15/15]
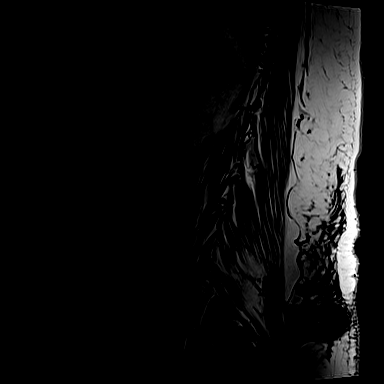

[Series 8: T2 · sagittal · 4.0mm · 0.73mm/px · 5 of 15 slices shown (1 of 2)]
[im 1/15]
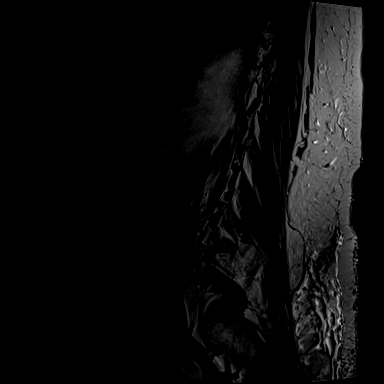
[im 4/15]
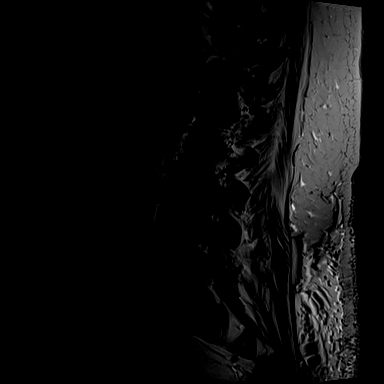
[im 8/15]
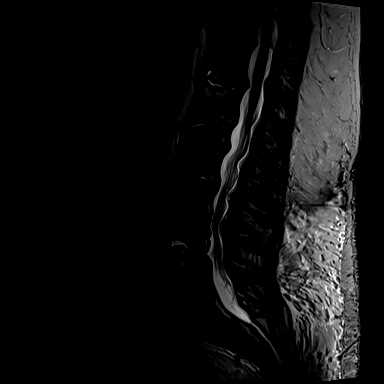
[im 11/15]
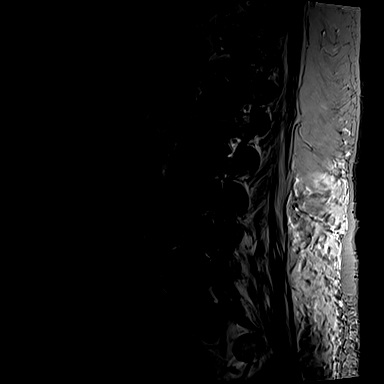
[im 15/15]
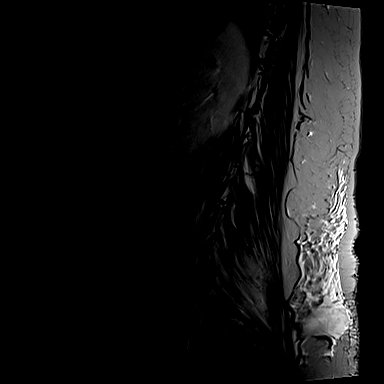

[Series 11: T1 · axial · 4.0mm · 0.28mm/px · z∈[-22,+142]mm · 3 of 43 slices shown (2 of 2)]
[im 6/43]
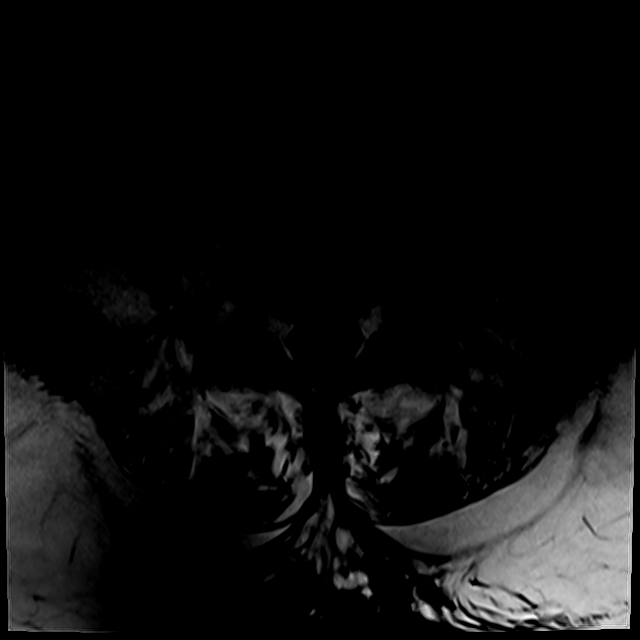
[im 23/43]
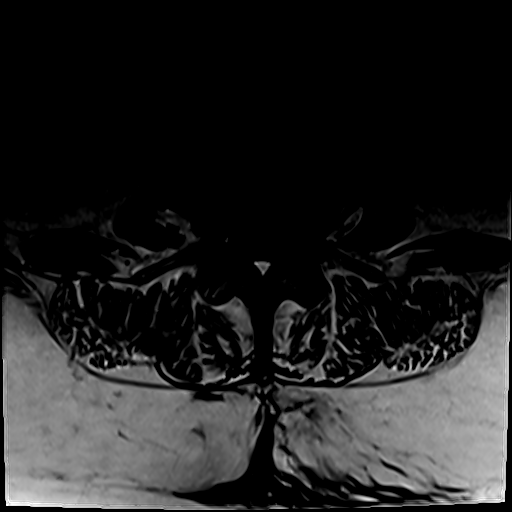
[im 37/43]
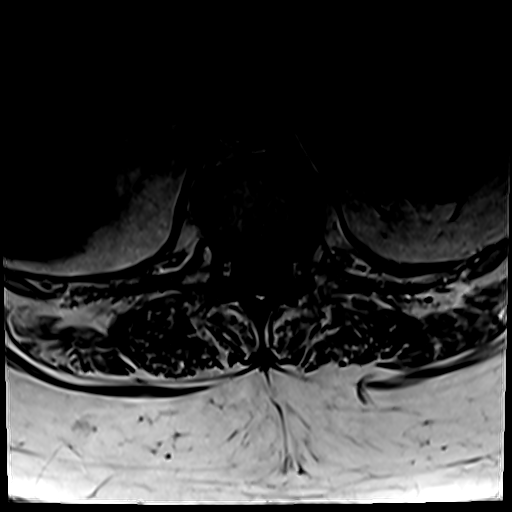

[Series 14: T2 · axial · 4.0mm · 0.28mm/px · z∈[-37,+142]mm · 8 of 43 slices shown (2 of 2)]
[im 3/43]
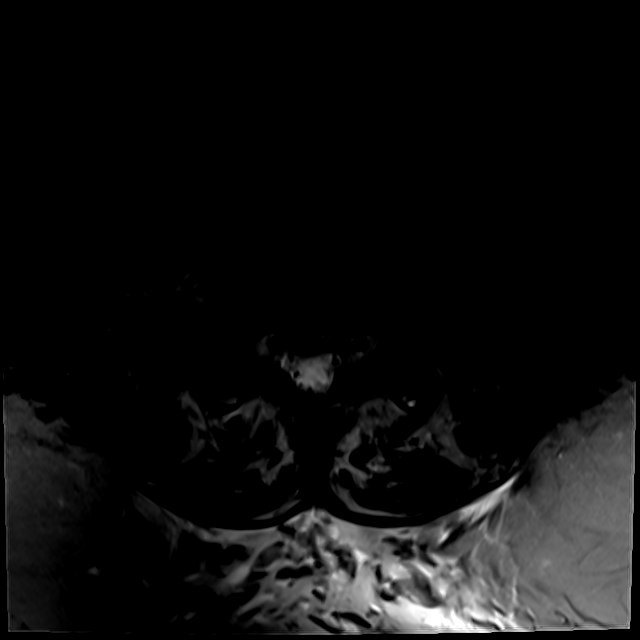
[im 6/43]
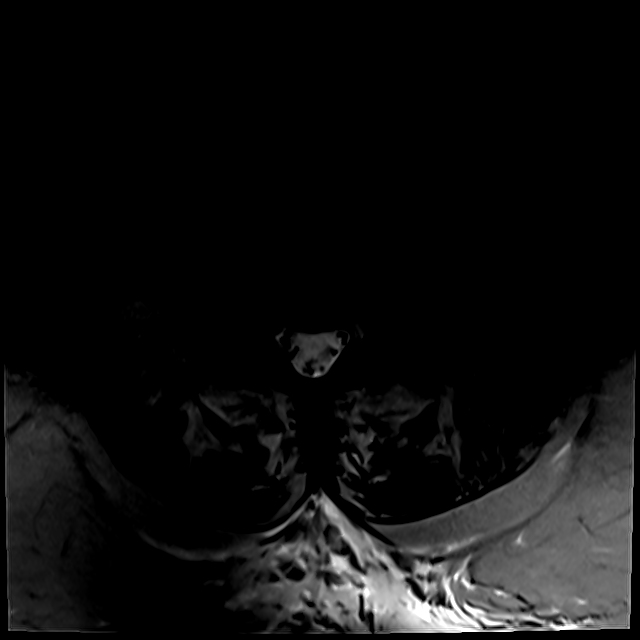
[im 9/43]
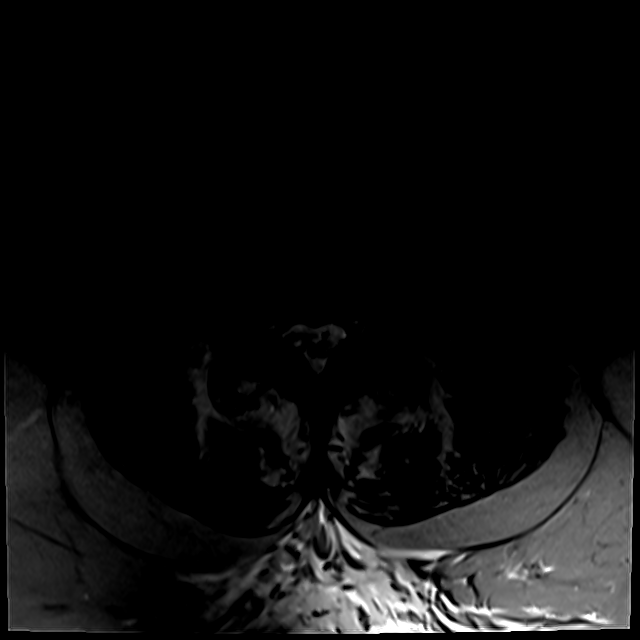
[im 15/43]
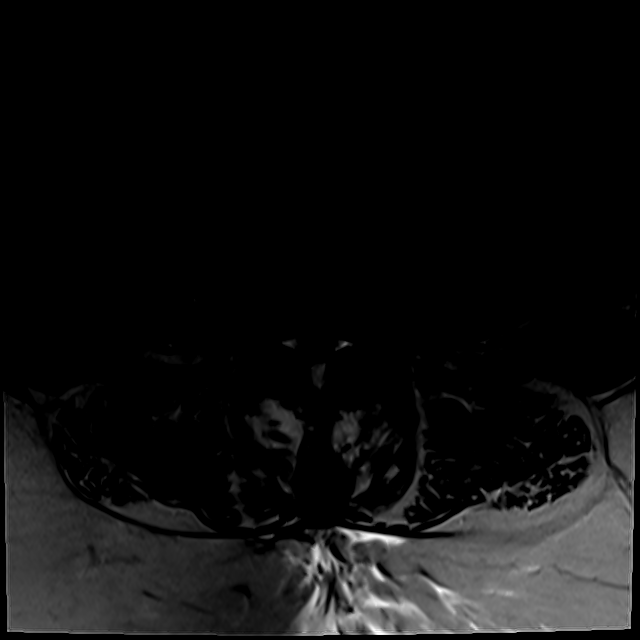
[im 20/43]
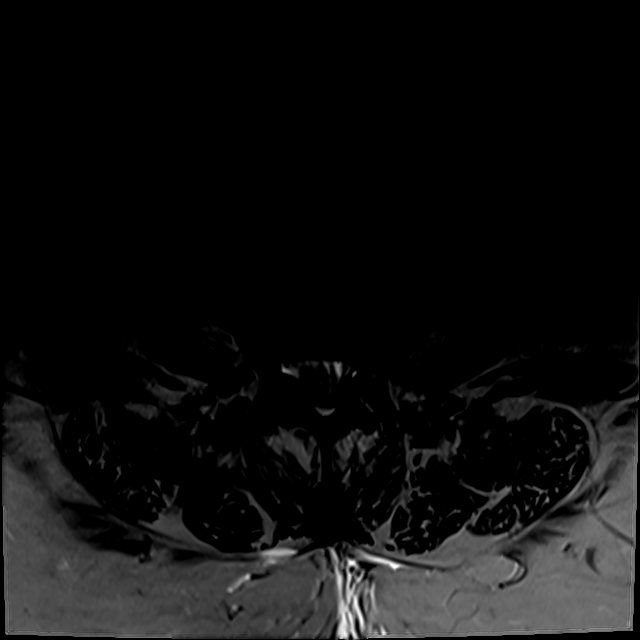
[im 23/43]
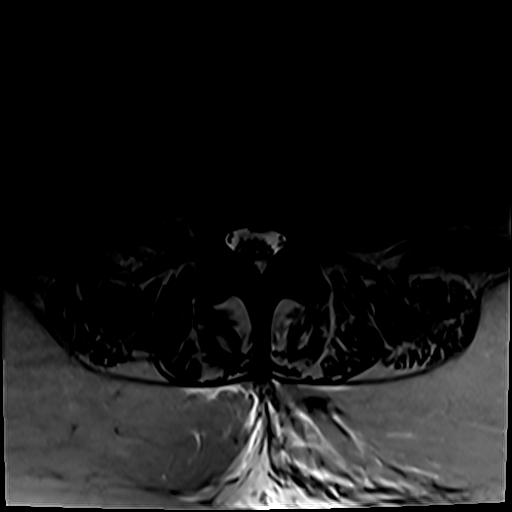
[im 26/43]
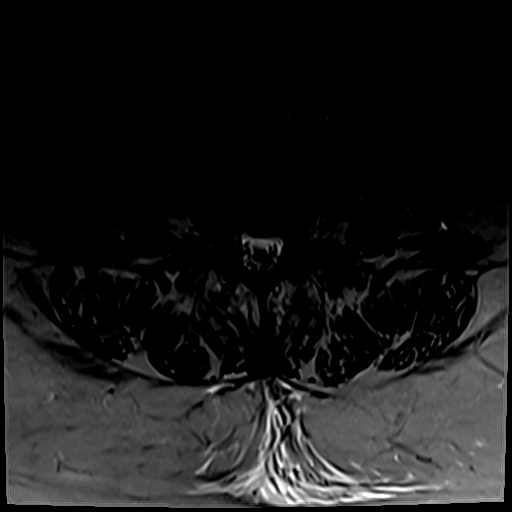
[im 37/43]
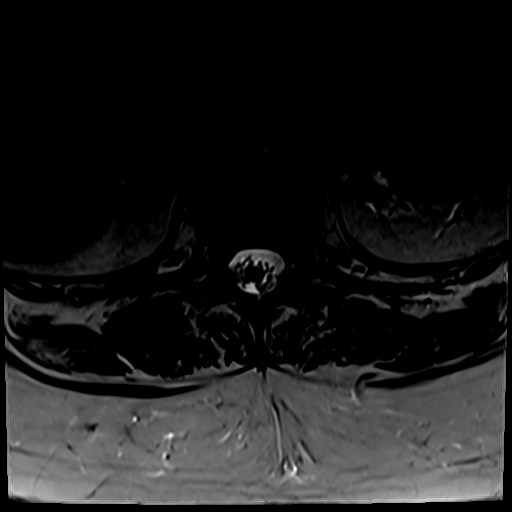

[19 of 48 positions shown; findings below may reference images not displayed]

FINDINGS: Segmentation:  Standard.

Alignment:  Trace anterolisthesis of L4 on L5.

Vertebrae: Chronic severe L1 and mild L3 and L5 compression
fractures with unchanged height loss compared to the prior CT.
Interval but chronic L2 inferior endplate compression fracture with
mild height loss. L4 compression fracture with progressive height
loss now measuring 65% with moderate marrow edema consistent with an
acute on chronic fracture. No suspicious marrow lesion.

Conus medullaris and cauda equina: Conus extends to the L1 level.
Conus and cauda equina appear normal.

Paraspinal and other soft tissues: Unremarkable.

Disc levels:

L1-2: Mild circumferential disc bulging result in borderline to mild
left lateral recess stenosis without spinal or neural foraminal
stenosis.

L2-3: Circumferential disc bulging and mild facet and ligamentum
flavum hypertrophy result in borderline spinal stenosis and mild to
moderate bilateral neural foraminal stenosis, mildly progressed from
the prior CT.

L3-4: Circumferential disc bulging, prominent dorsal epidural fat,
and moderate facet and ligamentum flavum hypertrophy result in
moderate spinal stenosis and moderate bilateral neural foraminal
stenosis, not significantly changed.

L4-5: Minimal disc bulging, ligamentum flavum hypertrophy, and
severe facet arthrosis result in mild spinal stenosis and mild
bilateral neural foraminal stenosis, unchanged.

L5-S1: Minimal disc bulging and moderate facet arthrosis without
stenosis, unchanged.
IMPRESSION: 1. Acute on chronic L4 compression fracture with 65% height loss.
2. Chronic compression fractures at all other lumbar levels.
3. Mild progression of mild-to-moderate bilateral neural foraminal
stenosis at L2-3.
4. Unchanged moderate spinal stenosis and moderate bilateral neural
foraminal stenosis at L3-4.
5. Unchanged mild spinal stenosis at L4-5.

## 2020-05-30 IMAGING — MR MR CERVICAL SPINE W/O CM
5 series · 39 of 48 positions shown · non-contrast
Comparison: None.

CLINICAL DATA: Cervicalgia.  Pain and numbness in the arms.

EXAM:
MRI CERVICAL SPINE WITHOUT CONTRAST
TECHNIQUE: Multiplanar, multisequence MR imaging of the cervical spine was
performed. No intravenous contrast was administered.

[Series 15: T1 · sagittal · 4.0mm · 0.73mm/px · 6 of 15 slices shown]
[im 1/15]
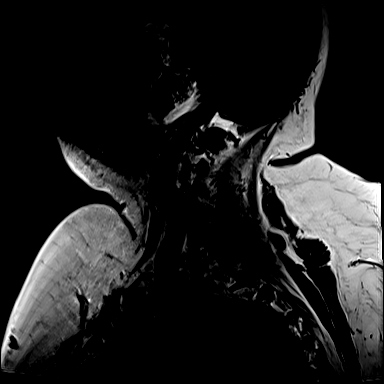
[im 3/15]
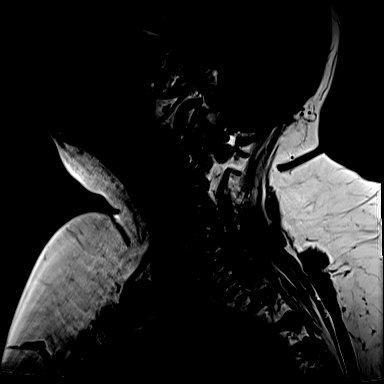
[im 6/15]
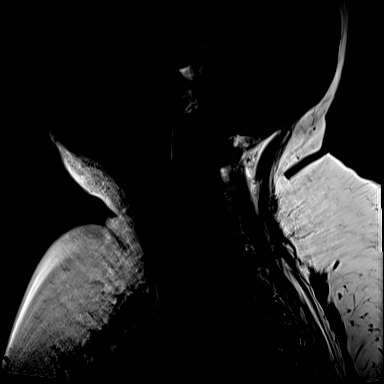
[im 9/15]
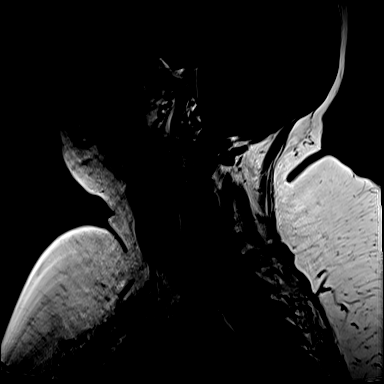
[im 12/15]
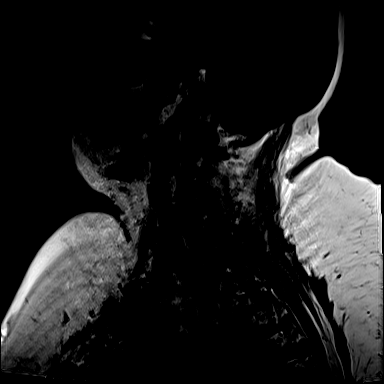
[im 15/15]
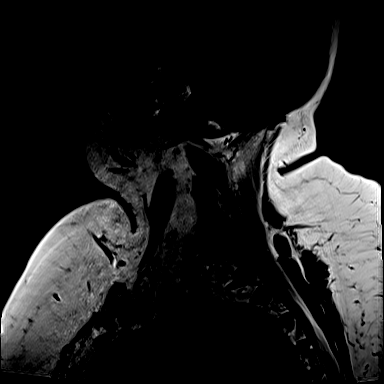

[Series 16: STIR · sagittal · 4.0mm · 0.88mm/px · 6 of 15 slices shown]
[im 1/15]
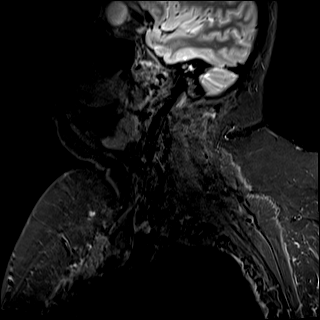
[im 3/15]
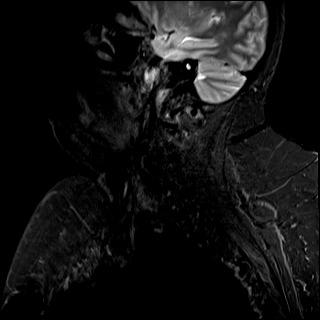
[im 6/15]
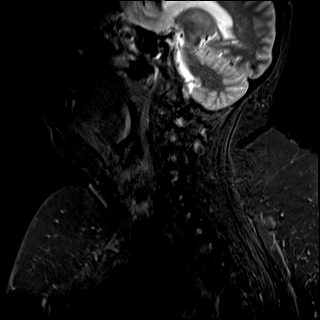
[im 9/15]
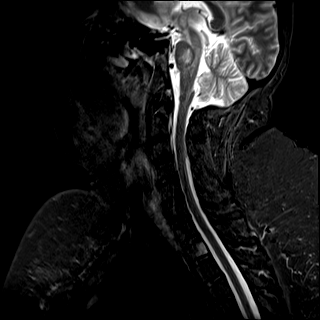
[im 12/15]
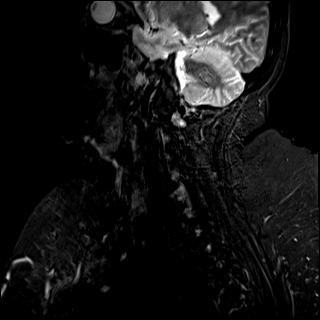
[im 15/15]
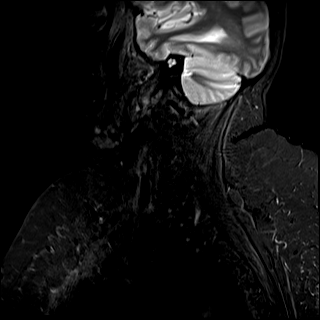

[Series 17: T2 · sagittal · 4.0mm · 0.55mm/px · 6 of 15 slices shown (1 of 2)]
[im 1/15]
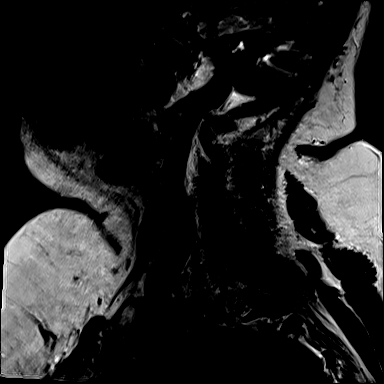
[im 3/15]
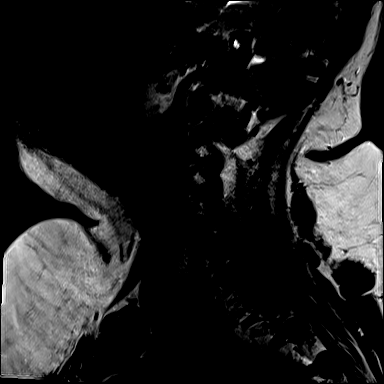
[im 6/15]
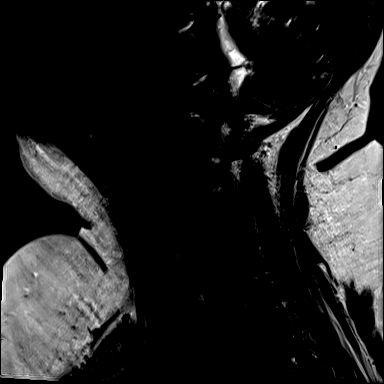
[im 9/15]
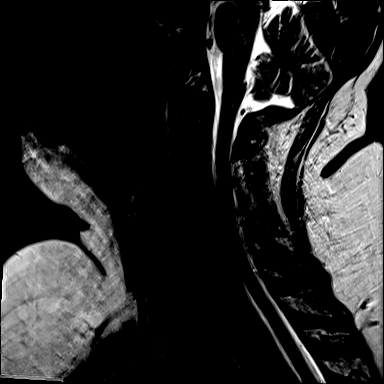
[im 12/15]
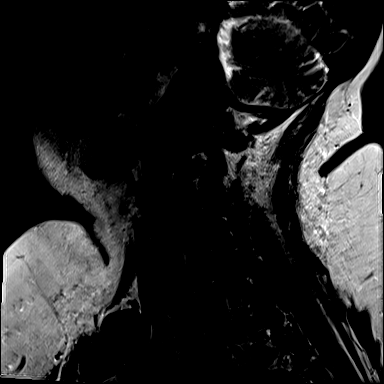
[im 15/15]
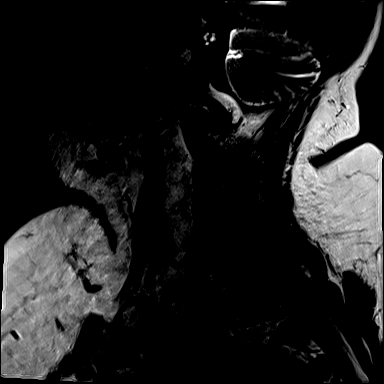

[Series 18: T2 · axial · 3.0mm · 0.50mm/px · z∈[-113,-0]mm · 13 of 37 slices shown (2 of 2)]
[im 1/37]
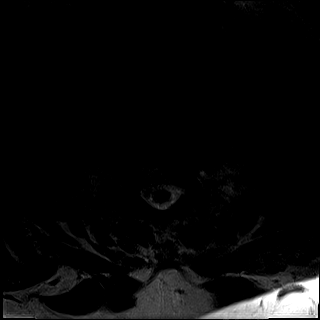
[im 3/37]
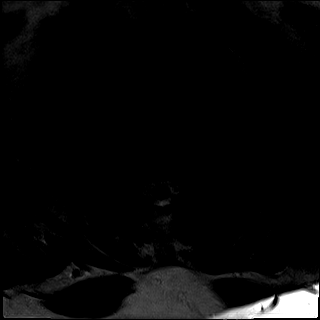
[im 6/37]
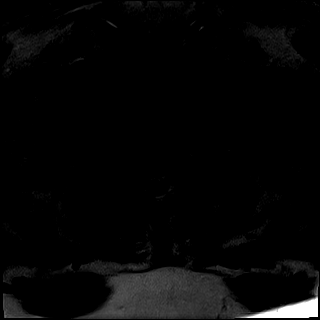
[im 8/37]
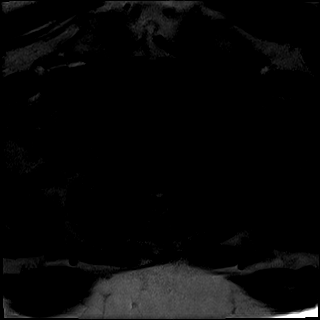
[im 11/37]
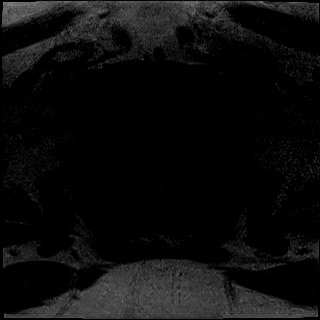
[im 13/37]
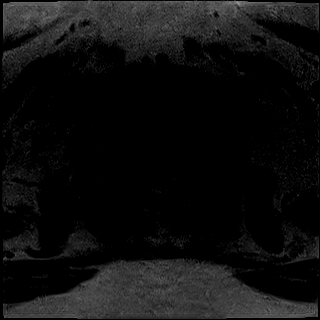
[im 16/37]
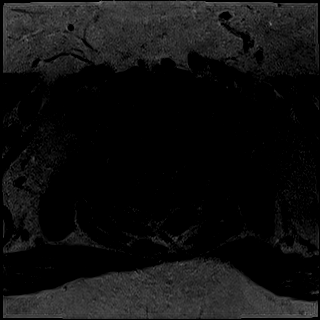
[im 19/37]
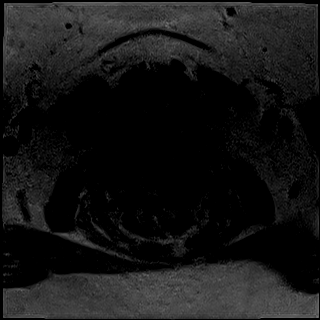
[im 21/37]
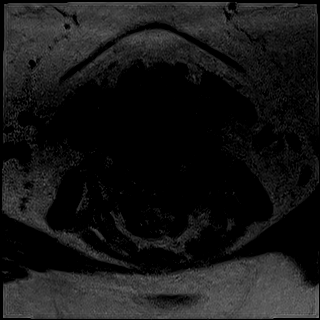
[im 24/37]
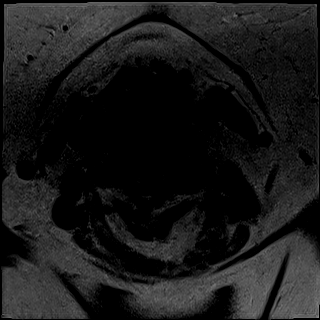
[im 26/37]
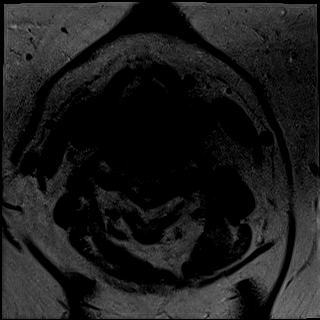
[im 31/37]
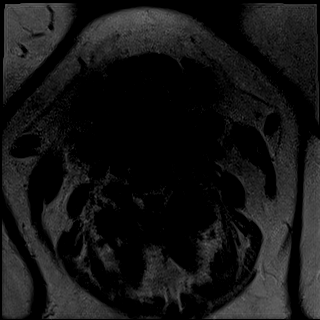
[im 37/37]
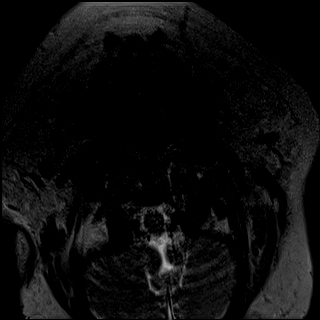

[Series 19: GRE · axial · 3.0mm · 0.42mm/px · z∈[-113,-0]mm · 8 of 37 slices shown]
[im 1/37]
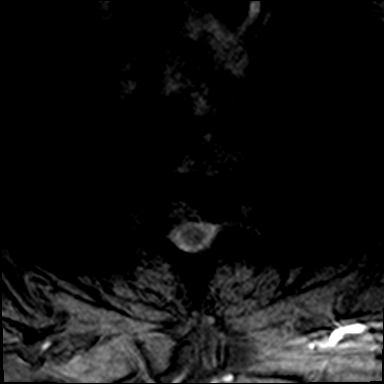
[im 6/37]
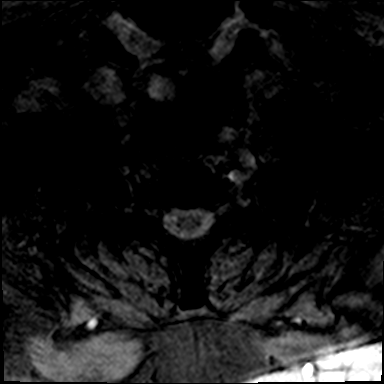
[im 11/37]
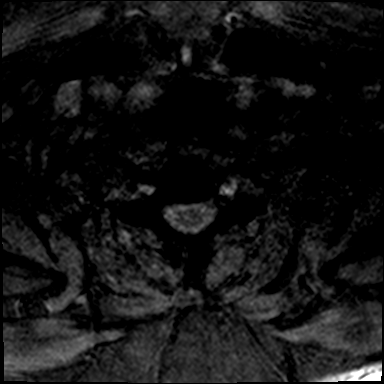
[im 16/37]
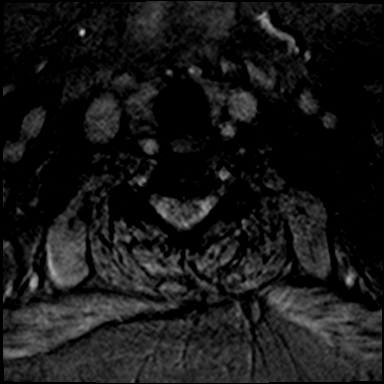
[im 21/37]
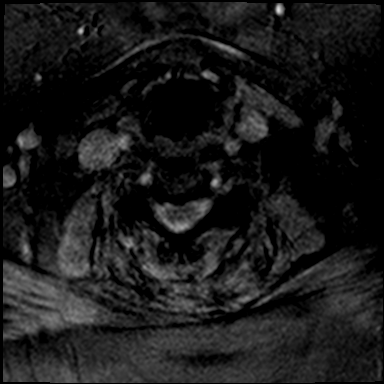
[im 26/37]
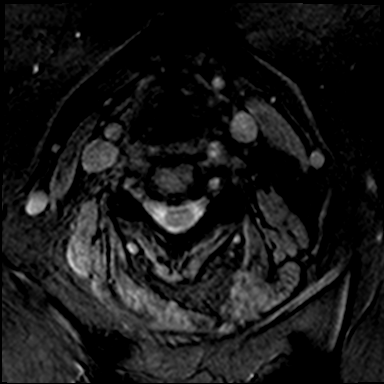
[im 31/37]
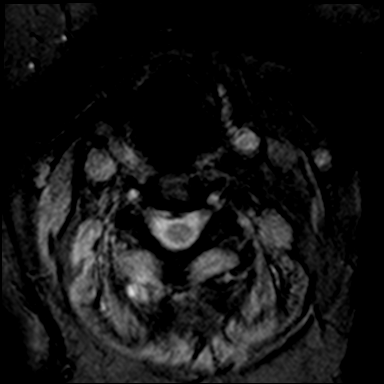
[im 37/37]
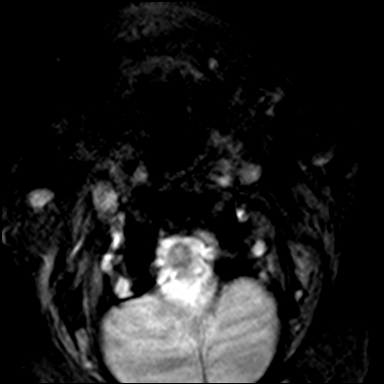

[39 of 48 positions shown; findings below may reference images not displayed]

FINDINGS: Despite the best efforts of the patient and technologist, the study
is moderately motion degraded.

Alignment: Normal.

Vertebrae: No fracture, suspicious osseous lesion, or significant
marrow edema in the cervical spine. Abnormal marrow signal in the T1
vertebral body, more fully evaluated on separate thoracic spine MRI.

Cord: Normal signal and morphology.

Posterior Fossa, vertebral arteries, paraspinal tissues: Patchy T2
hyperintensity in the pons, nonspecific though most often seen with
chronic small vessel ischemia. Grossly preserved vertebral artery
flow voids.

Disc levels:

Intervertebral disc space heights are preserved throughout the
cervical spine. There is mild cervical facet arthrosis, most notable
on the left at C3-4. There may be a small central disc protrusion at
C4-5 with assessment limited by motion. No large disc herniation is
present, and the spinal canal and neural foramina are patent
throughout.
IMPRESSION: 1. Motion degraded examination.
2. Mild cervical facet arthrosis and possible small central disc
protrusion at C4-5. No evidence of significant stenosis.

## 2020-05-30 IMAGING — MR MR THORACIC SPINE W/O CM
4 of 6 series · 19 of 48 positions shown · non-contrast
Comparison: Thoracic spine MRI [DATE].  Chest CT [DATE].

CLINICAL DATA: Thoracic spine pain. Pain in the arms and legs with
numbness in the arms. History of thoracic compression fractures.

EXAM:
MRI THORACIC SPINE WITHOUT CONTRAST
TECHNIQUE: Multiplanar, multisequence MR imaging of the thoracic spine was
performed. No intravenous contrast was administered.

[Series 18: T1 · sagittal · 3.0mm · 0.83mm/px · 3 of 17 slices shown]
[im 1/17]
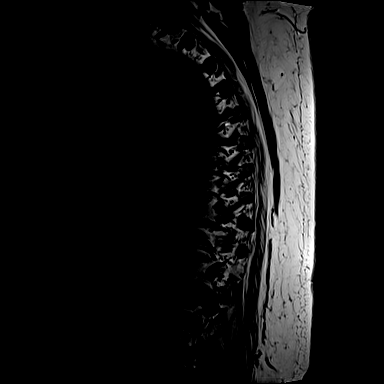
[im 9/17]
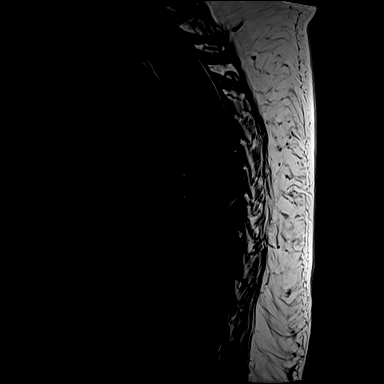
[im 17/17]
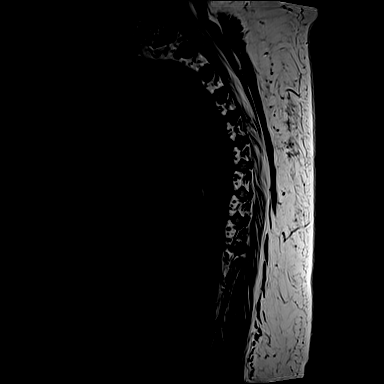

[Series 19: STIR · sagittal · 3.0mm · 1.00mm/px · 3 of 17 slices shown]
[im 1/17]
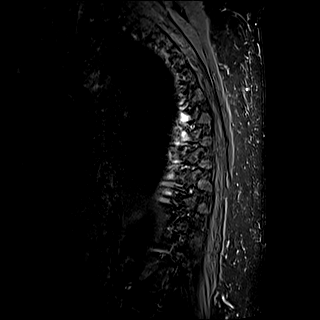
[im 9/17]
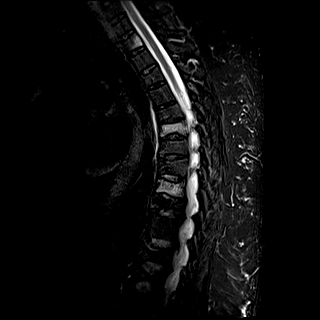
[im 17/17]
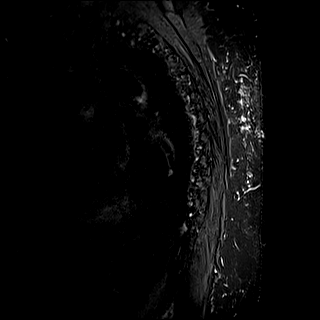

[Series 20: T2 · sagittal · 3.0mm · 0.83mm/px · 5 of 17 slices shown (1 of 2)]
[im 1/17]
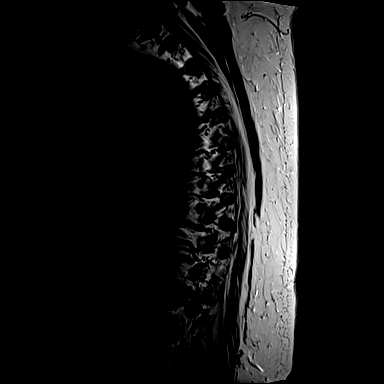
[im 5/17]
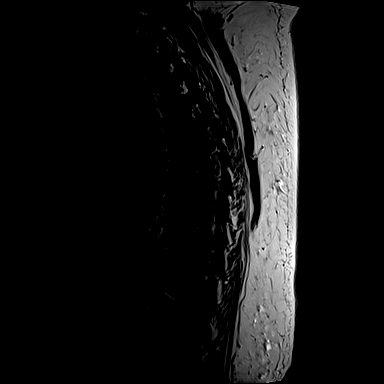
[im 9/17]
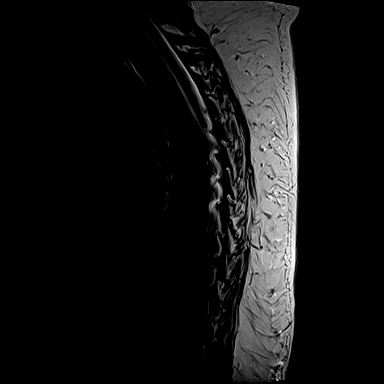
[im 13/17]
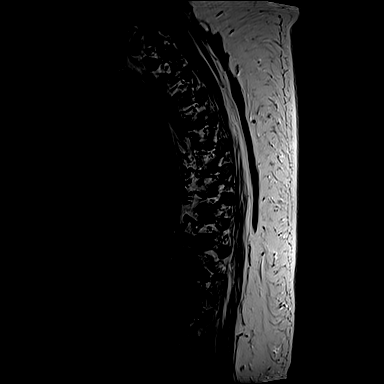
[im 17/17]
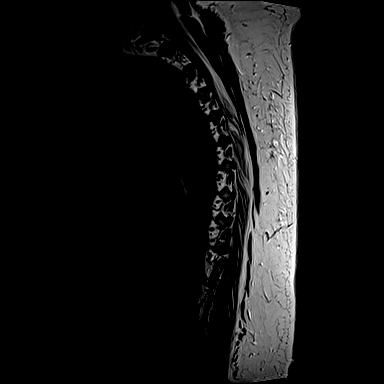

[Series 21: T2 · axial · 4.0mm · 0.28mm/px · z∈[-305,-125]mm · 8 of 54 slices shown (2 of 2)]
[im 1/54]
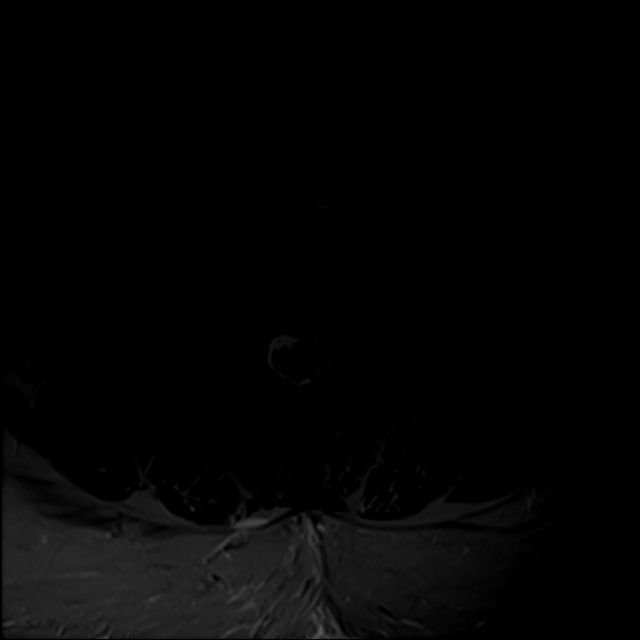
[im 8/54]
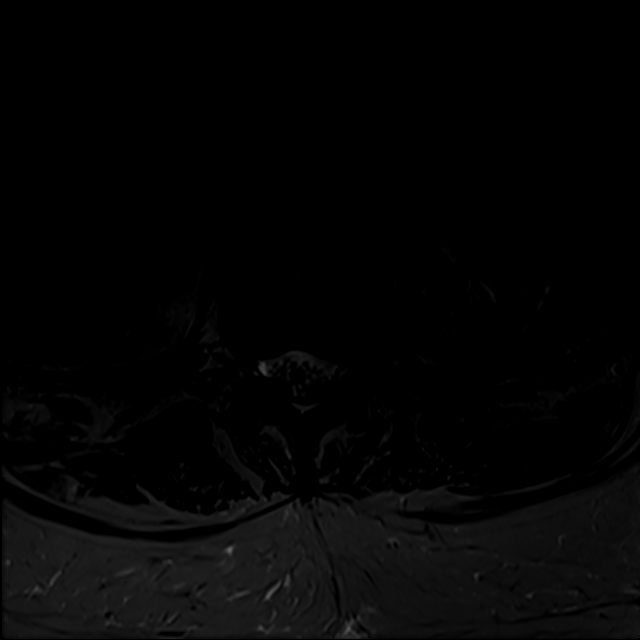
[im 16/54]
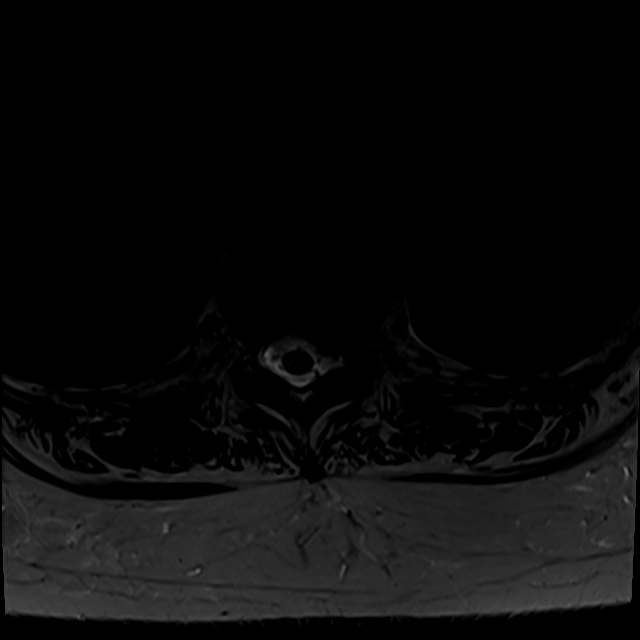
[im 23/54]
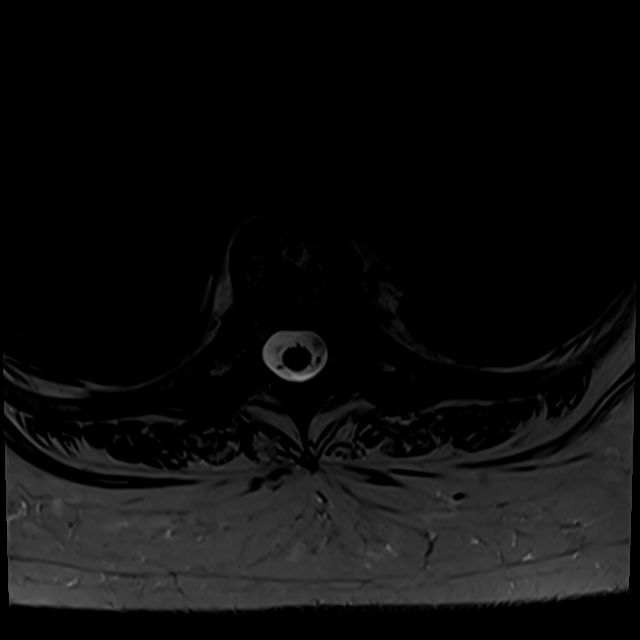
[im 27/54]
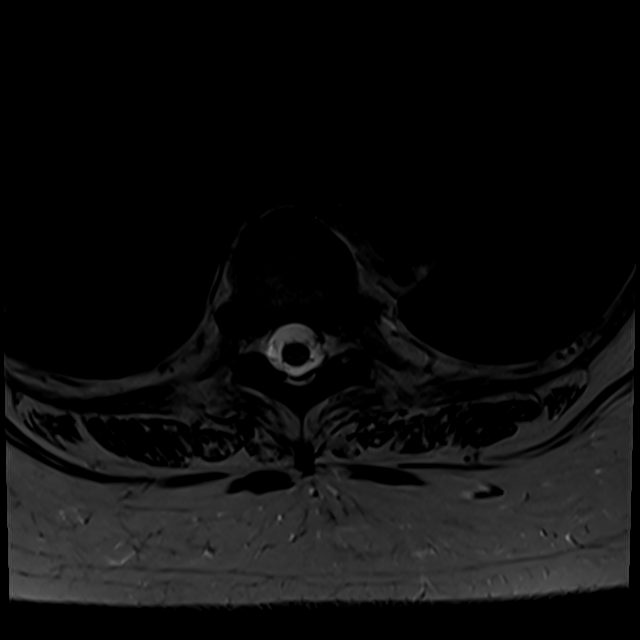
[im 31/54]
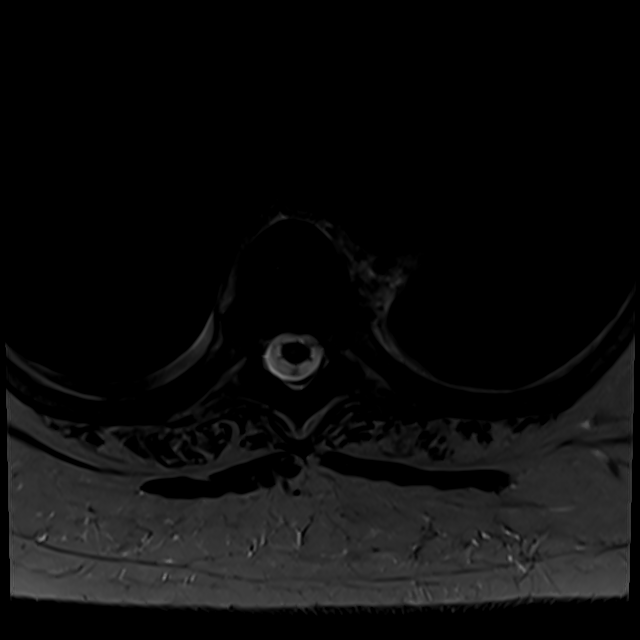
[im 38/54]
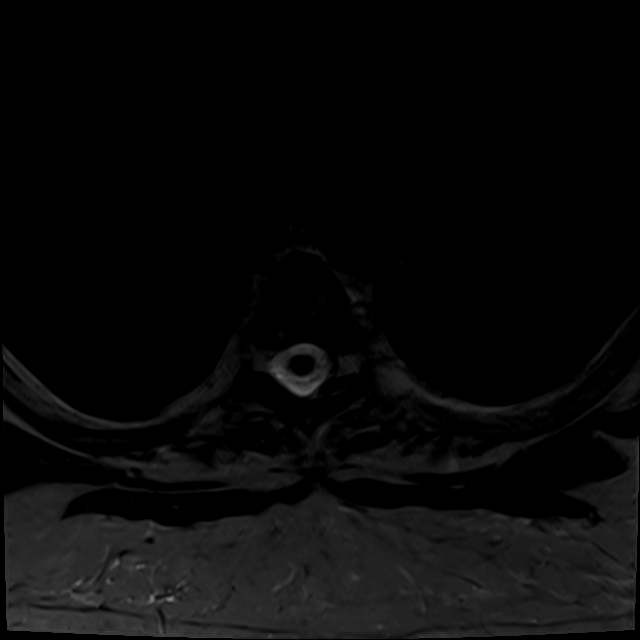
[im 46/54]
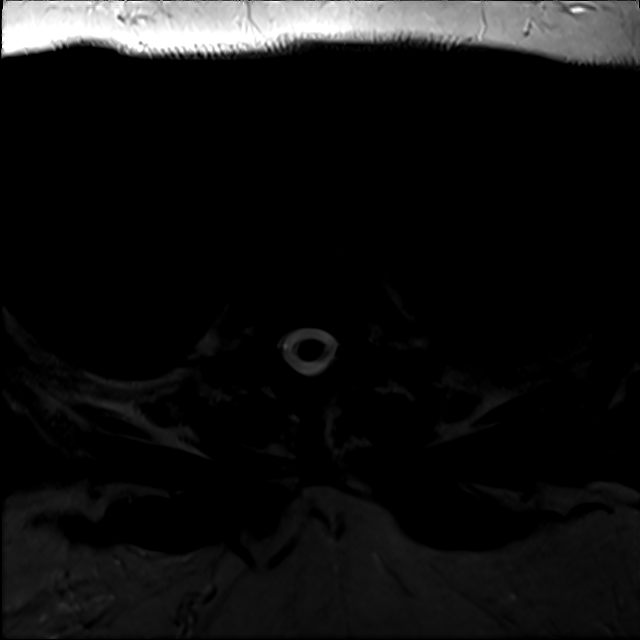

[19 of 48 positions shown; findings below may reference images not displayed]

FINDINGS: Alignment:  Trace anterolisthesis of T9 on T10.

Vertebrae: Chronic T3, T5, T7, T8, and T10 compression fractures are
unchanged from the prior MRI with prior vertebral augmentation again
noted at T7, T8, and T10. A T12 compression fracture is new from the
prior MRI but was present on the interval CT and demonstrates
unchanged height loss with at most minimal residual marrow edema.
There are T6 and T9 compression fractures which were present on the
prior MRI, however there is new STIR hyperintensity throughout both
vertebral bodies with slightly progressive height loss at both
levels most consistent with acute on chronic fractures. There is
also slight chronic anterior wedging of the T1 vertebral body,
however there is a new 1.7 cm region of rounded T1 hypointensity and
STIR hyperintensity in the posterior vertebral body predominantly to
the right of midline with slight bulging of the posterior vertebral
body cortex and no interval height loss compared to the prior MRI.

Cord:  Normal signal and morphology.

Paraspinal and other soft tissues: Partially visualized mild chronic
extrahepatic biliary dilatation, similar to the prior MRI.

Disc levels:

Small left paracentral disc protrusion at T5-6, a shallow central
disc protrusion at T6-7, a shallow central to right paracentral disc
protrusion at T7-8 are unchanged and do not result in significant
spinal cord mass effect or stenosis. Mild disc bulging and posterior
element hypertrophy result in mild spinal stenosis at T11-12 greater
than T10-11, new/progressed from the prior MRI with slight
retropulsion of the T12 superior endplate contributing at T11-12.
IMPRESSION: 1. Acute on chronic T6 and T9 compression fractures.
2. New abnormal signal in the T1 vertebral body. While this may also
reflect an insufficiency fracture, the rounded/partially masslike
appearance and lack of interval height loss raise the possibility of
neoplasm. A follow-up thoracic spine MRI is recommended in 2-3
months.
3. Multiple chronic thoracic compression fractures.
4. Mild spinal stenosis at T11-12 greater than T10-11.

## 2020-07-17 ENCOUNTER — Encounter: Payer: Self-pay | Admitting: Pulmonary Disease

## 2020-07-17 ENCOUNTER — Ambulatory Visit (INDEPENDENT_AMBULATORY_CARE_PROVIDER_SITE_OTHER): Payer: Medicare Other | Admitting: Pulmonary Disease

## 2020-07-17 ENCOUNTER — Other Ambulatory Visit: Payer: Self-pay

## 2020-07-17 VITALS — BP 132/64 | HR 83 | Temp 98.1°F | Ht 65.0 in | Wt 268.6 lb

## 2020-07-17 DIAGNOSIS — J439 Emphysema, unspecified: Secondary | ICD-10-CM | POA: Diagnosis not present

## 2020-07-17 DIAGNOSIS — G4733 Obstructive sleep apnea (adult) (pediatric): Secondary | ICD-10-CM

## 2020-07-17 DIAGNOSIS — J449 Chronic obstructive pulmonary disease, unspecified: Secondary | ICD-10-CM | POA: Diagnosis not present

## 2020-07-17 DIAGNOSIS — Z6841 Body Mass Index (BMI) 40.0 and over, adult: Secondary | ICD-10-CM

## 2020-07-17 DIAGNOSIS — J9611 Chronic respiratory failure with hypoxia: Secondary | ICD-10-CM

## 2020-07-17 DIAGNOSIS — J984 Other disorders of lung: Secondary | ICD-10-CM

## 2020-07-17 MED ORDER — ALBUTEROL SULFATE 0.63 MG/3ML IN NEBU: 0.6300 mg | INHALATION_SOLUTION | RESPIRATORY_TRACT | 11 refills | Status: AC | PRN

## 2020-07-17 MED ORDER — TIOTROPIUM BROMIDE-OLODATEROL 2.5-2.5 MCG/ACT IN AERS
2.0000 | INHALATION_SPRAY | Freq: Every day | RESPIRATORY_TRACT | 3 refills | Status: DC
Start: 2020-07-17 — End: 2021-08-06

## 2020-07-17 MED ORDER — ALBUTEROL SULFATE HFA 108 (90 BASE) MCG/ACT IN AERS: 2.0000 | INHALATION_SPRAY | Freq: Four times a day (QID) | RESPIRATORY_TRACT | 6 refills | Status: AC | PRN

## 2020-07-17 MED ORDER — STIOLTO RESPIMAT 2.5-2.5 MCG/ACT IN AERS
2.0000 | INHALATION_SPRAY | Freq: Every day | RESPIRATORY_TRACT | 0 refills | Status: DC
Start: 2020-07-17 — End: 2021-08-21

## 2020-07-17 NOTE — Progress Notes (Signed)
Synopsis: Referred in Dec 2019 for COPD exacerbation by [facility]  Subjective:   PATIENT ID: Theresa Fernandez GENDER: female DOB: 1956/01/25, MRN: [id number]  Chief Complaint  Patient presents with  . COPD    7 month rov.  C/o stable sob, fatigue, dyspnea with exertion.      PMH DMII, HTN, COPD. She quit smoking on 05/31/2018. Smoked from age 64 y ears to 27, at her max was smoking 3 packs per day. She ultimately went to the ER and was admitted to the hospital on 06/06/2018 for AECOPD and decompensated heart failure. She was seen by a pulmonologist at [address]. She was tried on several DPIs and each made her feel like she was going to vomit. She was treated with steroids and albuterol/ antibiotics while in the hospital. She was given diuretics and said she lost 20 lbs. She was given oxygen to use at home when she needed it.  She has not had any recent axial CT imaging of the chest.  She also has chronic pain due to thoracic compression fractures.  She is followed also at the [facility] for this.  She is taking chronic Percocet use.  She was diagnosed with obstructive sleep apnea greater than 10 years ago.  This was her last sleep study.  She does not remember where this was completed.  She was tried on several masks during that time.  She did not like the way that any of the masks felt.  She had very much trouble using a full facemask not having any dentures or full teeth.  She also had trouble with the nasal pillows rolling off her face.  During her hospitalization she was placed on BiPAP for some period of time.  She said she did very well while using BiPAP and would consider using it at home.  She still is exposed to significant secondhand smoke from her family members who live with her as well as her husband.    OV 08/24/2018: Patient seen today for follow-up.  She has multiple medical problems and recently seen in the emergency room back in December for  decompensated heart failure.  Former smoker quit back in November 2019.  PFTs completed in January revealed impaired spirometry with an FVC 61%, FEV1 64% ratio of 80 and moderate DLCO reduction.  Today in the office she complains of short of breath and dyspnea when she exerts herself.  She has a prior history of obstructive sleep apnea not on CPAP.  Patient denies chest pain hemoptysis.  She does not have a cardiologist.  Is interested in seeing 1.  I do think she has multiple medical risk factors for coronary disease.  And a known diagnosis of heart failure.  Last echocardiogram was in November 2019 which revealed a normal ejection fraction and grade 1 diastolic dysfunction.  OV 07/17/2020: Needs requalifed for O2 today for POC.  Currently not on any maintenance inhalers.  She is using albuterol only.  Still feels short of breath with exertion.  She barely gets out of her house.  Also wants to stay somewhat socially isolated if possible.  She is Covid vaccinated.  Her and her husband will occasionally go out but seldom.  She also feels like this limits her activity level.  Because she is at home all the time.  She also understands that she needs to continue to try to lose weight.   Past Medical History:  Diagnosis Date  . Allergy   .  Anxiety   . Arthritis   . CHF (congestive heart failure) ([place])   . COPD (chronic obstructive pulmonary disease) ([place])   . Depression   . Diabetes mellitus without complication ([place])   . HTN (hypertension) 01/12/2013   pt denies     Family History  Problem Relation Age of Onset  . Diabetes Mother   . Alcohol abuse Father   . Diabetes Father   . Stroke Brother      Past Surgical History:  Procedure Laterality Date  . MASTOIDECTOMY    . TONSILLECTOMY    . TUBAL LIGATION      Social History   Socioeconomic History  . Marital status: Married    Spouse name: Not on file  . Number of children: Not on file  . Years of education: Not on file  . Highest  education level: Not on file  Occupational History  . Not on file  Tobacco Use  . Smoking status: Former Smoker    Packs/day: 3.00    Years: 50.00    Pack years: 150.00    Types: Cigarettes    Quit date: 05/30/2018    Years since quitting: 65.0  . Smokeless tobacco: Never Used  Vaping Use  . Vaping Use: Never used  Substance and Sexual Activity  . Alcohol use: No  . Drug use: No  . Sexual activity: Not on file  Other Topics Concern  . Not on file  Social History Narrative  . Not on file   Social Determinants of Health   Financial Resource Strain: Not on file  Food Insecurity: Not on file  Transportation Needs: Not on file  Physical Activity: Not on file  Stress: Not on file  Social Connections: Not on file  Intimate Partner Violence: Not on file     Allergies  Allergen Reactions  . Ceftriaxone Hives and Rash    After second dose on 06/07/2018,  . Topamax [Topiramate] Other (See Comments)    Made headache worse  . Voltaren [Diclofenac Sodium] Other (See Comments)    Caused migraine     Outpatient Medications Prior to Visit  Medication Sig Dispense Refill  . albuterol (ACCUNEB) 0.63 MG/3ML nebulizer solution Take 0.63 mg by nebulization every 4 (four) hours as needed for wheezing or shortness of breath.     Marland Kitchen albuterol (VENTOLIN HFA) 108 (90 Base) MCG/ACT inhaler Inhale 2 puffs into the lungs every 6 (six) hours as needed for wheezing or shortness of breath (wheezing). 18 g 3  . ALPRAZolam (XANAX) 0.5 MG tablet Take 0.5 mg by mouth at bedtime.     Marland Kitchen amphetamine-dextroamphetamine (ADDERALL) 20 MG tablet Take 1 tablet (20 mg total) by mouth 3 (three) times daily. (Patient taking differently: Take 20 mg by mouth See admin instructions. Take one tablet (20 mg) by mouth every morning, may also take one tablet (20 mg) after lunch if needed for exhaustion) 90 tablet 0  . busPIRone (BUSPAR) 15 MG tablet Take 30 mg by mouth at bedtime.     . carisoprodol (SOMA) 250 MG tablet  Take 250 mg by mouth See admin instructions. Take one tablet (250 mg) by mouth twice daily - 7pm and midnight    . furosemide (LASIX) 40 MG tablet Take 1 tablet (40 mg total) by mouth 2 (two) times daily. (Patient taking differently: Take 40 mg by mouth daily as needed for fluid or edema.) 90 tablet 3  . HYDROcodone-acetaminophen (NORCO) 10-325 MG tablet Take 1 tablet by mouth every 6 (  six) hours as needed (pain).     . hydrOXYzine (ATARAX/VISTARIL) 25 MG tablet Take 25 mg by mouth at bedtime.     . lamoTRIgine (LAMICTAL) 25 MG tablet Take 25 mg by mouth at bedtime.     Marland Kitchen lisinopril (PRINIVIL,ZESTRIL) 2.5 MG tablet Take 2.5 mg by mouth daily. For kidney function    . Magnesium 500 MG TABS Take 500-1,000 mg by mouth See admin instructions. Take 1 tablet (500 mg) by mouth every morning and 2 tablets (1000 mg) at night    . metolazone (ZAROXOLYN) 2.5 MG tablet Take 1 tablet (2.5 mg total) by mouth every other day. As needed for weight gain of 2 lbs 45 tablet 3  . omeprazole (PRILOSEC) 40 MG capsule Take 40 mg by mouth daily.    . ondansetron (ZOFRAN) 8 MG tablet Take 1 tablet (8 mg total) by mouth every 8 (eight) hours as needed for nausea or vomiting. 20 tablet 0  . pioglitazone (ACTOS) 15 MG tablet Take 15 mg by mouth daily.    . Potassium 99 MG TABS Take 99 mg by mouth at bedtime.    . prazosin (MINIPRESS) 5 MG capsule Take 5 mg by mouth at bedtime.    . pregabalin (LYRICA) 150 MG capsule Take 1 capsule (150 mg total) by mouth at bedtime. (Patient taking differently: Take 100 mg by mouth 2 (two) times daily.) 30 capsule 3  . sertraline (ZOLOFT) 100 MG tablet Take 200 mg by mouth at bedtime.     . Vitamin D, Ergocalciferol, (DRISDOL) 1.25 MG (50000 UNIT) CAPS capsule Take 50,000 Units by mouth every Tuesday.    Marland Kitchen ibuprofen (ADVIL) 200 MG tablet Take 400 mg by mouth every 6 (six) hours as needed for headache (pain). (Patient not taking: Reported on 07/17/2020)     No facility-administered medications  prior to visit.    Review of Systems  Constitutional: Negative for chills, fever, malaise/fatigue and weight loss.  HENT: Negative for hearing loss, sore throat and tinnitus.   Eyes: Negative for blurred vision and double vision.  Respiratory: Positive for cough and shortness of breath. Negative for hemoptysis, sputum production, wheezing and stridor.   Cardiovascular: Negative for chest pain, palpitations, orthopnea, leg swelling and PND.  Gastrointestinal: Negative for abdominal pain, constipation, diarrhea, heartburn, nausea and vomiting.  Genitourinary: Negative for dysuria, hematuria and urgency.  Musculoskeletal: Negative for joint pain and myalgias.  Skin: Negative for itching and rash.  Neurological: Negative for dizziness, tingling, weakness and headaches.  Endo/Heme/Allergies: Negative for environmental allergies. Does not bruise/bleed easily.  Psychiatric/Behavioral: Negative for depression. The patient is not nervous/anxious and does not have insomnia.   All other systems reviewed and are negative.    Objective:  Physical Exam Vitals reviewed.  Constitutional:      General: She is not in acute distress.    Appearance: She is well-developed and well-nourished. She is obese.  HENT:     Head: Normocephalic and atraumatic.     Mouth/Throat:     Mouth: Oropharynx is clear and moist.  Eyes:     General: No scleral icterus.    Conjunctiva/sclera: Conjunctivae normal.     Pupils: Pupils are equal, round, and reactive to light.  Neck:     Vascular: No JVD.     Trachea: No tracheal deviation.  Cardiovascular:     Rate and Rhythm: Normal rate and regular rhythm.     Pulses: Intact distal pulses.     Heart sounds: Normal heart sounds. No  murmur heard.   Pulmonary:     Effort: Pulmonary effort is normal. No tachypnea, accessory muscle usage or respiratory distress.     Breath sounds: Normal breath sounds. No stridor. No wheezing, rhonchi or rales.  Abdominal:      General: Bowel sounds are normal. There is no distension.     Palpations: Abdomen is soft.     Tenderness: There is no abdominal tenderness.  Musculoskeletal:        General: No tenderness or edema.     Cervical back: Neck supple.  Lymphadenopathy:     Cervical: No cervical adenopathy.  Skin:    General: Skin is warm and dry.     Capillary Refill: Capillary refill takes less than 2 seconds.     Findings: No rash.  Neurological:     Mental Status: She is alert and oriented to person, place, and time.  Psychiatric:        Mood and Affect: Mood and affect normal.        Behavior: Behavior normal.      Vitals:   07/17/20 1332  BP: 132/64  Pulse: 83  Temp: 98.1 F (36.7 C)  TempSrc: Temporal  SpO2: 92%  Weight: 268 lb 9.6 oz (121.8 kg)  Height: 5\' 5"  (1.651 m)   92% on RA BMI Readings from Last 3 Encounters:  07/17/20 44.70 kg/m  04/25/20 42.12 kg/m  11/23/19 45.40 kg/m   Wt Readings from Last 3 Encounters:  07/17/20 268 lb 9.6 oz (121.8 kg)  04/25/20 277 lb (125.6 kg)  11/23/19 272 lb 12.8 oz (123.7 kg)     CBC    Component Value Date/Time   WBC 12.3 (H) 01/01/2020 1039   RBC 4.13 01/01/2020 1039   HGB 12.1 01/01/2020 1039   HCT 39.3 01/01/2020 1039   PLT 301 01/01/2020 1039   MCV 95.2 01/01/2020 1039   MCH 29.3 01/01/2020 1039   MCHC 30.8 01/01/2020 1039   RDW 13.2 01/01/2020 1039   LYMPHSABS 1.8 06/06/2018 1458   MONOABS 0.6 06/06/2018 1458   EOSABS 0.3 06/06/2018 1458   BASOSABS 0.1 06/06/2018 1458    Chest Imaging: 06/06/2018 chest x-ray: Bilateral vascular congestion 06/12/2018 chest x-ray: Bilateral interstitial prominence somewhat improved when compared to previous x-ray  08/04/2018 HRCT: Patchy centrilobular groundglass nodules and longstanding history of smoking likely imaging consistent with respiratory bronchiolitis with ILD.  Not consistent with UIP. The patient's images have been independently reviewed by me.    Pulmonary Functions  Testing Results: PFT Results Latest Ref Rng & Units 07/20/2018  FVC-Pre L 1.78  FVC-Predicted Pre % 58  FVC-Post L 1.87  FVC-Predicted Post % 61  Pre FEV1/FVC % % 77  Post FEV1/FCV % % 80  FEV1-Pre L 1.38  FEV1-Predicted Pre % 58  FEV1-Post L 1.50  DLCO uncorrected ml/min/mmHg 12.34  DLCO UNC% % 55  DLCO corrected ml/min/mmHg 13.13  DLCO COR %Predicted % 59  DLVA Predicted % 92  TLC L 3.85  TLC % Predicted % 79  RV % Predicted % 96    FeNO: None   Pathology: None   Echocardiogram: 06/08/2018 - Preserved EF, Grade 1 DD.   Heart Catheterization: None     Assessment & Plan:    Chronic obstructive pulmonary disease, unspecified COPD type (HCC)  Chronic hypoxemic respiratory failure (HCC)  Mixed restrictive and obstructive lung disease (HCC)  Body mass index 45.0-49.9, adult (HCC)  OSA (obstructive sleep apnea)  Discussion:  64 year old female, longstanding history of smoking,  quit in 2019, mixed restrictive and obstructive defect, morbidly obese, likely has moderate COPD.  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  She thankfully has quit smoking at this time.  She has not followed up with sleep clinic.  She did have a split-night study ordered but this is yet to be completed.  She did not want to wear a mask to sleep if was necessary.  As for her COPD inhaler regimen she is currently not taking any medications.  She never got additional meds refilled and she did not like the dry powder inhalers to begin with.  Plan: Samples of Stiolto inhaler New prescription for Stiolto Albuterol neb refills Albuterol inhaler refills. Continue enrollment in lung cancer screening CT program to be scheduled February 2022.    Current Outpatient Medications:  .  albuterol (ACCUNEB) 0.63 MG/3ML nebulizer solution, Take 0.63 mg by nebulization every 4 (four) hours as needed for wheezing or shortness of breath. , Disp: , Rfl:  .  albuterol (VENTOLIN HFA) 108 (90 Base) MCG/ACT  inhaler, Inhale 2 puffs into the lungs every 6 (six) hours as needed for wheezing or shortness of breath (wheezing)., Disp: 18 g, Rfl: 3 .  ALPRAZolam (XANAX) 0.5 MG tablet, Take 0.5 mg by mouth at bedtime. , Disp: , Rfl:  .  amphetamine-dextroamphetamine (ADDERALL) 20 MG tablet, Take 1 tablet (20 mg total) by mouth 3 (three) times daily. (Patient taking differently: Take 20 mg by mouth See admin instructions. Take one tablet (20 mg) by mouth every morning, may also take one tablet (20 mg) after lunch if needed for exhaustion), Disp: 90 tablet, Rfl: 0 .  busPIRone (BUSPAR) 15 MG tablet, Take 30 mg by mouth at bedtime. , Disp: , Rfl:  .  carisoprodol (SOMA) 250 MG tablet, Take 250 mg by mouth See admin instructions. Take one tablet (250 mg) by mouth twice daily - 7pm and midnight, Disp: , Rfl:  .  furosemide (LASIX) 40 MG tablet, Take 1 tablet (40 mg total) by mouth 2 (two) times daily. (Patient taking differently: Take 40 mg by mouth daily as needed for fluid or edema.), Disp: 90 tablet, Rfl: 3 .  HYDROcodone-acetaminophen (NORCO) 10-325 MG tablet, Take 1 tablet by mouth every 6 (six) hours as needed (pain). , Disp: , Rfl:  .  hydrOXYzine (ATARAX/VISTARIL) 25 MG tablet, Take 25 mg by mouth at bedtime. , Disp: , Rfl:  .  lamoTRIgine (LAMICTAL) 25 MG tablet, Take 25 mg by mouth at bedtime. , Disp: , Rfl:  .  lisinopril (PRINIVIL,ZESTRIL) 2.5 MG tablet, Take 2.5 mg by mouth daily. For kidney function, Disp: , Rfl:  .  Magnesium 500 MG TABS, Take 500-1,000 mg by mouth See admin instructions. Take 1 tablet (500 mg) by mouth every morning and 2 tablets (1000 mg) at night, Disp: , Rfl:  .  metolazone (ZAROXOLYN) 2.5 MG tablet, Take 1 tablet (2.5 mg total) by mouth every other day. As needed for weight gain of 2 lbs, Disp: 45 tablet, Rfl: 3 .  omeprazole (PRILOSEC) 40 MG capsule, Take 40 mg by mouth daily., Disp: , Rfl:  .  ondansetron (ZOFRAN) 8 MG tablet, Take 1 tablet (8 mg total) by mouth every 8 (eight)  hours as needed for nausea or vomiting., Disp: 20 tablet, Rfl: 0 .  pioglitazone (ACTOS) 15 MG tablet, Take 15 mg by mouth daily., Disp: , Rfl:  .  Potassium 99 MG TABS, Take 99 mg by mouth at bedtime., Disp: , Rfl:  .  prazosin (MINIPRESS) 5 MG capsule, Take 5 mg by mouth at bedtime., Disp: , Rfl:  .  pregabalin (LYRICA) 150 MG capsule, Take 1 capsule (150 mg total) by mouth at bedtime. (Patient taking differently: Take 100 mg by mouth 2 (two) times daily.), Disp: 30 capsule, Rfl: 3 .  sertraline (ZOLOFT) 100 MG tablet, Take 200 mg by mouth at bedtime. , Disp: , Rfl:  .  Vitamin D, Ergocalciferol, (DRISDOL) 1.25 MG (50000 UNIT) CAPS capsule, Take 50,000 Units by mouth every Tuesday., Disp: , Rfl:    Garner Nash, DO [facility] 07/17/2020 1:38 PM

## 2020-07-17 NOTE — Addendum Note (Signed)
Addended by: Len Blalock on: 07/17/2020 02:03 PM   Modules accepted: Orders

## 2020-07-17 NOTE — Patient Instructions (Signed)
Thank you for visiting Dr. Valeta Harms at [facility]. Today we recommend the following:  LDCT for lung cancer screening in Feb 2022  Meds ordered this encounter  Medications   albuterol (ACCUNEB) 0.63 MG/3ML nebulizer solution    Sig: Take 3 mLs (0.63 mg total) by nebulization every 4 (four) hours as needed for wheezing or shortness of breath.    Dispense:  75 mL    Refill:  11   albuterol (VENTOLIN HFA) 108 (90 Base) MCG/ACT inhaler    Sig: Inhale 2 puffs into the lungs every 6 (six) hours as needed for wheezing or shortness of breath (wheezing).    Dispense:  18 g    Refill:  6   Samples of Stiolto today and new prescription for stiolto.   Return in about 1 year (around 07/17/2021), or if symptoms worsen or fail to improve, for with APP or Dr. Valeta Harms.    Please do your part to reduce the spread of COVID-19.

## 2020-07-18 ENCOUNTER — Telehealth: Payer: Self-pay | Admitting: Pulmonary Disease

## 2020-07-18 DIAGNOSIS — J9611 Chronic respiratory failure with hypoxia: Secondary | ICD-10-CM

## 2020-07-18 NOTE — Telephone Encounter (Signed)
Pt calling b/c mail order pharmacy called to let pt know there was an issue with medication-nebulizer solution- manufacturer's recommended dosage and what BI wrote for.Sent back high priority d/t pharmacy saying if they didn't get a response within 24 hours, they'd cancel Please [phone] invoice # [id number]

## 2020-07-18 NOTE — Telephone Encounter (Signed)
Per chart, no order was placed.  Pt had told me she needed a qualifying walk to keep her current machine, not that she was requesting a POC.   She was walked on 3lpm pulse dose so this can be ordered.    Order placed. Nothing further needed at this time- will close encounter.

## 2020-07-18 NOTE — Telephone Encounter (Signed)
Called [facility] in regards to pt's albuterol neb sol that was sent to pharmacy by Dr. Tonia Brooms 07/17/20.  Spoke with Darral Dash, pharmacist with [facility] and clarified pt's Rx for albuterol sol. Darral Dash stated that they would get Rx ready for pt.  Called and spoke with pt letting her know this info and she verbalized understanding.  While speaking with pt, she wanted to know if an order had been placed at [facility] yesterday 12/27 for her to receive an [employer] POC from [facility]. Pt was walked at office by Morrie Sheldon. Morrie Sheldon, please advise if order was placed for pt to receive new [employer] POC from [facility].

## 2020-07-18 NOTE — Telephone Encounter (Signed)
Called pt.  Order was just placed today by Dr. Tonia Brooms.  Awaiting Dr. Myrlene Broker signature.  Pt verbalized understanding.

## 2020-07-29 ENCOUNTER — Other Ambulatory Visit: Payer: Self-pay

## 2020-07-29 ENCOUNTER — Emergency Department (HOSPITAL_COMMUNITY): Payer: Medicare Other

## 2020-07-29 ENCOUNTER — Inpatient Hospital Stay (HOSPITAL_COMMUNITY)
Admission: EM | Admit: 2020-07-29 | Discharge: 2020-08-01 | DRG: 377 | Disposition: A | Discharge status: Home / Self Care | Payer: Medicare Other | Attending: Internal Medicine | Admitting: Internal Medicine

## 2020-07-29 DIAGNOSIS — K254 Chronic or unspecified gastric ulcer with hemorrhage: Principal | ICD-10-CM | POA: Diagnosis present

## 2020-07-29 DIAGNOSIS — Z833 Family history of diabetes mellitus: Secondary | ICD-10-CM

## 2020-07-29 DIAGNOSIS — I5032 Chronic diastolic (congestive) heart failure: Secondary | ICD-10-CM | POA: Diagnosis present

## 2020-07-29 DIAGNOSIS — Z888 Allergy status to other drugs, medicaments and biological substances status: Secondary | ICD-10-CM

## 2020-07-29 DIAGNOSIS — E1122 Type 2 diabetes mellitus with diabetic chronic kidney disease: Secondary | ICD-10-CM | POA: Diagnosis not present

## 2020-07-29 DIAGNOSIS — Z9981 Dependence on supplemental oxygen: Secondary | ICD-10-CM

## 2020-07-29 DIAGNOSIS — Z881 Allergy status to other antibiotic agents status: Secondary | ICD-10-CM

## 2020-07-29 DIAGNOSIS — I130 Hypertensive heart and chronic kidney disease with heart failure and stage 1 through stage 4 chronic kidney disease, or unspecified chronic kidney disease: Secondary | ICD-10-CM | POA: Diagnosis not present

## 2020-07-29 DIAGNOSIS — Z9119 Patient's noncompliance with other medical treatment and regimen: Secondary | ICD-10-CM | POA: Diagnosis not present

## 2020-07-29 DIAGNOSIS — Z6841 Body Mass Index (BMI) 40.0 and over, adult: Secondary | ICD-10-CM

## 2020-07-29 DIAGNOSIS — M797 Fibromyalgia: Secondary | ICD-10-CM | POA: Diagnosis present

## 2020-07-29 DIAGNOSIS — I5033 Acute on chronic diastolic (congestive) heart failure: Secondary | ICD-10-CM | POA: Diagnosis present

## 2020-07-29 DIAGNOSIS — G8929 Other chronic pain: Secondary | ICD-10-CM | POA: Diagnosis present

## 2020-07-29 DIAGNOSIS — Z87891 Personal history of nicotine dependence: Secondary | ICD-10-CM | POA: Diagnosis not present

## 2020-07-29 DIAGNOSIS — N1831 Chronic kidney disease, stage 3a: Secondary | ICD-10-CM | POA: Diagnosis not present

## 2020-07-29 DIAGNOSIS — J9621 Acute and chronic respiratory failure with hypoxia: Secondary | ICD-10-CM | POA: Diagnosis not present

## 2020-07-29 DIAGNOSIS — R0602 Shortness of breath: Secondary | ICD-10-CM | POA: Diagnosis present

## 2020-07-29 DIAGNOSIS — Z20822 Contact with and (suspected) exposure to covid-19: Secondary | ICD-10-CM | POA: Diagnosis present

## 2020-07-29 DIAGNOSIS — K921 Melena: Secondary | ICD-10-CM

## 2020-07-29 DIAGNOSIS — J984 Other disorders of lung: Secondary | ICD-10-CM | POA: Diagnosis not present

## 2020-07-29 DIAGNOSIS — K2970 Gastritis, unspecified, without bleeding: Secondary | ICD-10-CM | POA: Diagnosis not present

## 2020-07-29 DIAGNOSIS — K259 Gastric ulcer, unspecified as acute or chronic, without hemorrhage or perforation: Secondary | ICD-10-CM | POA: Diagnosis not present

## 2020-07-29 DIAGNOSIS — J439 Emphysema, unspecified: Secondary | ICD-10-CM

## 2020-07-29 DIAGNOSIS — Z9114 Patient's other noncompliance with medication regimen: Secondary | ICD-10-CM | POA: Diagnosis not present

## 2020-07-29 DIAGNOSIS — D631 Anemia in chronic kidney disease: Secondary | ICD-10-CM | POA: Diagnosis not present

## 2020-07-29 DIAGNOSIS — Z7951 Long term (current) use of inhaled steroids: Secondary | ICD-10-CM

## 2020-07-29 DIAGNOSIS — I071 Rheumatic tricuspid insufficiency: Secondary | ICD-10-CM | POA: Diagnosis present

## 2020-07-29 DIAGNOSIS — I10 Essential (primary) hypertension: Secondary | ICD-10-CM | POA: Diagnosis not present

## 2020-07-29 DIAGNOSIS — E785 Hyperlipidemia, unspecified: Secondary | ICD-10-CM | POA: Diagnosis not present

## 2020-07-29 DIAGNOSIS — R197 Diarrhea, unspecified: Secondary | ICD-10-CM | POA: Diagnosis present

## 2020-07-29 DIAGNOSIS — F32A Depression, unspecified: Secondary | ICD-10-CM | POA: Diagnosis present

## 2020-07-29 DIAGNOSIS — N183 Chronic kidney disease, stage 3 unspecified: Secondary | ICD-10-CM | POA: Diagnosis present

## 2020-07-29 DIAGNOSIS — J849 Interstitial pulmonary disease, unspecified: Secondary | ICD-10-CM | POA: Diagnosis not present

## 2020-07-29 DIAGNOSIS — J449 Chronic obstructive pulmonary disease, unspecified: Secondary | ICD-10-CM | POA: Diagnosis present

## 2020-07-29 DIAGNOSIS — G4733 Obstructive sleep apnea (adult) (pediatric): Secondary | ICD-10-CM | POA: Diagnosis not present

## 2020-07-29 DIAGNOSIS — Z79899 Other long term (current) drug therapy: Secondary | ICD-10-CM

## 2020-07-29 DIAGNOSIS — E875 Hyperkalemia: Secondary | ICD-10-CM | POA: Diagnosis not present

## 2020-07-29 DIAGNOSIS — E119 Type 2 diabetes mellitus without complications: Secondary | ICD-10-CM

## 2020-07-29 DIAGNOSIS — I5031 Acute diastolic (congestive) heart failure: Secondary | ICD-10-CM | POA: Diagnosis not present

## 2020-07-29 DIAGNOSIS — I509 Heart failure, unspecified: Secondary | ICD-10-CM

## 2020-07-29 LAB — I-STAT VENOUS BLOOD GAS, ED
Acid-Base Excess: 7 mmol/L — ABNORMAL HIGH (ref 0.0–2.0)
Bicarbonate: 31.3 mmol/L — ABNORMAL HIGH (ref 20.0–28.0)
Calcium, Ion: 1.13 mmol/L — ABNORMAL LOW (ref 1.15–1.40)
HCT: 33 % — ABNORMAL LOW (ref 36.0–46.0)
Hemoglobin: 11.2 g/dL — ABNORMAL LOW (ref 12.0–15.0)
O2 Saturation: 65 %
Potassium: 5.2 mmol/L — ABNORMAL HIGH (ref 3.5–5.1)
Sodium: 144 mmol/L (ref 135–145)
TCO2: 33 mmol/L — ABNORMAL HIGH (ref 22–32)
pCO2, Ven: 44.5 mmHg (ref 44.0–60.0)
pH, Ven: 7.456 — ABNORMAL HIGH (ref 7.250–7.430)
pO2, Ven: 32 mmHg (ref 32.0–45.0)

## 2020-07-29 LAB — RESP PANEL BY RT-PCR (FLU A&B, COVID) ARPGX2
Influenza A by PCR: NEGATIVE
Influenza B by PCR: NEGATIVE
SARS Coronavirus 2 by RT PCR: NEGATIVE

## 2020-07-29 LAB — CBC
HCT: 32.7 % — ABNORMAL LOW (ref 36.0–46.0)
Hemoglobin: 10 g/dL — ABNORMAL LOW (ref 12.0–15.0)
MCH: 29.9 pg (ref 26.0–34.0)
MCHC: 30.6 g/dL (ref 30.0–36.0)
MCV: 97.9 fL (ref 80.0–100.0)
Platelets: 261 10*3/uL (ref 150–400)
RBC: 3.34 MIL/uL — ABNORMAL LOW (ref 3.87–5.11)
RDW: 15.4 % (ref 11.5–15.5)
WBC: 8 10*3/uL (ref 4.0–10.5)
nRBC: 0 % (ref 0.0–0.2)

## 2020-07-29 LAB — CBG MONITORING, ED: Glucose-Capillary: 92 mg/dL (ref 70–99)

## 2020-07-29 LAB — BASIC METABOLIC PANEL
Anion gap: 12 (ref 5–15)
BUN: 11 mg/dL (ref 8–23)
CO2: 26 mmol/L (ref 22–32)
Calcium: 9 mg/dL (ref 8.9–10.3)
Chloride: 105 mmol/L (ref 98–111)
Creatinine, Ser: 1.02 mg/dL — ABNORMAL HIGH (ref 0.44–1.00)
GFR, Estimated: >60 mL/min (ref >60)
Glucose, Bld: 119 mg/dL — ABNORMAL HIGH (ref 70–99)
Potassium: 4.3 mmol/L (ref 3.5–5.1)
Sodium: 143 mmol/L (ref 135–145)

## 2020-07-29 LAB — TROPONIN I (HIGH SENSITIVITY)
Troponin I (High Sensitivity): 8 ng/L (ref <18)
Troponin I (High Sensitivity): 8 ng/L (ref <18)

## 2020-07-29 LAB — HEMOGLOBIN A1C
Hgb A1c MFr Bld: 5.6 % (ref 4.8–5.6)
Mean Plasma Glucose: 114.02 mg/dL

## 2020-07-29 LAB — BRAIN NATRIURETIC PEPTIDE: B Natriuretic Peptide: 158.2 pg/mL — ABNORMAL HIGH (ref 0.0–100.0)

## 2020-07-29 IMAGING — CR DG CHEST 2V
2 series · 2 of 2 positions shown · non-contrast
Comparison: None

CLINICAL DATA: CHF exacerbation.  Pain.

EXAM:
CHEST - 2 VIEW

[chest lat]
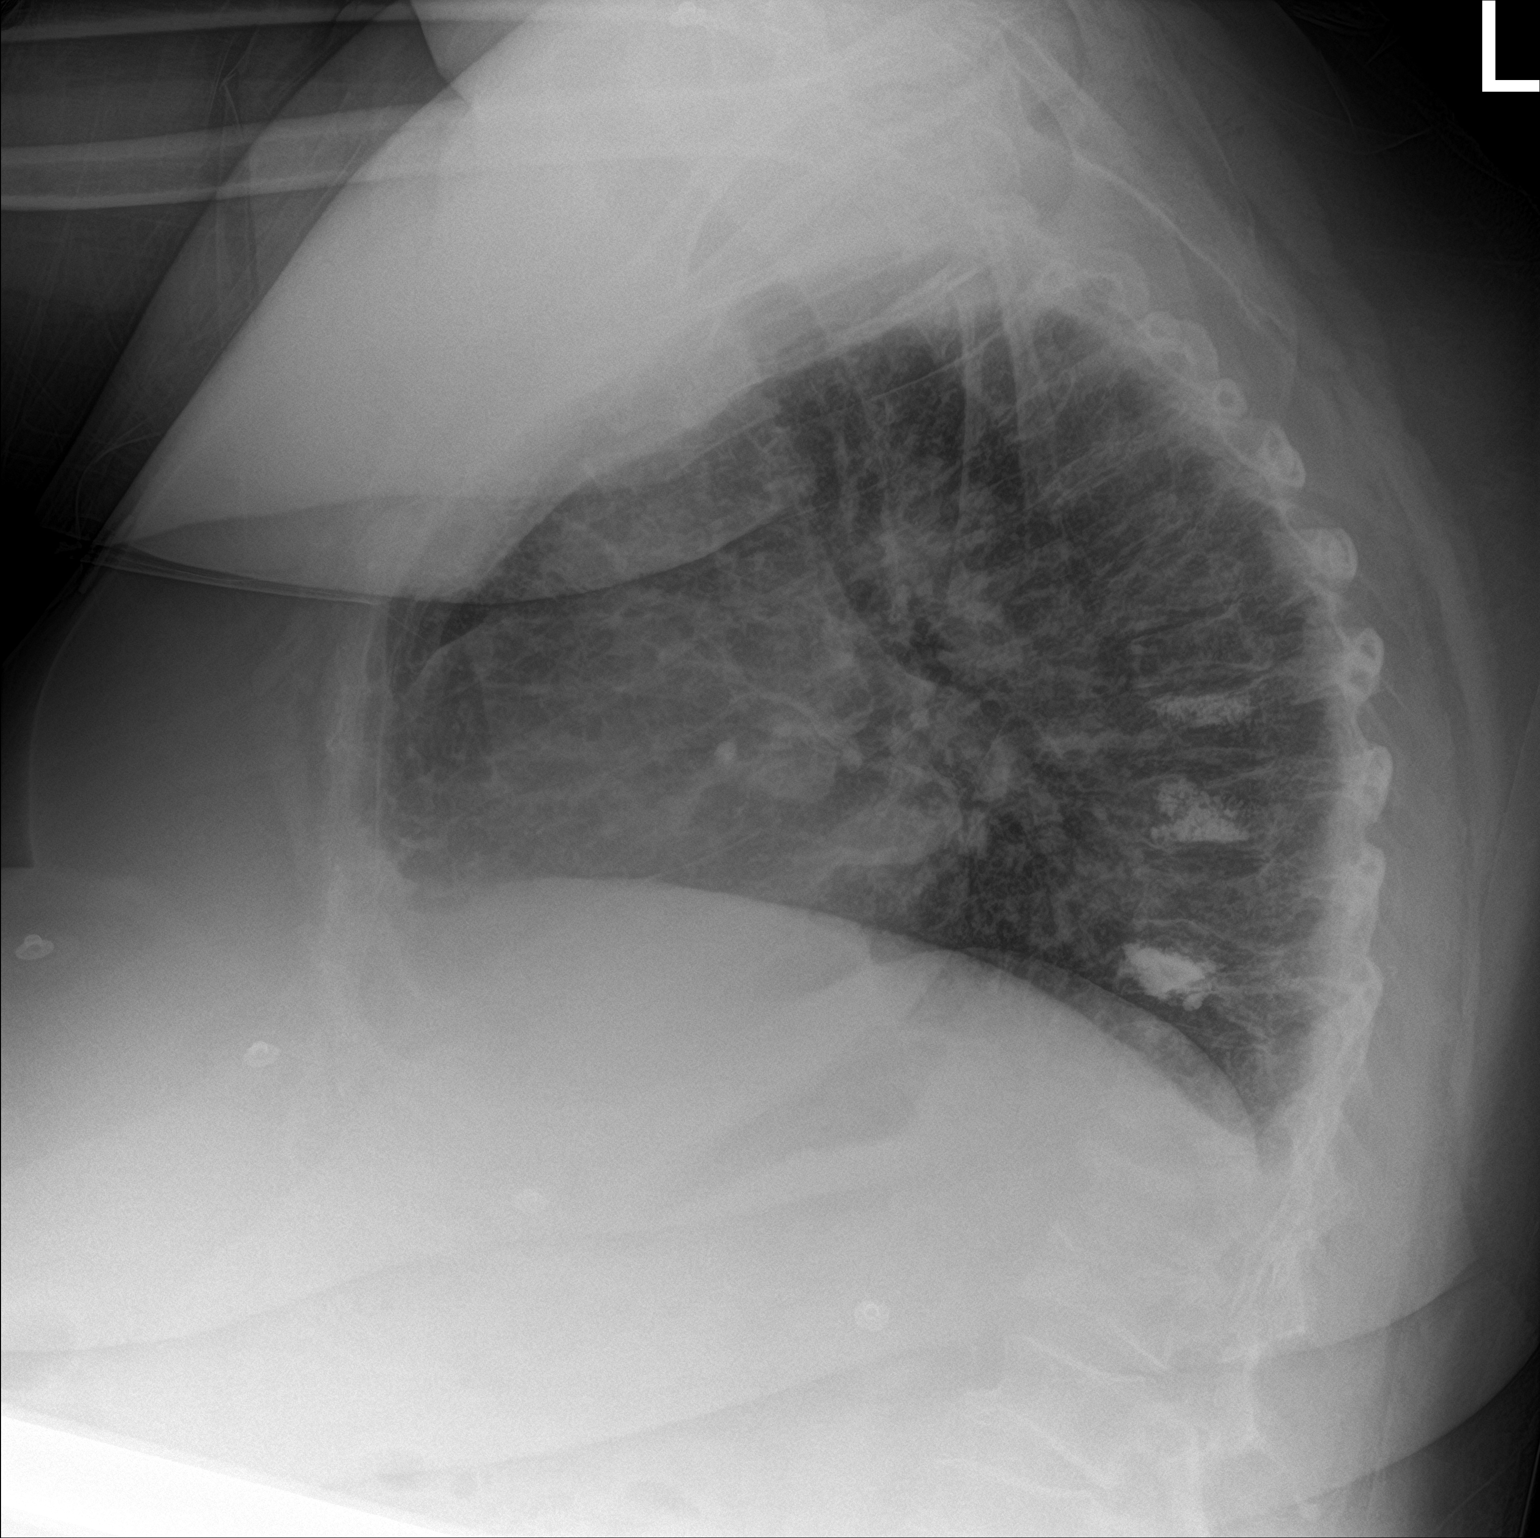

[chest ap]
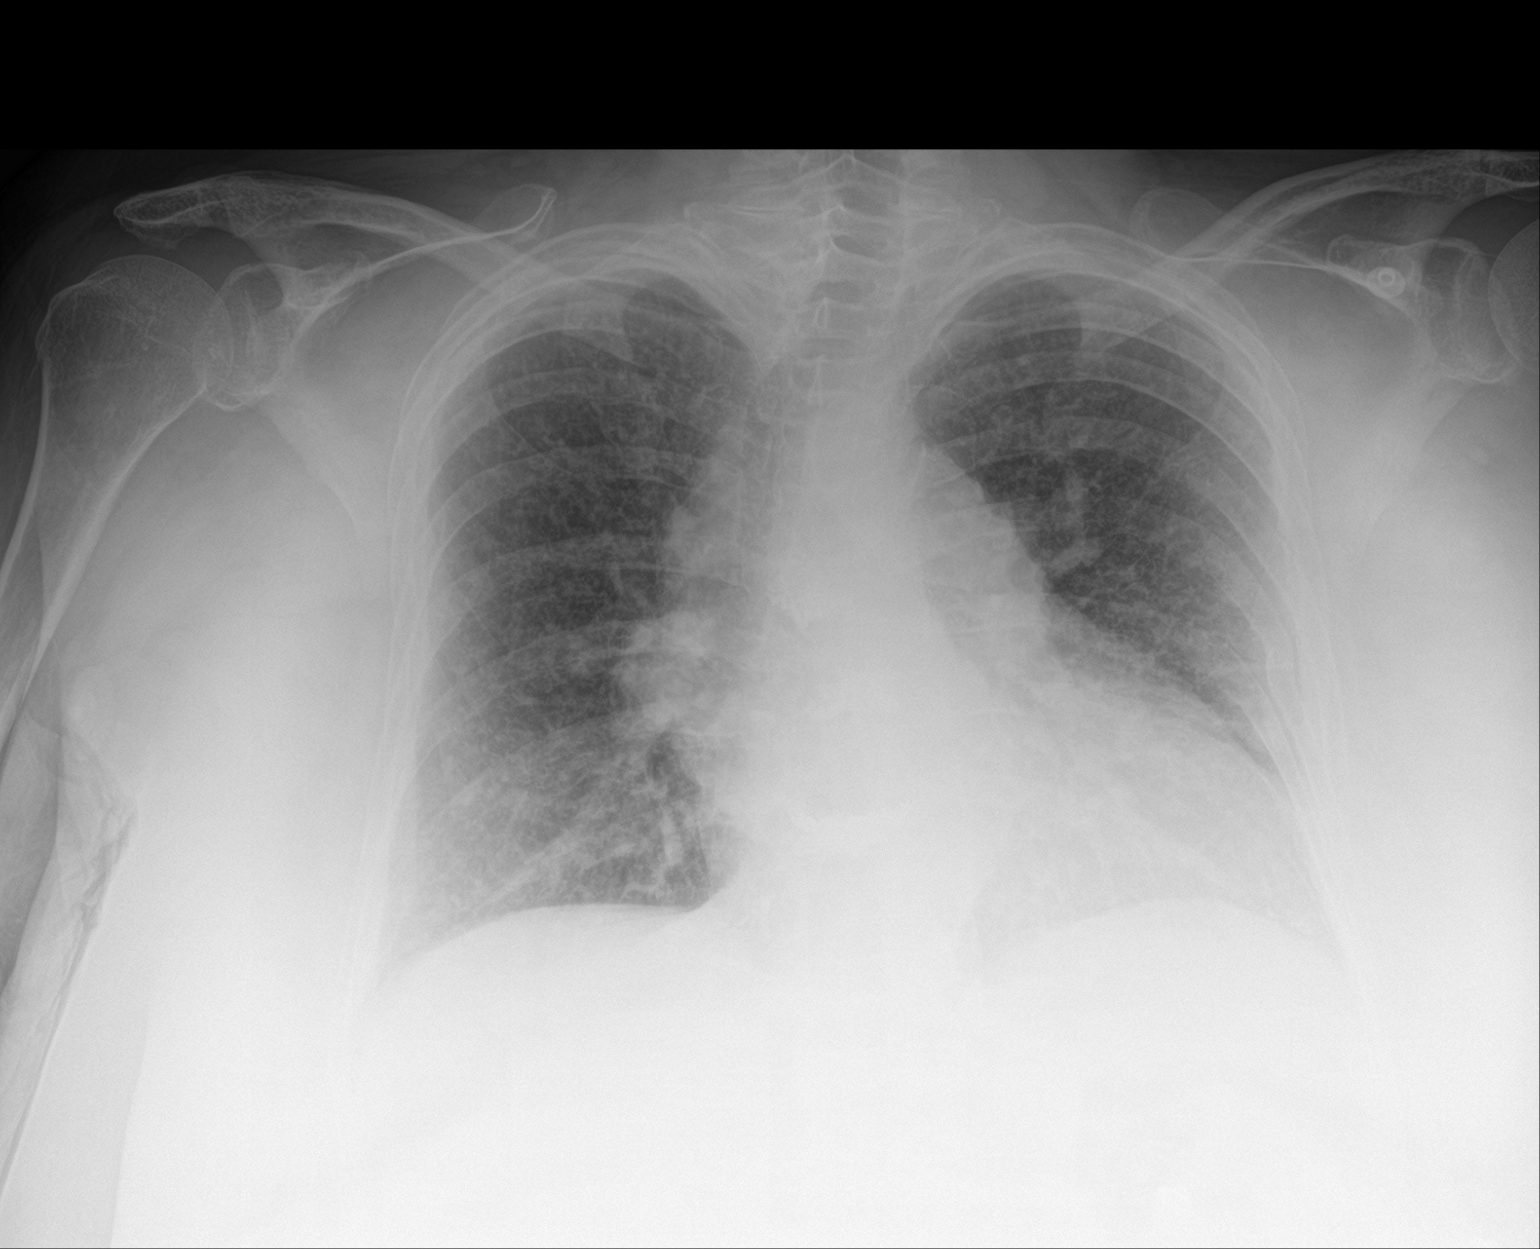

[2 of 2 positions shown; findings below may reference images not displayed]

FINDINGS: Cardiomegaly and pulmonary venous congestion/mild edema. No
pneumothorax. No nodules, masses, or focal pulmonary infiltrates.
The hila and mediastinum are normal. Previous kyphoplasties at
multiple levels in the thoracic spine.
IMPRESSION: Cardiomegaly and mild pulmonary venous congestion/mild edema.

## 2020-07-29 MED ORDER — INSULIN ASPART 100 UNIT/ML ~~LOC~~ SOLN: 0.0000 [IU] | Freq: Every day | SUBCUTANEOUS | Status: DC

## 2020-07-29 MED ORDER — BUSPIRONE HCL 10 MG PO TABS
30.0000 mg | ORAL_TABLET | Freq: Every day | ORAL | Status: DC
  Administered 2020-07-29 – 2020-07-31 (×3): 30 mg via ORAL
  Filled 2020-07-29 (×3): qty 3

## 2020-07-29 MED ORDER — HYDROCODONE-ACETAMINOPHEN 5-325 MG PO TABS: 1.0000 | ORAL_TABLET | ORAL | Status: DC | PRN

## 2020-07-29 MED ORDER — INSULIN ASPART 100 UNIT/ML ~~LOC~~ SOLN: 0.0000 [IU] | Freq: Three times a day (TID) | SUBCUTANEOUS | Status: DC

## 2020-07-29 MED ORDER — FUROSEMIDE 10 MG/ML IJ SOLN
40.0000 mg | Freq: Once | INTRAMUSCULAR | Status: AC
  Administered 2020-07-29: 40 mg via INTRAVENOUS
  Filled 2020-07-29: qty 4

## 2020-07-29 MED ORDER — ARFORMOTEROL TARTRATE 15 MCG/2ML IN NEBU
15.0000 ug | INHALATION_SOLUTION | Freq: Two times a day (BID) | RESPIRATORY_TRACT | Status: DC
  Administered 2020-07-29 – 2020-07-31 (×3): 15 ug via RESPIRATORY_TRACT
  Filled 2020-07-29 (×7): qty 2

## 2020-07-29 MED ORDER — PRAVASTATIN SODIUM 40 MG PO TABS
80.0000 mg | ORAL_TABLET | Freq: Every day | ORAL | Status: DC
  Administered 2020-07-30 – 2020-07-31 (×2): 80 mg via ORAL
  Filled 2020-07-29 (×2): qty 2

## 2020-07-29 MED ORDER — ALPRAZOLAM 0.5 MG PO TABS
0.5000 mg | ORAL_TABLET | Freq: Every day | ORAL | Status: DC
  Administered 2020-07-29 – 2020-07-31 (×3): 0.5 mg via ORAL
  Filled 2020-07-29: qty 2
  Filled 2020-07-29 (×2): qty 1

## 2020-07-29 MED ORDER — LAMOTRIGINE 25 MG PO TABS
25.0000 mg | ORAL_TABLET | Freq: Every day | ORAL | Status: DC
  Administered 2020-07-29 – 2020-07-31 (×3): 25 mg via ORAL
  Filled 2020-07-29 (×4): qty 1

## 2020-07-29 MED ORDER — FUROSEMIDE 10 MG/ML IJ SOLN
40.0000 mg | Freq: Two times a day (BID) | INTRAMUSCULAR | Status: DC
  Administered 2020-07-30 – 2020-08-01 (×5): 40 mg via INTRAVENOUS
  Filled 2020-07-29 (×6): qty 4

## 2020-07-29 MED ORDER — PANTOPRAZOLE SODIUM 40 MG PO TBEC
40.0000 mg | DELAYED_RELEASE_TABLET | Freq: Every day | ORAL | Status: DC
  Administered 2020-07-29 – 2020-07-30 (×2): 40 mg via ORAL
  Filled 2020-07-29 (×2): qty 1

## 2020-07-29 MED ORDER — SODIUM CHLORIDE 0.9% FLUSH: 3.0000 mL | INTRAVENOUS | Status: DC | PRN

## 2020-07-29 MED ORDER — SODIUM CHLORIDE 0.9 % IV SOLN: 250.0000 mL | INTRAVENOUS | Status: DC | PRN

## 2020-07-29 MED ORDER — ENOXAPARIN SODIUM 30 MG/0.3ML ~~LOC~~ SOLN
30.0000 mg | SUBCUTANEOUS | Status: DC
  Administered 2020-07-29: 30 mg via SUBCUTANEOUS
  Filled 2020-07-29: qty 0.3

## 2020-07-29 MED ORDER — CARISOPRODOL 350 MG PO TABS
350.0000 mg | ORAL_TABLET | Freq: Two times a day (BID) | ORAL | Status: DC
  Administered 2020-07-29 – 2020-07-31 (×4): 350 mg via ORAL
  Filled 2020-07-29 (×4): qty 1

## 2020-07-29 MED ORDER — ACETAMINOPHEN 325 MG PO TABS: 650.0000 mg | ORAL_TABLET | Freq: Four times a day (QID) | ORAL | Status: DC | PRN

## 2020-07-29 MED ORDER — SODIUM CHLORIDE 0.9% FLUSH
3.0000 mL | Freq: Two times a day (BID) | INTRAVENOUS | Status: DC
  Administered 2020-07-29 – 2020-07-31 (×4): 3 mL via INTRAVENOUS

## 2020-07-29 MED ORDER — PRAZOSIN HCL 2 MG PO CAPS
5.0000 mg | ORAL_CAPSULE | Freq: Every day | ORAL | Status: DC
  Administered 2020-07-29 – 2020-07-31 (×3): 5 mg via ORAL
  Filled 2020-07-29 (×3): qty 1

## 2020-07-29 MED ORDER — ALBUTEROL SULFATE (2.5 MG/3ML) 0.083% IN NEBU: 2.5000 mg | INHALATION_SOLUTION | RESPIRATORY_TRACT | Status: DC | PRN

## 2020-07-29 MED ORDER — SERTRALINE HCL 100 MG PO TABS
200.0000 mg | ORAL_TABLET | Freq: Every day | ORAL | Status: DC
  Administered 2020-07-29 – 2020-07-31 (×3): 200 mg via ORAL
  Filled 2020-07-29 (×5): qty 2

## 2020-07-29 MED ORDER — ACETAMINOPHEN 650 MG RE SUPP: 650.0000 mg | Freq: Four times a day (QID) | RECTAL | Status: DC | PRN

## 2020-07-29 NOTE — H&P (Signed)
Theresa Fernandez [id number] DOB: 03-Mar-1956 DOA: 07/29/2020    PCP: [facility]   Outpatient Specialists:  CARDS: Dr. Debara Pickett  Pulmonary   Dr. Valeta Harms  Patient arrived to ER on 07/29/20 at 1219 Referred by Attending Deno Etienne, DO   Patient coming from: home Lives  With family    Chief Complaint:   Chief Complaint  Patient presents with  . Chest Pain  . Shortness of Breath    HPI: Theresa Fernandez is a 65 y.o. female with medical history significant of CHF, DM2, COPD on 4.5L o2 at home.     Presented with worsening shortness of breath,despite being on her home o2. She has been out of her medications for the past 3 days. Have not had her LAsix She is supposed to be on 40 mg of Lasix Reports much harder time with walking getting a lot of shortness of breath She has been having some hot and cold flashes.  And a bit of diarrhea Denies any sick contacts. She no longer smokes and has been using Lasix on regular basis until she ran out recently.  Infectious risk factors:  Reports shortness of breath,   chest pain N/V/Diarrhea    Has   been vaccinated against COVID and boosted   Initial COVID TEST  NEGATIVE   Lab Results  Component Value Date   [place] NEGATIVE 07/29/2020    Regarding pertinent Chronic problems:     Hyperlipidemia -  on statins Lipid Panel     Component Value Date/Time   CHOL 208 (H) 02/11/2013 0922   TRIG 132 02/11/2013 0922   HDL 47 02/11/2013 0922   CHOLHDL 4.4 02/11/2013 0922   VLDL 26 02/11/2013 0922   LDLCALC 135 (H) 02/11/2013 0922     HTN on Lisinopril  chronic CHF diastolic - last echo 3664 (grade 1 diastolic dysfunction).    DM 2 -  Lab Results  Component Value Date   HGBA1C positive 02/15/2013   on PO meds only,     Morbid obesity-   BMI Readings from Last 1 Encounters:  07/17/20 44.70 kg/m      COPD -    followed by pulmonology   on baseline oxygen  4.5L,      CKD stage IIIa- baseline Cr 1.3 Estimated  Creatinine Clearance: 72 mL/min (A) (by C-G formula based on SCr of 1.02 mg/dL (H)).  Lab Results  Component Value Date   CREATININE 1.02 (H) 07/29/2020   CREATININE 1.31 (H) 01/01/2020   CREATININE 1.62 (H) 10/21/2018     Chronic anemia - baseline hg Hemoglobin & Hematocrit  Recent Labs    01/01/20 1039 07/29/20 1245  HGB 12.1 10.0*    While in ER: Was diagnosed with CHF and started on IV lasix   Hospitalist was called for admission for acute on chronic diastolic CHF exacerbation  The following Work up has been ordered so far:  Orders Placed This Encounter  Procedures  . Resp Panel by RT-PCR (Flu A&B, Covid) Nasopharyngeal Swab  . DG Chest 2 View  . Basic metabolic panel  . CBC  . Brain natriuretic peptide  . Document Height and Actual Weight  . Consult for [place] Admission  ALL PATIENTS BEING ADMITTED/HAVING PROCEDURES NEED COVID-19 SCREENING  . Airborne and Contact precautions  . ED EKG    Following Medications were ordered in ER: Medications  furosemide (LASIX) injection 40 mg (40 mg Intravenous Given 07/29/20 1816)      Significant initial  Findings: Abnormal Labs Reviewed  BASIC METABOLIC PANEL - Abnormal; Notable for the following components:      Result Value   Glucose, Bld 119 (*)    Creatinine, Ser 1.02 (*)    All other components within normal limits  CBC - Abnormal; Notable for the following components:   RBC 3.34 (*)    Hemoglobin 10.0 (*)    HCT 32.7 (*)    All other components within normal limits   Otherwise labs showing:    Recent Labs  Lab 07/29/20 1245  NA 143  K 4.3  CO2 26  GLUCOSE 119*  BUN 11  CREATININE 1.02*  CALCIUM 9.0    Cr  Down from baseline see below Lab Results  Component Value Date   CREATININE 1.02 (H) 07/29/2020   CREATININE 1.31 (H) 01/01/2020   CREATININE 1.62 (H) 10/21/2018    No results for input(s): AST, ALT, ALKPHOS, BILITOT, PROT, ALBUMIN in the last 168 hours. Lab Results  Component Value  Date   CALCIUM 9.0 07/29/2020      WBC      Component Value Date/Time   WBC 8.0 07/29/2020 1245   LYMPHSABS 1.8 06/06/2018 1458   MONOABS 0.6 06/06/2018 1458   EOSABS 0.3 06/06/2018 1458   BASOSABS 0.1 06/06/2018 1458    Plt: Lab Results  Component Value Date   PLT 261 07/29/2020       Venous  Blood Gas result:  pH 7.456 pCO2 44;   ABG    Component Value Date/Time   PHART 7.335 (L) 06/06/2018 1717   PCO2ART 40.2 06/06/2018 1717   PO2ART 76.0 (L) 06/06/2018 1717   HCO3 21.4 06/06/2018 1717   TCO2 23 06/06/2018 1717   ACIDBASEDEF 4.0 (H) 06/06/2018 1717   O2SAT 94.0 06/06/2018 1717      HG/HCT Down  from baseline see below    Component Value Date/Time   HGB 10.0 (L) 07/29/2020 1245   HCT 32.7 (L) 07/29/2020 1245   MCV 97.9 07/29/2020 1245    No results for input(s): LIPASE, AMYLASE in the last 168 hours. No results for input(s): AMMONIA in the last 168 hours.    Troponin 8 - 8     ECG: Ordered Personally reviewed by me showing: HR : 73 Rhythm: NSR   no evidence of ischemic changes QTC 429   BNP (last 3 results) Recent Labs    07/29/20 1804  BNP 158.2*      DM  labs:  HbA1C: Recent Labs    07/29/20 1940  HGBA1C 5.6       CBG (last 3)  No results for input(s): GLUCAP in the last 72 hours.      Ordered    CXR - Cardiomegaly and mild pulmonary venous congestion/mild edema.     ED Triage Vitals [07/29/20 1223]  Enc Vitals Group     BP (!) 146/73     Pulse Rate 67     Resp 16     Temp 98.2 F (36.8 C)     Temp Source Oral     SpO2 96 %     Weight      Height      Head Circumference      Peak Flow      Pain Score      Pain Loc      Pain Edu?      Excl. in [place]?   [id number]       Latest  Blood pressure (!) 155/49,  pulse 66, temperature 98.2 F (36.8 C), temperature source Oral, resp. rate 18, SpO2 99 %.      Review of Systems:   chills, fatigue,   Pertinent positives include:  Headaches, shortness of breath at rest.    dyspnea on exertion,  Constitutional:  No weight loss, night sweats, Fevers,weight loss  HEENT:  No  Difficulty swallowing,Tooth/dental problems,Sore throat,  No sneezing, itching, ear ache, nasal congestion, post nasal drip,  Cardio-vascular:  No chest pain, Orthopnea, PND, anasarca, dizziness, palpitations.no Bilateral lower extremity swelling  GI:  No heartburn, indigestion, abdominal pain, nausea, vomiting, diarrhea, change in bowel habits, loss of appetite, melena, blood in stool, hematemesis Resp:  no  No excess mucus, no productive cough, No non-productive cough, No coughing up of blood.No change in color of mucus.No wheezing. Skin:  no rash or lesions. No jaundice GU:  no dysuria, change in color of urine, no urgency or frequency. No straining to urinate.  No flank pain.  Musculoskeletal:  No joint pain or no joint swelling. No decreased range of motion. No back pain.  Psych:  No change in mood or affect. No depression or anxiety. No memory loss.  Neuro: no localizing neurological complaints, no tingling, no weakness, no double vision, no gait abnormality, no slurred speech, no confusion  All systems reviewed and apart from [place] all are negative  Past Medical History:   Past Medical History:  Diagnosis Date  . Allergy   . Anxiety   . Arthritis   . CHF (congestive heart failure) ([place])   . COPD (chronic obstructive pulmonary disease) ([place])   . Depression   . Diabetes mellitus without complication ([place])   . HTN (hypertension) 01/12/2013   pt denies     Past Surgical History:  Procedure Laterality Date  . MASTOIDECTOMY    . TONSILLECTOMY    . TUBAL LIGATION      Social History:  Ambulatory  walker       reports that she quit smoking about 2 years ago. Her smoking use included cigarettes. She has a 150.00 pack-year smoking history. She has never used smokeless tobacco. She reports that she does not drink alcohol and does not use drugs.    Family History:    Family History  Problem Relation Age of Onset  . Diabetes Mother   . Alcohol abuse Father   . Diabetes Father   . Stroke Brother     Allergies: Allergies  Allergen Reactions  . Ceftriaxone Hives and Rash    After second dose on 06/07/2018,  . Topamax [Topiramate] Other (See Comments)    Made headache worse  . Voltaren [Diclofenac Sodium] Other (See Comments)    Caused migraine     Prior to Admission medications   Medication Sig Start Date End Date Taking? Authorizing Provider  albuterol (ACCUNEB) 0.63 MG/3ML nebulizer solution Take 3 mLs (0.63 mg total) by nebulization every 4 (four) hours as needed for wheezing or shortness of breath. 07/17/20  Yes Icard, Bradley L, DO  albuterol (VENTOLIN HFA) 108 (90 Base) MCG/ACT inhaler Inhale 2 puffs into the lungs every 6 (six) hours as needed for wheezing or shortness of breath (wheezing). 07/17/20  Yes Icard, Octavio Graves, DO  ALPRAZolam (XANAX) 0.5 MG tablet Take 0.5 mg by mouth at bedtime.  11/28/19  Yes [provider]  busPIRone (BUSPAR) 15 MG tablet Take 30 mg by mouth at bedtime.    Yes [provider]  carisoprodol (SOMA) 250 MG tablet Take 250 mg by  mouth See admin instructions. Take one tablet (250 mg) by mouth twice daily - 7pm and midnight 11/27/19  Yes [provider]  HYDROcodone-acetaminophen (NORCO) 10-325 MG tablet Take 1 tablet by mouth every 6 (six) hours as needed (pain).    Yes [provider]  hydrOXYzine (ATARAX/VISTARIL) 25 MG tablet Take 25 mg by mouth at bedtime.  04/21/18  Yes [provider]  lamoTRIgine (LAMICTAL) 25 MG tablet Take 25 mg by mouth at bedtime.  04/21/18  Yes [provider]  Lancets (FREESTYLE) lancets 1 each daily. 02/20/20  Yes [provider]  lisinopril (PRINIVIL,ZESTRIL) 2.5 MG tablet Take 2.5 mg by mouth daily. For kidney function   Yes [provider]  lovastatin (MEVACOR) 40 MG tablet Take 80 mg by mouth daily. 03/18/20  Yes  [provider]  Magnesium 500 MG TABS Take 500-1,000 mg by mouth See admin instructions. Take 1 tablet (500 mg) by mouth every morning and 2 tablets (1000 mg) at night   Yes [provider]  omeprazole (PRILOSEC) 40 MG capsule Take 40 mg by mouth daily.   Yes [provider]  ondansetron (ZOFRAN) 8 MG tablet Take 1 tablet (8 mg total) by mouth every 8 (eight) hours as needed for nausea or vomiting. 01/01/20  Yes Daleen Bo, MD  pioglitazone (ACTOS) 15 MG tablet Take 15 mg by mouth daily. 09/13/19  Yes [provider]  prazosin (MINIPRESS) 5 MG capsule Take 5 mg by mouth at bedtime. 12/04/19  Yes [provider]  predniSONE (DELTASONE) 20 MG tablet Take 20 mg by mouth 2 (two) times daily. 03/18/20  Yes [provider]  pregabalin (LYRICA) 150 MG capsule Take 1 capsule (150 mg total) by mouth at bedtime. Patient taking differently: Take 100 mg by mouth 2 (two) times daily. 02/11/13  Yes Johnson, Clanford L, MD  sertraline (ZOLOFT) 100 MG tablet Take 200 mg by mouth at bedtime.    Yes [provider]  Tiotropium Bromide-Olodaterol (STIOLTO RESPIMAT) 2.5-2.5 MCG/ACT AERS Inhale 2 puffs into the lungs daily. 07/17/20  Yes Icard, Bradley L, DO  Tiotropium Bromide-Olodaterol 2.5-2.5 MCG/ACT AERS Inhale 2 puffs into the lungs daily. 07/17/20  Yes Icard, Octavio Graves, DO  Vitamin D, Ergocalciferol, (DRISDOL) 1.25 MG (50000 UNIT) CAPS capsule Take 50,000 Units by mouth every Tuesday.   Yes [provider]  amphetamine-dextroamphetamine (ADDERALL) 20 MG tablet Take 1 tablet (20 mg total) by mouth 3 (three) times daily. Patient not taking: Reported on 07/29/2020 01/12/13   Theodis Blaze, MD  furosemide (LASIX) 40 MG tablet Take 1 tablet (40 mg total) by mouth 2 (two) times daily. Patient not taking: Reported on 07/29/2020 10/19/18   [name], MD  metolazone (ZAROXOLYN) 2.5 MG tablet Take 1 tablet (2.5 mg total) by mouth every other day. As  needed for weight gain of 2 lbs 10/23/18 04/25/20  [name], MD  budesonide-formoterol (SYMBICORT) 80-4.5 MCG/ACT inhaler Inhale 2 puffs into the lungs 2 (two) times daily. Patient not taking: No sig reported 07/20/18 11/23/19  Martyn Ehrich, NP   Physical Exam: Vitals with BMI 07/29/2020 07/29/2020 07/29/2020  Height - - -  Weight - - -  BMI - - -  Systolic [id number]  Diastolic 49 [id number]  Pulse 66 71 69    1. General:  in No    Chronically ill -appearing 2. Psychological: Alert and   Oriented 3. Head/ENT:   Moist Mucous Membranes  Head Non traumatic, neck supple                            Poor Dentition 4. SKIN: normal  Skin turgor,  Skin clean Dry and intact no rash 5. Heart: Regular rate and rhythm no  Murmur, no Rub or gallop 6. Lungs: no wheezes mild  crackles   7. Abdomen: Soft, non-tender, Non distended   obese  bowel sounds present 8. Lower extremities: no clubbing, cyanosis,  No edema 9. Neurologically Grossly intact, moving all 4 extremities equally   10. MSK: Normal range of motion   All other LABS:     Recent Labs  Lab 07/29/20 1245  WBC 8.0  HGB 10.0*  HCT 32.7*  MCV 97.9  PLT 261     Recent Labs  Lab 07/29/20 1245  NA 143  K 4.3  CL 105  CO2 26  GLUCOSE 119*  BUN 11  CREATININE 1.02*  CALCIUM 9.0     No results for input(s): AST, ALT, ALKPHOS, BILITOT, PROT, ALBUMIN in the last 168 hours.     Cultures:    Component Value Date/Time   SDES BLOOD RIGHT FOREARM 06/06/2018 1518   SPECREQUEST  06/06/2018 1518    BOTTLES DRAWN AEROBIC AND ANAEROBIC Blood Culture adequate volume   CULT  06/06/2018 1518    NO GROWTH 5 DAYS Performed at [facility], [address] [place], [place] [address]    REPTSTATUS 06/11/2018 FINAL 06/06/2018 1518     Radiological Exams on Admission: DG Chest 2 View  Result Date: 07/29/2020 CLINICAL DATA:  CHF exacerbation.  Pain. EXAM: CHEST - 2 VIEW COMPARISON:  None FINDINGS:  Cardiomegaly and pulmonary venous congestion/mild edema. No pneumothorax. No nodules, masses, or focal pulmonary infiltrates. The hila and mediastinum are normal. Previous kyphoplasties at multiple levels in the thoracic spine. IMPRESSION: Cardiomegaly and mild pulmonary venous congestion/mild edema. Electronically Signed   By: Gerome Sam III M.D   On: 07/29/2020 12:56    Chart has been reviewed    Assessment/Plan  65 y.o. female with medical history significant of CHF, DM2, COPD on 4.5L o2 at home.  Admitted for acute on chronic diastolic heart failure  Present on Admission: . Acute on chronic diastolic HF (heart failure) (HCC)) -  - admit on telemetry,  cycle cardiac enzymes, Troponin 8-8    obtain serial ECG  to evaluate for ischemia as a cause of heart failure  monitor daily weight:  Last BNP BNP (last 3 results) Recent Labs    07/29/20 1804  BNP 158.2*       diurese with IV lasix and monitor orthostatics and creatinine to avoid over diuresis.  Order echogram to evaluate EF and valves  ACE/ARBi   Contraindicated hyperkalemia     . Interstitial pulmonary disease (HCC) chronic continue home medications  . HTN (hypertension)  -continue home medications but hold lisinopril given elevated  . Dyslipidemia -stable continue home meds  Anemia suspect dilutional we will repeat after being diuresed if persists may need further work-up  . CKD (chronic kidney disease) stage 3, GFR 30-59 ml/min (HCC)-  -chronic avoid nephrotoxic medications such as NSAIDs, Vanco Zosyn combo,  avoid hypotension, continue to follow renal function   Dm2 -  - Order Sensitive SSI     -  check TSH and HgA1C  - Hold by mouth medications     Hyperkalemia -mild hold lisinopril Other plan as per orders.  DVT prophylaxis:    Lovenox       Code Status:    Code Status: Prior FULL CODE  as per patient   I had personally discussed CODE STATUS with patient      Family Communication:   Family    at  Bedside  plan of care was discussed  with  , Husband,   Disposition Plan:    To home once workup is complete and patient is stable   Following barriers for discharge:                            Electrolytes corrected                             Hypoxia stable                     Would benefit from PT/OT eval prior to DC  Ordered                                       Consults called: none    Admission status:  ED Disposition    ED Disposition Condition [place]: [place] [[id number]]  Level of Care: Telemetry Cardiac [[id number]]  May admit patient to [facility] or [facility] if equivalent level of care is available:: No  Covid Evaluation: Confirmed COVID Negative  Diagnosis: [facility]ation [id number]  Admitting Physician: Toy Baker [[id number]]  Attending Physician: Toy Baker [[id number]]  Estimated length of stay: past midnight tomorrow  Certification:: I certify this patient will need inpatient services for at least 2 midnights          inpatient     I Expect 2 midnight stay secondary to severity of patient's current illness need for inpatient interventions justified by the following:   hemodynamic instability despite optimal treatment (  Hypoxia, )  Severe lab/radiological/exam abnormalities including:    CHF  and extensive comorbidities including:  Chronic pain  DM2   CHF  COPD/asthma  Morbid Obesity  CKD    That are currently affecting medical management.   I expect  patient to be hospitalized for 2 midnights requiring inpatient medical care.  Patient is at high risk for adverse outcome (such as loss of life or disability) if not treated.  Indication for inpatient stay as follows:    New or worsening hypoxia  Need for IV diuretics    Level of care      tele  For   24H       Lab Results  Component Value Date   [place] 07/29/2020     Precautions: admitted as  Covid Negative     PPE:  Used by the provider:   P100  eye Goggles,  Gloves       Nalani Andreen 07/29/2020, 8:11 PM    [facility]     after 2 AM please page floor coverage PA If 7AM-7PM, please contact the day team taking care of the patient using [email or web address]   Patient was evaluated in the context of the global COVID-19 pandemic, which necessitated consideration that the patient might be at risk for infection with the SARS-CoV-2 virus that causes COVID-19. Institutional protocols and algorithms that pertain to the evaluation of patients at risk  for COVID-19 are in a state of rapid change based on information released by regulatory bodies including the CDC and federal and state organizations. These policies and algorithms were followed during the patient's care.

## 2020-07-29 NOTE — ED Notes (Signed)
Pt usually wears 3L at home. O2 maintaining at 98% on 3L.

## 2020-07-29 NOTE — ED Provider Notes (Signed)
[facility] EMERGENCY DEPARTMENT Provider Note   CSN: [id number] Arrival date & time: 07/29/20  1219     History Chief Complaint  Patient presents with  . Chest Pain  . Shortness of Breath    Theresa Fernandez is a 65 y.o. female.  HPI Patient is a 65 year old female with a history of COPD, CHF, depression, DM, HTN who presents to ED for shortness of breath. Patient is accompanied by her husband, who states that patient has been out of her Lasix at home since 2 days ago. She typically takes 40 mg once daily. Patient feels that this is most likely the source of her dyspnea. She has decreased exercise tolerance. She is also been experiencing "hot and cold flashes" and some intermittent diarrhea. No sick contacts. Patient is vaccinated and boosted against COVID-19. She wears 3 L nasal cannula oxygen at home 24/7.    Past Medical History:  Diagnosis Date  . Allergy   . Anxiety   . Arthritis   . CHF (congestive heart failure) ([place])   . COPD (chronic obstructive pulmonary disease) ([place])   . Depression   . Diabetes mellitus without complication ([place])   . HTN (hypertension) 01/12/2013   pt denies    Patient Active Problem List   Diagnosis Date Noted  . Chronic respiratory failure with hypoxia ([place]) 11/23/2019  . Body mass index 45.0-49.9, adult ([place]) 11/23/2019  . OSA (obstructive sleep apnea) 11/23/2019  . Acute on chronic diastolic HF (heart failure) ([place]) 10/19/2018  . Hypokalemia 10/19/2018  . 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 Diastolic heart failure ([place]) 08/25/2018  . Interstitial pulmonary disease ([place]) 07/26/2018  . Tobacco abuse 06/22/2018  . Former smoker 06/22/2018  . Acute on chronic heart failure with preserved ejection fraction (HFpEF) ([place]) 06/09/2018  . CKD (chronic kidney disease) stage 3, GFR 30-59 ml/min ([facility]) 06/09/2018  . Other emphysema ([place]) 06/06/2018  . Cellulitis of abdominal wall 06/06/2018  . Spinal  compression fracture ([place])   . Knee pain 03/19/2013  . DM2 (diabetes mellitus, type 2) ([place]) 02/11/2013  . Muscle cramps 02/11/2013  . Dyslipidemia 02/11/2013  . Healthcare maintenance 02/11/2013  . HTN (hypertension) 01/12/2013  . Depression 01/12/2013    Past Surgical History:  Procedure Laterality Date  . MASTOIDECTOMY    . TONSILLECTOMY    . TUBAL LIGATION       OB History   No obstetric history on file.     Family History  Problem Relation Age of Onset  . Diabetes Mother   . Alcohol abuse Father   . Diabetes Father   . Stroke Brother     Social History   Tobacco Use  . Smoking status: Former Smoker    Packs/day: 3.00    Years: 50.00    Pack years: 150.00    Types: Cigarettes    Quit date: 05/30/2018    Years since quitting: 2.1  . Smokeless tobacco: Never Used  Vaping Use  . Vaping Use: Never used  Substance Use Topics  . Alcohol use: No  . Drug use: No    Home Medications Prior to Admission medications   Medication Sig Start Date End Date Taking? Authorizing Provider  albuterol (ACCUNEB) 0.63 MG/3ML nebulizer solution Take 3 mLs (0.63 mg total) by nebulization every 4 (four) hours as needed for wheezing or shortness of breath. 07/17/20   Icard, Octavio Graves, DO  albuterol (VENTOLIN HFA) 108 (90 Base) MCG/ACT inhaler Inhale 2 puffs into the  lungs every 6 (six) hours as needed for wheezing or shortness of breath (wheezing). 07/17/20   Icard, Octavio Graves, DO  ALPRAZolam Duanne Moron) 0.5 MG tablet Take 0.5 mg by mouth at bedtime.  11/28/19   [provider]  amphetamine-dextroamphetamine (ADDERALL) 20 MG tablet Take 1 tablet (20 mg total) by mouth 3 (three) times daily. Patient taking differently: Take 20 mg by mouth See admin instructions. Take one tablet (20 mg) by mouth every morning, may also take one tablet (20 mg) after lunch if needed for exhaustion 01/12/13   Theodis Blaze, MD  busPIRone (BUSPAR) 15 MG tablet Take 30 mg by mouth at bedtime.     [provider]  carisoprodol (SOMA) 250 MG tablet Take 250 mg by mouth See admin instructions. Take one tablet (250 mg) by mouth twice daily - 7pm and midnight 11/27/19   [provider]  furosemide (LASIX) 40 MG tablet Take 1 tablet (40 mg total) by mouth 2 (two) times daily. Patient taking differently: Take 40 mg by mouth daily as needed for fluid or edema. 10/19/18   Hilty, Nadean Corwin, MD  HYDROcodone-acetaminophen (NORCO) 10-325 MG tablet Take 1 tablet by mouth every 6 (six) hours as needed (pain).     [provider]  hydrOXYzine (ATARAX/VISTARIL) 25 MG tablet Take 25 mg by mouth at bedtime.  04/21/18   [provider]  lamoTRIgine (LAMICTAL) 25 MG tablet Take 25 mg by mouth at bedtime.  04/21/18   [provider]  lisinopril (PRINIVIL,ZESTRIL) 2.5 MG tablet Take 2.5 mg by mouth daily. For kidney function    [provider]  Magnesium 500 MG TABS Take 500-1,000 mg by mouth See admin instructions. Take 1 tablet (500 mg) by mouth every morning and 2 tablets (1000 mg) at night    [provider]  metolazone (ZAROXOLYN) 2.5 MG tablet Take 1 tablet (2.5 mg total) by mouth every other day. As needed for weight gain of 2 lbs 10/23/18 04/25/20  [name], MD  omeprazole (PRILOSEC) 40 MG capsule Take 40 mg by mouth daily.    [provider]  ondansetron (ZOFRAN) 8 MG tablet Take 1 tablet (8 mg total) by mouth every 8 (eight) hours as needed for nausea or vomiting. 01/01/20   Daleen Bo, MD  pioglitazone (ACTOS) 15 MG tablet Take 15 mg by mouth daily. 09/13/19   [provider]  Potassium 99 MG TABS Take 99 mg by mouth at bedtime.    [provider]  prazosin (MINIPRESS) 5 MG capsule Take 5 mg by mouth at bedtime. 12/04/19   [provider]  pregabalin (LYRICA) 150 MG capsule Take 1 capsule (150 mg total) by mouth at bedtime. Patient taking differently: Take 100 mg by mouth 2 (two) times daily. 02/11/13   Johnson,  Clanford L, MD  sertraline (ZOLOFT) 100 MG tablet Take 200 mg by mouth at bedtime.     [provider]  Tiotropium Bromide-Olodaterol (STIOLTO RESPIMAT) 2.5-2.5 MCG/ACT AERS Inhale 2 puffs into the lungs daily. 07/17/20   Garner Nash, DO  Tiotropium Bromide-Olodaterol 2.5-2.5 MCG/ACT AERS Inhale 2 puffs into the lungs daily. 07/17/20   Garner Nash, DO  Vitamin D, Ergocalciferol, (DRISDOL) 1.25 MG (50000 UNIT) CAPS capsule Take 50,000 Units by mouth every Tuesday.    [provider]  budesonide-formoterol (SYMBICORT) 80-4.5 MCG/ACT inhaler Inhale 2 puffs into the lungs 2 (two) times daily. Patient not taking: No sig reported 07/20/18 11/23/19  Martyn Ehrich, NP  Allergies    Ceftriaxone, Topamax [topiramate], and Voltaren [diclofenac sodium]  Review of Systems   Review of Systems  Constitutional: Positive for chills and fever (Subjective, no measured fevers at home).  HENT: Negative for ear pain and sore throat.   Eyes: Negative for pain and visual disturbance.  Respiratory: Positive for cough, chest tightness, shortness of breath and wheezing.   Cardiovascular: Positive for leg swelling. Negative for chest pain.  Gastrointestinal: Positive for diarrhea. Negative for abdominal pain, nausea and vomiting.  Genitourinary: Negative for dysuria and hematuria.  Musculoskeletal: Negative for gait problem and joint swelling.  Skin: Negative for color change and rash.  Neurological: Positive for headaches. Negative for syncope and light-headedness.  Psychiatric/Behavioral: Negative for confusion and suicidal ideas.    Physical Exam Updated Vital Signs BP 135/74   Pulse 69   Temp 98.2 F (36.8 C) (Oral)   Resp (!) 21   SpO2 100%   Physical Exam Vitals and nursing note reviewed.  Constitutional:      General: She is in acute distress.     Appearance: She is well-developed and well-nourished. She is obese. She is ill-appearing. She is not toxic-appearing.   HENT:     Head: Normocephalic and atraumatic.     Nose: Nose normal.     Mouth/Throat:     Mouth: Mucous membranes are moist.  Eyes:     Extraocular Movements: Extraocular movements intact.     Conjunctiva/sclera: Conjunctivae normal.  Cardiovascular:     Rate and Rhythm: Normal rate and regular rhythm.     Pulses: Normal pulses.          Radial pulses are 2+ on the left side.     Heart sounds: No murmur heard. No friction rub. No gallop.   Pulmonary:     Effort: Respiratory distress present.     Breath sounds: Rales present.  Abdominal:     Palpations: Abdomen is soft.     Tenderness: There is no abdominal tenderness. There is no guarding or rebound.  Musculoskeletal:     Cervical back: Neck supple.     Right lower leg: Edema present.     Left lower leg: Edema present.     Comments: Trace pitting edema bilateral lower legs  Skin:    General: Skin is warm and dry.  Neurological:     Mental Status: She is alert.     Comments: Alert, grossly oriented, moves all extremities spontaneously  Psychiatric:        Mood and Affect: Mood and affect normal.        Behavior: Behavior normal.     ED Results / Procedures / Treatments   Labs (all labs ordered are listed, but only abnormal results are displayed) Labs Reviewed  BASIC METABOLIC PANEL - Abnormal; Notable for the following components:      Result Value   Glucose, Bld 119 (*)    Creatinine, Ser 1.02 (*)    All other components within normal limits  CBC - Abnormal; Notable for the following components:   RBC 3.34 (*)    Hemoglobin 10.0 (*)    HCT 32.7 (*)    All other components within normal limits  RESP PANEL BY RT-PCR (FLU A&B, COVID) ARPGX2  BRAIN NATRIURETIC PEPTIDE  TROPONIN I (HIGH SENSITIVITY)  TROPONIN I (HIGH SENSITIVITY)    EKG EKG Interpretation  Date/Time:  Saturday July 29 2020 12:31:46 EST Ventricular Rate:  73 PR Interval:  148 QRS Duration: 82 QT Interval:  390 QTC Calculation: 429 R  Axis:   97 Text Interpretation: Sinus rhythm with marked sinus arrhythmia Rightward axis Low voltage QRS Borderline ECG No significant change since last tracing Confirmed by Deno Etienne [phone]) on 07/29/2020 4:38:45 PM   Radiology DG Chest 2 View  Result Date: 07/29/2020 CLINICAL DATA:  CHF exacerbation.  Pain. EXAM: CHEST - 2 VIEW COMPARISON:  None FINDINGS: Cardiomegaly and pulmonary venous congestion/mild edema. No pneumothorax. No nodules, masses, or focal pulmonary infiltrates. The hila and mediastinum are normal. Previous kyphoplasties at multiple levels in the thoracic spine. IMPRESSION: Cardiomegaly and mild pulmonary venous congestion/mild edema. Electronically Signed   By: Dorise Bullion III M.D   On: 07/29/2020 12:56    Procedures Procedures (including critical care time)  Medications Ordered in ED Medications  furosemide (LASIX) injection 40 mg (has no administration in time range)    ED Course  I have reviewed the triage vital signs and the nursing notes.  Pertinent labs & imaging results that were available during my care of the patient were reviewed by me and considered in my medical decision making (see chart for details).    MDM Rules/Calculators/A&P                         65 year old female with dyspnea in context of missing several days of her home diuretic. I am most concerned for CHF exacerbation, but differential includes ACS, viral illness including COVID-19, pneumonia, pleural effusion. 40 mg IV Lasix ordered.   Labs reviewed and notable for troponin negative x2. COVID-19 and BNP ordered and pending. Imaging interpreted by radiology and images personally reviewed. CXR shows cardiomegaly with mild pulmonary edema. ECG shows Sinus rhythm with sinus arrhythmia, rate 73. PR 148, QRS 82, QTc 429. There is no STEMI or evidence of acute ischemia/infarct. There are Q waves in the septal leads concerning for prior infarct. Significant baseline artifact.  Impression is  likely CHF exacerbation.  BNP and COVID-19 ordered and pending.  Patient will require admission for continued IV diuretics and further monitoring including telemetry.  She will be admitted to the hospitalist service.  She remained in stable condition at time of admission.  Final Clinical Impression(s) / ED Diagnoses Final diagnoses:  Shortness of breath     Vanna Scotland, MD [id number]    Floyd, [place], DO 07/30/20 1514

## 2020-07-29 NOTE — ED Triage Notes (Signed)
Pt to triage via [facility] from home.  Reports CHF exacerbation x 2 days and pain to center of chest.  Out of medicine x 3 days and having intermittent nosebleeds.

## 2020-07-30 ENCOUNTER — Inpatient Hospital Stay (HOSPITAL_COMMUNITY): Payer: Medicare Other

## 2020-07-30 DIAGNOSIS — E1122 Type 2 diabetes mellitus with diabetic chronic kidney disease: Secondary | ICD-10-CM | POA: Diagnosis not present

## 2020-07-30 DIAGNOSIS — Z6841 Body Mass Index (BMI) 40.0 and over, adult: Secondary | ICD-10-CM | POA: Diagnosis not present

## 2020-07-30 DIAGNOSIS — I10 Essential (primary) hypertension: Secondary | ICD-10-CM

## 2020-07-30 DIAGNOSIS — N1831 Chronic kidney disease, stage 3a: Secondary | ICD-10-CM

## 2020-07-30 DIAGNOSIS — J439 Emphysema, unspecified: Secondary | ICD-10-CM

## 2020-07-30 DIAGNOSIS — G4733 Obstructive sleep apnea (adult) (pediatric): Secondary | ICD-10-CM

## 2020-07-30 DIAGNOSIS — J984 Other disorders of lung: Secondary | ICD-10-CM

## 2020-07-30 DIAGNOSIS — R0602 Shortness of breath: Secondary | ICD-10-CM | POA: Diagnosis not present

## 2020-07-30 DIAGNOSIS — K254 Chronic or unspecified gastric ulcer with hemorrhage: Secondary | ICD-10-CM | POA: Diagnosis not present

## 2020-07-30 DIAGNOSIS — J849 Interstitial pulmonary disease, unspecified: Secondary | ICD-10-CM

## 2020-07-30 DIAGNOSIS — I5033 Acute on chronic diastolic (congestive) heart failure: Secondary | ICD-10-CM

## 2020-07-30 LAB — COMPREHENSIVE METABOLIC PANEL
ALT: 16 U/L (ref 0–44)
AST: 25 U/L (ref 15–41)
Albumin: 3.4 g/dL — ABNORMAL LOW (ref 3.5–5.0)
Alkaline Phosphatase: 91 U/L (ref 38–126)
Anion gap: 13 (ref 5–15)
BUN: 12 mg/dL (ref 8–23)
CO2: 25 mmol/L (ref 22–32)
Calcium: 9.2 mg/dL (ref 8.9–10.3)
Chloride: 107 mmol/L (ref 98–111)
Creatinine, Ser: 1.2 mg/dL — ABNORMAL HIGH (ref 0.44–1.00)
GFR, Estimated: 50 mL/min — ABNORMAL LOW (ref >60)
Glucose, Bld: 119 mg/dL — ABNORMAL HIGH (ref 70–99)
Potassium: 3.8 mmol/L (ref 3.5–5.1)
Sodium: 145 mmol/L (ref 135–145)
Total Bilirubin: 0.7 mg/dL (ref 0.3–1.2)
Total Protein: 6.7 g/dL (ref 6.5–8.1)

## 2020-07-30 LAB — HIV ANTIBODY (ROUTINE TESTING W REFLEX): HIV Screen 4th Generation wRfx: NONREACTIVE

## 2020-07-30 LAB — CBC WITH DIFFERENTIAL/PLATELET
Abs Immature Granulocytes: 0.13 10*3/uL — ABNORMAL HIGH (ref 0.00–0.07)
Basophils Absolute: 0 10*3/uL (ref 0.0–0.1)
Basophils Relative: 0 %
Eosinophils Absolute: 0.1 10*3/uL (ref 0.0–0.5)
Eosinophils Relative: 1 %
HCT: 34.8 % — ABNORMAL LOW (ref 36.0–46.0)
Hemoglobin: 10.4 g/dL — ABNORMAL LOW (ref 12.0–15.0)
Immature Granulocytes: 1 %
Lymphocytes Relative: 18 %
Lymphs Abs: 1.8 10*3/uL (ref 0.7–4.0)
MCH: 29.3 pg (ref 26.0–34.0)
MCHC: 29.9 g/dL — ABNORMAL LOW (ref 30.0–36.0)
MCV: 98 fL (ref 80.0–100.0)
Monocytes Absolute: 0.8 10*3/uL (ref 0.1–1.0)
Monocytes Relative: 8 %
Neutro Abs: 7.6 10*3/uL (ref 1.7–7.7)
Neutrophils Relative %: 72 %
Platelets: 265 10*3/uL (ref 150–400)
RBC: 3.55 MIL/uL — ABNORMAL LOW (ref 3.87–5.11)
RDW: 15.2 % (ref 11.5–15.5)
WBC: 10.5 10*3/uL (ref 4.0–10.5)
nRBC: 0 % (ref 0.0–0.2)

## 2020-07-30 LAB — CBG MONITORING, ED
Glucose-Capillary: 101 mg/dL — ABNORMAL HIGH (ref 70–99)
Glucose-Capillary: 103 mg/dL — ABNORMAL HIGH (ref 70–99)

## 2020-07-30 LAB — GLUCOSE, CAPILLARY
Glucose-Capillary: 92 mg/dL (ref 70–99)
Glucose-Capillary: 91 mg/dL (ref 70–99)

## 2020-07-30 LAB — POC OCCULT BLOOD, ED: Fecal Occult Bld: POSITIVE — AB

## 2020-07-30 LAB — HEMOGLOBIN AND HEMATOCRIT, BLOOD
HCT: 35.4 % — ABNORMAL LOW (ref 36.0–46.0)
HCT: 34.8 % — ABNORMAL LOW (ref 36.0–46.0)
Hemoglobin: 10.8 g/dL — ABNORMAL LOW (ref 12.0–15.0)
Hemoglobin: 10.7 g/dL — ABNORMAL LOW (ref 12.0–15.0)

## 2020-07-30 LAB — PHOSPHORUS: Phosphorus: 2.2 mg/dL — ABNORMAL LOW (ref 2.5–4.6)

## 2020-07-30 LAB — MAGNESIUM: Magnesium: 2.1 mg/dL (ref 1.7–2.4)

## 2020-07-30 LAB — TSH: TSH: 2.537 u[IU]/mL (ref 0.350–4.500)

## 2020-07-30 MED ORDER — K PHOS MONO-SOD PHOS DI & MONO 155-852-130 MG PO TABS
250.0000 mg | ORAL_TABLET | Freq: Three times a day (TID) | ORAL | Status: AC
  Administered 2020-07-30 (×2): 250 mg via ORAL
  Filled 2020-07-30 (×4): qty 1

## 2020-07-30 MED ORDER — SODIUM CHLORIDE 0.9 % IV SOLN
80.0000 mg | Freq: Once | INTRAVENOUS | Status: AC
  Administered 2020-07-30: 80 mg via INTRAVENOUS
  Filled 2020-07-30: qty 80

## 2020-07-30 MED ORDER — PANTOPRAZOLE SODIUM 40 MG IV SOLR: 40.0000 mg | Freq: Two times a day (BID) | INTRAVENOUS | Status: DC

## 2020-07-30 MED ORDER — SODIUM CHLORIDE 0.9 % IV SOLN
8.0000 mg/h | INTRAVENOUS | Status: DC
  Administered 2020-07-30 – 2020-07-31 (×2): 8 mg/h via INTRAVENOUS
  Filled 2020-07-30 (×4): qty 80

## 2020-07-30 MED ORDER — SODIUM CHLORIDE 0.9 % IV SOLN
80.0000 mg | Freq: Once | INTRAVENOUS | Status: AC
  Administered 2020-07-30: 18:00:00 80 mg via INTRAVENOUS
  Filled 2020-07-30 (×2): qty 80

## 2020-07-30 MED ORDER — CALCIUM GLUCONATE-NACL 1-0.675 GM/50ML-% IV SOLN
1.0000 g | Freq: Once | INTRAVENOUS | Status: AC
  Administered 2020-07-30: 1000 mg via INTRAVENOUS
  Filled 2020-07-30: qty 50

## 2020-07-30 NOTE — Progress Notes (Addendum)
PROGRESS NOTE  Theresa Fernandez YHC:[id number] DOB: Jun 29, 1956 DOA: 07/29/2020 PCP: [facility] [place]  Brief History   Theresa Fernandez is a 65 y.o. female with medical history significant of CHF, DM2, COPD on 4.5L o2 at home.   Presented with worsening shortness of breath,despite being on her home O2. She is on 2L O2 at home. In the ED she was requiring 4L to maintain SaO2 of 90%. She had been out of her medications including lasix for the past 3 days. She is supposed to be on 40 mg of Lasix. She has had severe dyspnea on exertion and shortness of breath. She has been having some hot and cold flashes.  And a bit of diarrhea. Denies any sick contacts. She no longer smokes and has been using Lasix on regular basis until she ran out recently.  Consultants  . None  Procedures  . None  Antibiotics   Anti-infectives (From admission, onward)   None    .   Subjective  The patient is resting comfortably. No new complaints.  Objective   Vitals:  Vitals:   07/30/20 0845 07/30/20 1045  BP: 137/67 131/68  Pulse: 69 80  Resp:  19  Temp:    SpO2: 97% 98%   Exam: Exam:  Constitutional:  . The patient is awake, alert, and oriented x 3. No acute distress. Respiratory:  . No increased work of breathing. . No wheezes or rhonchi] . Mild rales at bases bilaterally. . No tactile fremitus Cardiovascular:  . Regular rate and rhythm . No murmurs, ectopy, or gallups. . No lateral PMI. No thrills. Abdomen:  . Abdomen is soft, non-tender, non-distended . No hernias, masses, or organomegaly . Normoactive bowel sounds.  Musculoskeletal:  . No cyanosis, clubbing, or edema Skin:  . No rashes, lesions, ulcers . palpation of skin: no induration or nodules Neurologic:  . CN 2-12 intact . Sensation all 4 extremities intact Psychiatric:  . Mental status o Mood, affect appropriate o Orientation to person, place, time  . judgment and insight appear intact  I have personally reviewed  the following:   Today's Data  . Vitals, CBC, CMP, ionized calcium  Imaging  . CXR  Cardiology Data  . EKG  Scheduled Meds: . ALPRAZolam  0.5 mg Oral QHS  . arformoterol  15 mcg Nebulization BID  . busPIRone  30 mg Oral QHS  . carisoprodol  350 mg Oral BID  . enoxaparin (LOVENOX) injection  30 mg Subcutaneous Q24H  . furosemide  40 mg Intravenous Q12H  . insulin aspart  0-5 Units Subcutaneous QHS  . insulin aspart  0-9 Units Subcutaneous TID WC  . lamoTRIgine  25 mg Oral QHS  . pantoprazole  40 mg Oral Daily  . phosphorus  250 mg Oral Q8H  . pravastatin  80 mg Oral q1800  . prazosin  5 mg Oral QHS  . sertraline  200 mg Oral QHS  . sodium chloride flush  3 mL Intravenous Q12H   Continuous Infusions: . sodium chloride      Active Problems:   HTN (hypertension)   DM2 (diabetes mellitus, type 2) (H[facility])   Dyslipidemia   CKD (chronic kidney disease) stage 3, GFR 30-59 ml/min (HCC)   Interstitial pulmonary disease ([facility])   Mixed restrictive and obstructive lung disease ([facility])   Acute on chronic diastolic HF (heart failure) ([facility])   Body mass index 45.0-49.9, adult ([facility])   OSA (obstructive sleep apnea)   CHF exacerbation ([place])   LOS: 1 day  A & P  Acute on chronic respiratory failure: The patient is on 3L O2 by nasal cannula at home. Upon admission the patient required   Acute on chronic diastolic HF (heart failure) ([place])): Due to medication non-compliance. The patient has been admitted to a telemetry bed. She is receiving lasix 12 mg IV bid. EKG demonstrate no ischemic changes. Troponins were within normal limits and flat. Echocardiogram is pending. ACE/ARBi were contraindicated due to hyperkalemia on admission.   Pulmonary edema: Present on CXR due to acute on chronic diastolic heart failure.    Interstitial pulmonary disease ([place]): This is chronic, but certainly is worsened by pulmonary edema. Continue home medications  HTN (hypertension): Fair control on prazosin  alone. Lisinopril is held due to hyperkalemia.   Dyslipidemia: Continue Pravachol as at home.  Anemia: Chronic. Check anemia panel. Suspect dilutional we will repeat after being diuresed if persists may need further work-up. Pt had black stool today. Guaiac pending.  CKD (chronic kidney disease) stage 3a, GFR 30-59 ml/min ([place]): Chronic. Avoid nephrotoxic medications such as NSAIDs, Vanco Zosyn combo.  Avoid hypotension. Monitor creatinine, electrolytes, and volume status.   Dm II: At home the patient is on Actos. HbA1c is 5.8. Here glucoses will be followed with FSBS and SSI.   Hyperkalemia:  Resolved. Was mild. Lisinopril is held.   I have seen and examined this patient myself. I have spent 34 minutes in her evaluation and care.  DVT prophylaxis:    Lovenox    Code Status:    Code Status: Prior FULL CODE  as per patient   I had personally discussed CODE STATUS with patient    Family Communication:   None available  Disposition Plan:     Status is: Inpatient  Remains inpatient appropriate because:Inpatient level of care appropriate due to severity of illness   Dispo: The patient is from: Home              Anticipated d/c is to: Home              Anticipated d/c date is: 2 days              Patient currently is not medically stable to d/c.  Ancelmo Hunt, DO [facility] Direct contact: see [email or web address]  7PM-7AM contact night coverage as above 07/30/2020, 1:39 PM  LOS: 1 day   ADDENDUM: I was contacted by nursing and advised that the patient has had a black bowel movement. I have stopped lovenox, ordered hemoglobin Q8h, and started IV protonix. Will make the patient NPO after midnight and consult GI in the am.

## 2020-07-30 NOTE — Evaluation (Signed)
Physical Therapy Evaluation & Discharge Patient Details Name: Theresa Fernandez MRN: [id number] DOB: [birth year] Today's Date: 07/30/2020   History of Present Illness  Pt is a 65 y.o. female who presents with increased dyspnea. She was admitted for acute on chronic diastolic heart failure due to medication non-compliance (ran out of Lasix 3 days prior). CXR revealed pulmonary edema. PMH: CHF, DM2, COPD, HTN, anxiety, arthritis  Clinical Impression  PTA pt was mod I with use of a rollator for mobility within her home, ambulating short distances to/from the bathroom on 3L/min O2 via [place]. She did require assistance for stair negotiation and car transfers from her husband though. Currently, she displays WFL leg strength with slight weakness in the hip flexors (R > L) and hamstrings. She displays intact lower extremity coordination, sensation to light touch, and dynamic proprioception. Simulating home, pt able to perform all bed mob with use of bed control and no rails with mod I and perform transfers and ambulate within the room with a RW with supervision this date. No LOB. Pt appears and reports to be at her baseline level of function. All education completed and questions answered. Pt educated to ambulate with nursing staff often and get OOB to chair while in hospital to reduce risk for deconditioning. PT will sign off at this time.    Follow Up Recommendations No PT follow up    Equipment Recommendations  None recommended by PT    Recommendations for Other Services       Precautions / Restrictions Precautions Precautions: Fall Precaution Comments: low fall risk Restrictions Weight Bearing Restrictions: No      Mobility  Bed Mobility Overal bed mobility: Modified Independent             General bed mobility comments: Pt able to transition supine <> sit with use of bed control but no rails to simulate home set-up.    Transfers Overall transfer level: Needs assistance Equipment used:  Rolling walker (2 wheeled) Transfers: Sit to/from Stand Sit to Stand: Supervision         General transfer comment: Cues for hand placement on bed as she tried to pull up on RW. Supervision for safety, no overt LOB.  Ambulation/Gait Ambulation/Gait assistance: Supervision Gait Distance (Feet): 30 Feet Assistive device: Rolling walker (2 wheeled) Gait Pattern/deviations: WFL(Within Functional Limits) Gait velocity: WFL Gait velocity interpretation: 1.31 - 2.62 ft/sec, indicative of limited community ambulator General Gait Details: Ambulates with symmetrical step length and kyphotic posture, remaining within RW throughout. Supervision for safety, no overt LOB.  Stairs            Wheelchair Mobility    Modified Rankin (Stroke Patients Only)       Balance Overall balance assessment: Mild deficits observed, not formally tested                                           Pertinent Vitals/Pain Pain Assessment: Faces Faces Pain Scale: Hurts a little bit Pain Location: Lower back Pain Descriptors / Indicators: Guarding;Discomfort Pain Intervention(s): Limited activity within patient's tolerance;Monitored during session;Repositioned    Home Living Family/patient expects to be discharged to:: Private residence Living Arrangements: Spouse/significant other;Parent (husband and mom) Available Help at Discharge: Family;Available 24 hours/day Type of Home: House Home Access: Stairs to enter Entrance Stairs-Rails: None Entrance Stairs-Number of Steps: 1 Home Layout: One level Home Equipment: Walker - 2 wheels;Walker -  4 wheels;Cane - quad;Shower seat - built in;Toilet riser;Hand held shower head;Transport chair;Hospital bed (no rails on hospital bed)      Prior Function Level of Independence: Needs assistance   Gait / Transfers Assistance Needed: Requires assistance from husband for stairs and car transfers, otherwise independent with rollator for all other  mobility.  ADL's / Homemaking Assistance Needed: Pt does not clean, cook, or drive. Independent with all personal hygiene, bathing, and dressing.        Hand Dominance        Extremity/Trunk Assessment   Upper Extremity Assessment Upper Extremity Assessment: Defer to OT evaluation    Lower Extremity Assessment Lower Extremity Assessment: Overall WFL for tasks assessed (Sensation to light touch, coordination, and dynamic proprioception intact; MMT scores bilat = knee extension 5, knee flexion 4, ankle dorsiflexion 5 but hip flexion 4 on L and 4- on R)    Cervical / Trunk Assessment Cervical / Trunk Assessment: Kyphotic  Communication   Communication: No difficulties  Cognition Arousal/Alertness: Awake/alert Behavior During Therapy: WFL for tasks assessed/performed Overall Cognitive Status: Within Functional Limits for tasks assessed                                 General Comments: A&Ox4. Pt aware of limitations and maintaining safety throughout.      General Comments General comments (skin integrity, edema, etc.): SpO2 >/= 90% throughout with 3L O2 via [place] (same as home)    Exercises     Assessment/Plan    PT Assessment Patent does not need any further PT services  PT Problem List         PT Treatment Interventions      PT Goals (Current goals can be found in the Care Plan section)  Acute Rehab PT Goals Patient Stated Goal: to go home PT Goal Formulation: With patient Time For Goal Achievement: 08/01/20 Potential to Achieve Goals: Good    Frequency     Barriers to discharge        Co-evaluation               AM-PAC PT "6 Clicks" Mobility  Outcome Measure Help needed turning from your back to your side while in a flat bed without using bedrails?: None Help needed moving from lying on your back to sitting on the side of a flat bed without using bedrails?: None Help needed moving to and from a bed to a chair (including a wheelchair)?:  A Little Help needed standing up from a chair using your arms (e.g., wheelchair or bedside chair)?: A Little Help needed to walk in hospital room?: A Little Help needed climbing 3-5 steps with a railing? : A Little 6 Click Score: 20    End of Session Equipment Utilized During Treatment: Gait belt;Oxygen (3L/min via [place]) Activity Tolerance: Patient limited by fatigue;Other (comment) (limited by diarrhea) Patient left: in bed;with call bell/phone within reach;with nursing/sitter in room Nurse Communication: Mobility status PT Visit Diagnosis: Muscle weakness (generalized) (M62.81)    Time: 9678-9381 PT Time Calculation (min) (ACUTE ONLY): 22 min   Charges:   PT Evaluation $PT Eval Low Complexity: 1 Low          Moishe Spice, PT, DPT Acute Rehabilitation Services  Pager: [phone] Office: [place] 07/30/2020, 5:57 PM

## 2020-07-30 NOTE — Progress Notes (Signed)
Mobility Specialist: Progress Note   07/30/20 1710  Mobility  Activity Ambulated to bathroom  Level of Assistance Moderate assist, patient does 50-74%  Assistive Device Front wheel walker  Distance Ambulated (ft) 30 ft  Mobility Response Tolerated well  Mobility performed by Mobility specialist  $Mobility charge 1 Mobility   Pre-Mobility on 2 L/min [place]: 97% SpO2 During Mobility on 2 L/min [place]: 93% SpO2 Post-Mobility on 2 L/min [place]: 95% SpO2  Assisted RN in checking pt's sats while ambulating to BR. Pt sats as shown above. Pt was minA to modA to stand from EOB. Pt back to bed with RN present in room.   [facility] Lashika Erker Mobility Specialist

## 2020-07-30 NOTE — ED Notes (Signed)
Guaiac +, Dr. Benny Lennert notified.

## 2020-07-30 NOTE — ED Notes (Signed)
Pt resting comfortably. Respirations even and unlabored. Pt updated on plan of care.

## 2020-07-30 NOTE — Progress Notes (Addendum)
Ambulated pt to bathroom, 50 ft, shortness of breath experience.  SpO2 94 % on 2L.  After rest sitting 96 % on 3L [place].

## 2020-07-31 ENCOUNTER — Inpatient Hospital Stay (HOSPITAL_COMMUNITY): Payer: Medicare Other

## 2020-07-31 DIAGNOSIS — R0602 Shortness of breath: Secondary | ICD-10-CM | POA: Diagnosis not present

## 2020-07-31 DIAGNOSIS — I5031 Acute diastolic (congestive) heart failure: Secondary | ICD-10-CM | POA: Diagnosis not present

## 2020-07-31 DIAGNOSIS — I5033 Acute on chronic diastolic (congestive) heart failure: Secondary | ICD-10-CM | POA: Diagnosis not present

## 2020-07-31 DIAGNOSIS — K921 Melena: Secondary | ICD-10-CM | POA: Diagnosis not present

## 2020-07-31 DIAGNOSIS — Z6841 Body Mass Index (BMI) 40.0 and over, adult: Secondary | ICD-10-CM | POA: Diagnosis not present

## 2020-07-31 DIAGNOSIS — E1122 Type 2 diabetes mellitus with diabetic chronic kidney disease: Secondary | ICD-10-CM | POA: Diagnosis not present

## 2020-07-31 DIAGNOSIS — N1831 Chronic kidney disease, stage 3a: Secondary | ICD-10-CM | POA: Diagnosis not present

## 2020-07-31 DIAGNOSIS — K254 Chronic or unspecified gastric ulcer with hemorrhage: Secondary | ICD-10-CM | POA: Diagnosis not present

## 2020-07-31 LAB — BASIC METABOLIC PANEL
Anion gap: 16 — ABNORMAL HIGH (ref 5–15)
BUN: 15 mg/dL (ref 8–23)
CO2: 23 mmol/L (ref 22–32)
Calcium: 8.8 mg/dL — ABNORMAL LOW (ref 8.9–10.3)
Chloride: 102 mmol/L (ref 98–111)
Creatinine, Ser: 1.21 mg/dL — ABNORMAL HIGH (ref 0.44–1.00)
GFR, Estimated: 50 mL/min — ABNORMAL LOW (ref >60)
Glucose, Bld: 104 mg/dL — ABNORMAL HIGH (ref 70–99)
Potassium: 4.4 mmol/L (ref 3.5–5.1)
Sodium: 141 mmol/L (ref 135–145)

## 2020-07-31 LAB — ECHOCARDIOGRAM COMPLETE
AR max vel: 1.63 cm2
AV Area VTI: 1.89 cm2
AV Area mean vel: 1.71 cm2
AV Mean grad: 7 mmHg
AV Peak grad: 15.7 mmHg
Ao pk vel: 1.98 m/s
Area-P 1/2: 3.6 cm2
P 1/2 time: 637 msec
S' Lateral: 2.8 cm
Weight: 4038.4 oz

## 2020-07-31 LAB — CBC WITH DIFFERENTIAL/PLATELET
Abs Immature Granulocytes: 0.08 10*3/uL — ABNORMAL HIGH (ref 0.00–0.07)
Basophils Absolute: 0.1 10*3/uL (ref 0.0–0.1)
Basophils Relative: 1 %
Eosinophils Absolute: 0.1 10*3/uL (ref 0.0–0.5)
Eosinophils Relative: 1 %
HCT: 32.8 % — ABNORMAL LOW (ref 36.0–46.0)
Hemoglobin: 10.5 g/dL — ABNORMAL LOW (ref 12.0–15.0)
Immature Granulocytes: 1 %
Lymphocytes Relative: 17 %
Lymphs Abs: 1.7 10*3/uL (ref 0.7–4.0)
MCH: 30.4 pg (ref 26.0–34.0)
MCHC: 32 g/dL (ref 30.0–36.0)
MCV: 95.1 fL (ref 80.0–100.0)
Monocytes Absolute: 0.9 10*3/uL (ref 0.1–1.0)
Monocytes Relative: 9 %
Neutro Abs: 7.3 10*3/uL (ref 1.7–7.7)
Neutrophils Relative %: 71 %
Platelets: 272 10*3/uL (ref 150–400)
RBC: 3.45 MIL/uL — ABNORMAL LOW (ref 3.87–5.11)
RDW: 15.3 % (ref 11.5–15.5)
WBC: 10 10*3/uL (ref 4.0–10.5)
nRBC: 0 % (ref 0.0–0.2)

## 2020-07-31 LAB — GLUCOSE, CAPILLARY
Glucose-Capillary: 109 mg/dL — ABNORMAL HIGH (ref 70–99)
Glucose-Capillary: 100 mg/dL — ABNORMAL HIGH (ref 70–99)
Glucose-Capillary: 96 mg/dL (ref 70–99)
Glucose-Capillary: 83 mg/dL (ref 70–99)

## 2020-07-31 LAB — HEMOGLOBIN AND HEMATOCRIT, BLOOD
HCT: 34 % — ABNORMAL LOW (ref 36.0–46.0)
HCT: 33.5 % — ABNORMAL LOW (ref 36.0–46.0)
HCT: 36.9 % (ref 36.0–46.0)
Hemoglobin: 10.9 g/dL — ABNORMAL LOW (ref 12.0–15.0)
Hemoglobin: 10.8 g/dL — ABNORMAL LOW (ref 12.0–15.0)
Hemoglobin: 12 g/dL (ref 12.0–15.0)

## 2020-07-31 MED ORDER — ONDANSETRON HCL 4 MG/2ML IJ SOLN: 4.0000 mg | Freq: Four times a day (QID) | INTRAMUSCULAR | Status: DC | PRN

## 2020-07-31 MED ORDER — PANTOPRAZOLE SODIUM 40 MG IV SOLR
40.0000 mg | Freq: Two times a day (BID) | INTRAVENOUS | Status: DC
  Administered 2020-07-31 (×2): 40 mg via INTRAVENOUS
  Filled 2020-07-31: qty 40

## 2020-07-31 NOTE — Progress Notes (Signed)
Patient back to bed after using commode. Patient refused to go back to chair. Educated patient on why she should stay in chair after using the bathroom. Patient still denied wanting to sit in chair and insisted on bed. Verlon Au, RN, BSN 10:06 AM 07/31/2020

## 2020-07-31 NOTE — Progress Notes (Signed)
  Echocardiogram 2D Echocardiogram has been performed.  Theresa Fernandez 07/31/2020, 9:10 AM

## 2020-07-31 NOTE — Consult Note (Addendum)
Referring Provider:  [facility]         Primary Care Physician:  [place] Primary Gastroenterologist:  Althia Forts           We were asked to see this patient for:   melena               ASSESSMENT / PLAN:   # 65 yo female with complaints of black runny stools. Stools heme positive in setting of NSAIDs ( Naprosyn).  On iron at home but until Thursday had never paid attention as to whether it turned stools black.  Hgb slightly lower than baseline but stable --Continue PPI currently getting continuous infusion which is fine she she had a black stool today --trend hgb --Will give clears today,  NPO for EGD tomorrow am. The risks and benefits of EGD were discussed and the patient agrees to proceed.    # [place] anemia. Baseline hgb 11-12, currently stable at 10.8.   # COPD, on home 02.   # Acute on chronic CHF. She had missed a week of home lasix. Getting IV lasix      HPI:                                                                                                                             Chief Complaint: black stools  Keleigh Stbernard is a 65 y.o. female COPD, OSA, CKD 3, fibromyalgia, chronic pain, diabetes, CHF, HTN , hyperlipidemia, obesity  Patient presented to ED 2 days og with SOB. She was tachypneic but hemodynamically stable and afebrile. She apparently ran out of lasix at home. BNP 158. CXR  Remarkable for cardiomegaly and mild pulmonary venous congestion. Her hgb was at baseline in mid 10 range. WBC normal. Cr 1.02, trop 8. BNP 158. Today her liver tests are normal. Creatinine slightly worse at 1.21.   Ms Mccahill began having runny black stools 3 days ago. She takes iron but never looked to see if it turned her stools black. She has had some associated nausea with dry heaves but feels like that is due in part to dry, sticky mouth. No abdominal pain. She takes naprosyn daily, no other NSAIDS. She takes a daily PPI which controls GERD  symptoms.   PREVIOUS ENDOSCOPIC EVALUATIONS / PERTINENT STUDIES   None found.  She gives a history of a normal colonoscopy many years ago but cannot remember where it was done   Past Medical History:  Diagnosis Date  . Allergy   . Anxiety   . Arthritis   . CHF (congestive heart failure) ([place])   . COPD (chronic obstructive pulmonary disease) ([place])   . Depression   . Diabetes mellitus without complication ([place])   . HTN (hypertension) 01/12/2013   pt denies    Past Surgical History:  Procedure Laterality Date  . MASTOIDECTOMY    . TONSILLECTOMY    . TUBAL LIGATION      Prior to Admission  medications   Medication Sig Start Date End Date Taking? Authorizing Provider  albuterol (ACCUNEB) 0.63 MG/3ML nebulizer solution Take 3 mLs (0.63 mg total) by nebulization every 4 (four) hours as needed for wheezing or shortness of breath. 07/17/20  Yes Icard, Bradley L, DO  albuterol (VENTOLIN HFA) 108 (90 Base) MCG/ACT inhaler Inhale 2 puffs into the lungs every 6 (six) hours as needed for wheezing or shortness of breath (wheezing). 07/17/20  Yes Icard, Octavio Graves, DO  ALPRAZolam (XANAX) 0.5 MG tablet Take 0.5 mg by mouth at bedtime.  11/28/19  Yes [provider]  busPIRone (BUSPAR) 15 MG tablet Take 30 mg by mouth at bedtime.    Yes [provider]  carisoprodol (SOMA) 250 MG tablet Take 250 mg by mouth See admin instructions. Take one tablet (250 mg) by mouth twice daily - 7pm and midnight 11/27/19  Yes [provider]  HYDROcodone-acetaminophen (NORCO) 10-325 MG tablet Take 1 tablet by mouth every 6 (six) hours as needed (pain).    Yes [provider]  hydrOXYzine (ATARAX/VISTARIL) 25 MG tablet Take 25 mg by mouth at bedtime.  04/21/18  Yes [provider]  lamoTRIgine (LAMICTAL) 25 MG tablet Take 25 mg by mouth at bedtime.  04/21/18  Yes [provider]  Lancets (FREESTYLE) lancets 1 each daily. 02/20/20  Yes [provider]   lisinopril (PRINIVIL,ZESTRIL) 2.5 MG tablet Take 2.5 mg by mouth daily. For kidney function   Yes [provider]  lovastatin (MEVACOR) 40 MG tablet Take 80 mg by mouth daily. 03/18/20  Yes [provider]  Magnesium 500 MG TABS Take 500-1,000 mg by mouth See admin instructions. Take 1 tablet (500 mg) by mouth every morning and 2 tablets (1000 mg) at night   Yes [provider]  omeprazole (PRILOSEC) 40 MG capsule Take 40 mg by mouth daily.   Yes [provider]  ondansetron (ZOFRAN) 8 MG tablet Take 1 tablet (8 mg total) by mouth every 8 (eight) hours as needed for nausea or vomiting. 01/01/20  Yes Daleen Bo, MD  pioglitazone (ACTOS) 15 MG tablet Take 15 mg by mouth daily. 09/13/19  Yes [provider]  prazosin (MINIPRESS) 5 MG capsule Take 5 mg by mouth at bedtime. 12/04/19  Yes [provider]  predniSONE (DELTASONE) 20 MG tablet Take 20 mg by mouth 2 (two) times daily. 03/18/20  Yes [provider]  pregabalin (LYRICA) 150 MG capsule Take 1 capsule (150 mg total) by mouth at bedtime. Patient taking differently: Take 100 mg by mouth 2 (two) times daily. 02/11/13  Yes Johnson, Clanford L, MD  sertraline (ZOLOFT) 100 MG tablet Take 200 mg by mouth at bedtime.    Yes [provider]  Tiotropium Bromide-Olodaterol (STIOLTO RESPIMAT) 2.5-2.5 MCG/ACT AERS Inhale 2 puffs into the lungs daily. 07/17/20  Yes Icard, Bradley L, DO  Tiotropium Bromide-Olodaterol 2.5-2.5 MCG/ACT AERS Inhale 2 puffs into the lungs daily. 07/17/20  Yes Icard, Octavio Graves, DO  Vitamin D, Ergocalciferol, (DRISDOL) 1.25 MG (50000 UNIT) CAPS capsule Take 50,000 Units by mouth every Tuesday.   Yes [provider]  amphetamine-dextroamphetamine (ADDERALL) 20 MG tablet Take 1 tablet (20 mg total) by mouth 3 (three) times daily. Patient not taking: Reported on 07/29/2020 01/12/13   Theodis Blaze, MD  furosemide (LASIX) 40 MG tablet Take 1 tablet (40 mg total)  by mouth 2 (two) times daily. Patient not taking: Reported on 07/29/2020 10/19/18   [name], MD  metolazone (ZAROXOLYN)  2.5 MG tablet Take 1 tablet (2.5 mg total) by mouth every other day. As needed for weight gain of 2 lbs 10/23/18 04/25/20  [name], MD  budesonide-formoterol (SYMBICORT) 80-4.5 MCG/ACT inhaler Inhale 2 puffs into the lungs 2 (two) times daily. Patient not taking: No sig reported 07/20/18 11/23/19  Martyn Ehrich, NP    Current Facility-Administered Medications  Medication Dose Route Frequency Provider Last Rate Last Admin  . 0.9 %  sodium chloride infusion  250 mL Intravenous PRN Toy Baker, MD      . acetaminophen (TYLENOL) tablet 650 mg  650 mg Oral Q6H PRN Doutova, Anastassia, MD       Or  . acetaminophen (TYLENOL) suppository 650 mg  650 mg Rectal Q6H PRN Doutova, Anastassia, MD      . albuterol (PROVENTIL) (2.5 MG/3ML) 0.083% nebulizer solution 2.5 mg  2.5 mg Nebulization Q4H PRN Doutova, Anastassia, MD      . ALPRAZolam Duanne Moron) tablet 0.5 mg  0.5 mg Oral QHS Doutova, Anastassia, MD   0.5 mg at 07/30/20 2150  . arformoterol (BROVANA) nebulizer solution 15 mcg  15 mcg Nebulization BID Doutova, Anastassia, MD   15 mcg at 07/31/20 1016  . busPIRone (BUSPAR) tablet 30 mg  30 mg Oral QHS Doutova, Anastassia, MD   30 mg at 07/30/20 2150  . carisoprodol (SOMA) tablet 350 mg  350 mg Oral BID Doutova, Anastassia, MD   350 mg at 07/31/20 1016  . furosemide (LASIX) injection 40 mg  40 mg Intravenous Q12H Toy Baker, MD   40 mg at 07/31/20 [id number]  . HYDROcodone-acetaminophen (NORCO/VICODIN) 5-325 MG per tablet 1-2 tablet  1-2 tablet Oral Q4H PRN Doutova, Anastassia, MD      . insulin aspart (novoLOG) injection 0-5 Units  0-5 Units Subcutaneous QHS Doutova, Anastassia, MD      . insulin aspart (novoLOG) injection 0-9 Units  0-9 Units Subcutaneous TID WC Doutova, Anastassia, MD      . lamoTRIgine (LAMICTAL) tablet 25 mg  25 mg Oral QHS Doutova, Anastassia,  MD   25 mg at 07/30/20 2150  . ondansetron (ZOFRAN) injection 4 mg  4 mg Intravenous Q6H PRN Swayze, Ava, DO      . pantoprazole (PROTONIX) 80 mg in sodium chloride 0.9 % 100 mL (0.8 mg/mL) infusion  8 mg/hr Intravenous Continuous Swayze, Ava, DO 10 mL/hr at 07/31/20 0538 8 mg/hr at 07/31/20 0538  . [START ON 08/03/2020] pantoprazole (PROTONIX) injection 40 mg  40 mg Intravenous Q12H Swayze, Ava, DO      . pravastatin (PRAVACHOL) tablet 80 mg  80 mg Oral q1800 Doutova, Anastassia, MD   80 mg at 07/30/20 1843  . prazosin (MINIPRESS) capsule 5 mg  5 mg Oral QHS Doutova, Anastassia, MD   5 mg at 07/30/20 2151  . sertraline (ZOLOFT) tablet 200 mg  200 mg Oral QHS Doutova, Anastassia, MD   200 mg at 07/30/20 2150  . sodium chloride flush (NS) 0.9 % injection 3 mL  3 mL Intravenous Q12H Toy Baker, MD   3 mL at 07/30/20 2154  . sodium chloride flush (NS) 0.9 % injection 3 mL  3 mL Intravenous PRN Toy Baker, MD        Allergies as of 07/29/2020 - Review Complete 07/29/2020  Allergen Reaction Noted  . Ceftriaxone Hives and Rash 06/07/2018  . Topamax [topiramate] Other (See Comments) 01/12/2013  . Voltaren [diclofenac sodium] Other (See Comments) 06/06/2018    Family History  Problem Relation Age of Onset  .  Diabetes Mother   . Alcohol abuse Father   . Diabetes Father   . Stroke Brother     Social History   Socioeconomic History  . Marital status: Married    Spouse name: Not on file  . Number of children: Not on file  . Years of education: Not on file  . Highest education level: Not on file  Occupational History  . Not on file  Tobacco Use  . Smoking status: Former Smoker    Packs/day: 3.00    Years: 50.00    Pack years: 150.00    Types: Cigarettes    Quit date: 05/30/2018    Years since quitting: 2.1  . Smokeless tobacco: Never Used  Vaping Use  . Vaping Use: Never used  Substance and Sexual Activity  . Alcohol use: No  . Drug use: No  . Sexual activity: Not  on file  Other Topics Concern  . Not on file  Social History Narrative  . Not on file   Social Determinants of Health   Financial Resource Strain: Not on file  Food Insecurity: Not on file  Transportation Needs: Not on file  Physical Activity: Not on file  Stress: Not on file  Social Connections: Not on file  Intimate Partner Violence: Not on file    Review of Systems: Positive for hot and cold flashes, cough, SOB, leg swelling, headaches. All other systems reviewed and negative except where noted in HPI.    OBJECTIVE:    Physical Exam: Vital signs in last 24 hours: Temp:  [98 F (36.7 C)-98.7 F (37.1 C)] 98.2 F (36.8 C) (01/10 0727) Pulse Rate:  [60-98] 88 (01/10 0727) Resp:  [13-20] 17 (01/10 0727) BP: (99-130)/(58-88) 125/58 (01/10 0727) SpO2:  [94 %-100 %] 95 % (01/10 0727) Weight:  [114.5 kg] 114.5 kg (01/10 0452) Last BM Date: 07/30/20 General:   Alert  obese female in NAD Psych:  Pleasant, cooperative. Normal mood and affect. Eyes:  Pupils equal, sclera clear, no icterus.   Conjunctiva pink. Ears:  Normal auditory acuity. Nose:  No deformity, discharge,  or lesions. Neck:  Supple Lungs:  Clear throughout to auscultation.     Heart:  Regular rate and rhythm;  no lower extremity edema Abdomen:  Soft, non-distended, nontender, BS active, no palp mass   Rectal: hemorrhoidal tags. No stool in vault.  Msk:  Symmetrical without gross deformities. . Neurologic:  Alert and  oriented x4;  grossly normal neurologically. Skin:  Intact without significant lesions or rashes.  Filed Weights   07/31/20 0452  Weight: 114.5 kg     Scheduled inpatient medications . ALPRAZolam  0.5 mg Oral QHS  . arformoterol  15 mcg Nebulization BID  . busPIRone  30 mg Oral QHS  . carisoprodol  350 mg Oral BID  . furosemide  40 mg Intravenous Q12H  . insulin aspart  0-5 Units Subcutaneous QHS  . insulin aspart  0-9 Units Subcutaneous TID WC  . lamoTRIgine  25 mg Oral QHS  .  [START ON 08/03/2020] pantoprazole  40 mg Intravenous Q12H  . pravastatin  80 mg Oral q1800  . prazosin  5 mg Oral QHS  . sertraline  200 mg Oral QHS  . sodium chloride flush  3 mL Intravenous Q12H      Intake/Output from previous day: 01/09 0701 - 01/10 0700 In: 120 [P.O.:120] Out: 953 [Urine:950; Stool:3] Intake/Output this shift: No intake/output data recorded.   Lab Results: Recent Labs    07/29/20 1245  07/29/20 1951 07/30/20 0339 07/30/20 1542 07/30/20 2307 07/31/20 0201 07/31/20 0800  WBC 8.0  --  10.5  --   --  10.0  --   HGB 10.0*   < > 10.4*   < > 10.7* 10.5* 10.9*  HCT 32.7*   < > 34.8*   < > 34.8* 32.8* 34.0*  PLT 261  --  265  --   --  272  --    < > = values in this interval not displayed.   BMET Recent Labs    07/29/20 1245 07/29/20 1951 07/30/20 0339 07/31/20 0201  NA 143 144 145 141  K 4.3 5.2* 3.8 4.4  CL 105  --  107 102  CO2 26  --  25 23  GLUCOSE 119*  --  119* 104*  BUN 11  --  12 15  CREATININE 1.02*  --  1.20* 1.21*  CALCIUM 9.0  --  9.2 8.8*   LFT Recent Labs    07/30/20 0339  PROT 6.7  ALBUMIN 3.4*  AST 25  ALT 16  ALKPHOS 91  BILITOT 0.7   PT/INR No results for input(s): LABPROT, INR in the last 72 hours. Hepatitis Panel No results for input(s): HEPBSAG, HCVAB, HEPAIGM, HEPBIGM in the last 72 hours.   . CBC Latest Ref Rng & Units 07/31/2020 07/31/2020 07/30/2020  WBC 4.0 - 10.5 K/uL - 10.0 -  Hemoglobin 12.0 - 15.0 g/dL 10.9(L) 10.5(L) 10.7(L)  Hematocrit 36.0 - 46.0 % 34.0(L) 32.8(L) 34.8(L)  Platelets 150 - 400 K/uL - 272 -    . CMP Latest Ref Rng & Units 07/31/2020 07/30/2020 07/29/2020  Glucose 70 - 99 mg/dL 104(H) 119(H) -  BUN 8 - 23 mg/dL 15 12 -  Creatinine 0.44 - 1.00 mg/dL 1.21(H) 1.20(H) -  Sodium 135 - 145 mmol/L 141 145 144  Potassium 3.5 - 5.1 mmol/L 4.4 3.8 5.2(H)  Chloride 98 - 111 mmol/L 102 107 -  CO2 22 - 32 mmol/L 23 25 -  Calcium 8.9 - 10.3 mg/dL 8.8(L) 9.2 -  Total Protein 6.5 - 8.1 g/dL - 6.7 -   Total Bilirubin 0.3 - 1.2 mg/dL - 0.7 -  Alkaline Phos 38 - 126 U/L - 91 -  AST 15 - 41 U/L - 25 -  ALT 0 - 44 U/L - 16 -   Studies/Results: DG Chest 2 View  Result Date: 07/29/2020 CLINICAL DATA:  CHF exacerbation.  Pain. EXAM: CHEST - 2 VIEW COMPARISON:  None FINDINGS: Cardiomegaly and pulmonary venous congestion/mild edema. No pneumothorax. No nodules, masses, or focal pulmonary infiltrates. The hila and mediastinum are normal. Previous kyphoplasties at multiple levels in the thoracic spine. IMPRESSION: Cardiomegaly and mild pulmonary venous congestion/mild edema. Electronically Signed   By: David  Williams III M.D   On: 07/29/2020 12:56   ECHOCARDIOGRAM COMPLETE  Result Date: 07/31/2020    ECHOCARDIOGRAM REPORT   Patient Name:   Theresa Fernandez Date of Exam: 07/31/2020 Medical Rec #:  [id number]       Height:       65.0 in Accession #:    [id number]      Weight:       252.4 lb Date of Birth:  04/26/1956       BSA:          2.184 m Patient Age:    64 years        BP:           99/61 mmHg Patient Gender: F                 HR:           98 bpm. Exam Location:  Inpatient Procedure: 2D Echo, Cardiac Doppler and Color Doppler Indications:     CHF-Acute Diastolic  History:         Patient has prior history of Echocardiogram examinations, most                  recent 06/08/2018. CHF; Risk Factors:Hypertension,                  Dyslipidemia, Diabetes and Sleep Apnea. CKD.  Sonographer:     Clayton Lefort RDCS (AE) Referring Phys:  Geryl Rankins DOUTOVA Diagnosing Phys: Fransico Him MD  Sonographer Comments: No subcostal window. Bright ambient room light even with shades pulled. IMPRESSIONS  1. Left ventricular ejection fraction, by estimation, is 65 to 70%. The left ventricle has normal function. The left ventricle has no regional wall motion abnormalities. There is moderate concentric left ventricular hypertrophy. Left ventricular diastolic parameters are consistent with Grade I diastolic dysfunction (impaired  relaxation).  2. Right ventricular systolic function is normal. The right ventricular size is normal. Tricuspid regurgitation signal is inadequate for assessing PA pressure.  3. The mitral valve is normal in structure. No evidence of mitral valve regurgitation. No evidence of mitral stenosis.  4. The aortic valve is tricuspid. Aortic valve regurgitation is mild. Mild aortic valve sclerosis is present, with no evidence of aortic valve stenosis. Aortic regurgitation PHT measures 637 msec. Aortic valve area, by VTI measures 1.89 cm. Aortic valve mean gradient measures 7.0 mmHg. Aortic valve Vmax measures 1.98 m/s. FINDINGS  Left Ventricle: Left ventricular ejection fraction, by estimation, is 65 to 70%. The left ventricle has normal function. The left ventricle has no regional wall motion abnormalities. The left ventricular internal cavity size was normal in size. There is  moderate concentric left ventricular hypertrophy. Left ventricular diastolic parameters are consistent with Grade I diastolic dysfunction (impaired relaxation). Normal left ventricular filling pressure. Right Ventricle: The right ventricular size is normal. No increase in right ventricular wall thickness. Right ventricular systolic function is normal. Tricuspid regurgitation signal is inadequate for assessing PA pressure. Left Atrium: Left atrial size was normal in size. Right Atrium: Right atrial size was normal in size. Pericardium: There is no evidence of pericardial effusion. Mitral Valve: The mitral valve is normal in structure. Mild to moderate mitral annular calcification. No evidence of mitral valve regurgitation. No evidence of mitral valve stenosis. Tricuspid Valve: The tricuspid valve is normal in structure. Tricuspid valve regurgitation is not demonstrated. No evidence of tricuspid stenosis. Aortic Valve: The aortic valve is tricuspid. Aortic valve regurgitation is mild. Aortic regurgitation PHT measures 637 msec. Mild aortic valve  sclerosis is present, with no evidence of aortic valve stenosis. Aortic valve mean gradient measures 7.0 mmHg. Aortic valve peak gradient measures 15.7 mmHg. Aortic valve area, by VTI measures 1.89 cm. Pulmonic Valve: The pulmonic valve was normal in structure. Pulmonic valve regurgitation is not visualized. No evidence of pulmonic stenosis. Aorta: The aortic root is normal in size and structure. Venous: The inferior vena cava was not well visualized. IAS/Shunts: No atrial level shunt detected by color flow Doppler.  LEFT VENTRICLE PLAX 2D LVIDd:         4.10 cm  Diastology LVIDs:         2.80 cm  LV e' medial:    5.11 cm/s LV PW:         1.40 cm  LV E/e' medial:  15.0 LV IVS:        1.40 cm  LV e' lateral:   7.83 cm/s LVOT diam:     1.80 cm  LV E/e' lateral: 9.8 LV SV:         64 LV SV Index:   29 LVOT Area:     2.54 cm  RIGHT VENTRICLE RV Basal diam:  3.30 cm RV S prime:     16.10 cm/s TAPSE (M-mode): 1.9 cm LEFT ATRIUM             Index       RIGHT ATRIUM           Index LA diam:        4.10 cm 1.88 cm/m  RA Area:     17.90 cm LA Vol (A2C):   50.0 ml 22.90 ml/m RA Volume:   48.80 ml  22.35 ml/m LA Vol (A4C):   38.9 ml 17.81 ml/m LA Biplane Vol: 46.9 ml 21.48 ml/m  AORTIC VALVE AV Area (Vmax):    1.63 cm AV Area (Vmean):   1.71 cm AV Area (VTI):     1.89 cm AV Vmax:           198.00 cm/s AV Vmean:          121.000 cm/s AV VTI:            0.340 m AV Peak Grad:      15.7 mmHg AV Mean Grad:      7.0 mmHg LVOT Vmax:         127.00 cm/s LVOT Vmean:        81.500 cm/s LVOT VTI:          0.253 m LVOT/AV VTI ratio: 0.74 AI PHT:            637 msec  AORTA Ao Root diam: 3.60 cm Ao Asc diam:  3.60 cm MITRAL VALVE MV Area (PHT): 3.60 cm     SHUNTS MV Decel Time: 211 msec     Systemic VTI:  0.25 m MV E velocity: 76.50 cm/s   Systemic Diam: 1.80 cm MV A velocity: 104.00 cm/s MV E/A ratio:  0.74 Fransico Him MD Electronically signed by Fransico Him MD Signature Date/Time: 07/31/2020/9:36:31 AM    Final (Updated)      Tye Savoy, NP-C @  07/31/2020, 10:51 AM  ________________________________________________________________________  Velora Heckler GI MD note:  I personally examined the patient, reviewed the data and agree with the assessment and plan described above.  She has had dark stools for 2-3 days.  TAkes two naproxen once daily for years.  Had a large volume nose bleed 2-3 days ago and just started having signficant nose bleed again this afternoon. She is going through tissue after tissue. RN aware and is getting in touch with the hospitalist.   Her dark stools and acute on chronic anemic may certainly be from her recurrent epistaxis.  I recommended we still plan on EGD tomorrow to be safe that there are no GI sources of her melena, anemia.  Given that the probability is a bit less now that we know about her epistaxis, I think it is safe to d/c the PPI drip and change to IV BID BID for now.  Owens Loffler, MD [facility] Pager [phone]

## 2020-07-31 NOTE — Progress Notes (Signed)
Mobility Specialist: Progress Note   07/31/20 1608  Mobility  Activity Transferred to/from [facility]  Level of Assistance  (Hand held assist)  Assistive Device [facility]  Mobility Response Tolerated well  Mobility performed by Mobility specialist  $Mobility charge 1 Mobility   During Mobility: 100 HR, 133/98 BP, 98% SpO2  Pt requesting to use BSC upon entering room. Pt was hand held assist to stand and pivot to [facility]. Pt noted to be lethargic and states she is freezing, RN notified. Pt back to bed after pericare.   [facility] Esthefany Herrig Mobility Specialist

## 2020-07-31 NOTE — Consult Note (Signed)
Referring Provider:  [facility]         Primary Care Physician:  Center, [facility] Primary Gastroenterologist: None prior.            We were asked to see this patient for:  melena and diarrhea.               ASSESSMENT / PLAN:   #Melena with loose black stools, on iron, heme positive. Rule out peptic ulcer disease in the setting of daily NSAIDs, r/o erosive esophagitis, r/o intestinal AVMs -Continue PPI (IV Protonix) -EGD for further evaluation which is scheduled on 08/01/20 -Okay for clear liquid diet today, NPO midnight 08/01/20 -Hgb stable at 10.7  #Normocytic Anemic, Chronic -Hgb stable at 10.7 in the setting of GI bleed, continue to monitor for progression of anemia  #Acute on Chronic Respiratory Failure -2L [place] at home, now on 4L [place], SpO2 94 % -CXR show cardiomegaly and mild pulmonary venous congestion/mild edema. -Undergoing diuresis, IV Lasix 40 mg BID  #CHF, acute diastolic heart failure -Undergoing diuresis, IV Lasix 40 mg BID -Echo 07/31/20, EF [id number], grade 1 diastolic dysfunction  #DM2 - home oral diabetic agents on hold      HPI:                                                                                                                             Chief Complaint: black diarrhea.   Theresa Fernandez is a 65 y.o. female with medical history significant for DM2, CHF, and COPD on 2 L oxygen at home.  Presented to the ED with shortness of breath, despite being on her home oxygen of 2 L, requiring 4 L of oxygen to maintain SaO2 of 90 %.  Patient takes Lasix at home but reports running out of her home medications for the past 3 days.  Patient reports severe shortness of breath and dyspnea on exertion.  Patient describes black (on home iron) loose stools for four days, her hemoglobin 10.9. Guaiac positive. Patient takes naproxen sodium at home several times a month.  Patient denies vomiting or abdominal pain but does have nausea with dry heaves.  COVID  negative (07/30/20).     PREVIOUS ENDOSCOPIC EVALUATIONS / PERTINENT STUDIES   No documentation of previous endoscopies.  Past Medical History:  Diagnosis Date  . Allergy   . Anxiety   . Arthritis   . CHF (congestive heart failure) ([place])   . COPD (chronic obstructive pulmonary disease) ([place])   . Depression   . Diabetes mellitus without complication ([place])   . HTN (hypertension) 01/12/2013   pt denies    Past Surgical History:  Procedure Laterality Date  . MASTOIDECTOMY    . TONSILLECTOMY    . TUBAL LIGATION      Prior to Admission medications   Medication Sig Start Date End Date Taking? Authorizing Provider  albuterol (ACCUNEB) 0.63 MG/3ML nebulizer solution Take 3 mLs (0.63 mg total) by nebulization every 4 (  four) hours as needed for wheezing or shortness of breath. 07/17/20  Yes Icard, Bradley L, DO  albuterol (VENTOLIN HFA) 108 (90 Base) MCG/ACT inhaler Inhale 2 puffs into the lungs every 6 (six) hours as needed for wheezing or shortness of breath (wheezing). 07/17/20  Yes Icard, Octavio Graves, DO  ALPRAZolam (XANAX) 0.5 MG tablet Take 0.5 mg by mouth at bedtime.  11/28/19  Yes [provider]  busPIRone (BUSPAR) 15 MG tablet Take 30 mg by mouth at bedtime.    Yes [provider]  carisoprodol (SOMA) 250 MG tablet Take 250 mg by mouth See admin instructions. Take one tablet (250 mg) by mouth twice daily - 7pm and midnight 11/27/19  Yes [provider]  HYDROcodone-acetaminophen (NORCO) 10-325 MG tablet Take 1 tablet by mouth every 6 (six) hours as needed (pain).    Yes [provider]  hydrOXYzine (ATARAX/VISTARIL) 25 MG tablet Take 25 mg by mouth at bedtime.  04/21/18  Yes [provider]  lamoTRIgine (LAMICTAL) 25 MG tablet Take 25 mg by mouth at bedtime.  04/21/18  Yes [provider]  Lancets (FREESTYLE) lancets 1 each daily. 02/20/20  Yes [provider]  lisinopril (PRINIVIL,ZESTRIL) 2.5 MG tablet Take 2.5 mg by mouth  daily. For kidney function   Yes [provider]  lovastatin (MEVACOR) 40 MG tablet Take 80 mg by mouth daily. 03/18/20  Yes [provider]  Magnesium 500 MG TABS Take 500-1,000 mg by mouth See admin instructions. Take 1 tablet (500 mg) by mouth every morning and 2 tablets (1000 mg) at night   Yes [provider]  omeprazole (PRILOSEC) 40 MG capsule Take 40 mg by mouth daily.   Yes [provider]  ondansetron (ZOFRAN) 8 MG tablet Take 1 tablet (8 mg total) by mouth every 8 (eight) hours as needed for nausea or vomiting. 01/01/20  Yes Daleen Bo, MD  pioglitazone (ACTOS) 15 MG tablet Take 15 mg by mouth daily. 09/13/19  Yes [provider]  prazosin (MINIPRESS) 5 MG capsule Take 5 mg by mouth at bedtime. 12/04/19  Yes [provider]  predniSONE (DELTASONE) 20 MG tablet Take 20 mg by mouth 2 (two) times daily. 03/18/20  Yes [provider]  pregabalin (LYRICA) 150 MG capsule Take 1 capsule (150 mg total) by mouth at bedtime. Patient taking differently: Take 100 mg by mouth 2 (two) times daily. 02/11/13  Yes Johnson, Clanford L, MD  sertraline (ZOLOFT) 100 MG tablet Take 200 mg by mouth at bedtime.    Yes [provider]  Tiotropium Bromide-Olodaterol (STIOLTO RESPIMAT) 2.5-2.5 MCG/ACT AERS Inhale 2 puffs into the lungs daily. 07/17/20  Yes Icard, Bradley L, DO  Tiotropium Bromide-Olodaterol 2.5-2.5 MCG/ACT AERS Inhale 2 puffs into the lungs daily. 07/17/20  Yes Icard, Octavio Graves, DO  Vitamin D, Ergocalciferol, (DRISDOL) 1.25 MG (50000 UNIT) CAPS capsule Take 50,000 Units by mouth every Tuesday.   Yes [provider]  amphetamine-dextroamphetamine (ADDERALL) 20 MG tablet Take 1 tablet (20 mg total) by mouth 3 (three) times daily. Patient not taking: Reported on 07/29/2020 01/12/13   Theodis Blaze, MD  furosemide (LASIX) 40 MG tablet Take 1 tablet (40 mg total) by mouth 2 (two) times daily. Patient not taking: Reported on  07/29/2020 10/19/18   [name], MD  metolazone (ZAROXOLYN) 2.5 MG tablet Take 1 tablet (2.5 mg total) by mouth every other day. As needed for weight gain of 2 lbs 10/23/18 04/25/20  Hilty, Nadean Corwin, MD  budesonide-formoterol (SYMBICORT) 80-4.5 MCG/ACT inhaler Inhale 2 puffs into the lungs 2 (two) times daily. Patient not taking: No sig reported 07/20/18 11/23/19  Martyn Ehrich, NP    Current Facility-Administered Medications  Medication Dose Route Frequency Provider Last Rate Last Admin  . 0.9 %  sodium chloride infusion  250 mL Intravenous PRN Toy Baker, MD      . acetaminophen (TYLENOL) tablet 650 mg  650 mg Oral Q6H PRN Doutova, Anastassia, MD       Or  . acetaminophen (TYLENOL) suppository 650 mg  650 mg Rectal Q6H PRN Doutova, Anastassia, MD      . albuterol (PROVENTIL) (2.5 MG/3ML) 0.083% nebulizer solution 2.5 mg  2.5 mg Nebulization Q4H PRN Doutova, Anastassia, MD      . ALPRAZolam Duanne Moron) tablet 0.5 mg  0.5 mg Oral QHS Doutova, Anastassia, MD   0.5 mg at 07/30/20 2150  . arformoterol (BROVANA) nebulizer solution 15 mcg  15 mcg Nebulization BID Doutova, Anastassia, MD   15 mcg at 07/31/20 1016  . busPIRone (BUSPAR) tablet 30 mg  30 mg Oral QHS Doutova, Anastassia, MD   30 mg at 07/30/20 2150  . carisoprodol (SOMA) tablet 350 mg  350 mg Oral BID Doutova, Anastassia, MD   350 mg at 07/31/20 1016  . furosemide (LASIX) injection 40 mg  40 mg Intravenous Q12H Toy Baker, MD   40 mg at 07/31/20 [id number]  . HYDROcodone-acetaminophen (NORCO/VICODIN) 5-325 MG per tablet 1-2 tablet  1-2 tablet Oral Q4H PRN Doutova, Anastassia, MD      . insulin aspart (novoLOG) injection 0-5 Units  0-5 Units Subcutaneous QHS Doutova, Anastassia, MD      . insulin aspart (novoLOG) injection 0-9 Units  0-9 Units Subcutaneous TID WC Doutova, Anastassia, MD      . lamoTRIgine (LAMICTAL) tablet 25 mg  25 mg Oral QHS Doutova, Anastassia, MD   25 mg at 07/30/20 2150  . ondansetron (ZOFRAN) injection  4 mg  4 mg Intravenous Q6H PRN Swayze, Ava, DO      . pantoprazole (PROTONIX) 80 mg in sodium chloride 0.9 % 100 mL (0.8 mg/mL) infusion  8 mg/hr Intravenous Continuous Swayze, Ava, DO 10 mL/hr at 07/31/20 0538 8 mg/hr at 07/31/20 0538  . [START ON 08/03/2020] pantoprazole (PROTONIX) injection 40 mg  40 mg Intravenous Q12H Swayze, Ava, DO      . pravastatin (PRAVACHOL) tablet 80 mg  80 mg Oral q1800 Doutova, Anastassia, MD   80 mg at 07/30/20 1843  . prazosin (MINIPRESS) capsule 5 mg  5 mg Oral QHS Doutova, Anastassia, MD   5 mg at 07/30/20 2151  . sertraline (ZOLOFT) tablet 200 mg  200 mg Oral QHS Doutova, Anastassia, MD   200 mg at 07/30/20 2150  . sodium chloride flush (NS) 0.9 % injection 3 mL  3 mL Intravenous Q12H Toy Baker, MD   3 mL at 07/30/20 2154  . sodium chloride flush (NS) 0.9 % injection 3 mL  3 mL Intravenous PRN Toy Baker, MD        Allergies as of 07/29/2020 - Review Complete 07/29/2020  Allergen Reaction Noted  . Ceftriaxone Hives and Rash 06/07/2018  . Topamax [topiramate] Other (See Comments) 01/12/2013  . Voltaren [diclofenac sodium] Other (See Comments) 06/06/2018    Family History  Problem Relation Age of Onset  . Diabetes Mother   . Alcohol abuse Father   . Diabetes Father   . Stroke Brother     Social History   Socioeconomic History  .  Marital status: Married    Spouse name: Not on file  . Number of children: Not on file  . Years of education: Not on file  . Highest education level: Not on file  Occupational History  . Not on file  Tobacco Use  . Smoking status: Former Smoker    Packs/day: 3.00    Years: 50.00    Pack years: 150.00    Types: Cigarettes    Quit date: 05/30/2018    Years since quitting: 2.1  . Smokeless tobacco: Never Used  Vaping Use  . Vaping Use: Never used  Substance and Sexual Activity  . Alcohol use: No  . Drug use: No  . Sexual activity: Not on file  Other Topics Concern  . Not on file  Social  History Narrative  . Not on file   Social Determinants of Health   Financial Resource Strain: Not on file  Food Insecurity: Not on file  Transportation Needs: Not on file  Physical Activity: Not on file  Stress: Not on file  Social Connections: Not on file  Intimate Partner Violence: Not on file    Review of Systems: All systems reviewed and negative except where noted in HPI.   OBJECTIVE:    Physical Exam: Vital signs in last 24 hours: Temp:  [98 F (36.7 C)-98.7 F (37.1 C)] 98.2 F (36.8 C) (01/10 0727) Pulse Rate:  [60-98] 88 (01/10 0727) Resp:  [13-20] 17 (01/10 0727) BP: (99-130)/(58-88) 125/58 (01/10 0727) SpO2:  [94 %-100 %] 95 % (01/10 0727) Weight:  [114.5 kg] 114.5 kg (01/10 0452) Last BM Date: 07/30/20 General:   Alert female in NAD Psych:  Pleasant, cooperative. Normal mood and affect. Eyes:  Pupils equal, sclera clear, no icterus.  Conjunctiva pink. Ears:  Normal auditory acuity. Nose:  No deformity, discharge, or lesions. Neck:  Supple; no masses Lungs:  Clear throughout to auscultation.  No wheezes, crackles, or rhonchi.  Heart:  Regular rate and rhythm, no lower extremity edema Abdomen:  Soft, non-distended, nontender, BS active, no palp mass.   Rectal:  No masses or stool present.  External hemorrhoidal tissue.  Msk:  Symmetrical without gross deformities. Neurologic:  Alert and  oriented x4;  grossly normal neurologically. Skin:  Intact without significant lesions or rashes.  Filed Weights   07/31/20 0452  Weight: 114.5 kg     Scheduled inpatient medications . ALPRAZolam  0.5 mg Oral QHS  . arformoterol  15 mcg Nebulization BID  . busPIRone  30 mg Oral QHS  . carisoprodol  350 mg Oral BID  . furosemide  40 mg Intravenous Q12H  . insulin aspart  0-5 Units Subcutaneous QHS  . insulin aspart  0-9 Units Subcutaneous TID WC  . lamoTRIgine  25 mg Oral QHS  . [START ON 08/03/2020] pantoprazole  40 mg Intravenous Q12H  . pravastatin  80 mg Oral  q1800  . prazosin  5 mg Oral QHS  . sertraline  200 mg Oral QHS  . sodium chloride flush  3 mL Intravenous Q12H      Intake/Output from previous day: 01/09 0701 - 01/10 0700 In: 120 [P.O.:120] Out: 953 [Urine:950; Stool:3] Intake/Output this shift: No intake/output data recorded.   Lab Results: Recent Labs    07/29/20 1245 07/29/20 1951 07/30/20 0339 07/30/20 1542 07/30/20 2307 07/31/20 0201 07/31/20 0800  WBC 8.0  --  10.5  --   --  10.0  --   HGB 10.0*   < > 10.4*   < >  10.7* 10.5* 10.9*  HCT 32.7*   < > 34.8*   < > 34.8* 32.8* 34.0*  PLT 261  --  265  --   --  272  --    < > = values in this interval not displayed.   BMET Recent Labs    07/29/20 1245 07/29/20 1951 07/30/20 0339 07/31/20 0201  NA 143 144 145 141  K 4.3 5.2* 3.8 4.4  CL 105  --  107 102  CO2 26  --  25 23  GLUCOSE 119*  --  119* 104*  BUN 11  --  12 15  CREATININE 1.02*  --  1.20* 1.21*  CALCIUM 9.0  --  9.2 8.8*   LFT Recent Labs    07/30/20 0339  PROT 6.7  ALBUMIN 3.4*  AST 25  ALT 16  ALKPHOS 91  BILITOT 0.7   PT/INR No results for input(s): LABPROT, INR in the last 72 hours. Hepatitis Panel No results for input(s): HEPBSAG, HCVAB, HEPAIGM, HEPBIGM in the last 72 hours.   . CBC Latest Ref Rng & Units 07/31/2020 07/31/2020 07/30/2020  WBC 4.0 - 10.5 K/uL - 10.0 -  Hemoglobin 12.0 - 15.0 g/dL 10.9(L) 10.5(L) 10.7(L)  Hematocrit 36.0 - 46.0 % 34.0(L) 32.8(L) 34.8(L)  Platelets 150 - 400 K/uL - 272 -    . CMP Latest Ref Rng & Units 07/31/2020 07/30/2020 07/29/2020  Glucose 70 - 99 mg/dL 104(H) 119(H) -  BUN 8 - 23 mg/dL 15 12 -  Creatinine 0.44 - 1.00 mg/dL 1.21(H) 1.20(H) -  Sodium 135 - 145 mmol/L 141 145 144  Potassium 3.5 - 5.1 mmol/L 4.4 3.8 5.2(H)  Chloride 98 - 111 mmol/L 102 107 -  CO2 22 - 32 mmol/L 23 25 -  Calcium 8.9 - 10.3 mg/dL 8.8(L) 9.2 -  Total Protein 6.5 - 8.1 g/dL - 6.7 -  Total Bilirubin 0.3 - 1.2 mg/dL - 0.7 -  Alkaline Phos 38 - 126 U/L - 91 -  AST 15  - 41 U/L - 25 -  ALT 0 - 44 U/L - 16 -   Studies/Results: DG Chest 2 View  Result Date: 07/29/2020 CLINICAL DATA:  CHF exacerbation.  Pain. EXAM: CHEST - 2 VIEW COMPARISON:  None FINDINGS: Cardiomegaly and pulmonary venous congestion/mild edema. No pneumothorax. No nodules, masses, or focal pulmonary infiltrates. The hila and mediastinum are normal. Previous kyphoplasties at multiple levels in the thoracic spine. IMPRESSION: Cardiomegaly and mild pulmonary venous congestion/mild edema. Electronically Signed   By: Dorise Bullion III M.D   On: 07/29/2020 12:56   ECHOCARDIOGRAM COMPLETE  Result Date: 07/31/2020    ECHOCARDIOGRAM REPORT   Patient Name:   Theresa Fernandez Date of Exam: 07/31/2020 Medical Rec #:  [id number]       Height:       65.0 in Accession #:    [id number]      Weight:       252.4 lb Date of Birth:  [birth year]       BSA:          2.184 m Patient Age:    65 years        BP:           99/61 mmHg Patient Gender: F               HR:           98 bpm. Exam Location:  Inpatient Procedure: 2D Echo, Cardiac Doppler and Color Doppler Indications:  CHF-Acute Diastolic  History:         Patient has prior history of Echocardiogram examinations, most                  recent 06/08/2018. CHF; Risk Factors:Hypertension,                  Dyslipidemia, Diabetes and Sleep Apnea. CKD.  Sonographer:     Clayton Lefort RDCS (AE) Referring Phys:  Geryl Rankins DOUTOVA Diagnosing Phys: Fransico Him MD  Sonographer Comments: No subcostal window. Bright ambient room light even with shades pulled. IMPRESSIONS  1. Left ventricular ejection fraction, by estimation, is 65 to 70%. The left ventricle has normal function. The left ventricle has no regional wall motion abnormalities. There is moderate concentric left ventricular hypertrophy. Left ventricular diastolic parameters are consistent with Grade I diastolic dysfunction (impaired relaxation).  2. Right ventricular systolic function is normal. The right ventricular  size is normal. Tricuspid regurgitation signal is inadequate for assessing PA pressure.  3. The mitral valve is normal in structure. No evidence of mitral valve regurgitation. No evidence of mitral stenosis.  4. The aortic valve is tricuspid. Aortic valve regurgitation is mild. Mild aortic valve sclerosis is present, with no evidence of aortic valve stenosis. Aortic regurgitation PHT measures 637 msec. Aortic valve area, by VTI measures 1.89 cm. Aortic valve mean gradient measures 7.0 mmHg. Aortic valve Vmax measures 1.98 m/s. FINDINGS  Left Ventricle: Left ventricular ejection fraction, by estimation, is 65 to 70%. The left ventricle has normal function. The left ventricle has no regional wall motion abnormalities. The left ventricular internal cavity size was normal in size. There is  moderate concentric left ventricular hypertrophy. Left ventricular diastolic parameters are consistent with Grade I diastolic dysfunction (impaired relaxation). Normal left ventricular filling pressure. Right Ventricle: The right ventricular size is normal. No increase in right ventricular wall thickness. Right ventricular systolic function is normal. Tricuspid regurgitation signal is inadequate for assessing PA pressure. Left Atrium: Left atrial size was normal in size. Right Atrium: Right atrial size was normal in size. Pericardium: There is no evidence of pericardial effusion. Mitral Valve: The mitral valve is normal in structure. Mild to moderate mitral annular calcification. No evidence of mitral valve regurgitation. No evidence of mitral valve stenosis. Tricuspid Valve: The tricuspid valve is normal in structure. Tricuspid valve regurgitation is not demonstrated. No evidence of tricuspid stenosis. Aortic Valve: The aortic valve is tricuspid. Aortic valve regurgitation is mild. Aortic regurgitation PHT measures 637 msec. Mild aortic valve sclerosis is present, with no evidence of aortic valve stenosis. Aortic valve mean  gradient measures 7.0 mmHg. Aortic valve peak gradient measures 15.7 mmHg. Aortic valve area, by VTI measures 1.89 cm. Pulmonic Valve: The pulmonic valve was normal in structure. Pulmonic valve regurgitation is not visualized. No evidence of pulmonic stenosis. Aorta: The aortic root is normal in size and structure. Venous: The inferior vena cava was not well visualized. IAS/Shunts: No atrial level shunt detected by color flow Doppler.  LEFT VENTRICLE PLAX 2D LVIDd:         4.10 cm  Diastology LVIDs:         2.80 cm  LV e' medial:    5.11 cm/s LV PW:         1.40 cm  LV E/e' medial:  15.0 LV IVS:        1.40 cm  LV e' lateral:   7.83 cm/s LVOT diam:     1.80 cm  LV  E/e' lateral: 9.8 LV SV:         64 LV SV Index:   29 LVOT Area:     2.54 cm  RIGHT VENTRICLE RV Basal diam:  3.30 cm RV S prime:     16.10 cm/s TAPSE (M-mode): 1.9 cm LEFT ATRIUM             Index       RIGHT ATRIUM           Index LA diam:        4.10 cm 1.88 cm/m  RA Area:     17.90 cm LA Vol (A2C):   50.0 ml 22.90 ml/m RA Volume:   48.80 ml  22.35 ml/m LA Vol (A4C):   38.9 ml 17.81 ml/m LA Biplane Vol: 46.9 ml 21.48 ml/m  AORTIC VALVE AV Area (Vmax):    1.63 cm AV Area (Vmean):   1.71 cm AV Area (VTI):     1.89 cm AV Vmax:           198.00 cm/s AV Vmean:          121.000 cm/s AV VTI:            0.340 m AV Peak Grad:      15.7 mmHg AV Mean Grad:      7.0 mmHg LVOT Vmax:         127.00 cm/s LVOT Vmean:        81.500 cm/s LVOT VTI:          0.253 m LVOT/AV VTI ratio: 0.74 AI PHT:            637 msec  AORTA Ao Root diam: 3.60 cm Ao Asc diam:  3.60 cm MITRAL VALVE MV Area (PHT): 3.60 cm     SHUNTS MV Decel Time: 211 msec     Systemic VTI:  0.25 m MV E velocity: 76.50 cm/s   Systemic Diam: 1.80 cm MV A velocity: 104.00 cm/s MV E/A ratio:  0.74 Fransico Him MD Electronically signed by Fransico Him MD Signature Date/Time: 07/31/2020/9:36:31 AM    Final (Updated)     Active Problems:   HTN (hypertension)   DM2 (diabetes mellitus, type 2) (HCC)    Dyslipidemia   CKD (chronic kidney disease) stage 3, GFR 30-59 ml/min (HCC)   Interstitial pulmonary disease (HCC)   Mixed restrictive and obstructive lung disease (HCC)   Acute on chronic diastolic HF (heart failure) (HCC)   Body mass index 45.0-49.9, adult (HCC)   OSA (obstructive sleep apnea)   CHF exacerbation (HCC)    Luetta Nutting, RN , NP student @  07/31/2020, 10:48 AM

## 2020-07-31 NOTE — Plan of Care (Signed)
Pt. Currently resting in bed. She has urinated x5 so far this morning due to IV lasix. She is currently NPO. PT to go for EGD tomorrow. No guiac stools this shift. VS remain WNL. Fuller Canada, RN

## 2020-07-31 NOTE — Progress Notes (Signed)
PROGRESS NOTE  Theresa Fernandez KXF:[id number] DOB: [birth year] DOA: 07/29/2020 PCP: [facility]  Brief History   Theresa Fernandez is a 65 y.o. female with medical history significant of CHF, DM2, COPD on 4.5L o2 at home.   Presented with worsening shortness of breath,despite being on her home O2. She is on 2L O2 at home. In the ED she was requiring 4L to maintain SaO2 of 90%. She had been out of her medications including lasix for the past 3 days. She is supposed to be on 40 mg of Lasix. She has had severe dyspnea on exertion and shortness of breath. She has been having some hot and cold flashes.  And a bit of diarrhea. Denies any sick contacts. She no longer smokes and has been using Lasix on regular basis until she ran out recently.  Late in the day on 07/30/2020 the patient was noted to have had a large black bowel movement. Anticoagulation was stopped. The patient was placed on IV protonix, and GI was consulted in the AM. The patient will go for EGD in the am.  Consultants  . Gastroenterology  Procedures  . None  Antibiotics   Anti-infectives (From admission, onward)   None      Subjective  The patient is resting comfortably. No new complaints.  Objective   Vitals:  Vitals:   07/31/20 0439 07/31/20 0727  BP:  (!) 125/58  Pulse: 98 88  Resp: 20 17  Temp: 98.3 F (36.8 C) 98.2 F (36.8 C)  SpO2:  95%   Exam:  Constitutional:  . The patient is awake, alert, and oriented x 3. No acute distress. Respiratory:  . No increased work of breathing. . No wheezes or rhonchi] . Mild rales at bases bilaterally. . No tactile fremitus Cardiovascular:  . Regular rate and rhythm . No murmurs, ectopy, or gallups. . No lateral PMI. No thrills. Abdomen:  . Abdomen is soft, non-tender, non-distended . No hernias, masses, or organomegaly . Normoactive bowel sounds.  Musculoskeletal:  . No cyanosis, clubbing, or edema Skin:  . No rashes, lesions, ulcers . palpation of  skin: no induration or nodules Neurologic:  . CN 2-12 intact . Sensation all 4 extremities intact Psychiatric:  . Mental status o Mood, affect appropriate o Orientation to person, place, time  . judgment and insight appear intact  I have personally reviewed the following:   Today's Data  . Vitals, CBC, BMP, H&H  Imaging  . CXR  Cardiology Data  . EKG  Scheduled Meds: . ALPRAZolam  0.5 mg Oral QHS  . arformoterol  15 mcg Nebulization BID  . busPIRone  30 mg Oral QHS  . carisoprodol  350 mg Oral BID  . furosemide  40 mg Intravenous Q12H  . insulin aspart  0-5 Units Subcutaneous QHS  . insulin aspart  0-9 Units Subcutaneous TID WC  . lamoTRIgine  25 mg Oral QHS  . pantoprazole (PROTONIX) IV  40 mg Intravenous Q12H  . pravastatin  80 mg Oral q1800  . prazosin  5 mg Oral QHS  . sertraline  200 mg Oral QHS  . sodium chloride flush  3 mL Intravenous Q12H   Continuous Infusions: . sodium chloride      Active Problems:   HTN (hypertension)   DM2 (diabetes mellitus, type 2) (HCC)   Dyslipidemia   CKD (chronic kidney disease) stage 3, GFR 30-59 ml/min (HCC)   Interstitial pulmonary disease (HCC)   Mixed restrictive and obstructive lung disease ([place])  Acute on chronic diastolic HF (heart failure) (HCC)   Body mass index 45.0-49.9, adult (HCC)   OSA (obstructive sleep apnea)   CHF exacerbation (HCC)   LOS: 2 days   A & P  Acute on chronic respiratory failure: The patient is on 3L O2 by nasal cannula at home. She is currently requiring 2L to maintain oxygen saturations of 96%.  Acute on chronic diastolic HF (heart failure) ([place])): Due to medication non-compliance. The patient has been admitted to a telemetry bed. She is receiving lasix 12 mg IV bid. EKG demonstrate no ischemic changes. Troponins were within normal limits and flat. Echocardiogram is pending. ACE/ARBi were contraindicated due to hyperkalemia on admission.   Pulmonary edema: Resolved. Present on CXR due to  acute on chronic diastolic heart failure.    Interstitial pulmonary disease ([place]): This is chronic, but certainly is worsened by pulmonary edema. Continue home medications  Melena: anticoagulation has been held. The patient has been started on IV protonix, and hemoglobin and hematocrit followed. GI has been consulted and the patient will undergo EGD in the am.  HTN (hypertension): Fair control on prazosin alone. Lisinopril is held due to hyperkalemia.   Dyslipidemia: Continue Pravachol as at home.  Anemia: Chronic. Check anemia panel. Suspect dilutional we will repeat after being diuresed if persists may need further work-up. Pt had black stool today. Guaiac pending.  CKD (chronic kidney disease) stage 3a, GFR 30-59 ml/min ([place]): Chronic. Avoid nephrotoxic medications such as NSAIDs, Vanco Zosyn combo.  Avoid hypotension. Monitor creatinine, electrolytes, and volume status.   Dm II: At home the patient is on Actos. HbA1c is 5.8. Here glucoses will be followed with FSBS and SSI.   Hyperkalemia:  Resolved. Was mild. Lisinopril is held.   I have seen and examined this patient myself. I have spent 32 minutes in her evaluation and care.  DVT prophylaxis:    Lovenox    Code Status:    Code Status: Prior FULL CODE  as per patient   I had personally discussed CODE STATUS with patient    Family Communication:   None available  Disposition Plan:     Status is: Inpatient  Remains inpatient appropriate because:Inpatient level of care appropriate due to severity of illness   Dispo: The patient is from: Home              Anticipated d/c is to: Home              Anticipated d/c date is: 2 days              Patient currently is not medically stable to d/c.  Nicholos Aloisi, DO [facility] Direct contact: see [email or web address]  7PM-7AM contact night coverage as above 07/31/2020, 2:52 PM  LOS: 1 day

## 2020-07-31 NOTE — Progress Notes (Signed)
OT Cancellation Note  Patient Details Name: Theresa Fernandez MRN: [id number] DOB: November 15, 1955   Cancelled Treatment:    Reason Eval/Treat Not Completed: Patient at procedure or test/ unavailable. OT will continue to follow for evaluation as schedule allows.  Merri Ray [name] 07/31/2020, 12:41 PM  Jesse Sans OTR/L Acute Rehabilitation Services Pager: [phone] Office: [phone]

## 2020-07-31 NOTE — H&P (View-Only) (Signed)
Referring Provider:  [facility]         Primary Care Physician:  [facility]:  Althia Forts           We were asked to see this patient for:   melena               ASSESSMENT / PLAN:   # 65 yo female with complaints of black runny stools. Stools heme positive in setting of NSAIDs ( Naprosyn).  On iron at home but until Thursday had never paid attention as to whether it turned stools black.  Hgb slightly lower than baseline but stable --Continue PPI currently getting continuous infusion which is fine she she had a black stool today --trend hgb --Will give clears today,  NPO for EGD tomorrow am. The risks and benefits of EGD were discussed and the patient agrees to proceed.    # [place] anemia. Baseline hgb 11-12, currently stable at 10.8.   # COPD, on home 02.   # Acute on chronic CHF. She had missed a week of home lasix. Getting IV lasix      HPI:                                                                                                                             Chief Complaint: black stools  Theresa Fernandez is a 65 y.o. female COPD, OSA, CKD 3, fibromyalgia, chronic pain, diabetes, CHF, HTN , hyperlipidemia, obesity  Patient presented to ED 2 days og with SOB. She was tachypneic but hemodynamically stable and afebrile. She apparently ran out of lasix at home. BNP 158. CXR  Remarkable for cardiomegaly and mild pulmonary venous congestion. Her hgb was at baseline in mid 10 range. WBC normal. Cr 1.02, trop 8. BNP 158. Today her liver tests are normal. Creatinine slightly worse at 1.21.   [name] began having runny black stools 3 days ago. She takes iron but never looked to see if it turned her stools black. She has had some associated nausea with dry heaves but feels like that is due in part to dry, sticky mouth. No abdominal pain. She takes naprosyn daily, no other NSAIDS. She takes a daily PPI which controls GERD  symptoms.   PREVIOUS ENDOSCOPIC EVALUATIONS / PERTINENT STUDIES   None found.  She gives a history of a normal colonoscopy many years ago but cannot remember where it was done   Past Medical History:  Diagnosis Date  . Allergy   . Anxiety   . Arthritis   . CHF (congestive heart failure) ([place])   . COPD (chronic obstructive pulmonary disease) ([place])   . Depression   . Diabetes mellitus without complication ([place])   . HTN (hypertension) 01/12/2013   pt denies    Past Surgical History:  Procedure Laterality Date  . MASTOIDECTOMY    . TONSILLECTOMY    . TUBAL LIGATION      Prior to Admission  medications   Medication Sig Start Date End Date Taking? Authorizing Provider  albuterol (ACCUNEB) 0.63 MG/3ML nebulizer solution Take 3 mLs (0.63 mg total) by nebulization every 4 (four) hours as needed for wheezing or shortness of breath. 07/17/20  Yes Icard, Bradley L, DO  albuterol (VENTOLIN HFA) 108 (90 Base) MCG/ACT inhaler Inhale 2 puffs into the lungs every 6 (six) hours as needed for wheezing or shortness of breath (wheezing). 07/17/20  Yes Icard, Octavio Graves, DO  ALPRAZolam (XANAX) 0.5 MG tablet Take 0.5 mg by mouth at bedtime.  11/28/19  Yes [provider]  busPIRone (BUSPAR) 15 MG tablet Take 30 mg by mouth at bedtime.    Yes [provider]  carisoprodol (SOMA) 250 MG tablet Take 250 mg by mouth See admin instructions. Take one tablet (250 mg) by mouth twice daily - 7pm and midnight 11/27/19  Yes [provider]  HYDROcodone-acetaminophen (NORCO) 10-325 MG tablet Take 1 tablet by mouth every 6 (six) hours as needed (pain).    Yes [provider]  hydrOXYzine (ATARAX/VISTARIL) 25 MG tablet Take 25 mg by mouth at bedtime.  04/21/18  Yes [provider]  lamoTRIgine (LAMICTAL) 25 MG tablet Take 25 mg by mouth at bedtime.  04/21/18  Yes [provider]  Lancets (FREESTYLE) lancets 1 each daily. 02/20/20  Yes [provider]   lisinopril (PRINIVIL,ZESTRIL) 2.5 MG tablet Take 2.5 mg by mouth daily. For kidney function   Yes [provider]  lovastatin (MEVACOR) 40 MG tablet Take 80 mg by mouth daily. 03/18/20  Yes [provider]  Magnesium 500 MG TABS Take 500-1,000 mg by mouth See admin instructions. Take 1 tablet (500 mg) by mouth every morning and 2 tablets (1000 mg) at night   Yes [provider]  omeprazole (PRILOSEC) 40 MG capsule Take 40 mg by mouth daily.   Yes [provider]  ondansetron (ZOFRAN) 8 MG tablet Take 1 tablet (8 mg total) by mouth every 8 (eight) hours as needed for nausea or vomiting. 01/01/20  Yes Daleen Bo, MD  pioglitazone (ACTOS) 15 MG tablet Take 15 mg by mouth daily. 09/13/19  Yes [provider]  prazosin (MINIPRESS) 5 MG capsule Take 5 mg by mouth at bedtime. 12/04/19  Yes [provider]  predniSONE (DELTASONE) 20 MG tablet Take 20 mg by mouth 2 (two) times daily. 03/18/20  Yes [provider]  pregabalin (LYRICA) 150 MG capsule Take 1 capsule (150 mg total) by mouth at bedtime. Patient taking differently: Take 100 mg by mouth 2 (two) times daily. 02/11/13  Yes Johnson, Clanford L, MD  sertraline (ZOLOFT) 100 MG tablet Take 200 mg by mouth at bedtime.    Yes [provider]  Tiotropium Bromide-Olodaterol (STIOLTO RESPIMAT) 2.5-2.5 MCG/ACT AERS Inhale 2 puffs into the lungs daily. 07/17/20  Yes Icard, Bradley L, DO  Tiotropium Bromide-Olodaterol 2.5-2.5 MCG/ACT AERS Inhale 2 puffs into the lungs daily. 07/17/20  Yes Icard, Octavio Graves, DO  Vitamin D, Ergocalciferol, (DRISDOL) 1.25 MG (50000 UNIT) CAPS capsule Take 50,000 Units by mouth every Tuesday.   Yes [provider]  amphetamine-dextroamphetamine (ADDERALL) 20 MG tablet Take 1 tablet (20 mg total) by mouth 3 (three) times daily. Patient not taking: Reported on 07/29/2020 01/12/13   Theodis Blaze, MD  furosemide (LASIX) 40 MG tablet Take 1 tablet (40 mg total)  by mouth 2 (two) times daily. Patient not taking: Reported on 07/29/2020 10/19/18   [name], MD  metolazone (ZAROXOLYN)  2.5 MG tablet Take 1 tablet (2.5 mg total) by mouth every other day. As needed for weight gain of 2 lbs 10/23/18 04/25/20  [name], MD  budesonide-formoterol (SYMBICORT) 80-4.5 MCG/ACT inhaler Inhale 2 puffs into the lungs 2 (two) times daily. Patient not taking: No sig reported 07/20/18 11/23/19  Martyn Ehrich, NP    Current Facility-Administered Medications  Medication Dose Route Frequency Provider Last Rate Last Admin  . 0.9 %  sodium chloride infusion  250 mL Intravenous PRN Toy Baker, MD      . acetaminophen (TYLENOL) tablet 650 mg  650 mg Oral Q6H PRN Doutova, Anastassia, MD       Or  . acetaminophen (TYLENOL) suppository 650 mg  650 mg Rectal Q6H PRN Doutova, Anastassia, MD      . albuterol (PROVENTIL) (2.5 MG/3ML) 0.083% nebulizer solution 2.5 mg  2.5 mg Nebulization Q4H PRN Doutova, Anastassia, MD      . ALPRAZolam Duanne Moron) tablet 0.5 mg  0.5 mg Oral QHS Doutova, Anastassia, MD   0.5 mg at 07/30/20 2150  . arformoterol (BROVANA) nebulizer solution 15 mcg  15 mcg Nebulization BID Doutova, Anastassia, MD   15 mcg at 07/31/20 1016  . busPIRone (BUSPAR) tablet 30 mg  30 mg Oral QHS Doutova, Anastassia, MD   30 mg at 07/30/20 2150  . carisoprodol (SOMA) tablet 350 mg  350 mg Oral BID Doutova, Anastassia, MD   350 mg at 07/31/20 1016  . furosemide (LASIX) injection 40 mg  40 mg Intravenous Q12H Toy Baker, MD   40 mg at 07/31/20 [id number]  . HYDROcodone-acetaminophen (NORCO/VICODIN) 5-325 MG per tablet 1-2 tablet  1-2 tablet Oral Q4H PRN Doutova, Anastassia, MD      . insulin aspart (novoLOG) injection 0-5 Units  0-5 Units Subcutaneous QHS Doutova, Anastassia, MD      . insulin aspart (novoLOG) injection 0-9 Units  0-9 Units Subcutaneous TID WC Doutova, Anastassia, MD      . lamoTRIgine (LAMICTAL) tablet 25 mg  25 mg Oral QHS Doutova, Anastassia,  MD   25 mg at 07/30/20 2150  . ondansetron (ZOFRAN) injection 4 mg  4 mg Intravenous Q6H PRN Swayze, Ava, DO      . pantoprazole (PROTONIX) 80 mg in sodium chloride 0.9 % 100 mL (0.8 mg/mL) infusion  8 mg/hr Intravenous Continuous Swayze, Ava, DO 10 mL/hr at 07/31/20 0538 8 mg/hr at 07/31/20 0538  . [START ON 08/03/2020] pantoprazole (PROTONIX) injection 40 mg  40 mg Intravenous Q12H Swayze, Ava, DO      . pravastatin (PRAVACHOL) tablet 80 mg  80 mg Oral q1800 Doutova, Anastassia, MD   80 mg at 07/30/20 1843  . prazosin (MINIPRESS) capsule 5 mg  5 mg Oral QHS Doutova, Anastassia, MD   5 mg at 07/30/20 2151  . sertraline (ZOLOFT) tablet 200 mg  200 mg Oral QHS Doutova, Anastassia, MD   200 mg at 07/30/20 2150  . sodium chloride flush (NS) 0.9 % injection 3 mL  3 mL Intravenous Q12H Toy Baker, MD   3 mL at 07/30/20 2154  . sodium chloride flush (NS) 0.9 % injection 3 mL  3 mL Intravenous PRN Toy Baker, MD        Allergies as of 07/29/2020 - Review Complete 07/29/2020  Allergen Reaction Noted  . Ceftriaxone Hives and Rash 06/07/2018  . Topamax [topiramate] Other (See Comments) 01/12/2013  . Voltaren [diclofenac sodium] Other (See Comments) 06/06/2018    Family History  Problem Relation Age of Onset  .  Diabetes Mother   . Alcohol abuse Father   . Diabetes Father   . Stroke Brother     Social History   Socioeconomic History  . Marital status: Married    Spouse name: Not on file  . Number of children: Not on file  . Years of education: Not on file  . Highest education level: Not on file  Occupational History  . Not on file  Tobacco Use  . Smoking status: Former Smoker    Packs/day: 3.00    Years: 50.00    Pack years: 150.00    Types: Cigarettes    Quit date: 05/30/2018    Years since quitting: 2.1  . Smokeless tobacco: Never Used  Vaping Use  . Vaping Use: Never used  Substance and Sexual Activity  . Alcohol use: No  . Drug use: No  . Sexual activity: Not  on file  Other Topics Concern  . Not on file  Social History Narrative  . Not on file   Social Determinants of Health   Financial Resource Strain: Not on file  Food Insecurity: Not on file  Transportation Needs: Not on file  Physical Activity: Not on file  Stress: Not on file  Social Connections: Not on file  Intimate Partner Violence: Not on file    Review of Systems: Positive for hot and cold flashes, cough, SOB, leg swelling, headaches. All other systems reviewed and negative except where noted in HPI.    OBJECTIVE:    Physical Exam: Vital signs in last 24 hours: Temp:  [98 F (36.7 C)-98.7 F (37.1 C)] 98.2 F (36.8 C) (01/10 0727) Pulse Rate:  [60-98] 88 (01/10 0727) Resp:  [13-20] 17 (01/10 0727) BP: (99-130)/(58-88) 125/58 (01/10 0727) SpO2:  [94 %-100 %] 95 % (01/10 0727) Weight:  [114.5 kg] 114.5 kg (01/10 0452) Last BM Date: 07/30/20 General:   Alert  obese female in NAD Psych:  Pleasant, cooperative. Normal mood and affect. Eyes:  Pupils equal, sclera clear, no icterus.   Conjunctiva pink. Ears:  Normal auditory acuity. Nose:  No deformity, discharge,  or lesions. Neck:  Supple Lungs:  Clear throughout to auscultation.     Heart:  Regular rate and rhythm;  no lower extremity edema Abdomen:  Soft, non-distended, nontender, BS active, no palp mass   Rectal: hemorrhoidal tags. No stool in vault.  Msk:  Symmetrical without gross deformities. . Neurologic:  Alert and  oriented x4;  grossly normal neurologically. Skin:  Intact without significant lesions or rashes.  Filed Weights   07/31/20 0452  Weight: 114.5 kg     Scheduled inpatient medications . ALPRAZolam  0.5 mg Oral QHS  . arformoterol  15 mcg Nebulization BID  . busPIRone  30 mg Oral QHS  . carisoprodol  350 mg Oral BID  . furosemide  40 mg Intravenous Q12H  . insulin aspart  0-5 Units Subcutaneous QHS  . insulin aspart  0-9 Units Subcutaneous TID WC  . lamoTRIgine  25 mg Oral QHS  .  [START ON 08/03/2020] pantoprazole  40 mg Intravenous Q12H  . pravastatin  80 mg Oral q1800  . prazosin  5 mg Oral QHS  . sertraline  200 mg Oral QHS  . sodium chloride flush  3 mL Intravenous Q12H      Intake/Output from previous day: 01/09 0701 - 01/10 0700 In: 120 [P.O.:120] Out: 953 [Urine:950; Stool:3] Intake/Output this shift: No intake/output data recorded.   Lab Results: Recent Labs    07/29/20 1245  07/29/20 1951 07/30/20 0339 07/30/20 1542 07/30/20 2307 07/31/20 0201 07/31/20 0800  WBC 8.0  --  10.5  --   --  10.0  --   HGB 10.0*   < > 10.4*   < > 10.7* 10.5* 10.9*  HCT 32.7*   < > 34.8*   < > 34.8* 32.8* 34.0*  PLT 261  --  265  --   --  272  --    < > = values in this interval not displayed.   BMET Recent Labs    07/29/20 1245 07/29/20 1951 07/30/20 0339 07/31/20 0201  NA 143 144 145 141  K 4.3 5.2* 3.8 4.4  CL 105  --  107 102  CO2 26  --  25 23  GLUCOSE 119*  --  119* 104*  BUN 11  --  12 15  CREATININE 1.02*  --  1.20* 1.21*  CALCIUM 9.0  --  9.2 8.8*   LFT Recent Labs    07/30/20 0339  PROT 6.7  ALBUMIN 3.4*  AST 25  ALT 16  ALKPHOS 91  BILITOT 0.7   PT/INR No results for input(s): LABPROT, INR in the last 72 hours. Hepatitis Panel No results for input(s): HEPBSAG, HCVAB, HEPAIGM, HEPBIGM in the last 72 hours.   . CBC Latest Ref Rng & Units 07/31/2020 07/31/2020 07/30/2020  WBC 4.0 - 10.5 K/uL - 10.0 -  Hemoglobin 12.0 - 15.0 g/dL 10.9(L) 10.5(L) 10.7(L)  Hematocrit 36.0 - 46.0 % 34.0(L) 32.8(L) 34.8(L)  Platelets 150 - 400 K/uL - 272 -    . CMP Latest Ref Rng & Units 07/31/2020 07/30/2020 07/29/2020  Glucose 70 - 99 mg/dL 104(H) 119(H) -  BUN 8 - 23 mg/dL 15 12 -  Creatinine 0.44 - 1.00 mg/dL 1.21(H) 1.20(H) -  Sodium 135 - 145 mmol/L 141 145 144  Potassium 3.5 - 5.1 mmol/L 4.4 3.8 5.2(H)  Chloride 98 - 111 mmol/L 102 107 -  CO2 22 - 32 mmol/L 23 25 -  Calcium 8.9 - 10.3 mg/dL 8.8(L) 9.2 -  Total Protein 6.5 - 8.1 g/dL - 6.7 -   Total Bilirubin 0.3 - 1.2 mg/dL - 0.7 -  Alkaline Phos 38 - 126 U/L - 91 -  AST 15 - 41 U/L - 25 -  ALT 0 - 44 U/L - 16 -   Studies/Results: DG Chest 2 View  Result Date: 07/29/2020 CLINICAL DATA:  CHF exacerbation.  Pain. EXAM: CHEST - 2 VIEW COMPARISON:  None FINDINGS: Cardiomegaly and pulmonary venous congestion/mild edema. No pneumothorax. No nodules, masses, or focal pulmonary infiltrates. The hila and mediastinum are normal. Previous kyphoplasties at multiple levels in the thoracic spine. IMPRESSION: Cardiomegaly and mild pulmonary venous congestion/mild edema. Electronically Signed   By: Dorise Bullion III M.D   On: 07/29/2020 12:56   ECHOCARDIOGRAM COMPLETE  Result Date: 07/31/2020    ECHOCARDIOGRAM REPORT   Patient Name:   SELENA ROBAR Date of Exam: 07/31/2020 Medical Rec #:  [id number]       Height:       65.0 in Accession #:    [id number]      Weight:       252.4 lb Date of Birth:  October 16, 1955       BSA:          2.184 m Patient Age:    21 years        BP:           99/61 mmHg Patient Gender: F  HR:           98 bpm. Exam Location:  Inpatient Procedure: 2D Echo, Cardiac Doppler and Color Doppler Indications:     CHF-Acute Diastolic  History:         Patient has prior history of Echocardiogram examinations, most                  recent 06/08/2018. CHF; Risk Factors:Hypertension,                  Dyslipidemia, Diabetes and Sleep Apnea. CKD.  Sonographer:     Clayton Lefort RDCS (AE) Referring Phys:  Geryl Rankins DOUTOVA Diagnosing Phys: Fransico Him MD  Sonographer Comments: No subcostal window. Bright ambient room light even with shades pulled. IMPRESSIONS  1. Left ventricular ejection fraction, by estimation, is 65 to 70%. The left ventricle has normal function. The left ventricle has no regional wall motion abnormalities. There is moderate concentric left ventricular hypertrophy. Left ventricular diastolic parameters are consistent with Grade I diastolic dysfunction (impaired  relaxation).  2. Right ventricular systolic function is normal. The right ventricular size is normal. Tricuspid regurgitation signal is inadequate for assessing PA pressure.  3. The mitral valve is normal in structure. No evidence of mitral valve regurgitation. No evidence of mitral stenosis.  4. The aortic valve is tricuspid. Aortic valve regurgitation is mild. Mild aortic valve sclerosis is present, with no evidence of aortic valve stenosis. Aortic regurgitation PHT measures 637 msec. Aortic valve area, by VTI measures 1.89 cm. Aortic valve mean gradient measures 7.0 mmHg. Aortic valve Vmax measures 1.98 m/s. FINDINGS  Left Ventricle: Left ventricular ejection fraction, by estimation, is 65 to 70%. The left ventricle has normal function. The left ventricle has no regional wall motion abnormalities. The left ventricular internal cavity size was normal in size. There is  moderate concentric left ventricular hypertrophy. Left ventricular diastolic parameters are consistent with Grade I diastolic dysfunction (impaired relaxation). Normal left ventricular filling pressure. Right Ventricle: The right ventricular size is normal. No increase in right ventricular wall thickness. Right ventricular systolic function is normal. Tricuspid regurgitation signal is inadequate for assessing PA pressure. Left Atrium: Left atrial size was normal in size. Right Atrium: Right atrial size was normal in size. Pericardium: There is no evidence of pericardial effusion. Mitral Valve: The mitral valve is normal in structure. Mild to moderate mitral annular calcification. No evidence of mitral valve regurgitation. No evidence of mitral valve stenosis. Tricuspid Valve: The tricuspid valve is normal in structure. Tricuspid valve regurgitation is not demonstrated. No evidence of tricuspid stenosis. Aortic Valve: The aortic valve is tricuspid. Aortic valve regurgitation is mild. Aortic regurgitation PHT measures 637 msec. Mild aortic valve  sclerosis is present, with no evidence of aortic valve stenosis. Aortic valve mean gradient measures 7.0 mmHg. Aortic valve peak gradient measures 15.7 mmHg. Aortic valve area, by VTI measures 1.89 cm. Pulmonic Valve: The pulmonic valve was normal in structure. Pulmonic valve regurgitation is not visualized. No evidence of pulmonic stenosis. Aorta: The aortic root is normal in size and structure. Venous: The inferior vena cava was not well visualized. IAS/Shunts: No atrial level shunt detected by color flow Doppler.  LEFT VENTRICLE PLAX 2D LVIDd:         4.10 cm  Diastology LVIDs:         2.80 cm  LV e' medial:    5.11 cm/s LV PW:         1.40 cm  LV E/e' medial:  15.0 LV IVS:        1.40 cm  LV e' lateral:   7.83 cm/s LVOT diam:     1.80 cm  LV E/e' lateral: 9.8 LV SV:         64 LV SV Index:   29 LVOT Area:     2.54 cm  RIGHT VENTRICLE RV Basal diam:  3.30 cm RV S prime:     16.10 cm/s TAPSE (M-mode): 1.9 cm LEFT ATRIUM             Index       RIGHT ATRIUM           Index LA diam:        4.10 cm 1.88 cm/m  RA Area:     17.90 cm LA Vol (A2C):   50.0 ml 22.90 ml/m RA Volume:   48.80 ml  22.35 ml/m LA Vol (A4C):   38.9 ml 17.81 ml/m LA Biplane Vol: 46.9 ml 21.48 ml/m  AORTIC VALVE AV Area (Vmax):    1.63 cm AV Area (Vmean):   1.71 cm AV Area (VTI):     1.89 cm AV Vmax:           198.00 cm/s AV Vmean:          121.000 cm/s AV VTI:            0.340 m AV Peak Grad:      15.7 mmHg AV Mean Grad:      7.0 mmHg LVOT Vmax:         127.00 cm/s LVOT Vmean:        81.500 cm/s LVOT VTI:          0.253 m LVOT/AV VTI ratio: 0.74 AI PHT:            637 msec  AORTA Ao Root diam: 3.60 cm Ao Asc diam:  3.60 cm MITRAL VALVE MV Area (PHT): 3.60 cm     SHUNTS MV Decel Time: 211 msec     Systemic VTI:  0.25 m MV E velocity: 76.50 cm/s   Systemic Diam: 1.80 cm MV A velocity: 104.00 cm/s MV E/A ratio:  0.74 Fransico Him MD Electronically signed by Fransico Him MD Signature Date/Time: 07/31/2020/9:36:31 AM    Final (Updated)      Tye Savoy, NP-C @  07/31/2020, 10:51 AM  ________________________________________________________________________  [facility] GI MD note:  I personally examined the patient, reviewed the data and agree with the assessment and plan described above.  She has had dark stools for 2-3 days.  TAkes two naproxen once daily for years.  Had a large volume nose bleed 2-3 days ago and just started having signficant nose bleed again this afternoon. She is going through tissue after tissue. RN aware and is getting in touch with the hospitalist.   Her dark stools and acute on chronic anemic may certainly be from her recurrent epistaxis.  I recommended we still plan on EGD tomorrow to be safe that there are no GI sources of her melena, anemia.  Given that the probability is a bit less now that we know about her epistaxis, I think it is safe to d/c the PPI drip and change to IV BID BID for now.  Owens Loffler, MD [facility] Pager [phone]

## 2020-08-01 ENCOUNTER — Encounter (HOSPITAL_COMMUNITY): Payer: Self-pay | Admitting: Internal Medicine

## 2020-08-01 ENCOUNTER — Inpatient Hospital Stay (HOSPITAL_COMMUNITY): Payer: Medicare Other | Admitting: Certified Registered Nurse Anesthetist

## 2020-08-01 ENCOUNTER — Encounter (HOSPITAL_COMMUNITY)
Admission: EM | Disposition: A | Discharge status: Home / Self Care | Payer: Self-pay | Source: Home / Self Care | Attending: Internal Medicine

## 2020-08-01 ENCOUNTER — Telehealth: Payer: Self-pay

## 2020-08-01 DIAGNOSIS — K259 Gastric ulcer, unspecified as acute or chronic, without hemorrhage or perforation: Secondary | ICD-10-CM

## 2020-08-01 DIAGNOSIS — Z6841 Body Mass Index (BMI) 40.0 and over, adult: Secondary | ICD-10-CM | POA: Diagnosis not present

## 2020-08-01 DIAGNOSIS — E785 Hyperlipidemia, unspecified: Secondary | ICD-10-CM

## 2020-08-01 DIAGNOSIS — Z20822 Contact with and (suspected) exposure to covid-19: Secondary | ICD-10-CM | POA: Diagnosis not present

## 2020-08-01 DIAGNOSIS — E1122 Type 2 diabetes mellitus with diabetic chronic kidney disease: Secondary | ICD-10-CM | POA: Diagnosis not present

## 2020-08-01 DIAGNOSIS — N1831 Chronic kidney disease, stage 3a: Secondary | ICD-10-CM | POA: Diagnosis not present

## 2020-08-01 DIAGNOSIS — K254 Chronic or unspecified gastric ulcer with hemorrhage: Principal | ICD-10-CM

## 2020-08-01 DIAGNOSIS — R0602 Shortness of breath: Secondary | ICD-10-CM | POA: Diagnosis not present

## 2020-08-01 DIAGNOSIS — K921 Melena: Secondary | ICD-10-CM

## 2020-08-01 DIAGNOSIS — I5033 Acute on chronic diastolic (congestive) heart failure: Secondary | ICD-10-CM | POA: Diagnosis not present

## 2020-08-01 DIAGNOSIS — J9621 Acute and chronic respiratory failure with hypoxia: Secondary | ICD-10-CM | POA: Diagnosis not present

## 2020-08-01 HISTORY — PX: BIOPSY: SHX5522

## 2020-08-01 HISTORY — PX: ESOPHAGOGASTRODUODENOSCOPY (EGD) WITH PROPOFOL: SHX5813

## 2020-08-01 LAB — HEMOGLOBIN AND HEMATOCRIT, BLOOD
HCT: 38 % (ref 36.0–46.0)
Hemoglobin: 11.7 g/dL — ABNORMAL LOW (ref 12.0–15.0)

## 2020-08-01 LAB — GLUCOSE, CAPILLARY: Glucose-Capillary: 94 mg/dL (ref 70–99)

## 2020-08-01 SURGERY — ESOPHAGOGASTRODUODENOSCOPY (EGD) WITH PROPOFOL
Anesthesia: Monitor Anesthesia Care

## 2020-08-01 MED ORDER — PANTOPRAZOLE SODIUM 40 MG PO TBEC: 40.0000 mg | DELAYED_RELEASE_TABLET | Freq: Two times a day (BID) | ORAL | 1 refills | Status: AC

## 2020-08-01 MED ORDER — LACTATED RINGERS IV SOLN: INTRAVENOUS | Status: DC | PRN

## 2020-08-01 MED ORDER — LIDOCAINE 2% (20 MG/ML) 5 ML SYRINGE
INTRAMUSCULAR | Status: DC | PRN
  Administered 2020-08-01: 100 mg via INTRAVENOUS

## 2020-08-01 MED ORDER — PROPOFOL 10 MG/ML IV BOLUS
INTRAVENOUS | Status: DC | PRN
  Administered 2020-08-01: 40 mg via INTRAVENOUS

## 2020-08-01 MED ORDER — PROPOFOL 500 MG/50ML IV EMUL
INTRAVENOUS | Status: DC | PRN
  Administered 2020-08-01: 100 ug/kg/min via INTRAVENOUS

## 2020-08-01 MED ORDER — SODIUM CHLORIDE 0.9 % IV SOLN: Freq: Once | INTRAVENOUS | Status: AC

## 2020-08-01 MED ORDER — PHENYLEPHRINE 40 MCG/ML (10ML) SYRINGE FOR IV PUSH (FOR BLOOD PRESSURE SUPPORT)
PREFILLED_SYRINGE | INTRAVENOUS | Status: DC | PRN
  Administered 2020-08-01: 120 ug via INTRAVENOUS

## 2020-08-01 SURGICAL SUPPLY — 15 items

## 2020-08-01 NOTE — Progress Notes (Signed)
OT Cancellation Note  Patient Details Name: Theresa Fernandez MRN: [id number] DOB: 1956/03/14   Cancelled Treatment:    Reason Eval/Treat Not Completed: OT screened, no needs identified, will sign off (Pt reporting to be moving well in her room and no ADL needs. Energy conservation strategies discussed with pt and her spouse. Pt describing her home set-up and pt has L[place]ana and BSC for ways to stay independent. Pt has spousal assist for tasks.) Pt awaiting d/c information in room. OT signing off. Thank you for this referral.    Jefferey Pica, OTR/L Acute Rehabilitation Services Pager: [phone] Office: [phone]  Pamala Hayman C 08/01/2020, 11:50 AM

## 2020-08-01 NOTE — Transfer of Care (Signed)
Immediate Anesthesia Transfer of Care Note  Patient: Yvanna Vidas  Procedure(s) Performed: ESOPHAGOGASTRODUODENOSCOPY (EGD) WITH PROPOFOL (N/A ) BIOPSY  Patient Location: Endoscopy Unit  Anesthesia Type:MAC  Level of Consciousness: awake and alert   Airway & Oxygen Therapy: Patient Spontanous Breathing and Patient connected to nasal cannula oxygen  Post-op Assessment: Report given to RN and Post -op Vital signs reviewed and stable  Post vital signs: Reviewed and stable  Last Vitals:  Vitals Value Taken Time  BP 124/69   Temp    Pulse 94 08/01/20 0940  Resp 27 08/01/20 0940  SpO2 94 % 08/01/20 0940  Vitals shown include unvalidated device data.  Last Pain:  Vitals:   08/01/20 0846  TempSrc: Temporal  PainSc: 0-No pain         Complications: No complications documented.

## 2020-08-01 NOTE — Telephone Encounter (Signed)
-----   Message from Milus Banister, MD sent at 08/01/2020  9:41 AM EST ----- She needs OV with me in 4-6 weeks, hospital follow up, gastric ulcer.  CBC a few days prior would be great.  Thanks

## 2020-08-01 NOTE — Addendum Note (Signed)
Addendum  created 08/01/20 1042 by Bryson Corona, CRNA   Charge Capture section accepted

## 2020-08-01 NOTE — Op Note (Signed)
[facility] Patient Name: Theresa Fernandez Procedure Date : 08/01/2020 MRN: [id number] Attending MD: Milus Banister , MD Date of Birth: [birth year] CSN: [id number] Age: 65 Admit Type: Inpatient Procedure:                Upper GI endoscopy Indications:              Melena, daily NSAIDs, also two large nosebleeds                            recently (one witnessed in her hosp room yesterday) Providers:                Milus Banister, MD, Kary Kos RN, RN, Benetta Spar, Technician, Elmer Sow Referring MD:              Medicines:                Monitored Anesthesia Care Complications:            No immediate complications. Estimated blood loss:                            None. Estimated Blood Loss:     Estimated blood loss: none. Procedure:                Pre-Anesthesia Assessment:                           - Prior to the procedure, a History and Physical                            was performed, and patient medications and                            allergies were reviewed. The patient's tolerance of                            previous anesthesia was also reviewed. The risks                            and benefits of the procedure and the sedation                            options and risks were discussed with the patient.                            All questions were answered, and informed consent                            was obtained. Prior Anticoagulants: The patient has                            taken no previous anticoagulant or antiplatelet  agents except for NSAID medication. ASA Grade                            Assessment: III - A patient with severe systemic                            disease. After reviewing the risks and benefits,                            the patient was deemed in satisfactory condition to                            undergo the procedure.                           After obtaining  informed consent, the endoscope was                            passed under direct vision. Throughout the                            procedure, the patient's blood pressure, pulse, and                            oxygen saturations were monitored continuously. The                            GIF-H190 ([id number] Olympus gastroscope was                            introduced through the mouth, and advanced to the                            second part of duodenum. The upper GI endoscopy was                            accomplished without difficulty. The patient                            tolerated the procedure well. Scope In: Scope Out: Findings:      No recent or old blood in the UGI tract.      Four clean based ulcers along the lesser curvature of the gastric body.       None appeared neoplastic. The largest was 5-52mm across.      Mild inflammation characterized by erythema, friability and granularity       was found in the entire examined stomach. Biopsies were taken with a       cold forceps for histology.      The exam was otherwise without abnormality. Impression:               - No recent or old blood in the UGI tract.                           - Four clean based ulcers along  the lesser                            curvature of the gastric body. None appeared                            neoplastic. The largest was 5-54mm across.                           - Mild pan-gastritis, biopsied to check for H.                            pylori.                           - The examination was otherwise normal. Recommendation:           - Await pathology results.                           - She is safe for d/c today. Should stay on BID PPI                            (possibly indefinitely) and avoid NSAIDs as best as                            possible.                           - My office will contact her about follow up visit                            in 5-6 weeks, will arrange repeat EGD from  that                            appointment to check for ulcer resolution.                           - Her recent nose bleeds probably play some role in                            her melena as well. Procedure Code(s):        --- Professional ---                           [id number], Esophagogastroduodenoscopy, flexible,                            transoral; with biopsy, single or multiple Diagnosis Code(s):        --- Professional ---                           K29.70, Gastritis, unspecified, without bleeding                           K92.1, Melena (includes Hematochezia) CPT  copyright 2019 [employer]. All rights reserved. The codes documented in this report are preliminary and upon coder review may  be revised to meet current compliance requirements. Milus Banister, MD 08/01/2020 9:36:32 AM This report has been signed electronically. Number of Addenda: 0

## 2020-08-01 NOTE — Telephone Encounter (Signed)
Appt made with Dr Ardis Hughs for 09/22/20 at 850 am.  Lab order entered to be completed prior to the appt. Pt will be advised at discharge from the hospital .

## 2020-08-01 NOTE — Anesthesia Preprocedure Evaluation (Addendum)
Anesthesia Evaluation  Patient identified by MRN, date of birth, ID band Patient awake    Reviewed: Allergy & Precautions, NPO status , Patient's Chart, lab work & pertinent test results  History of Anesthesia Complications Negative for: history of anesthetic complications  Airway Mallampati: I  TM Distance: >3 FB Neck ROM: Full    Dental  (+) Edentulous Upper, Edentulous Lower   Pulmonary sleep apnea and Oxygen sleep apnea , COPD,  COPD inhaler and oxygen dependent, former smoker,  07/29/2020 SARS coronavirus NEG   breath sounds clear to auscultation       Cardiovascular hypertension, Pt. on medications (-) angina Rhythm:Regular Rate:Normal  07/31/2020 ECHO: EF 65-70%, mild AI   Neuro/Psych Anxiety Depression negative neurological ROS     GI/Hepatic Neg liver ROS, GERD  Controlled,  Endo/Other  diabetes (glu 94), Oral Hypoglycemic AgentsMorbid obesity  Renal/GU Renal InsufficiencyRenal disease (creat 1.21)     Musculoskeletal   Abdominal (+) + obese,   Peds  Hematology   Anesthesia Other Findings   Reproductive/Obstetrics                            Anesthesia Physical Anesthesia Plan  ASA: III  Anesthesia Plan: MAC   Post-op Pain Management:    Induction:   PONV Risk Score and Plan: 2 and Treatment may vary due to age or medical condition  Airway Management Planned: Natural Airway and Nasal Cannula  Additional Equipment: None  Intra-op Plan:   Post-operative Plan:   Informed Consent: I have reviewed the patients History and Physical, chart, labs and discussed the procedure including the risks, benefits and alternatives for the proposed anesthesia with the patient or authorized representative who has indicated his/her understanding and acceptance.       Plan Discussed with: CRNA and Surgeon  Anesthesia Plan Comments:        Anesthesia Quick Evaluation

## 2020-08-01 NOTE — Anesthesia Postprocedure Evaluation (Signed)
Anesthesia Post Note  Patient: Arturo Freundlich  Procedure(s) Performed: ESOPHAGOGASTRODUODENOSCOPY (EGD) WITH PROPOFOL (N/A ) BIOPSY     Patient location during evaluation: Endoscopy Anesthesia Type: MAC Level of consciousness: awake and alert, patient cooperative and oriented Pain management: pain level controlled Vital Signs Assessment: post-procedure vital signs reviewed and stable Respiratory status: spontaneous breathing, nonlabored ventilation, respiratory function stable and patient connected to nasal cannula oxygen Cardiovascular status: blood pressure returned to baseline and stable Postop Assessment: no apparent nausea or vomiting Anesthetic complications: no   No complications documented.  Last Vitals:  Vitals:   08/01/20 0950 08/01/20 1000  BP: 133/62 (!) 118/48  Pulse: 76 75  Resp: 19 19  Temp:    SpO2: 100% 100%    Last Pain:  Vitals:   08/01/20 1000  TempSrc:   PainSc: 0-No pain                 Edwyna Dangerfield,E. Graiden Henes

## 2020-08-01 NOTE — Discharge Summary (Signed)
Physician Discharge Summary  [name] [id number] DOB: [birth year] DOA: 07/29/2020  PCP: [facility]  Admit date: 07/29/2020 Discharge date: 08/01/2020  Recommendations for Outpatient Follow-up:  1. Discharge to home. 2. Take lasix and metolazone as prescribed. 3. Weight self daily. Write down weights. Call PCP if you gain 3 lbs or more in one day or gain 5 lbs or more in 1 week. 4. Follow up with PCP in 7-10 days. CBC and chemistry to be drawn at that time and reported to PCP. 5. Follow up with GI as directed. 6. Avoid NSAIDS.  Discharge Diagnoses: Principal diagnosis is #1 1. Acute on chronic respiratory failrue 2. Acute on chronic diastolic heart failure 3. Pulmonary edema 4. Medical non-compliance 5. Interstitial pulmonary disease 6. Melena/Upper GI bleed due to 4 bastric ulcers, and pan-gastritis 7. Anemia 8. CKD IIIa 9. DM II 10. Hyperkalemia  Discharge Condition: Fair  Disposition: Home   Diet recommendation: Soft  Filed Weights   07/31/20 0452 08/01/20 0353 08/01/20 0846  Weight: 114.5 kg 113.9 kg 113.9 kg    History of present illness: Theresa Fernandez is a 65 y.o. female with medical history significant of CHF, DM2, COPD on 4.5L o2 at home.     Presented with worsening shortness of breath,despite being on her home o2. She has been out of her medications for the past 3 days. Have not had her LAsix She is supposed to be on 40 mg of Lasix Reports much harder time with walking getting a lot of shortness of breath She has been having some hot and cold flashes.  And a bit of diarrhea Denies any sick contacts. She no longer smokes and has been using Lasix on regular basis until she ran out recently.  Hospital Course:  [facility] were consulted to admit the patient for further evaluation and care. The patient was diuresed with IV lasix.   Late in the day on 07/30/2020 the patient was noted to have had a large black bowel movement.  Anticoagulation was stopped. The patient was placed on IV protonix, and GI was consulted in the AM. The patient underwent [place] on the am of 08/01/2020. She was found to have 4 non bleeding ulcers and pan gastritis. She was tolerating a soft diet and was on her baseline oxygen requirements. She was discharged to home on bid protonix and her previous dose of lasix and metolazone. She has been advised to avoid all NSAIDs.  Today's assessment: S: The patient is resting quietly. No new complaints. O: Vitals:  Vitals:   08/01/20 0950 08/01/20 1000  BP: 133/62 (!) 118/48  Pulse: 76 75  Resp: 19 19  Temp:    SpO2: 100% 100%   Exam:  Constitutional:   The patient is awake, alert, and oriented x 3. No acute distress. Respiratory:   No increased work of breathing.  No wheezes or rhonchi]  Mild rales at bases bilaterally.  No tactile fremitus Cardiovascular:   Regular rate and rhythm  No murmurs, ectopy, or gallups.  No lateral PMI. No thrills. Abdomen:   Abdomen is soft, non-tender, non-distended  No hernias, masses, or organomegaly  Normoactive bowel sounds.  Musculoskeletal:   No cyanosis, clubbing, or edema Skin:   No rashes, lesions, ulcers  palpation of skin: no induration or nodules Neurologic:   CN 2-12 intact  Sensation all 4 extremities intact Psychiatric:   Mental status ? Mood, affect appropriate ? Orientation to person, place, time   judgment and insight appear intact  Discharge Instructions  Discharge Instructions    Call MD for:   Complete by: As directed    Black bowel movements, bloody bowel movements.   Call MD for:  difficulty breathing, headache or visual disturbances   Complete by: As directed    Call MD for:  persistant nausea and vomiting   Complete by: As directed    Diet - low sodium heart healthy   Complete by: As directed    Diet Carb Modified   Complete by: As directed    Discharge instructions   Complete by: As directed     Discharge to home. Take lasix and metolazone as prescribed. Weight self daily. Write down weights. Call PCP if you gain 3 lbs or more in one day or gain 5 lbs or more in 1 week. Follow up with PCP in 7-10 days. CBC and chemistry to be drawn at that time and reported to PCP. Follow up with GI as directed. Avoid NSAIDS.   Heart Failure patients record your daily weight using the same scale at the same time of day   Complete by: As directed    Increase activity slowly   Complete by: As directed      Allergies as of 08/01/2020      Reactions   Ceftriaxone Hives, Rash   After second dose on 06/07/2018,   Topamax [topiramate] Other (See Comments)   Made headache worse   Capsaicin Rash   Voltaren [diclofenac Sodium] Other (See Comments)   Caused migraine      Medication List    STOP taking these medications   omeprazole 40 MG capsule Commonly known as: PRILOSEC   predniSONE 20 MG tablet Commonly known as: DELTASONE     TAKE these medications   albuterol 0.63 MG/3ML nebulizer solution Commonly known as: ACCUNEB Take 3 mLs (0.63 mg total) by nebulization every 4 (four) hours as needed for wheezing or shortness of breath.   albuterol 108 (90 Base) MCG/ACT inhaler Commonly known as: VENTOLIN HFA Inhale 2 puffs into the lungs every 6 (six) hours as needed for wheezing or shortness of breath (wheezing).   ALPRAZolam 0.5 MG tablet Commonly known as: XANAX Take 0.5 mg by mouth at bedtime.   amphetamine-dextroamphetamine 20 MG tablet Commonly known as: ADDERALL Take 1 tablet (20 mg total) by mouth 3 (three) times daily.   busPIRone 15 MG tablet Commonly known as: BUSPAR Take 30 mg by mouth at bedtime.   carisoprodol 250 MG tablet Commonly known as: SOMA Take 250 mg by mouth See admin instructions. Take one tablet (250 mg) by mouth twice daily - 7pm and midnight   freestyle lancets 1 each daily.   furosemide 40 MG tablet Commonly known as: LASIX Take 1 tablet (40 mg  total) by mouth 2 (two) times daily.   HYDROcodone-acetaminophen 10-325 MG tablet Commonly known as: NORCO Take 1 tablet by mouth every 6 (six) hours as needed (pain).   hydrOXYzine 25 MG tablet Commonly known as: ATARAX/VISTARIL Take 25 mg by mouth at bedtime.   lamoTRIgine 25 MG tablet Commonly known as: LAMICTAL Take 25 mg by mouth at bedtime.   lisinopril 2.5 MG tablet Commonly known as: ZESTRIL Take 2.5 mg by mouth daily. For kidney function   lovastatin 40 MG tablet Commonly known as: MEVACOR Take 80 mg by mouth daily.   Magnesium 500 MG Tabs Take 500-1,000 mg by mouth See admin instructions. Take 1 tablet (500 mg) by mouth every morning and 2 tablets (1000 mg) at  night   metolazone 2.5 MG tablet Commonly known as: ZAROXOLYN Take 1 tablet (2.5 mg total) by mouth every other day. As needed for weight gain of 2 lbs   ondansetron 8 MG tablet Commonly known as: Zofran Take 1 tablet (8 mg total) by mouth every 8 (eight) hours as needed for nausea or vomiting.   pantoprazole 40 MG tablet Commonly known as: Protonix Take 1 tablet (40 mg total) by mouth 2 (two) times daily.   pioglitazone 15 MG tablet Commonly known as: ACTOS Take 15 mg by mouth daily.   prazosin 5 MG capsule Commonly known as: MINIPRESS Take 5 mg by mouth at bedtime.   pregabalin 150 MG capsule Commonly known as: LYRICA Take 1 capsule (150 mg total) by mouth at bedtime. What changed:   how much to take  when to take this   sertraline 100 MG tablet Commonly known as: ZOLOFT Take 200 mg by mouth at bedtime.   Stiolto Respimat 2.5-2.5 MCG/ACT Aers Generic drug: Tiotropium Bromide-Olodaterol Inhale 2 puffs into the lungs daily.   Tiotropium Bromide-Olodaterol 2.5-2.5 MCG/ACT Aers Inhale 2 puffs into the lungs daily.   Vitamin D (Ergocalciferol) 1.25 MG (50000 UNIT) Caps capsule Commonly known as: DRISDOL Take 50,000 Units by mouth every Tuesday.      Allergies  Allergen Reactions  .  Ceftriaxone Hives and Rash    After second dose on 06/07/2018,  . Topamax [Topiramate] Other (See Comments)    Made headache worse  . Capsaicin Rash  . Voltaren [Diclofenac Sodium] Other (See Comments)    Caused migraine    The results of significant diagnostics from this hospitalization (including imaging, microbiology, ancillary and laboratory) are listed below for reference.    Significant Diagnostic Studies: DG Chest 2 View  Result Date: 07/29/2020 CLINICAL DATA:  CHF exacerbation.  Pain. EXAM: CHEST - 2 VIEW COMPARISON:  None FINDINGS: Cardiomegaly and pulmonary venous congestion/mild edema. No pneumothorax. No nodules, masses, or focal pulmonary infiltrates. The hila and mediastinum are normal. Previous kyphoplasties at multiple levels in the thoracic spine. IMPRESSION: Cardiomegaly and mild pulmonary venous congestion/mild edema. Electronically Signed   By: Dorise Bullion III M.D   On: 07/29/2020 12:56   ECHOCARDIOGRAM COMPLETE  Result Date: 07/31/2020    ECHOCARDIOGRAM REPORT   Patient Name:   Theresa Fernandez Date of Exam: 07/31/2020 Medical Rec #:  [id number]       Height:       65.0 in Accession #:    [id number]      Weight:       252.4 lb Date of Birth:  06-Apr-1956       BSA:          2.184 m Patient Age:    63 years        BP:           99/61 mmHg Patient Gender: F               HR:           98 bpm. Exam Location:  Inpatient Procedure: 2D Echo, Cardiac Doppler and Color Doppler Indications:     CHF-Acute Diastolic  History:         Patient has prior history of Echocardiogram examinations, most                  recent 06/08/2018. CHF; Risk Factors:Hypertension,                  Dyslipidemia, Diabetes and  Sleep Apnea. CKD.  Sonographer:     Clayton Lefort RDCS (AE) Referring Phys:  Geryl Rankins DOUTOVA Diagnosing Phys: Fransico Him MD  Sonographer Comments: No subcostal window. Bright ambient room light even with shades pulled. IMPRESSIONS  1. Left ventricular ejection fraction, by  estimation, is 65 to 70%. The left ventricle has normal function. The left ventricle has no regional wall motion abnormalities. There is moderate concentric left ventricular hypertrophy. Left ventricular diastolic parameters are consistent with Grade I diastolic dysfunction (impaired relaxation).  2. Right ventricular systolic function is normal. The right ventricular size is normal. Tricuspid regurgitation signal is inadequate for assessing PA pressure.  3. The mitral valve is normal in structure. No evidence of mitral valve regurgitation. No evidence of mitral stenosis.  4. The aortic valve is tricuspid. Aortic valve regurgitation is mild. Mild aortic valve sclerosis is present, with no evidence of aortic valve stenosis. Aortic regurgitation PHT measures 637 msec. Aortic valve area, by VTI measures 1.89 cm. Aortic valve mean gradient measures 7.0 mmHg. Aortic valve Vmax measures 1.98 m/s. FINDINGS  Left Ventricle: Left ventricular ejection fraction, by estimation, is 65 to 70%. The left ventricle has normal function. The left ventricle has no regional wall motion abnormalities. The left ventricular internal cavity size was normal in size. There is  moderate concentric left ventricular hypertrophy. Left ventricular diastolic parameters are consistent with Grade I diastolic dysfunction (impaired relaxation). Normal left ventricular filling pressure. Right Ventricle: The right ventricular size is normal. No increase in right ventricular wall thickness. Right ventricular systolic function is normal. Tricuspid regurgitation signal is inadequate for assessing PA pressure. Left Atrium: Left atrial size was normal in size. Right Atrium: Right atrial size was normal in size. Pericardium: There is no evidence of pericardial effusion. Mitral Valve: The mitral valve is normal in structure. Mild to moderate mitral annular calcification. No evidence of mitral valve regurgitation. No evidence of mitral valve stenosis. Tricuspid  Valve: The tricuspid valve is normal in structure. Tricuspid valve regurgitation is not demonstrated. No evidence of tricuspid stenosis. Aortic Valve: The aortic valve is tricuspid. Aortic valve regurgitation is mild. Aortic regurgitation PHT measures 637 msec. Mild aortic valve sclerosis is present, with no evidence of aortic valve stenosis. Aortic valve mean gradient measures 7.0 mmHg. Aortic valve peak gradient measures 15.7 mmHg. Aortic valve area, by VTI measures 1.89 cm. Pulmonic Valve: The pulmonic valve was normal in structure. Pulmonic valve regurgitation is not visualized. No evidence of pulmonic stenosis. Aorta: The aortic root is normal in size and structure. Venous: The inferior vena cava was not well visualized. IAS/Shunts: No atrial level shunt detected by color flow Doppler.  LEFT VENTRICLE PLAX 2D LVIDd:         4.10 cm  Diastology LVIDs:         2.80 cm  LV e' medial:    5.11 cm/s LV PW:         1.40 cm  LV E/e' medial:  15.0 LV IVS:        1.40 cm  LV e' lateral:   7.83 cm/s LVOT diam:     1.80 cm  LV E/e' lateral: 9.8 LV SV:         64 LV SV Index:   29 LVOT Area:     2.54 cm  RIGHT VENTRICLE RV Basal diam:  3.30 cm RV S prime:     16.10 cm/s TAPSE (M-mode): 1.9 cm LEFT ATRIUM  Index       RIGHT ATRIUM           Index LA diam:        4.10 cm 1.88 cm/m  RA Area:     17.90 cm LA Vol (A2C):   50.0 ml 22.90 ml/m RA Volume:   48.80 ml  22.35 ml/m LA Vol (A4C):   38.9 ml 17.81 ml/m LA Biplane Vol: 46.9 ml 21.48 ml/m  AORTIC VALVE AV Area (Vmax):    1.63 cm AV Area (Vmean):   1.71 cm AV Area (VTI):     1.89 cm AV Vmax:           198.00 cm/s AV Vmean:          121.000 cm/s AV VTI:            0.340 m AV Peak Grad:      15.7 mmHg AV Mean Grad:      7.0 mmHg LVOT Vmax:         127.00 cm/s LVOT Vmean:        81.500 cm/s LVOT VTI:          0.253 m LVOT/AV VTI ratio: 0.74 AI PHT:            637 msec  AORTA Ao Root diam: 3.60 cm Ao Asc diam:  3.60 cm MITRAL VALVE MV Area (PHT): 3.60 cm      SHUNTS MV Decel Time: 211 msec     Systemic VTI:  0.25 m MV E velocity: 76.50 cm/s   Systemic Diam: 1.80 cm MV A velocity: 104.00 cm/s MV E/A ratio:  0.74 Fransico Him MD Electronically signed by Fransico Him MD Signature Date/Time: 07/31/2020/9:36:31 AM    Final (Updated)     Microbiology: Recent Results (from the past 240 hour(s))  Resp Panel by RT-PCR (Flu A&B, Covid) Nasopharyngeal Swab     Status: None   Collection Time: 07/29/20  6:16 PM   Specimen: Nasopharyngeal Swab; Nasopharyngeal(NP) swabs in vial transport medium  Result Value Ref Range Status   SARS Coronavirus 2 by RT PCR NEGATIVE NEGATIVE Final    Comment: (NOTE) SARS-CoV-2 target nucleic acids are NOT DETECTED.  The SARS-CoV-2 RNA is generally detectable in upper respiratory specimens during the acute phase of infection. The lowest concentration of SARS-CoV-2 viral copies this assay can detect is 138 copies/mL. A negative result does not preclude SARS-Cov-2 infection and should not be used as the sole basis for treatment or other patient management decisions. A negative result may occur with  improper specimen collection/handling, submission of specimen other than nasopharyngeal swab, presence of viral mutation(s) within the areas targeted by this assay, and inadequate number of viral copies(<138 copies/mL). A negative result must be combined with clinical observations, patient history, and epidemiological information. The expected result is Negative.  Fact Sheet for Patients:  EntrepreneurPulse.com.au  Fact Sheet for Healthcare Providers:  IncredibleEmployment.be  This test is no t yet approved or cleared by the [place] FDA and  has been authorized for detection and/or diagnosis of SARS-CoV-2 by FDA under an Emergency Use Authorization (EUA). This EUA will remain  in effect (meaning this test can be used) for the duration of the COVID-19 declaration under Section 564(b)(1)  of the Act, 21 U.S.C.section 360bbb-3(b)(1), unless the authorization is terminated  or revoked sooner.       Influenza A by PCR NEGATIVE NEGATIVE Final   Influenza B by PCR NEGATIVE NEGATIVE Final    Comment: (NOTE) The  Xpert Xpress SARS-CoV-2/FLU/RSV plus assay is intended as an aid in the diagnosis of influenza from Nasopharyngeal swab specimens and should not be used as a sole basis for treatment. Nasal washings and aspirates are unacceptable for Xpert Xpress SARS-CoV-2/FLU/RSV testing.  Fact Sheet for Patients: EntrepreneurPulse.com.au  Fact Sheet for Healthcare Providers: IncredibleEmployment.be  This test is not yet approved or cleared by the [place] FDA and has been authorized for detection and/or diagnosis of SARS-CoV-2 by FDA under an Emergency Use Authorization (EUA). This EUA will remain in effect (meaning this test can be used) for the duration of the COVID-19 declaration under Section 564(b)(1) of the Act, 21 U.S.C. section 360bbb-3(b)(1), unless the authorization is terminated or revoked.  Performed at [facility], [place] [address] [place], [place] [address]      Labs: Basic Metabolic Panel: Recent Labs  Lab 07/29/20 1245 07/29/20 1951 07/30/20 0339 07/31/20 0201  NA 143 144 145 141  K 4.3 5.2* 3.8 4.4  CL 105  --  107 102  CO2 26  --  25 23  GLUCOSE 119*  --  119* 104*  BUN 11  --  12 15  CREATININE 1.02*  --  1.20* 1.21*  CALCIUM 9.0  --  9.2 8.8*  MG  --   --  2.1  --   PHOS  --   --  2.2*  --    Liver Function Tests: Recent Labs  Lab 07/30/20 0339  AST 25  ALT 16  ALKPHOS 91  BILITOT 0.7  PROT 6.7  ALBUMIN 3.4*   No results for input(s): LIPASE, AMYLASE in the last 168 hours. No results for input(s): AMMONIA in the last 168 hours. CBC: Recent Labs  Lab 07/29/20 1245 07/29/20 1951 07/30/20 0339 07/30/20 1542 07/31/20 0201 07/31/20 0800 07/31/20 1649 07/31/20 2313 08/01/20 0702   WBC 8.0  --  10.5  --  10.0  --   --   --   --   NEUTROABS  --   --  7.6  --  7.3  --   --   --   --   HGB 10.0*   < > 10.4*   < > 10.5* 10.9* 10.8* 12.0 11.7*  HCT 32.7*   < > 34.8*   < > 32.8* 34.0* 33.5* 36.9 38.0  MCV 97.9  --  98.0  --  95.1  --   --   --   --   PLT 261  --  265  --  272  --   --   --   --    < > = values in this interval not displayed.   Cardiac Enzymes: No results for input(s): CKTOTAL, CKMB, CKMBINDEX, TROPONINI in the last 168 hours. BNP: BNP (last 3 results) Recent Labs    07/29/20 1804  BNP 158.2*    ProBNP (last 3 results) No results for input(s): PROBNP in the last 8760 hours.  CBG: Recent Labs  Lab 07/31/20 0622 07/31/20 1124 07/31/20 1747 07/31/20 2135 08/01/20 0639  GLUCAP 109* 100* 96 83 94    Active Problems:   HTN (hypertension)   DM2 (diabetes mellitus, type 2) (HCC)   Dyslipidemia   CKD (chronic kidney disease) stage 3, GFR 30-59 ml/min (HCC)   Interstitial pulmonary disease (HCC)   Mixed restrictive and obstructive lung disease (HCC)   Acute on chronic diastolic HF (heart failure) (HCC)   Body mass index 45.0-49.9, adult (HCC)   OSA (obstructive sleep apnea)  CHF exacerbation ([place])   Melena   Gastric ulcer   Time coordinating discharge: 38 minutes  Signed:        Lanique Gonzalo, DO [facility]  08/01/2020, 4:22 PM

## 2020-08-01 NOTE — Interval H&P Note (Signed)
History and Physical Interval Note:  08/01/2020 9:07 AM  Theresa Fernandez  has presented today for surgery, with the diagnosis of Melena.  The various methods of treatment have been discussed with the patient and family. After consideration of risks, benefits and other options for treatment, the patient has consented to  Procedure(s): ESOPHAGOGASTRODUODENOSCOPY (EGD) WITH PROPOFOL (N/A) as a surgical intervention.  The patient's history has been reviewed, patient examined, no change in status, stable for surgery.  I have reviewed the patient's chart and labs.  Questions were answered to the patient's satisfaction.     Milus Banister

## 2020-08-02 ENCOUNTER — Encounter (HOSPITAL_COMMUNITY): Payer: Self-pay | Admitting: Gastroenterology

## 2020-08-02 ENCOUNTER — Other Ambulatory Visit: Payer: Self-pay | Admitting: Physician Assistant

## 2020-08-03 LAB — SURGICAL PATHOLOGY

## 2020-08-22 ENCOUNTER — Other Ambulatory Visit: Payer: Self-pay | Admitting: Orthopedic Surgery

## 2020-08-22 DIAGNOSIS — M546 Pain in thoracic spine: Secondary | ICD-10-CM

## 2020-08-22 DIAGNOSIS — M545 Low back pain, unspecified: Secondary | ICD-10-CM

## 2020-08-31 ENCOUNTER — Telehealth: Payer: Self-pay | Admitting: Pulmonary Disease

## 2020-08-31 NOTE — Telephone Encounter (Signed)
Will route message to provider/nurse to see if they have received inogen form.

## 2020-09-05 NOTE — Telephone Encounter (Signed)
Form is located in BI sign folder.  Will get this faxed back once this has been signed.

## 2020-09-12 NOTE — Telephone Encounter (Signed)
Leigh, please advise if this has been done. Thanks.

## 2020-09-14 NOTE — Telephone Encounter (Signed)
This has been completed.

## 2020-09-20 ENCOUNTER — Telehealth: Payer: Self-pay | Admitting: Pulmonary Disease

## 2020-09-20 NOTE — Telephone Encounter (Signed)
Call made to [employer], spoke with Theresa Fernandez, confirmed patient DOB. Made aware we need the re-certification form sent to our office. Form to be faxed. Will route message to [facility] pool so they will be on the look out for the re-certification form.   Call made to patient to make aware as soon as we receive the form we will get it taken care of. Voiced understanding.

## 2020-09-22 ENCOUNTER — Ambulatory Visit: Payer: Medicare Other | Admitting: Gastroenterology

## 2020-09-22 NOTE — Telephone Encounter (Signed)
LMTCB

## 2020-09-22 NOTE — Telephone Encounter (Signed)
If this is a CMN the last one I received was on 08/30/20 and was faxed over on 09/01/20

## 2020-09-26 NOTE — Telephone Encounter (Signed)
Community message sent to [employer] to see what the form is.

## 2020-10-03 NOTE — Telephone Encounter (Signed)
I have sent a message to Denny Peon I do not have a CMN for this patient

## 2020-10-03 NOTE — Telephone Encounter (Signed)
  Theresa Fernandez,   We just need the CMN signed by the MD for her O2.   Thanks!  -----------------------------------------------  Received message from [place] with [employer]. Will forward to [place] as they may not have received the last CMN or a new one is needed.

## 2020-10-18 NOTE — Telephone Encounter (Signed)
I still have not got any forms for this lady

## 2020-10-19 NOTE — Telephone Encounter (Signed)
I sent a message to [facility], with [employer], to refax the CMN to [place].

## 2020-10-20 ENCOUNTER — Other Ambulatory Visit (INDEPENDENT_AMBULATORY_CARE_PROVIDER_SITE_OTHER): Payer: Self-pay

## 2020-10-20 ENCOUNTER — Other Ambulatory Visit: Payer: Self-pay

## 2020-10-20 ENCOUNTER — Ambulatory Visit (INDEPENDENT_AMBULATORY_CARE_PROVIDER_SITE_OTHER): Payer: Medicare Other | Admitting: Otolaryngology

## 2020-10-20 VITALS — Temp 97.2°F

## 2020-10-20 DIAGNOSIS — L089 Local infection of the skin and subcutaneous tissue, unspecified: Secondary | ICD-10-CM

## 2020-10-20 DIAGNOSIS — D37030 Neoplasm of uncertain behavior of the parotid salivary glands: Secondary | ICD-10-CM

## 2020-10-20 DIAGNOSIS — L723 Sebaceous cyst: Secondary | ICD-10-CM | POA: Diagnosis not present

## 2020-10-20 DIAGNOSIS — K118 Other diseases of salivary glands: Secondary | ICD-10-CM | POA: Diagnosis not present

## 2020-10-20 NOTE — Progress Notes (Signed)
HPI: Theresa Fernandez is a 65 y.o. female who presents is referred by Simona Huh FNP-C at [facility] for evaluation of chronic left parotid gland swelling and pain that she has had for several months or years.  She states that sometimes it swells up really big it is really painful.  It is mildly swollen today. 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  She also has history of congestive heart failure.. She also complains of a cyst behind her left ear that has been painful. She has been on antibiotics several times in the past which not really seem to help with the parotid gland swelling and pain.  Past Medical History:  Diagnosis Date  . Allergy   . Anxiety   . Arthritis   . CHF (congestive heart failure) ([place])   . COPD (chronic obstructive pulmonary disease) ([place])   . Depression   . Diabetes mellitus without complication ([place])   . HTN (hypertension) 01/12/2013   pt denies   Past Surgical History:  Procedure Laterality Date  . BIOPSY  08/01/2020   Procedure: BIOPSY;  Surgeon: Milus Banister, MD;  Location: [facility] ENDOSCOPY;  Service: Endoscopy;;  . ESOPHAGOGASTRODUODENOSCOPY (EGD) WITH PROPOFOL N/A 08/01/2020   Procedure: ESOPHAGOGASTRODUODENOSCOPY (EGD) WITH PROPOFOL;  Surgeon: Milus Banister, MD;  Location: [facility] ENDOSCOPY;  Service: Endoscopy;  Laterality: N/A;  . MASTOIDECTOMY    . TONSILLECTOMY    . TUBAL LIGATION     Social History   Socioeconomic History  . Marital status: Married    Spouse name: Not on file  . Number of children: Not on file  . Years of education: Not on file  . Highest education level: Not on file  Occupational History  . Not on file  Tobacco Use  . Smoking status: Former Smoker    Packs/day: 3.00    Years: 50.00    Pack years: 150.00    Types: Cigarettes    Quit date: 05/30/2018    Years since quitting: 2.3  . Smokeless tobacco: Never Used  Vaping Use  . Vaping Use: Never used   Substance and Sexual Activity  . Alcohol use: No  . Drug use: No  . Sexual activity: Not on file  Other Topics Concern  . Not on file  Social History Narrative  . Not on file   Social Determinants of Health   Financial Resource Strain: Not on file  Food Insecurity: Not on file  Transportation Needs: Not on file  Physical Activity: Not on file  Stress: Not on file  Social Connections: Not on file   Family History  Problem Relation Age of Onset  . Diabetes Mother   . Alcohol abuse Father   . Diabetes Father   . Stroke Brother    Allergies  Allergen Reactions  . Ceftriaxone Hives and Rash    After second dose on 06/07/2018,  . Topamax [Topiramate] Other (See Comments)    Made headache worse  . Capsaicin Rash  . Voltaren [Diclofenac Sodium] Other (See Comments)    Caused migraine   Prior to Admission medications   Medication Sig Start Date End Date Taking? Authorizing Provider  albuterol (ACCUNEB) 0.63 MG/3ML nebulizer solution Take 3 mLs (0.63 mg total) by nebulization every 4 (four) hours as needed for wheezing or shortness of breath. 07/17/20   Icard, Octavio Graves, DO  albuterol (VENTOLIN HFA) 108 (90 Base) MCG/ACT inhaler Inhale 2 puffs into the  lungs every 6 (six) hours as needed for wheezing or shortness of breath (wheezing). 07/17/20   Icard, Octavio Graves, DO  ALPRAZolam Duanne Moron) 0.5 MG tablet Take 0.5 mg by mouth at bedtime.  11/28/19   [provider]  amphetamine-dextroamphetamine (ADDERALL) 20 MG tablet Take 1 tablet (20 mg total) by mouth 3 (three) times daily. Patient not taking: Reported on 07/29/2020 01/12/13   Theodis Blaze, MD  busPIRone (BUSPAR) 15 MG tablet Take 30 mg by mouth at bedtime.     [provider]  carisoprodol (SOMA) 250 MG tablet Take 250 mg by mouth See admin instructions. Take one tablet (250 mg) by mouth twice daily - 7pm and midnight 11/27/19   [provider]  furosemide (LASIX) 40 MG tablet Take 1 tablet (40 mg total) by  mouth 2 (two) times daily. Patient not taking: Reported on 07/29/2020 10/19/18   [name], MD  HYDROcodone-acetaminophen (NORCO) 10-325 MG tablet Take 1 tablet by mouth every 6 (six) hours as needed (pain).     [provider]  hydrOXYzine (ATARAX/VISTARIL) 25 MG tablet Take 25 mg by mouth at bedtime.  04/21/18   [provider]  lamoTRIgine (LAMICTAL) 25 MG tablet Take 25 mg by mouth at bedtime.  04/21/18   [provider]  Lancets (FREESTYLE) lancets 1 each daily. 02/20/20   [provider]  lisinopril (PRINIVIL,ZESTRIL) 2.5 MG tablet Take 2.5 mg by mouth daily. For kidney function    [provider]  lovastatin (MEVACOR) 40 MG tablet Take 80 mg by mouth daily. 03/18/20   [provider]  Magnesium 500 MG TABS Take 500-1,000 mg by mouth See admin instructions. Take 1 tablet (500 mg) by mouth every morning and 2 tablets (1000 mg) at night    [provider]  metolazone (ZAROXOLYN) 2.5 MG tablet Take 1 tablet (2.5 mg total) by mouth every other day. As needed for weight gain of 2 lbs 10/23/18 04/25/20  Hilty, Nadean Corwin, MD  ondansetron (ZOFRAN) 8 MG tablet Take 1 tablet (8 mg total) by mouth every 8 (eight) hours as needed for nausea or vomiting. 01/01/20   Daleen Bo, MD  pantoprazole (PROTONIX) 40 MG tablet Take 1 tablet (40 mg total) by mouth 2 (two) times daily. 08/01/20 08/01/21  Swayze, Ava, DO  pioglitazone (ACTOS) 15 MG tablet Take 15 mg by mouth daily. 09/13/19   [provider]  prazosin (MINIPRESS) 5 MG capsule Take 5 mg by mouth at bedtime. 12/04/19   [provider]  pregabalin (LYRICA) 150 MG capsule Take 1 capsule (150 mg total) by mouth at bedtime. Patient taking differently: Take 100 mg by mouth 2 (two) times daily. 02/11/13   Johnson, Clanford L, MD  sertraline (ZOLOFT) 100 MG tablet Take 200 mg by mouth at bedtime.     [provider]  Tiotropium Bromide-Olodaterol (STIOLTO RESPIMAT) 2.5-2.5  MCG/ACT AERS Inhale 2 puffs into the lungs daily. 07/17/20   Garner Nash, DO  Tiotropium Bromide-Olodaterol 2.5-2.5 MCG/ACT AERS Inhale 2 puffs into the lungs daily. 07/17/20   Garner Nash, DO  Vitamin D, Ergocalciferol, (DRISDOL) 1.25 MG (50000 UNIT) CAPS capsule Take 50,000 Units by mouth every Tuesday.    [provider]  budesonide-formoterol (SYMBICORT) 80-4.5 MCG/ACT inhaler Inhale 2 puffs into the lungs 2 (two) times daily. Patient not taking: No sig reported 07/20/18 11/23/19  Martyn Ehrich, NP     Positive ROS: Otherwise negative  All other systems have been reviewed and were  otherwise negative with the exception of those mentioned in the HPI and as above.  Physical Exam: Constitutional: Alert, well-appearing, no acute distress Ears: External ears without lesions or tenderness. Ear canals are clear bilaterally with intact, clear TMs bilaterally. Nasal: External nose without lesions. Septum relatively midline with mild rhinitis but clear nasal passages bilaterally with no signs of infection..  Oral: Lips and gums without lesions. Tongue and palate mucosa without lesions. Posterior oropharynx clear.  No intraoral mucosal lesions noted.  No obvious purulent discharge from the parotid duct or submandibular duct.. Neck: She has an enlarged left painful parotid mass.  Of note she also has a small sebaceous cyst behind the left ear superiorly that is red and inflamed.  She has no palpable adenopathy lower the neck on the left side along the jugular chain to assess for change of lymph nodes. Facial nerve function: Of concern she does have weakness of the left marginal mandibular nerve with slight elevation of the lower lip of the left side.  She has normal facial nerve function of the upper branches of the facial nerve. She also has slight enlargement of left submandibular gland but this is not tender. Respiratory: Breathing comfortably  Skin: No facial/neck lesions or  rash noted.  Procedures  Assessment: Questionable left parotid gland infection versus neoplasm. Infected sebaceous cyst behind the left ear.  Plan: Placed her on antibiotics Keflex 500 mg 3 times daily for 2 weeks.  I discussed with her concerning drinking plenty of liquids. We will plan on scheduling a CT scan of her neck to better evaluate the left parotid mass in the next week or 2 and she will follow up here in 2 weeks for recheck.   Radene Journey, MD   CC:

## 2020-10-26 NOTE — Telephone Encounter (Signed)
Theresa Fernandez, please advise if this is taken care of. Thanks.

## 2020-10-26 NOTE — Telephone Encounter (Signed)
Spoke with Melissa from [employer] in regards to this message to see if she can see what needs to be done and if there is a CMN still needed and if she can fax it to [place] to get taken care of. Advised her I sent Denny Peon a community message also. She states that she will look into it and if anything is needed she will fax it over to [place]. Will route message to [place] as FYI ONLY

## 2020-10-26 NOTE — Telephone Encounter (Signed)
No I have never got one since 08/2020 which I sent back on 09/01/20 they can fax it to [phone]

## 2020-10-27 NOTE — Telephone Encounter (Signed)
im going to close the note I dont need to hold onto it for FYI when I get CMN I will send it to the doc

## 2020-11-01 ENCOUNTER — Other Ambulatory Visit: Payer: Self-pay

## 2020-11-01 ENCOUNTER — Ambulatory Visit
Admission: RE | Admit: 2020-11-01 | Discharge: 2020-11-01 | Disposition: A | Discharge status: Home / Self Care | Payer: Medicare Other | Source: Ambulatory Visit | Attending: Otolaryngology | Admitting: Otolaryngology

## 2020-11-01 DIAGNOSIS — D37030 Neoplasm of uncertain behavior of the parotid salivary glands: Secondary | ICD-10-CM

## 2020-11-01 IMAGING — CT CT NECK W/ CM
3 of 4 series · 6 of 14 positions shown, 7 images · IV contrast (iopamidol)
Comparison: None.

CLINICAL DATA: Lump on left side of neck injury here for 5 years

EXAM:
CT NECK WITH CONTRAST
TECHNIQUE: Multidetector CT imaging of the neck was performed using the
standard protocol following the bolus administration of intravenous
contrast.
CONTRAST:  60mL [78] IOPAMIDOL ([78]) INJECTION 61%

[Series 3: neck · axial · 0.51mm/px · z∈[-240,-162]mm · 2 of 119 slices shown]
[im 40/119  bone]
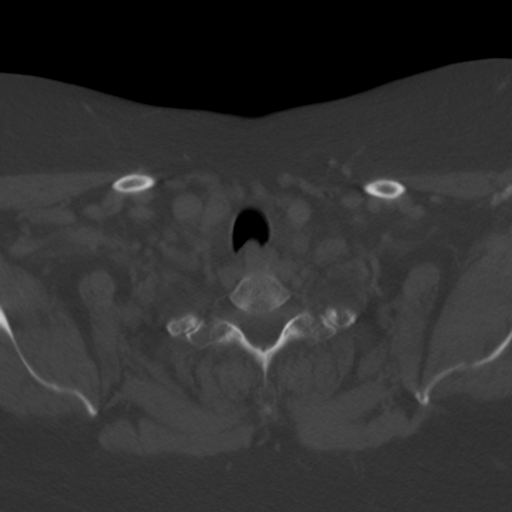
[im 79/119  bone]
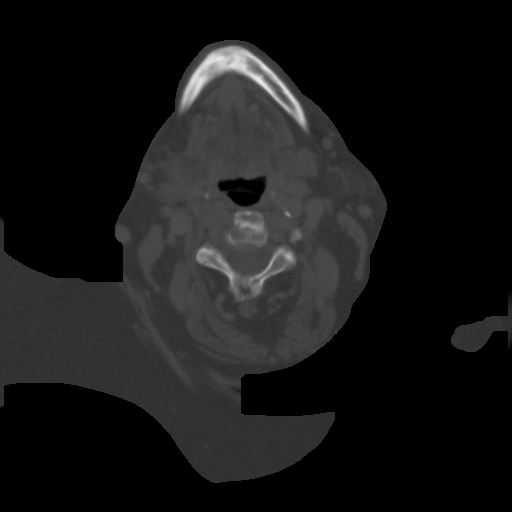

[Series 8: angled axial-oropharynx · axial · 0.53mm/px · z∈[-257,-177]mm · 2 of 121 slices shown, 3 images]
[im 41/121  soft-tissue]
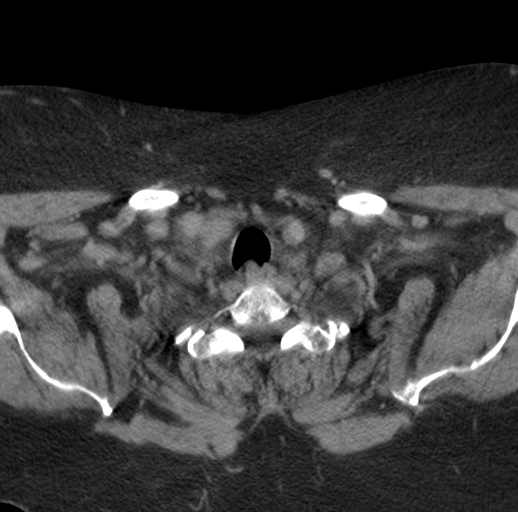
[im 41/121  bone]
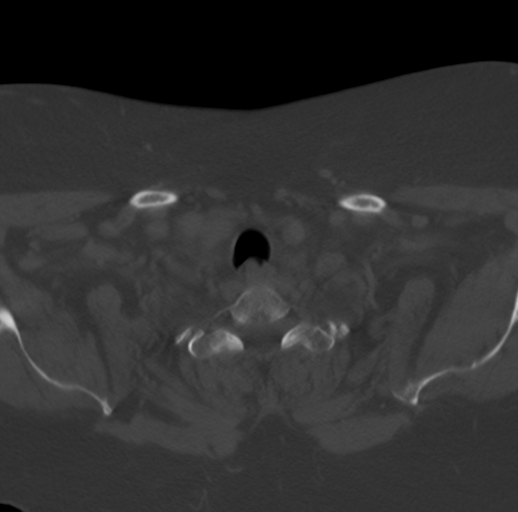
[im 81/121  bone]
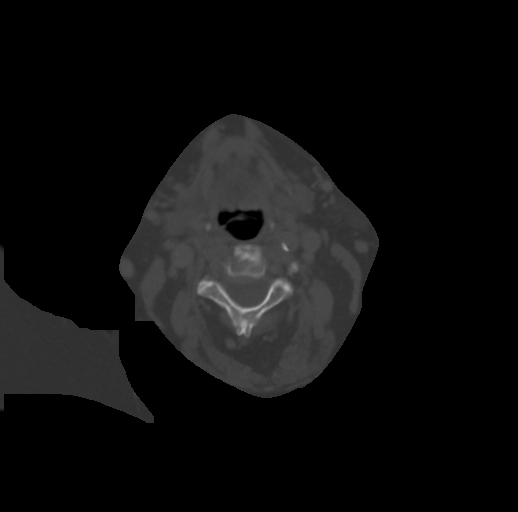

[Series 9: angled (person_name) · axial · 0.53mm/px · z∈[-257,-177]mm · 2 of 121 slices shown]
[im 41/121  bone]
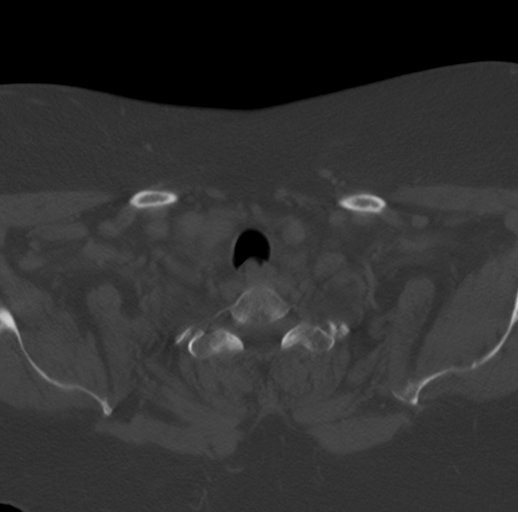
[im 81/121  bone]
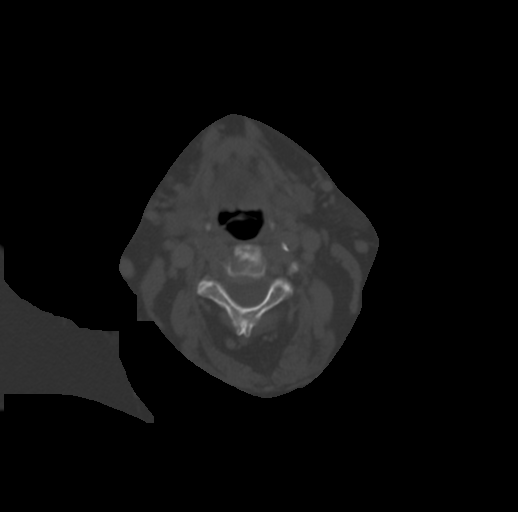

[6 of 14 positions shown; findings below may reference images not displayed]

FINDINGS: Images are degraded by motion artifact and poor soft tissue
enhancement.

Pharynx and larynx: No visible mass or inflammation.

Salivary glands: Mass arising from the deep lobe left parotid with
posterior growth into the temporal bone, entering the mastoid tip
via direct invasion and completely covering the stylomastoid fat
pad. No bony erosion is seen at the level of the otic capsule or
descending facial nerve canal. Maximal dimensions measure up to
x 2.8 cm. There is broad contact with the styloid process.

Thyroid: Negative

Lymph nodes: None enlarged or abnormal density.

Vascular: Negative.

Limited intracranial: Negative.

Visualized orbits: Negative.

Mastoids and visualized paranasal sinuses: Opacification and erosion
at the left mastoid tip. No evidence of hematogenous metastatic
disease.

Skeleton: Negative

Upper chest: Irregular pulmonary nodule seen in the bilateral lung.
A left upper lobe nodule anteriorly measures 12 mm on [DATE]

These results will be called to the ordering clinician or
representative by the Radiologist Assistant, and communication
documented in the PACS or [REDACTED].
IMPRESSION: 1. Aggressive deep lobe left parotid mass with direct invasion of
the left mastoid tip and obliteration of the stylomastoid fat pad,
recommend MRI to evaluate for perineural tumor spread.
2. No adenopathy in the neck but there are pulmonary nodules
suggesting distant metastatic disease.

## 2020-11-01 MED ORDER — IOPAMIDOL (ISOVUE-300) INJECTION 61%
60.0000 mL | Freq: Once | INTRAVENOUS | Status: AC | PRN
  Administered 2020-11-01: 60 mL via INTRAVENOUS

## 2020-11-02 ENCOUNTER — Telehealth (INDEPENDENT_AMBULATORY_CARE_PROVIDER_SITE_OTHER): Payer: Self-pay | Admitting: Otolaryngology

## 2020-11-02 ENCOUNTER — Other Ambulatory Visit: Payer: Self-pay | Admitting: Otolaryngology

## 2020-11-02 ENCOUNTER — Other Ambulatory Visit (INDEPENDENT_AMBULATORY_CARE_PROVIDER_SITE_OTHER): Payer: Self-pay

## 2020-11-02 DIAGNOSIS — C801 Malignant (primary) neoplasm, unspecified: Secondary | ICD-10-CM

## 2020-11-02 NOTE — Telephone Encounter (Signed)
I was notified earlier this morning concerning results of patient's CT scan by radiology.  This apparently demonstrated an aggressive deep lobe parotid mass that extends up to the stylomastoid foramen and erodes some of the bone of the mastoid tip.  There was also a question about new lung nodules. I called Dr. Conley Canal at [facility] concerning seeing this patient for possible surgical intervention.  He has agreed to see this patient this next Monday and I arranged the appointment with his office and contacted the patient concerning this appointment at 8 AM Monday morning. 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  I discussed with her concerning seeing Dr. Conley Canal concerning further surgery and treatment of this tumor. He asked that I schedule a CT scan of her chest and we will plan on scheduling this.

## 2020-11-03 ENCOUNTER — Ambulatory Visit (INDEPENDENT_AMBULATORY_CARE_PROVIDER_SITE_OTHER): Payer: Medicare Other | Admitting: Otolaryngology

## 2020-11-06 ENCOUNTER — Telehealth (INDEPENDENT_AMBULATORY_CARE_PROVIDER_SITE_OTHER): Payer: Self-pay

## 2020-11-06 NOTE — Telephone Encounter (Signed)
Pt has referral faxed to [facility], Dr. Aida Puffer, MD. Fax # [phone] and phone [phone]. Lung METS

## 2020-11-15 ENCOUNTER — Other Ambulatory Visit: Payer: Medicare Other

## 2020-12-25 ENCOUNTER — Ambulatory Visit
Admission: RE | Admit: 2020-12-25 | Discharge: 2020-12-25 | Disposition: A | Discharge status: Home / Self Care | Payer: Self-pay | Source: Ambulatory Visit | Attending: Radiation Oncology | Admitting: Radiation Oncology

## 2020-12-25 ENCOUNTER — Other Ambulatory Visit: Payer: Self-pay | Admitting: Radiation Oncology

## 2020-12-25 DIAGNOSIS — K118 Other diseases of salivary glands: Secondary | ICD-10-CM

## 2020-12-26 NOTE — Progress Notes (Signed)
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 Adenoid cystic carcinoma of parotid gland ([place])  C07   2. Malignant neoplasm metastatic to both lungs (HCC)  C78.01    C78.02   3. Malignant neoplasm of parotid gland (HCC)  C07    CHIEF COMPLAINT: Here to discuss management of parotid gland cancer with lung metastases.  HISTORY OF PRESENT ILLNESS::Theresa Fernandez is a 65 y.o. female who presented with a 5 year history of recurrent preauricular swelling. Neck CT imaging from 11/01/20 showed an aggressive deep lobe left parotid mass with direct invasion of the left mastoid tip. At the time no adenopathy was detected in the neck however there were pulmonary nodules suggesting distant metastatic disease.   Subsequently, the patient saw Dr. Conley Canal on 11/06/20, who informed the patient of his impression of cancer to the parotid gland.    Dr. Conley Canal recommended biopsy of the left parotid gland for a definitive diagnosis.   Biopsy of left parotid gland on 11/06/20 revealed: adenoid cystic carcinoma.   Pertinent imaging thus far includes a CT of the chest/ abdomen/pelvis with out contrast performed on 11/06/20; revealing extensive pulmonary metastatic disease; numerous bilateral rounded pulmonary nodules involving all pulmonary lobes were noted. It was also noted that the mediastinal adenopathy could be due to metastatic disease.   I have personally reviewed her neck and chest images.  An additional biopsy was performed on 10/2820. Pathology results indicated adenoid cystic carcinoma. It was also noted that the tumor invades the dermal  skin.  The patient followed up with Dr. Conley Canal on 11/24/20. During the visit, the extent of the cancer was conveyed to the patient and her husband as well as a treatment recommendation of radiation therapy. Dr. Conley Canal informed the patient that in her current condition, her expected survival is less than 5 years in association with radiation treatment. Alternatives to radiation were discussed with the patient and she was encouraged to meet with Dr. Ysidro Evert to discuss this further. The patient then met with Dr. Ysidro Evert on 12/22/20 in which he informed the patient that her lung cancer is too extensive to treat with radiation, however radiation to the primary site could help reduce the pain she is experiencing.    Swallowing issues, if any: every now and then she has some trouble swallowing   Weight Changes: N/A  Pain status: patient reports having pain to the whole left side of her face including preauricular, postauricular and in her mouth. She states that it hurts to talk and eat.  Other symptoms: patient is noted to have dry mouth due to snoring and lasix, and has a mild change in her voice from time to time that resolves on its own. The patient reports that her most prominent symptom is " first bite pain". Also of note: the patient is capable of self care but unable to carry out any work activities.  Tobacco history, if any: former smoker; smoked 3 packs a day for 50 years (150 pack years). Patient quit smoking on 05/30/18.  She is on oxygen per nasal cannula.  She has COPD.  She feels that she has an upper respiratory infection  and is planning to reach out to her primary doctor to address this.  She has tested negative for COVID at home.  ETOH abuse, if any: none  Prior cancers, if any: none  She uses a walker to ambulate  PREVIOUS RADIATION THERAPY: No  PAST MEDICAL HISTORY:  has a past medical history of Allergy, Anxiety, Arthritis, CHF (congestive heart failure) ([place]), COPD (chronic  obstructive pulmonary disease) ([place]), Depression, Diabetes mellitus without complication ([place]), and HTN (hypertension) (01/12/2013).    PAST SURGICAL HISTORY: Past Surgical History:  Procedure Laterality Date  . BIOPSY  08/01/2020   Procedure: BIOPSY;  Surgeon: Milus Banister, MD;  Location: [facility] END[facility]COPY;  Service: Endoscopy;;  . ESOPHAGOGASTRODUODENOSCOPY (EGD) WITH PROPOFOL N/A 08/01/2020   Procedure: ESOPHAGOGASTRODUODENOSCOPY (EGD) WITH PROPOFOL;  Surgeon: Milus Banister, MD;  Location: [facility] ENDOSCOPY;  Service: Endoscopy;  Laterality: N/A;  . MASTOIDECTOMY    . TONSILLECTOMY    . TUBAL LIGATION      FAMILY HISTORY: family history includes Alcohol abuse in her father; Diabetes in her father and mother; Stroke in her brother.  SOCIAL HISTORY:  reports that she quit smoking about 2 years ago. Her smoking use included cigarettes. She has a 150.00 pack-year smoking history. She has never used smokeless tobacco. She reports that she does not drink alcohol and does not use drugs.  ALLERGIES: Ceftriaxone, Topamax [topiramate], Capsaicin, and Voltaren [diclofenac sodium]  MEDICATIONS:  Current Outpatient Medications  Medication Sig Dispense Refill  . fluticasone-salmeterol (ADVAIR HFA) 115-21 MCG/ACT inhaler Inhale 1 puff into the lungs daily as needed.    . pregabalin (LYRICA) 150 MG capsule Take 1 capsule by mouth daily.    Marland Kitchen albuterol (ACCUNEB) 0.63 MG/3ML nebulizer solution Take 3 mLs (0.63 mg total) by nebulization every 4 (four) hours as needed for wheezing or shortness of breath. 75 mL 11  . albuterol (VENTOLIN HFA) 108 (90 Base) MCG/ACT inhaler Inhale 2 puffs into the lungs every 6 (six) hours as needed for wheezing or shortness of breath (wheezing). 18 g 6  . ALPRAZolam (XANAX) 0.5 MG tablet Take 0.5 mg by mouth at bedtime.     Marland Kitchen amphetamine-dextroamphetamine (ADDERALL) 20 MG tablet Take 1 tablet (20 mg total) by mouth 3 (three) times daily. (Patient not taking: Reported on 07/29/2020)  90 tablet 0  . busPIRone (BUSPAR) 15 MG tablet Take 30 mg by mouth at bedtime.     . carisoprodol (SOMA) 250 MG tablet Take 250 mg by mouth See admin instructions. Take one tablet (250 mg) by mouth twice daily - 7pm and midnight    . furosemide (LASIX) 40 MG tablet Take 1 tablet (40 mg total) by mouth 2 (two) times daily. (Patient not taking: Reported on 07/29/2020) 90 tablet 3  . HYDROcodone-acetaminophen (NORCO) 10-325 MG tablet Take 1 tablet by mouth every 6 (six) hours as needed (pain).     . hydrOXYzine (ATARAX/VISTARIL) 25 MG tablet Take 25 mg by mouth at bedtime.     . lamoTRIgine (LAMICTAL) 25 MG tablet Take 25 mg by mouth at bedtime.     . Lancets (FREESTYLE) lancets 1 each daily.    Marland Kitchen levothyroxine (SYNTHROID) 25 MCG tablet Take 25 mcg by mouth daily.    Marland Kitchen lisinopril (PRINIVIL,ZESTRIL) 2.5 MG tablet Take 2.5 mg by mouth daily. For kidney function    . lovastatin (MEVACOR) 40 MG tablet Take 80 mg by mouth daily.    . Magnesium 500 MG TABS Take 500-1,000 mg by mouth See admin instructions. Take 1  tablet (500 mg) by mouth every morning and 2 tablets (1000 mg) at night    . metolazone (ZAROXOLYN) 2.5 MG tablet Take 1 tablet (2.5 mg total) by mouth every other day. As needed for weight gain of 2 lbs 45 tablet 3  . ondansetron (ZOFRAN) 8 MG tablet Take 1 tablet (8 mg total) by mouth every 8 (eight) hours as needed for nausea or vomiting. 20 tablet 0  . pantoprazole (PROTONIX) 40 MG tablet Take 1 tablet (40 mg total) by mouth 2 (two) times daily. 30 tablet 1  . pioglitazone (ACTOS) 15 MG tablet Take 15 mg by mouth daily.    . prazosin (MINIPRESS) 5 MG capsule Take 5 mg by mouth at bedtime.    . pregabalin (LYRICA) 150 MG capsule Take 1 capsule (150 mg total) by mouth at bedtime. (Patient taking differently: Take 100 mg by mouth 2 (two) times daily.) 30 capsule 3  . sertraline (ZOLOFT) 100 MG tablet Take 200 mg by mouth at bedtime.     . Tiotropium Bromide-Olodaterol (STIOLTO RESPIMAT) 2.5-2.5  MCG/ACT AERS Inhale 2 puffs into the lungs daily. 4 g 0  . Tiotropium Bromide-Olodaterol 2.5-2.5 MCG/ACT AERS Inhale 2 puffs into the lungs daily. 12 g 3  . Vitamin D, Ergocalciferol, (DRISDOL) 1.25 MG (50000 UNIT) CAPS capsule Take 50,000 Units by mouth every Tuesday.     No current facility-administered medications for this encounter.    REVIEW OF SYSTEMS:  Notable for that above.   PHYSICAL EXAM:  oral temperature is 98 F (36.7 C). Her blood pressure is 124/68 and her pulse is 79. Her respiration is 20 and oxygen saturation is 93%.   General: Alert and oriented, in no acute distress, oxygen per nasal cannula HEENT: Left tympanic membrane is erythematous and injected  Neck: Neck is notable for tenderness in the left parotid region with associated mild swelling.  I cannot palpate deeply due to the pain that she experiences.  No obvious adenopathy bilaterally. Heart: Regular in rate and rhythm with no murmurs, rubs, or gallops. Chest: Bilateral wheezes  Psychiatric: Judgment and insight are intact. Affect is appropriate.   ECOG = 3  0 - Asymptomatic (Fully active, able to carry on all predisease activities without restriction)  1 - Symptomatic but completely ambulatory (Restricted in physically strenuous activity but ambulatory and able to carry out work of a light or sedentary nature. For example, light housework, office work)  2 - Symptomatic, <50% in bed during the day (Ambulatory and capable of all self care but unable to carry out any work activities. Up and about more than 50% of waking hours)  3 - Symptomatic, >50% in bed, but not bedbound (Capable of only limited self-care, confined to bed or chair 50% or more of waking hours)  4 - Bedbound (Completely disabled. Cannot carry on any self-care. Totally confined to bed or chair)  5 - Death   Santiago Glad MM, Creech RH, Tormey DC, et al. [phone]). "Toxicity and response criteria of the [facility]". Am. Evlyn Clines.  Oncol. 5 (6): 649-55   LABORATORY DATA:  Lab Results  Component Value Date   WBC 10.0 07/31/2020   HGB 11.7 (L) 08/01/2020   HCT 38.0 08/01/2020   MCV 95.1 07/31/2020   PLT 272 07/31/2020   CMP     Component Value Date/Time   NA 141 07/31/2020 0201   NA 145 (H) 10/21/2018 1431   K 4.4 07/31/2020 0201   CL 102 07/31/2020 0201  CO2 23 07/31/2020 0201   GLUCOSE 104 (H) 07/31/2020 0201   BUN 15 07/31/2020 0201   BUN 34 (H) 10/21/2018 1431   CREATININE 1.21 (H) 07/31/2020 0201   CREATININE 0.89 02/11/2013 0922   CALCIUM 8.8 (L) 07/31/2020 0201   PROT 6.7 07/30/2020 0339   ALBUMIN 3.4 (L) 07/30/2020 0339   AST 25 07/30/2020 0339   ALT 16 07/30/2020 0339   ALKPHOS 91 07/30/2020 0339   BILITOT 0.7 07/30/2020 0339   GFRNONAA 50 (L) 07/31/2020 0201   GFRNONAA 72 02/11/2013 0922   GFRAA 49 (L) 01/01/2020 1039   GFRAA 83 02/11/2013 0922      Lab Results  Component Value Date   TSH 2.537 07/30/2020     RADIOGRAPHY: As above   IMPRESSION/PLAN: Stage IVC pT4a cN0M1 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  She has associated pain with the left parotid primary mass.  I recommend 2 weeks of palliative radiotherapy to the primary site   We discussed the potential risks, benefits, and side effects of radiotherapy. We talked in detail about acute and late effects. We discussed that some of the most bothersome acute effects may be mucositis, dysgeusia, salivary changes, skin irritation, hair loss,   fatigue. We talked about late effects which include but are not necessarily limited to hearing loss, xerostomia, and potential injury to any of the tissues in the head and neck region. No guarantees of treatment were given. A consent form was signed and placed in the patient's medical record. The patient  is enthusiastic about proceeding with treatment. I look forward to participating in the patient's care.    Simulation (treatment planning) will take place on June 14  She anticipates anupcoming consultation with medical oncology at [facility] regarding her systemic disease.   Our patient navigator, Anderson Malta, was present for the consultation.  The patient and her significant other are pleased with the above plan.  On date of service, in total, I spent 50 minutes on this encounter - this includes review of records, counseling with the patient, coordination of care, and documentation. Patient was seen in person.  __________________________________________   Eppie Gibson, MD   This document serves as a record of services personally performed by Eppie Gibson, MD. It was created on her behalf by Roney Mans, a trained medical scribe. The creation of this record is based on the scribe's personal observations and the provider's statements to them. This document has been checked and approved by the attending provider.

## 2020-12-26 NOTE — Progress Notes (Signed)
Head and Neck Cancer Location of Tumor / Histology:  Adenoid cystic cacinoma or LEFT parotid glad with pulmonary metastasis  Patient presented with symptoms of: parotid gland swelling and pain that she has had for several months or years.  She states that sometimes it swells up really big it is really painful  CT C/A/P w/o Contrast --11/06/2020 1. Findings as detailed above concerning for extensive pulmonary metastatic disease in this patient with recently diagnosed primary parotid gland neoplasm. Mediastinal adenopathy could be due to metastatic disease. Attention on a follow-up exam.  2. Multilevel vertebral body collapse with vertebral/kyphoplasty. No assess associated soft tissue mass. This could be due to osteoporotic fractures. Attention on a follow-up exam for metastatic disease (although it is possibly less likely). Otherwise no acute or metastatic disease in the chest/abdomen/pelvis.  Biopsies revealed:  11/16/2020 LEFT PAROTID TUMOR POSTAURICULAR, EXCISIONAL BIOPSY: --Adenoid cystic carcinoma, cribriform pattern with approximately 5% solid component. --Tumor invades the dermal skin. --Perineural invasion is present.              See comments.  --Comment   The specimen is an excisional biopsy, so a CAP tumor protocol is not provided.  Based on the current specimen, the tumor is pT4a. Block(s) with tumor for ancillary testing: A4 and A5.  Nutrition Status Yes No Comments  Weight changes? []  [x]    Swallowing concerns? []  [x]  Does report "first bite syndrome" which she describes as intense/stabbing pain with her first bite of food. States it dissipates as she eats  PEG? []  [x]     Referrals Yes No Comments  Social Work? [x]  []    Dentistry? [x]  []    Swallowing therapy? [x]  []    Nutrition? [x]  []    Med/Onc? []  [x]     Safety Issues Yes No Comments  Prior radiation? []  [x]    Pacemaker/ICD? []  [x]    Possible current pregnancy? []  [x]  Postmenopausal   Is the patient on  methotrexate? []  [x]     Tobacco/Marijuana/Snuff/ETOH use: Former smoker (quit in 2019). Never used smokeless tobacco, and denies any alcohol or recreational drug use  Past/Anticipated interventions by otolaryngology, if any:  11/16/2020 Dr. Melony Overly 1. Excisional biopsy of parotid cancer  2. Local advancement flap reconstruction of postauricular skin defect (defect size = 2.5 x 5 cm)  Past/Anticipated interventions by medical oncology, if any:  No referral indicated at this time   Current Complaints / other details:  Patient as received the first 3 Moderna vaccines. Currently recovering from an URI. Reports it's painful to open her mouth to yawn or when she sneezes. She also experiences chronic headaches. States OTC Tylenol helps somewhat, but ibuprofen is the most effective (though she was told not to take ibuprofen any longer due to gastric ulcers)

## 2020-12-27 ENCOUNTER — Ambulatory Visit
Admission: RE | Admit: 2020-12-27 | Discharge: 2020-12-27 | Disposition: A | Discharge status: Home / Self Care | Payer: Medicare Other | Source: Ambulatory Visit | Attending: Radiation Oncology | Admitting: Radiation Oncology

## 2020-12-27 ENCOUNTER — Other Ambulatory Visit: Payer: Self-pay

## 2020-12-27 ENCOUNTER — Encounter: Payer: Self-pay | Admitting: Radiation Oncology

## 2020-12-27 VITALS — BP 124/68 | HR 79 | Temp 98.0°F | Resp 20

## 2020-12-27 DIAGNOSIS — F329 Major depressive disorder, single episode, unspecified: Secondary | ICD-10-CM | POA: Insufficient documentation

## 2020-12-27 DIAGNOSIS — J449 Chronic obstructive pulmonary disease, unspecified: Secondary | ICD-10-CM | POA: Diagnosis not present

## 2020-12-27 DIAGNOSIS — C7801 Secondary malignant neoplasm of right lung: Secondary | ICD-10-CM | POA: Insufficient documentation

## 2020-12-27 DIAGNOSIS — I10 Essential (primary) hypertension: Secondary | ICD-10-CM | POA: Diagnosis not present

## 2020-12-27 DIAGNOSIS — C7802 Secondary malignant neoplasm of left lung: Secondary | ICD-10-CM

## 2020-12-27 DIAGNOSIS — M129 Arthropathy, unspecified: Secondary | ICD-10-CM | POA: Insufficient documentation

## 2020-12-27 DIAGNOSIS — C07 Malignant neoplasm of parotid gland: Secondary | ICD-10-CM | POA: Insufficient documentation

## 2020-12-27 DIAGNOSIS — Z87891 Personal history of nicotine dependence: Secondary | ICD-10-CM | POA: Diagnosis not present

## 2020-12-27 DIAGNOSIS — Z79899 Other long term (current) drug therapy: Secondary | ICD-10-CM | POA: Insufficient documentation

## 2020-12-27 DIAGNOSIS — E119 Type 2 diabetes mellitus without complications: Secondary | ICD-10-CM | POA: Insufficient documentation

## 2020-12-27 DIAGNOSIS — I509 Heart failure, unspecified: Secondary | ICD-10-CM | POA: Insufficient documentation

## 2020-12-27 NOTE — Progress Notes (Signed)
Oncology Nurse Navigator Documentation  Met with patient during initial consult with Dr. Isidore Moos. She was accompanied by her husband Harrell Gave.  . Further introduced myself as his/their Navigator, explained my role as a member of the Care Team. . Provided New Patient Information packet: o Contact information for physician, this navigator, other members of the Care Team o Advance Directive information ([place] blue pamphlet with LCSW insert); provided [facility] AD booklet at his request,  o Fall Prevention Patient [place] sheet o Symptom Management Clinic information o [facility] campus map with highlight of [place] . Assisted with post-consult appt scheduling. . They verbalized understanding of information provided. . I encouraged them to call with questions/concerns moving forward.  Harlow Asa, RN, BSN, OCN Head & Neck Oncology Nurse [place] at [place] [phone]

## 2021-01-02 ENCOUNTER — Ambulatory Visit
Admission: RE | Admit: 2021-01-02 | Discharge: 2021-01-02 | Disposition: A | Discharge status: Home / Self Care | Payer: Medicare Other | Source: Ambulatory Visit | Attending: Radiation Oncology | Admitting: Radiation Oncology

## 2021-01-02 ENCOUNTER — Other Ambulatory Visit: Payer: Self-pay

## 2021-01-02 DIAGNOSIS — Z510 Encounter for antineoplastic radiation therapy: Secondary | ICD-10-CM | POA: Insufficient documentation

## 2021-01-02 DIAGNOSIS — C7801 Secondary malignant neoplasm of right lung: Secondary | ICD-10-CM | POA: Insufficient documentation

## 2021-01-02 DIAGNOSIS — C7802 Secondary malignant neoplasm of left lung: Secondary | ICD-10-CM | POA: Diagnosis not present

## 2021-01-02 DIAGNOSIS — C07 Malignant neoplasm of parotid gland: Secondary | ICD-10-CM | POA: Diagnosis present

## 2021-01-02 DIAGNOSIS — Z87891 Personal history of nicotine dependence: Secondary | ICD-10-CM | POA: Insufficient documentation

## 2021-01-02 NOTE — Progress Notes (Signed)
Oncology Nurse Navigator Documentation   To provide support, encouragement and care continuity, met with Ms. Bently and her husband during her CT SIM. She tolerated procedure without difficulty, denied questions/concerns.   II encouraged her to call me prior to 01/08/21 [place].   Harlow Asa RN, BSN, OCN Head & Neck Oncology Nurse [place] at [facility] Phone # [phone]  Fax # [phone]

## 2021-01-05 DIAGNOSIS — Z510 Encounter for antineoplastic radiation therapy: Secondary | ICD-10-CM | POA: Diagnosis not present

## 2021-01-08 ENCOUNTER — Ambulatory Visit
Admission: RE | Admit: 2021-01-08 | Discharge: 2021-01-08 | Disposition: A | Discharge status: Home / Self Care | Payer: Medicare Other | Source: Ambulatory Visit | Attending: Radiation Oncology | Admitting: Radiation Oncology

## 2021-01-08 ENCOUNTER — Other Ambulatory Visit: Payer: Self-pay

## 2021-01-08 DIAGNOSIS — Z510 Encounter for antineoplastic radiation therapy: Secondary | ICD-10-CM | POA: Diagnosis not present

## 2021-01-09 ENCOUNTER — Ambulatory Visit
Admission: RE | Admit: 2021-01-09 | Discharge: 2021-01-09 | Disposition: A | Discharge status: Home / Self Care | Payer: Medicare Other | Source: Ambulatory Visit | Attending: Radiation Oncology | Admitting: Radiation Oncology

## 2021-01-09 ENCOUNTER — Telehealth: Payer: Self-pay | Admitting: *Deleted

## 2021-01-09 DIAGNOSIS — Z510 Encounter for antineoplastic radiation therapy: Secondary | ICD-10-CM | POA: Diagnosis not present

## 2021-01-09 NOTE — Telephone Encounter (Signed)
Called patient to inform of fu appt. with Dr. Isidore Moos on 02-28-21 @ 11:20 am, spoke with patient and she is aware of this appt.

## 2021-01-10 ENCOUNTER — Ambulatory Visit
Admission: RE | Admit: 2021-01-10 | Discharge: 2021-01-10 | Disposition: A | Discharge status: Home / Self Care | Payer: Medicare Other | Source: Ambulatory Visit | Attending: Radiation Oncology | Admitting: Radiation Oncology

## 2021-01-10 DIAGNOSIS — Z510 Encounter for antineoplastic radiation therapy: Secondary | ICD-10-CM | POA: Diagnosis not present

## 2021-01-11 ENCOUNTER — Ambulatory Visit
Admission: RE | Admit: 2021-01-11 | Discharge: 2021-01-11 | Disposition: A | Discharge status: Home / Self Care | Payer: Medicare Other | Source: Ambulatory Visit | Attending: Radiation Oncology | Admitting: Radiation Oncology

## 2021-01-11 DIAGNOSIS — Z510 Encounter for antineoplastic radiation therapy: Secondary | ICD-10-CM | POA: Diagnosis not present

## 2021-01-12 ENCOUNTER — Ambulatory Visit
Admission: RE | Admit: 2021-01-12 | Discharge: 2021-01-12 | Disposition: A | Discharge status: Home / Self Care | Payer: Medicare Other | Source: Ambulatory Visit | Attending: Radiation Oncology | Admitting: Radiation Oncology

## 2021-01-12 ENCOUNTER — Other Ambulatory Visit: Payer: Self-pay

## 2021-01-12 DIAGNOSIS — Z510 Encounter for antineoplastic radiation therapy: Secondary | ICD-10-CM | POA: Diagnosis not present

## 2021-01-15 ENCOUNTER — Other Ambulatory Visit: Payer: Self-pay

## 2021-01-15 ENCOUNTER — Ambulatory Visit
Admission: RE | Admit: 2021-01-15 | Discharge: 2021-01-15 | Disposition: A | Discharge status: Home / Self Care | Payer: Medicare Other | Source: Ambulatory Visit | Attending: Radiation Oncology | Admitting: Radiation Oncology

## 2021-01-15 DIAGNOSIS — Z510 Encounter for antineoplastic radiation therapy: Secondary | ICD-10-CM | POA: Diagnosis not present

## 2021-01-16 ENCOUNTER — Other Ambulatory Visit: Payer: Self-pay

## 2021-01-16 ENCOUNTER — Telehealth: Payer: Self-pay

## 2021-01-16 ENCOUNTER — Ambulatory Visit
Admission: RE | Admit: 2021-01-16 | Discharge: 2021-01-16 | Disposition: A | Discharge status: Home / Self Care | Payer: Medicare Other | Source: Ambulatory Visit | Attending: Radiation Oncology | Admitting: Radiation Oncology

## 2021-01-16 DIAGNOSIS — Z510 Encounter for antineoplastic radiation therapy: Secondary | ICD-10-CM | POA: Diagnosis not present

## 2021-01-16 NOTE — Telephone Encounter (Signed)
Nutrition Assessment   Reason for Assessment:  Head and neck cancer   ASSESSMENT:  65 year old female with left parotid adenoid cystic carcinoma with pulmonary mets.  Past medical history of COPD, CHF, DM, HTN.  Patient receiving radiation. Last treatment planned for 7/1  Spoke with patient via phone for nutrition assessment.  Patient reports that she has a good appetite.  Says that taste is a little off and having issues with dry mouth.  Husband prepares meals for her and makes sure that she eats.  Usually has bagel for breakfast. Lunch maybe peanut butter sandwich or chicken nuggets.  Dinner tonight will be chicken fajitas, pineapple with cheese and baked beans.      Medications: reviewed   Labs: reviewed   Anthropometrics:   Height: 65 inches Weight: 261 lb 8 oz  UBW: Patient reports stable weight during treatment 253 lb 07/2020   NUTRITION DIAGNOSIS: None at this time    INTERVENTION:  Discussed strategies to help with dry mouth. Says that she was given a handout on dry mouth today when seeing Dr Sondra Come.  Encouraged good sources of protein. Patient denies questions or concerns with nutrition Contact information given to patient   MONITORING, EVALUATION, GOAL: weight trends, intake   Next Visit: phone call in ~ 4 weeks  Josimar Corning B. Zenia Resides, [place], [place] Registered Dietitian [phone] (mobile)

## 2021-01-17 ENCOUNTER — Ambulatory Visit
Admission: RE | Admit: 2021-01-17 | Discharge: 2021-01-17 | Disposition: A | Discharge status: Home / Self Care | Payer: Medicare Other | Source: Ambulatory Visit | Attending: Radiation Oncology | Admitting: Radiation Oncology

## 2021-01-17 ENCOUNTER — Other Ambulatory Visit: Payer: Self-pay

## 2021-01-17 DIAGNOSIS — Z510 Encounter for antineoplastic radiation therapy: Secondary | ICD-10-CM | POA: Diagnosis not present

## 2021-01-18 ENCOUNTER — Ambulatory Visit
Admission: RE | Admit: 2021-01-18 | Discharge: 2021-01-18 | Disposition: A | Discharge status: Home / Self Care | Payer: Medicare Other | Source: Ambulatory Visit | Attending: Radiation Oncology | Admitting: Radiation Oncology

## 2021-01-18 DIAGNOSIS — Z510 Encounter for antineoplastic radiation therapy: Secondary | ICD-10-CM | POA: Diagnosis not present

## 2021-01-19 ENCOUNTER — Other Ambulatory Visit: Payer: Self-pay

## 2021-01-19 ENCOUNTER — Encounter: Payer: Self-pay | Admitting: Radiation Oncology

## 2021-01-19 ENCOUNTER — Ambulatory Visit
Admission: RE | Admit: 2021-01-19 | Discharge: 2021-01-19 | Disposition: A | Discharge status: Home / Self Care | Payer: Medicare Other | Source: Ambulatory Visit | Attending: Radiation Oncology | Admitting: Radiation Oncology

## 2021-01-19 DIAGNOSIS — C7802 Secondary malignant neoplasm of left lung: Secondary | ICD-10-CM | POA: Diagnosis not present

## 2021-01-19 DIAGNOSIS — C07 Malignant neoplasm of parotid gland: Secondary | ICD-10-CM | POA: Insufficient documentation

## 2021-01-19 DIAGNOSIS — Z510 Encounter for antineoplastic radiation therapy: Secondary | ICD-10-CM | POA: Diagnosis present

## 2021-01-19 DIAGNOSIS — C7801 Secondary malignant neoplasm of right lung: Secondary | ICD-10-CM | POA: Insufficient documentation

## 2021-01-19 DIAGNOSIS — Z87891 Personal history of nicotine dependence: Secondary | ICD-10-CM | POA: Diagnosis not present

## 2021-01-19 NOTE — Progress Notes (Signed)
Oncology Nurse Navigator Documentation   Met with Theresa Fernandez and her husband after final RT to offer support and to celebrate end of radiation treatment.   Provided/reviewed Epic calendar of upcoming appts. Explained my role as navigator will continue for several more months, encouraged them to call me with needs/concerns.    Harlow Asa RN, BSN, OCN Head & Neck Oncology Nurse [place] at [facility] Phone # [phone]  Fax # [phone]

## 2021-02-13 ENCOUNTER — Inpatient Hospital Stay: Payer: Medicare Other

## 2021-02-13 NOTE — Progress Notes (Signed)
Nutrition Follow-up:    Patient with left parotid adenoid cystic carcinoma with pulmonary mets.  Patient completed radiation on 7/1 at [facility].    Spoke with patient via phone for nutrition follow-up.  Patient reports pain and swelling has gone down after radiation.  Reports that she has a good appetite and tolerating all consistency of foods.  Patient continues with dry mouth but drinking fluids to help. Patient reports that husband helps prepares meals for her.  Anthropometrics:   260 lb on 7/14 (at [facility]) Stable weight    INTERVENTION:  Patient denies questions or concerns about nutrition at this time.  Patient has contact information and will call RD if needed in the future     NEXT VISIT: no follow-up RD available if needed  Peter Keyworth B. Zenia Resides, [place], [place] Registered Dietitian [phone] (mobile)

## 2021-02-28 ENCOUNTER — Other Ambulatory Visit: Payer: Self-pay

## 2021-02-28 ENCOUNTER — Ambulatory Visit
Admission: RE | Admit: 2021-02-28 | Discharge: 2021-02-28 | Disposition: A | Discharge status: Home / Self Care | Payer: Medicare Other | Source: Ambulatory Visit | Attending: Radiation Oncology | Admitting: Radiation Oncology

## 2021-02-28 VITALS — BP 123/64 | HR 92 | Temp 97.0°F | Resp 18 | Ht 65.0 in | Wt 262.2 lb

## 2021-02-28 DIAGNOSIS — Z79899 Other long term (current) drug therapy: Secondary | ICD-10-CM | POA: Diagnosis not present

## 2021-02-28 DIAGNOSIS — Z7984 Long term (current) use of oral hypoglycemic drugs: Secondary | ICD-10-CM | POA: Diagnosis not present

## 2021-02-28 DIAGNOSIS — Z923 Personal history of irradiation: Secondary | ICD-10-CM | POA: Insufficient documentation

## 2021-02-28 DIAGNOSIS — C07 Malignant neoplasm of parotid gland: Secondary | ICD-10-CM | POA: Diagnosis present

## 2021-02-28 NOTE — Progress Notes (Signed)
Theresa Fernandez presents today for follow-up after completing radiation to her left parotid on 01/19/2021  Pain issues, if any: Reports marked improvement. States "first bite syndrome" has almost completely resolved, and mass behind her left ear has shrank to about a pea-size Weight changes, if any:  Wt Readings from Last 3 Encounters:  02/28/21 262 lb 4 oz (119 kg)  08/01/20 251 lb 1.7 oz (113.9 kg)  07/17/20 268 lb 9.6 oz (121.8 kg)   Swallowing issues, if any: Patient denies--reports she can eat/drink a wide variety  Smoking or chewing tobacco? None Respiratory: Denies any changes or concerns. Does report discomfort/SOB when she bends over, but states it resolves on its own. Continues to wear supplemental oxygen Last ENT visit was on: Not since diagnosis Other notable issues, if any: Continues to deal with dry mouth, but denies thick saliva/phlegm. Saw Theresa Fernandez on 02/01/2021 and is scheduled for CT w/ contrast of face/sinuses + CAP on 03/15/2021. She will then F/U with Theresa Fernandez on 03/22/2021 to review scan and discuss [facility] results   Vitals:   02/28/21 1128  BP: 123/64  Pulse: 92  Resp: 18  Temp: (!) 97 F (36.1 C)  SpO2: 97%

## 2021-03-01 ENCOUNTER — Encounter: Payer: Self-pay | Admitting: Radiation Oncology

## 2021-03-01 NOTE — Progress Notes (Signed)
Radiation Oncology         [phone]) [phone] ________________________________  Name: Theresa Fernandez MRN: [id number]  Date: 02/28/2021  DOB: [birth year]  Follow-Up Visit Note  CC: Center, [facility]  Theresa Fernandez, *  Diagnosis and Prior Radiotherapy:       ICD-10-CM   1. 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  Theresa Fernandez presents today for follow-up after completing radiation to her left parotid on 01/19/2021 (palliative, 30Gy/42f)  Pain issues, if any: Reports marked improvement. States "first bite syndrome" has almost completely resolved, and mass behind her left ear has shrank to about a pea-size Weight changes, if any:  Wt Readings from Last 3 Encounters:  02/28/21 262 lb 4 oz (119 kg)  08/01/20 251 lb 1.7 oz (113.9 kg)  07/17/20 268 lb 9.6 oz (121.8 kg)   Swallowing issues, if any: Patient denies--reports she can eat/drink a wide variety  Smoking or chewing tobacco? None Respiratory: Denies any changes or concerns. Does report discomfort/SOB when she bends over, but states it resolves on its own. Continues to wear supplemental oxygen Last ENT visit was on: Not since diagnosis Other notable issues, if any: Continues to deal with dry mouth, but denies thick saliva/phlegm. Saw Dr. MDewitt HoesPorosnicu on 02/01/2021 and is scheduled for CT w/ contrast of face/sinuses + CAP on 03/15/2021. She will then F/U with Dr. PMelven Sartoriuson 03/22/2021 to review scan and discuss Guardant results   Vitals:   02/28/21 1128  BP: 123/64  Pulse: 92  Resp: 18  Temp: (!) 97 F (36.1 C)  SpO2: 97%                       ALLERGIES:  is allergic to ceftriaxone, topamax [topiramate], capsaicin, and voltaren [diclofenac sodium].  Meds: Current Outpatient Medications  Medication Sig Dispense Refill   albuterol (ACCUNEB) 0.63 MG/3ML  nebulizer solution Take 3 mLs (0.63 mg total) by nebulization every 4 (four) hours as needed for wheezing or shortness of breath. 75 mL 11   albuterol (VENTOLIN HFA) 108 (90 Base) MCG/ACT inhaler Inhale 2 puffs into the lungs every 6 (six) hours as needed for wheezing or shortness of breath (wheezing). 18 g 6   ALPRAZolam (XANAX) 0.5 MG tablet Take 0.5 mg by mouth at bedtime.      amphetamine-dextroamphetamine (ADDERALL) 20 MG tablet Take 1 tablet (20 mg total) by mouth 3 (three) times daily. (Patient not taking: Reported on 07/29/2020) 90 tablet 0   busPIRone (BUSPAR) 15 MG tablet Take 30 mg by mouth at bedtime.      carisoprodol (SOMA) 250 MG tablet Take 250 mg by mouth See admin instructions. Take one tablet (250 mg) by mouth twice daily - 7pm and midnight     fluticasone-salmeterol (ADVAIR HFA) 115-21 MCG/ACT inhaler Inhale 1 puff into the lungs daily as needed.     furosemide (LASIX) 40 MG tablet Take 1 tablet (40 mg total) by mouth 2 (two) times daily. (Patient not taking: Reported on 07/29/2020) 90 tablet 3   HYDROcodone-acetaminophen (NORCO) 10-325 MG tablet Take 1 tablet by mouth every 6 (six) hours as needed (pain).      hydrOXYzine (ATARAX/VISTARIL) 25 MG tablet Take 25 mg by mouth at bedtime.      lamoTRIgine (LAMICTAL) 25 MG tablet Take 25 mg by mouth at bedtime.  Lancets (FREESTYLE) lancets 1 each daily.     levothyroxine (SYNTHROID) 25 MCG tablet Take 25 mcg by mouth daily.     lisinopril (PRINIVIL,ZESTRIL) 2.5 MG tablet Take 2.5 mg by mouth daily. For kidney function     lovastatin (MEVACOR) 40 MG tablet Take 80 mg by mouth daily.     Magnesium 500 MG TABS Take 500-1,000 mg by mouth See admin instructions. Take 1 tablet (500 mg) by mouth every morning and 2 tablets (1000 mg) at night     metolazone (ZAROXOLYN) 2.5 MG tablet Take 1 tablet (2.5 mg total) by mouth every other day. As needed for weight gain of 2 lbs 45 tablet 3   ondansetron (ZOFRAN) 8 MG tablet Take 1 tablet (8 mg  total) by mouth every 8 (eight) hours as needed for nausea or vomiting. 20 tablet 0   pantoprazole (PROTONIX) 40 MG tablet Take 1 tablet (40 mg total) by mouth 2 (two) times daily. 30 tablet 1   pioglitazone (ACTOS) 15 MG tablet Take 15 mg by mouth daily.     prazosin (MINIPRESS) 5 MG capsule Take 5 mg by mouth at bedtime.     pregabalin (LYRICA) 150 MG capsule Take 1 capsule (150 mg total) by mouth at bedtime. (Patient taking differently: Take 100 mg by mouth 2 (two) times daily.) 30 capsule 3   pregabalin (LYRICA) 150 MG capsule Take 1 capsule by mouth daily.     sertraline (ZOLOFT) 100 MG tablet Take 200 mg by mouth at bedtime.      Tiotropium Bromide-Olodaterol (STIOLTO RESPIMAT) 2.5-2.5 MCG/ACT AERS Inhale 2 puffs into the lungs daily. 4 g 0   Tiotropium Bromide-Olodaterol 2.5-2.5 MCG/ACT AERS Inhale 2 puffs into the lungs daily. 12 g 3   Vitamin D, Ergocalciferol, (DRISDOL) 1.25 MG (50000 UNIT) CAPS capsule Take 50,000 Units by mouth every Tuesday.     No current facility-administered medications for this encounter.    Physical Findings: The patient is in no acute distress. Patient is alert and oriented. Wt Readings from Last 3 Encounters:  02/28/21 262 lb 4 oz (119 kg)  08/01/20 251 lb 1.7 oz (113.9 kg)  07/17/20 268 lb 9.6 oz (121.8 kg)    height is '5\' 5"'$  (1.651 m) and weight is 262 lb 4 oz (119 kg). Her temporal temperature is 97 F (36.1 C) (abnormal). Her blood pressure is 123/64 and her pulse is 92. Her respiration is 18 and oxygen saturation is 97%. .  General: Alert and oriented, in no acute distress HEENT: Continues to have limited ability to blink, left eye, though this is improved. Neck: Neck is notable for marked reduction in left parotid swelling. Resolution of tenderness to palpation. No adenopathy appreciated. Mild swelling, left face. Skin: Skin in treatment fields shows satisfactory healing - no residual effects notes Psychiatric: Judgment and insight are intact.  Affect is appropriate.   Lab Findings: Lab Results  Component Value Date   WBC 10.0 07/31/2020   HGB 11.7 (L) 08/01/2020   HCT 38.0 08/01/2020   MCV 95.1 07/31/2020   PLT 272 07/31/2020    Lab Results  Component Value Date   TSH 2.537 07/30/2020    Radiographic Findings: No results found.  Impression/Plan:    1) Head and Neck Cancer Status: excellent palliative response to RT , left parotid  2) Follow-up with medical oncology, Dr. Melven Sartorius, for management of metastatic disease. I will see her back PRN.  The patient was encouraged to call with any issues or questions  in the future. She is pleased with this plan.  On date of service, in total, I spent 15 minutes on this encounter. Patient was seen in person. _____________________________________   Eppie Gibson, MD

## 2021-03-19 NOTE — Progress Notes (Signed)
                                                                                                                                                             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   Plan: The patient will follow-up with radiation oncology in 79mo. -----------------------------------  SEppie Gibson MD

## 2021-05-17 ENCOUNTER — Ambulatory Visit (INDEPENDENT_AMBULATORY_CARE_PROVIDER_SITE_OTHER): Payer: Medicare Other | Admitting: Pulmonary Disease

## 2021-05-17 ENCOUNTER — Other Ambulatory Visit: Payer: Self-pay

## 2021-05-17 ENCOUNTER — Encounter: Payer: Self-pay | Admitting: Pulmonary Disease

## 2021-05-17 VITALS — BP 122/60 | HR 92 | Temp 97.9°F | Ht 66.0 in | Wt 262.8 lb

## 2021-05-17 DIAGNOSIS — C07 Malignant neoplasm of parotid gland: Secondary | ICD-10-CM

## 2021-05-17 DIAGNOSIS — J9611 Chronic respiratory failure with hypoxia: Secondary | ICD-10-CM

## 2021-05-17 DIAGNOSIS — J438 Other emphysema: Secondary | ICD-10-CM | POA: Diagnosis not present

## 2021-05-17 DIAGNOSIS — J439 Emphysema, unspecified: Secondary | ICD-10-CM | POA: Diagnosis not present

## 2021-05-17 DIAGNOSIS — J449 Chronic obstructive pulmonary disease, unspecified: Secondary | ICD-10-CM | POA: Diagnosis not present

## 2021-05-17 DIAGNOSIS — C7801 Secondary malignant neoplasm of right lung: Secondary | ICD-10-CM

## 2021-05-17 DIAGNOSIS — J984 Other disorders of lung: Secondary | ICD-10-CM

## 2021-05-17 DIAGNOSIS — C7802 Secondary malignant neoplasm of left lung: Secondary | ICD-10-CM

## 2021-05-17 DIAGNOSIS — Z6841 Body Mass Index (BMI) 40.0 and over, adult: Secondary | ICD-10-CM

## 2021-05-17 MED ORDER — BREZTRI AEROSPHERE 160-9-4.8 MCG/ACT IN AERO: 2.0000 | INHALATION_SPRAY | Freq: Two times a day (BID) | RESPIRATORY_TRACT | 0 refills | Status: DC

## 2021-05-17 NOTE — Patient Instructions (Addendum)
Thank you for visiting Dr. Valeta Harms at [facility]. Today we recommend the following:  Breztri samples, spacer and new prescription Continue albuterol as needed   Return in about 6 months (around 11/15/2021) for with APP or Dr. Valeta Harms.    Please do your part to reduce the spread of COVID-19.

## 2021-05-17 NOTE — Progress Notes (Signed)
Synopsis: Referred in Dec 2019 for COPD exacerbation by [facility]  Subjective:   PATIENT ID: Theresa Fernandez GENDER: female DOB: 29-Sep-1955, MRN: [id number]  Chief Complaint  Patient presents with   Follow-up    Pt. Needs new prescription for oxygen    PMH DMII, HTN, COPD. She quit smoking on 05/31/2018. Smoked from age 65 y ears to 64, at her max was smoking 3 packs per day. She ultimately went to the ER and was admitted to the hospital on 06/06/2018 for AECOPD and decompensated heart failure. She was seen by a pulmonologist at [facility]. She was tried on several DPIs and each made her feel like she was going to vomit. She was treated with steroids and albuterol/ antibiotics while in the hospital. She was given diuretics and said she lost 20 lbs. She was given oxygen to use at home when she needed it.  She has not had any recent axial CT imaging of the chest.  She also has chronic pain due to thoracic compression fractures.  She is followed also at the [facility] for this.  She is taking chronic Percocet use.  She was diagnosed with obstructive sleep apnea greater than 10 years ago.  This was her last sleep study.  She does not remember where this was completed.  She was tried on several masks during that time.  She did not like the way that any of the masks felt.  She had very much trouble using a full facemask not having any dentures or full teeth.  She also had trouble with the nasal pillows rolling off her face.  During her hospitalization she was placed on BiPAP for some period of time.  She said she did very well while using BiPAP and would consider using it at home.  She still is exposed to significant secondhand smoke from her family members who live with her as well as her husband.    OV 08/24/2018: Patient seen today for follow-up.  She has multiple medical problems and recently seen in the emergency room back in December for decompensated heart failure.   Former smoker quit back in November 2019.  PFTs completed in January revealed impaired spirometry with an FVC 61%, FEV1 64% ratio of 80 and moderate DLCO reduction.  Today in the office she complains of short of breath and dyspnea when she exerts herself.  She has a prior history of obstructive sleep apnea not on CPAP.  Patient denies chest pain hemoptysis.  She does not have a cardiologist.  Is interested in seeing 1.  I do think she has multiple medical risk factors for coronary disease.  And a known diagnosis of heart failure.  Last echocardiogram was in November 2019 which revealed a normal ejection fraction and grade 1 diastolic dysfunction.  OV 07/17/2020: Needs requalifed for O2 today for POC.  Currently not on any maintenance inhalers.  She is using albuterol only.  Still feels short of breath with exertion.  She barely gets out of her house.  Also wants to stay somewhat socially isolated if possible.  She is Covid vaccinated.  Her and her husband will occasionally go out but seldom.  She also feels like this limits her activity level.  Because she is at home all the time.  She also understands that she needs to continue to try to lose weight.  OV 05/17/2021: This is a 65 year old female here today for follow-up.  Chronic hypoxemic respiratory failure COPD heart failure kidney  disease.  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  Relatively rare tumor that has also spread to her lungs.  CT imaging reviewed today she has multiple lesions within the lungs consistent with metastatic disease.  She is followed by radiation oncology where she has had treatments to the lesion directly.  Also talked about immune therapy with her medical oncologist.   Past Medical History:  Diagnosis Date   Allergy    Anxiety    Arthritis    CHF (congestive heart failure) ([place])    COPD (chronic obstructive pulmonary disease) ([place])    Depression    Diabetes mellitus  without complication ([place])    HTN (hypertension) 01/12/2013   pt denies     Family History  Problem Relation Age of Onset   Diabetes Mother    Alcohol abuse Father    Diabetes Father    Stroke Brother      Past Surgical History:  Procedure Laterality Date   BIOPSY  08/01/2020   Procedure: BIOPSY;  Surgeon: Milus Banister, MD;  Location: [facility] ENDOSCOPY;  Service: Endoscopy;;   ESOPHAGOGASTRODUODENOSCOPY (EGD) WITH PROPOFOL N/A 08/01/2020   Procedure: ESOPHAGOGASTRODUODENOSCOPY (EGD) WITH PROPOFOL;  Surgeon: Milus Banister, MD;  Location: [facility] ENDOSCOPY;  Service: Endoscopy;  Laterality: N/A;   MASTOIDECTOMY     TONSILLECTOMY     TUBAL LIGATION      Social History   Socioeconomic History   Marital status: Married    Spouse name: Not on file   Number of children: Not on file   Years of education: Not on file   Highest education level: Not on file  Occupational History   Not on file  Tobacco Use   Smoking status: Former    Packs/day: 3.00    Years: 50.00    Pack years: 150.00    Types: Cigarettes    Quit date: 05/30/2018    Years since quitting: 2.9   Smokeless tobacco: Never  Vaping Use   Vaping Use: Never used  Substance and Sexual Activity   Alcohol use: No   Drug use: No   Sexual activity: Not on file  Other Topics Concern   Not on file  Social History Narrative   Not on file   Social Determinants of Health   Financial Resource Strain: Not on file  Food Insecurity: Not on file  Transportation Needs: Not on file  Physical Activity: Not on file  Stress: Not on file  Social Connections: Not on file  Intimate Partner Violence: Not on file     Allergies  Allergen Reactions   Ceftriaxone Hives and Rash    After second dose on 06/07/2018,   Topamax [Topiramate] Other (See Comments)    Made headache worse   Capsaicin Rash   Voltaren [Diclofenac Sodium] Other (See Comments)    Caused migraine     Outpatient Medications Prior to Visit  Medication Sig  Dispense Refill   albuterol (ACCUNEB) 0.63 MG/3ML nebulizer solution Take 3 mLs (0.63 mg total) by nebulization every 4 (four) hours as needed for wheezing or shortness of breath. 75 mL 11   albuterol (VENTOLIN HFA) 108 (90 Base) MCG/ACT inhaler Inhale 2 puffs into the lungs every 6 (six) hours as needed for wheezing or shortness of breath (wheezing). 18 g 6   ALPRAZolam (XANAX) 0.5 MG tablet Take 0.5 mg by mouth at bedtime.      busPIRone (BUSPAR) 15 MG tablet Take 30 mg by  mouth at bedtime.      carisoprodol (SOMA) 250 MG tablet Take 250 mg by mouth See admin instructions. Take one tablet (250 mg) by mouth twice daily - 7pm and midnight     furosemide (LASIX) 40 MG tablet Take 1 tablet (40 mg total) by mouth 2 (two) times daily. 90 tablet 3   HYDROcodone-acetaminophen (NORCO) 10-325 MG tablet Take 1 tablet by mouth every 6 (six) hours as needed (pain).      hydrOXYzine (ATARAX/VISTARIL) 25 MG tablet Take 25 mg by mouth at bedtime.      lamoTRIgine (LAMICTAL) 25 MG tablet Take 25 mg by mouth at bedtime.      Lancets (FREESTYLE) lancets 1 each daily.     levothyroxine (SYNTHROID) 25 MCG tablet Take 25 mcg by mouth daily.     lovastatin (MEVACOR) 40 MG tablet Take 80 mg by mouth daily.     Magnesium 500 MG TABS Take 500-1,000 mg by mouth See admin instructions. Take 1 tablet (500 mg) by mouth every morning and 2 tablets (1000 mg) at night     ondansetron (ZOFRAN) 8 MG tablet Take 1 tablet (8 mg total) by mouth every 8 (eight) hours as needed for nausea or vomiting. 20 tablet 0   pantoprazole (PROTONIX) 40 MG tablet Take 1 tablet (40 mg total) by mouth 2 (two) times daily. 30 tablet 1   pioglitazone (ACTOS) 15 MG tablet Take 15 mg by mouth daily.     prazosin (MINIPRESS) 5 MG capsule Take 5 mg by mouth at bedtime.     pregabalin (LYRICA) 150 MG capsule Take 1 capsule (150 mg total) by mouth at bedtime. (Patient taking differently: Take 100 mg by mouth 2 (two) times daily.) 30 capsule 3   pregabalin  (LYRICA) 150 MG capsule Take 1 capsule by mouth daily.     sertraline (ZOLOFT) 100 MG tablet Take 200 mg by mouth at bedtime.      Tiotropium Bromide-Olodaterol (STIOLTO RESPIMAT) 2.5-2.5 MCG/ACT AERS Inhale 2 puffs into the lungs daily. 4 g 0   Tiotropium Bromide-Olodaterol 2.5-2.5 MCG/ACT AERS Inhale 2 puffs into the lungs daily. 12 g 3   Vitamin D, Ergocalciferol, (DRISDOL) 1.25 MG (50000 UNIT) CAPS capsule Take 50,000 Units by mouth every Tuesday.     amphetamine-dextroamphetamine (ADDERALL) 20 MG tablet Take 1 tablet (20 mg total) by mouth 3 (three) times daily. (Patient not taking: No sig reported) 90 tablet 0   lisinopril (PRINIVIL,ZESTRIL) 2.5 MG tablet Take 2.5 mg by mouth daily. For kidney function (Patient not taking: Reported on 05/17/2021)     metolazone (ZAROXOLYN) 2.5 MG tablet Take 1 tablet (2.5 mg total) by mouth every other day. As needed for weight gain of 2 lbs 45 tablet 3   fluticasone-salmeterol (ADVAIR HFA) 115-21 MCG/ACT inhaler Inhale 1 puff into the lungs daily as needed. (Patient not taking: Reported on 05/17/2021)     No facility-administered medications prior to visit.    Review of Systems  Constitutional:  Negative for chills, fever, malaise/fatigue and weight loss.  HENT:  Negative for hearing loss, sore throat and tinnitus.   Eyes:  Negative for blurred vision and double vision.  Respiratory:  Positive for cough and shortness of breath. Negative for hemoptysis, sputum production, wheezing and stridor.   Cardiovascular:  Negative for chest pain, palpitations, orthopnea, leg swelling and PND.  Gastrointestinal:  Negative for abdominal pain, constipation, diarrhea, heartburn, nausea and vomiting.  Genitourinary:  Negative for dysuria, hematuria and urgency.  Musculoskeletal:  Negative  for joint pain and myalgias.  Skin:  Negative for itching and rash.  Neurological:  Negative for dizziness, tingling, weakness and headaches.  Endo/Heme/Allergies:  Negative for  environmental allergies. Does not bruise/bleed easily.  Psychiatric/Behavioral:  Negative for depression. The patient is not nervous/anxious and does not have insomnia.   All other systems reviewed and are negative.   Objective:  Physical Exam Vitals reviewed.  Constitutional:      General: She is not in acute distress.    Appearance: She is well-developed. She is obese.  HENT:     Head: Normocephalic and atraumatic.  Eyes:     General: No scleral icterus.    Conjunctiva/sclera: Conjunctivae normal.     Pupils: Pupils are equal, round, and reactive to light.  Neck:     Vascular: No JVD.     Trachea: No tracheal deviation.  Cardiovascular:     Rate and Rhythm: Normal rate and regular rhythm.     Heart sounds: Normal heart sounds. No murmur heard. Pulmonary:     Effort: Pulmonary effort is normal. No tachypnea, accessory muscle usage or respiratory distress.     Breath sounds: No stridor. No wheezing, rhonchi or rales.  Abdominal:     General: Bowel sounds are normal. There is no distension.     Palpations: Abdomen is soft.     Tenderness: There is no abdominal tenderness.  Musculoskeletal:        General: No tenderness.     Cervical back: Neck supple.     Right lower leg: Edema present.     Left lower leg: Edema present.  Lymphadenopathy:     Cervical: No cervical adenopathy.  Skin:    General: Skin is warm and dry.     Capillary Refill: Capillary refill takes less than 2 seconds.     Findings: No rash.  Neurological:     Mental Status: She is alert and oriented to person, place, and time.  Psychiatric:        Behavior: Behavior normal.     Vitals:   05/17/21 1359 05/17/21 1400  BP: 122/60 122/60  Pulse: 92 92  Temp:  97.9 F (36.6 C)  TempSrc:  Oral  SpO2: 95% 95%  Weight:  262 lb 12.8 oz (119.2 kg)  Height:  5\' 6"  (1.676 m)   95% on RA BMI Readings from Last 3 Encounters:  05/17/21 42.42 kg/m  02/28/21 43.64 kg/m  08/01/20 41.79 kg/m   Wt Readings  from Last 3 Encounters:  05/17/21 262 lb 12.8 oz (119.2 kg)  02/28/21 262 lb 4 oz (119 kg)  08/01/20 251 lb 1.7 oz (113.9 kg)     CBC    Component Value Date/Time   WBC 10.0 07/31/2020 0201   RBC 3.45 (L) 07/31/2020 0201   HGB 11.7 (L) 08/01/2020 0702   HCT 38.0 08/01/2020 0702   PLT 272 07/31/2020 0201   MCV 95.1 07/31/2020 0201   MCH 30.4 07/31/2020 0201   MCHC 32.0 07/31/2020 0201   RDW 15.3 07/31/2020 0201   LYMPHSABS 1.7 07/31/2020 0201   MONOABS 0.9 07/31/2020 0201   EOSABS 0.1 07/31/2020 0201   BASOSABS 0.1 07/31/2020 0201    Chest Imaging: 06/06/2018 chest x-ray: Bilateral vascular congestion 06/12/2018 chest x-ray: Bilateral interstitial prominence somewhat improved when compared to previous x-ray  08/04/2018 HRCT: Patchy centrilobular groundglass nodules and longstanding history of smoking likely imaging consistent with respiratory bronchiolitis with ILD.  Not consistent with UIP. The patient's images have been independently reviewed  by me.    12/25/2020 CT Chest: Bilateral multiple pulmonary nodules  The patient's images have been independently reviewed by me.    Pulmonary Functions Testing Results: PFT Results Latest Ref Rng & Units 07/20/2018  FVC-Pre L 1.78  FVC-Predicted Pre % 58  FVC-Post L 1.87  FVC-Predicted Post % 61  Pre FEV1/FVC % % 77  Post FEV1/FCV % % 80  FEV1-Pre L 1.38  FEV1-Predicted Pre % 58  FEV1-Post L 1.50  DLCO uncorrected ml/min/mmHg 12.34  DLCO UNC% % 55  DLCO corrected ml/min/mmHg 13.13  DLCO COR %Predicted % 59  DLVA Predicted % 92  TLC L 3.85  TLC % Predicted % 79  RV % Predicted % 96    FeNO: None   Pathology: None   Echocardiogram: 06/08/2018 - Preserved EF, Grade 1 DD.   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  Currently on inhaler regimen and continuous oxygen.  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  Plan: Continue follow-up with radiation oncology She has follow-up with medical oncology soon to discuss possibility of immune therapy. Stop Stiolto Samples of Breztri, spacer and new prescription today Continue albuterol as needed. Follow-up with [place] in 6 months or as needed.   Current Outpatient Medications:    albuterol (ACCUNEB) 0.63 MG/3ML nebulizer solution, Take 3 mLs (0.63 mg total) by nebulization every 4 (four) hours as needed for wheezing or shortness of breath., Disp: 75 mL, Rfl: 11   albuterol (VENTOLIN HFA) 108 (90 Base) MCG/ACT inhaler, Inhale 2 puffs into the lungs every 6 (six) hours as needed for wheezing or shortness of breath (wheezing)., Disp: 18 g, Rfl: 6   ALPRAZolam (XANAX) 0.5 MG tablet, Take 0.5 mg by mouth at bedtime. , Disp: , Rfl:    Budeson-Glycopyrrol-Formoterol (BREZTRI AEROSPHERE) 160-9-4.8 MCG/ACT AERO, Inhale 2 puffs into the lungs in the morning and at bedtime., Disp: 10.7 g, Rfl: 0   Budeson-Glycopyrrol-Formoterol (BREZTRI AEROSPHERE) 160-9-4.8 MCG/ACT AERO, Inhale 2 puffs into the lungs in the morning and at bedtime., Disp: 10.7 g, Rfl: 0   busPIRone (BUSPAR) 15 MG tablet, Take 30 mg by mouth at bedtime. , Disp: , Rfl:    carisoprodol (SOMA) 250 MG tablet, Take 250 mg by mouth See admin instructions. Take one tablet (250 mg) by mouth twice daily - 7pm and midnight, Disp: , Rfl:    furosemide (LASIX) 40 MG tablet, Take 1 tablet (40 mg total) by mouth 2  (two) times daily., Disp: 90 tablet, Rfl: 3   HYDROcodone-acetaminophen (NORCO) 10-325 MG tablet, Take 1 tablet by mouth every 6 (six) hours as needed (pain). , Disp: , Rfl:    hydrOXYzine (ATARAX/VISTARIL) 25 MG tablet, Take 25 mg by mouth at bedtime. , Disp: , Rfl:    lamoTRIgine (LAMICTAL) 25 MG tablet, Take 25 mg by mouth at bedtime. , Disp: , Rfl:    Lancets (FREESTYLE) lancets, 1 each daily., Disp: , Rfl:    levothyroxine (SYNTHROID)  25 MCG tablet, Take 25 mcg by mouth daily., Disp: , Rfl:    lovastatin (MEVACOR) 40 MG tablet, Take 80 mg by mouth daily., Disp: , Rfl:    Magnesium 500 MG TABS, Take 500-1,000 mg by mouth See admin instructions. Take 1 tablet (500 mg) by mouth every morning and 2 tablets (1000 mg) at night, Disp: , Rfl:    ondansetron (ZOFRAN) 8 MG tablet, Take 1 tablet (8 mg total) by mouth every 8 (eight) hours as needed for nausea or vomiting., Disp: 20 tablet, Rfl: 0   pantoprazole (PROTONIX) 40 MG tablet, Take 1 tablet (40 mg total) by mouth 2 (two) times daily., Disp: 30 tablet, Rfl: 1   pioglitazone (ACTOS) 15 MG tablet, Take 15 mg by mouth daily., Disp: , Rfl:    prazosin (MINIPRESS) 5 MG capsule, Take 5 mg by mouth at bedtime., Disp: , Rfl:    pregabalin (LYRICA) 150 MG capsule, Take 1 capsule (150 mg total) by mouth at bedtime. (Patient taking differently: Take 100 mg by mouth 2 (two) times daily.), Disp: 30 capsule, Rfl: 3   pregabalin (LYRICA) 150 MG capsule, Take 1 capsule by mouth daily., Disp: , Rfl:    sertraline (ZOLOFT) 100 MG tablet, Take 200 mg by mouth at bedtime. , Disp: , Rfl:    Tiotropium Bromide-Olodaterol (STIOLTO RESPIMAT) 2.5-2.5 MCG/ACT AERS, Inhale 2 puffs into the lungs daily., Disp: 4 g, Rfl: 0   Tiotropium Bromide-Olodaterol 2.5-2.5 MCG/ACT AERS, Inhale 2 puffs into the lungs daily., Disp: 12 g, Rfl: 3   Vitamin D, Ergocalciferol, (DRISDOL) 1.25 MG (50000 UNIT) CAPS capsule, Take 50,000 Units by mouth every Tuesday., Disp: , Rfl:     amphetamine-dextroamphetamine (ADDERALL) 20 MG tablet, Take 1 tablet (20 mg total) by mouth 3 (three) times daily. (Patient not taking: No sig reported), Disp: 90 tablet, Rfl: 0   lisinopril (PRINIVIL,ZESTRIL) 2.5 MG tablet, Take 2.5 mg by mouth daily. For kidney function (Patient not taking: Reported on 05/17/2021), Disp: , Rfl:    metolazone (ZAROXOLYN) 2.5 MG tablet, Take 1 tablet (2.5 mg total) by mouth every other day. As needed for weight gain of 2 lbs, Disp: 45 tablet, Rfl: 3   Garner Nash, DO [facility] 05/19/2021 9:44 AM

## 2021-07-09 ENCOUNTER — Telehealth: Payer: Self-pay | Admitting: Pulmonary Disease

## 2021-07-09 NOTE — Telephone Encounter (Signed)
I called the pharmacy and per the last note with Dr. Valeta Harms he was switching the patient to [facility] and she was not on Stiolto. I had tried to reach out to patient to verify but I was not able to leave a message. I let the pharmacy know that the provider had changed to [facility]. Nothing further needed.

## 2021-08-06 ENCOUNTER — Inpatient Hospital Stay (HOSPITAL_COMMUNITY): Payer: Medicare Other

## 2021-08-06 ENCOUNTER — Emergency Department (HOSPITAL_COMMUNITY): Payer: Medicare Other

## 2021-08-06 ENCOUNTER — Encounter (HOSPITAL_COMMUNITY): Payer: Self-pay | Admitting: Emergency Medicine

## 2021-08-06 ENCOUNTER — Inpatient Hospital Stay (HOSPITAL_COMMUNITY)
Admission: EM | Admit: 2021-08-06 | Discharge: 2021-08-10 | DRG: 543 | Disposition: A | Discharge status: Home / Self Care | Payer: Medicare Other | Attending: Internal Medicine | Admitting: Internal Medicine

## 2021-08-06 DIAGNOSIS — E119 Type 2 diabetes mellitus without complications: Secondary | ICD-10-CM

## 2021-08-06 DIAGNOSIS — C7801 Secondary malignant neoplasm of right lung: Secondary | ICD-10-CM | POA: Diagnosis present

## 2021-08-06 DIAGNOSIS — Z87891 Personal history of nicotine dependence: Secondary | ICD-10-CM

## 2021-08-06 DIAGNOSIS — I130 Hypertensive heart and chronic kidney disease with heart failure and stage 1 through stage 4 chronic kidney disease, or unspecified chronic kidney disease: Secondary | ICD-10-CM | POA: Diagnosis not present

## 2021-08-06 DIAGNOSIS — Z66 Do not resuscitate: Secondary | ICD-10-CM | POA: Diagnosis not present

## 2021-08-06 DIAGNOSIS — M4850XA Collapsed vertebra, not elsewhere classified, site unspecified, initial encounter for fracture: Secondary | ICD-10-CM | POA: Diagnosis present

## 2021-08-06 DIAGNOSIS — I5032 Chronic diastolic (congestive) heart failure: Secondary | ICD-10-CM | POA: Diagnosis present

## 2021-08-06 DIAGNOSIS — Z833 Family history of diabetes mellitus: Secondary | ICD-10-CM | POA: Diagnosis not present

## 2021-08-06 DIAGNOSIS — Z823 Family history of stroke: Secondary | ICD-10-CM | POA: Diagnosis not present

## 2021-08-06 DIAGNOSIS — Z888 Allergy status to other drugs, medicaments and biological substances status: Secondary | ICD-10-CM

## 2021-08-06 DIAGNOSIS — G8929 Other chronic pain: Secondary | ICD-10-CM | POA: Diagnosis present

## 2021-08-06 DIAGNOSIS — C07 Malignant neoplasm of parotid gland: Secondary | ICD-10-CM | POA: Diagnosis present

## 2021-08-06 DIAGNOSIS — Z9981 Dependence on supplemental oxygen: Secondary | ICD-10-CM

## 2021-08-06 DIAGNOSIS — E669 Obesity, unspecified: Secondary | ICD-10-CM | POA: Diagnosis present

## 2021-08-06 DIAGNOSIS — F419 Anxiety disorder, unspecified: Secondary | ICD-10-CM | POA: Diagnosis not present

## 2021-08-06 DIAGNOSIS — K838 Other specified diseases of biliary tract: Secondary | ICD-10-CM

## 2021-08-06 DIAGNOSIS — C7949 Secondary malignant neoplasm of other parts of nervous system: Secondary | ICD-10-CM | POA: Diagnosis not present

## 2021-08-06 DIAGNOSIS — E1122 Type 2 diabetes mellitus with diabetic chronic kidney disease: Secondary | ICD-10-CM | POA: Diagnosis present

## 2021-08-06 DIAGNOSIS — Z20822 Contact with and (suspected) exposure to covid-19: Secondary | ICD-10-CM | POA: Diagnosis present

## 2021-08-06 DIAGNOSIS — K257 Chronic gastric ulcer without hemorrhage or perforation: Secondary | ICD-10-CM

## 2021-08-06 DIAGNOSIS — M542 Cervicalgia: Secondary | ICD-10-CM | POA: Diagnosis present

## 2021-08-06 DIAGNOSIS — M546 Pain in thoracic spine: Secondary | ICD-10-CM

## 2021-08-06 DIAGNOSIS — J438 Other emphysema: Secondary | ICD-10-CM | POA: Diagnosis not present

## 2021-08-06 DIAGNOSIS — N1831 Chronic kidney disease, stage 3a: Secondary | ICD-10-CM | POA: Diagnosis not present

## 2021-08-06 DIAGNOSIS — C7802 Secondary malignant neoplasm of left lung: Secondary | ICD-10-CM | POA: Diagnosis not present

## 2021-08-06 DIAGNOSIS — M8458XA Pathological fracture in neoplastic disease, other specified site, initial encounter for fracture: Secondary | ICD-10-CM | POA: Diagnosis not present

## 2021-08-06 DIAGNOSIS — G4733 Obstructive sleep apnea (adult) (pediatric): Secondary | ICD-10-CM | POA: Diagnosis present

## 2021-08-06 DIAGNOSIS — G893 Neoplasm related pain (acute) (chronic): Secondary | ICD-10-CM | POA: Diagnosis not present

## 2021-08-06 DIAGNOSIS — F32A Depression, unspecified: Secondary | ICD-10-CM | POA: Diagnosis present

## 2021-08-06 DIAGNOSIS — K259 Gastric ulcer, unspecified as acute or chronic, without hemorrhage or perforation: Secondary | ICD-10-CM | POA: Diagnosis not present

## 2021-08-06 DIAGNOSIS — N183 Chronic kidney disease, stage 3 unspecified: Secondary | ICD-10-CM | POA: Diagnosis present

## 2021-08-06 DIAGNOSIS — C7951 Secondary malignant neoplasm of bone: Secondary | ICD-10-CM | POA: Diagnosis present

## 2021-08-06 DIAGNOSIS — J9611 Chronic respiratory failure with hypoxia: Secondary | ICD-10-CM | POA: Diagnosis not present

## 2021-08-06 DIAGNOSIS — Z6841 Body Mass Index (BMI) 40.0 and over, adult: Secondary | ICD-10-CM

## 2021-08-06 LAB — COMPREHENSIVE METABOLIC PANEL
ALT: 15 U/L (ref 0–44)
AST: 19 U/L (ref 15–41)
Albumin: 3.2 g/dL — ABNORMAL LOW (ref 3.5–5.0)
Alkaline Phosphatase: 90 U/L (ref 38–126)
Anion gap: 11 (ref 5–15)
BUN: 15 mg/dL (ref 8–23)
CO2: 34 mmol/L — ABNORMAL HIGH (ref 22–32)
Calcium: 9.1 mg/dL (ref 8.9–10.3)
Chloride: 95 mmol/L — ABNORMAL LOW (ref 98–111)
Creatinine, Ser: 1.07 mg/dL — ABNORMAL HIGH (ref 0.44–1.00)
GFR, Estimated: 57 mL/min — ABNORMAL LOW (ref >60)
Glucose, Bld: 99 mg/dL (ref 70–99)
Potassium: 4 mmol/L (ref 3.5–5.1)
Sodium: 140 mmol/L (ref 135–145)
Total Bilirubin: 0.5 mg/dL (ref 0.3–1.2)
Total Protein: 7 g/dL (ref 6.5–8.1)

## 2021-08-06 LAB — TROPONIN I (HIGH SENSITIVITY): Troponin I (High Sensitivity): 8 ng/L (ref <18)

## 2021-08-06 LAB — CBC WITH DIFFERENTIAL/PLATELET
Abs Immature Granulocytes: 0.05 10*3/uL (ref 0.00–0.07)
Basophils Absolute: 0 10*3/uL (ref 0.0–0.1)
Basophils Relative: 0 %
Eosinophils Absolute: 0.3 10*3/uL (ref 0.0–0.5)
Eosinophils Relative: 3 %
HCT: 38.3 % (ref 36.0–46.0)
Hemoglobin: 11.6 g/dL — ABNORMAL LOW (ref 12.0–15.0)
Immature Granulocytes: 1 %
Lymphocytes Relative: 17 %
Lymphs Abs: 1.6 10*3/uL (ref 0.7–4.0)
MCH: 30.4 pg (ref 26.0–34.0)
MCHC: 30.3 g/dL (ref 30.0–36.0)
MCV: 100.3 fL — ABNORMAL HIGH (ref 80.0–100.0)
Monocytes Absolute: 0.7 10*3/uL (ref 0.1–1.0)
Monocytes Relative: 8 %
Neutro Abs: 6.8 10*3/uL (ref 1.7–7.7)
Neutrophils Relative %: 71 %
Platelets: 269 10*3/uL (ref 150–400)
RBC: 3.82 MIL/uL — ABNORMAL LOW (ref 3.87–5.11)
RDW: 14.5 % (ref 11.5–15.5)
WBC: 9.5 10*3/uL (ref 4.0–10.5)
nRBC: 0 % (ref 0.0–0.2)

## 2021-08-06 LAB — URINALYSIS, ROUTINE W REFLEX MICROSCOPIC
Bilirubin Urine: NEGATIVE
Glucose, UA: NEGATIVE mg/dL
Hgb urine dipstick: NEGATIVE
Ketones, ur: NEGATIVE mg/dL
Nitrite: NEGATIVE
Protein, ur: NEGATIVE mg/dL
Specific Gravity, Urine: 1.015 (ref 1.005–1.030)
pH: 8 (ref 5.0–8.0)

## 2021-08-06 LAB — URINALYSIS, MICROSCOPIC (REFLEX): WBC, UA: >50 WBC/hpf (ref 0–5)

## 2021-08-06 LAB — RESP PANEL BY RT-PCR (FLU A&B, COVID) ARPGX2
Influenza A by PCR: NEGATIVE
Influenza B by PCR: NEGATIVE
SARS Coronavirus 2 by RT PCR: NEGATIVE

## 2021-08-06 LAB — BRAIN NATRIURETIC PEPTIDE: B Natriuretic Peptide: 20.6 pg/mL (ref 0.0–100.0)

## 2021-08-06 LAB — LACTIC ACID, PLASMA: Lactic Acid, Venous: 0.8 mmol/L (ref 0.5–1.9)

## 2021-08-06 LAB — TSH: TSH: 1.306 u[IU]/mL (ref 0.350–4.500)

## 2021-08-06 IMAGING — CR DG FEMUR 2+V*L*
4 series · 4 of 4 positions shown · non-contrast
Comparison: None.

CLINICAL DATA: Left lower extremity muscle spasms.

EXAM:
LEFT FEMUR 2 VIEWS

[femur ap (1 of 2)]
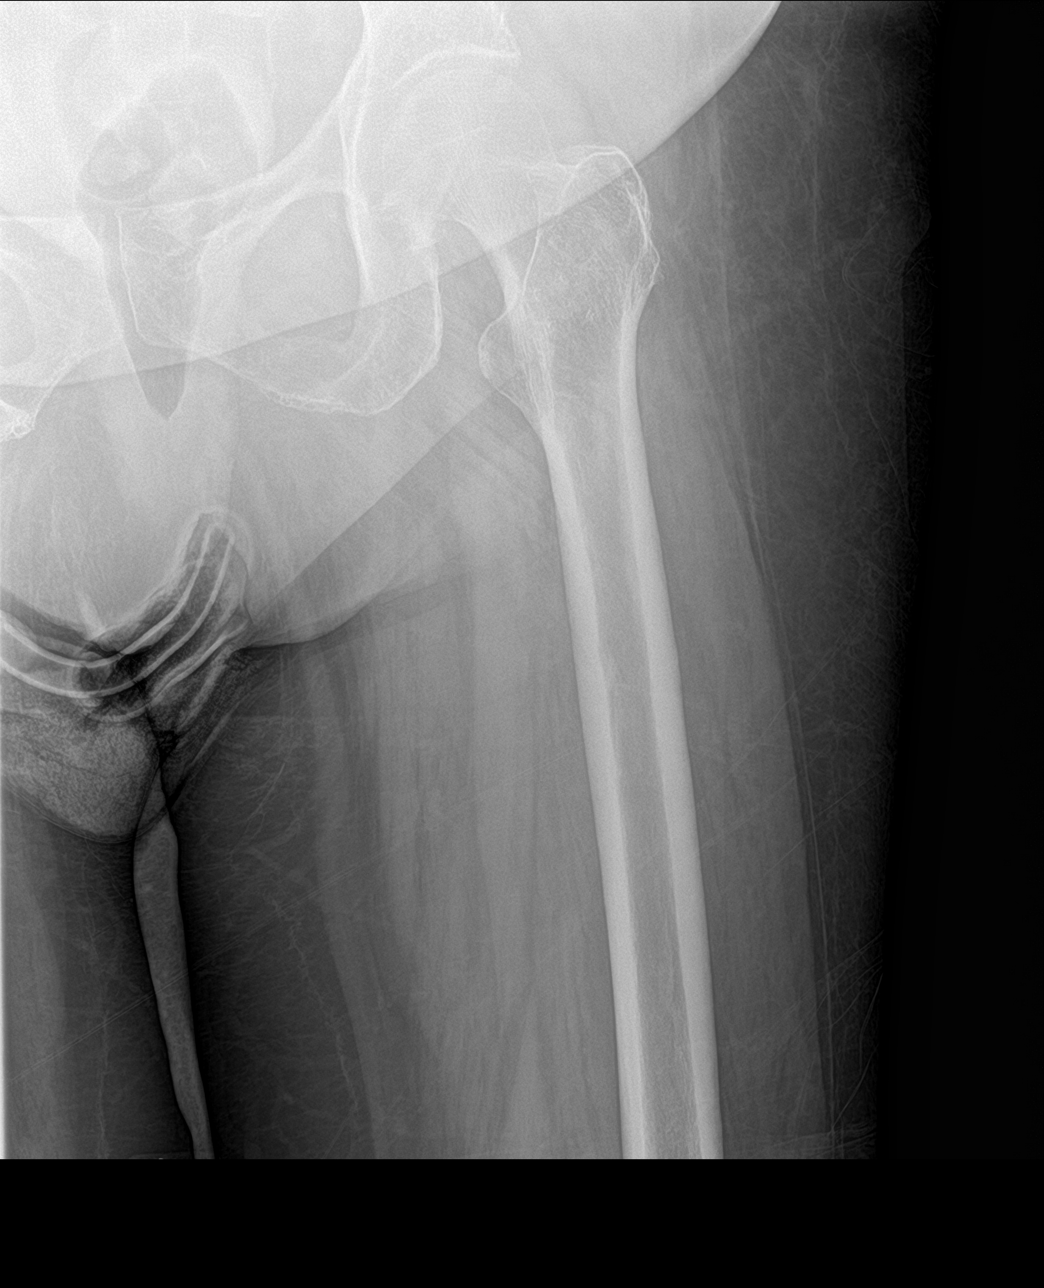

[femur ap (2 of 2)]
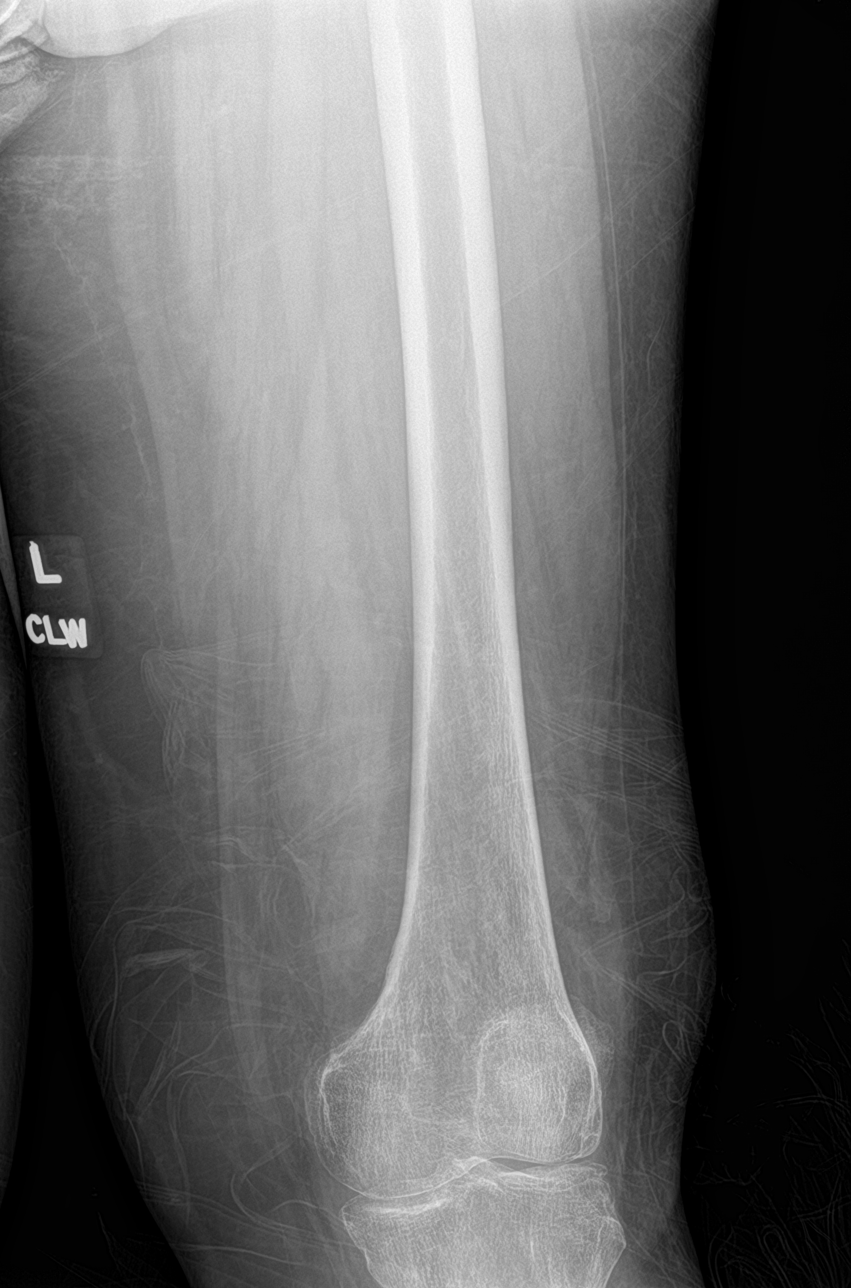

[femur lat (1 of 2)]
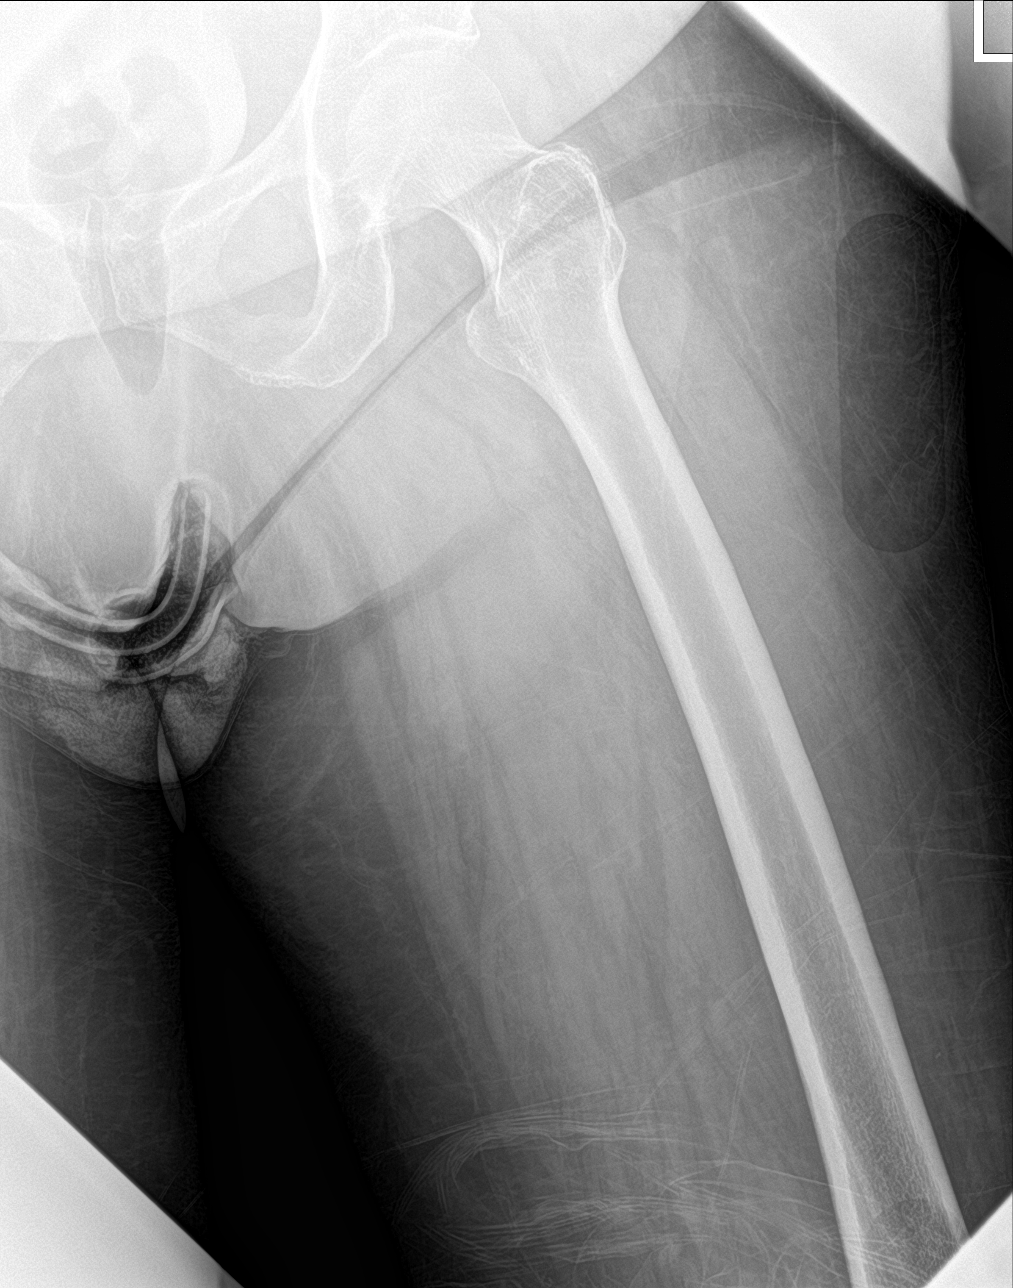

[femur lat (2 of 2)]
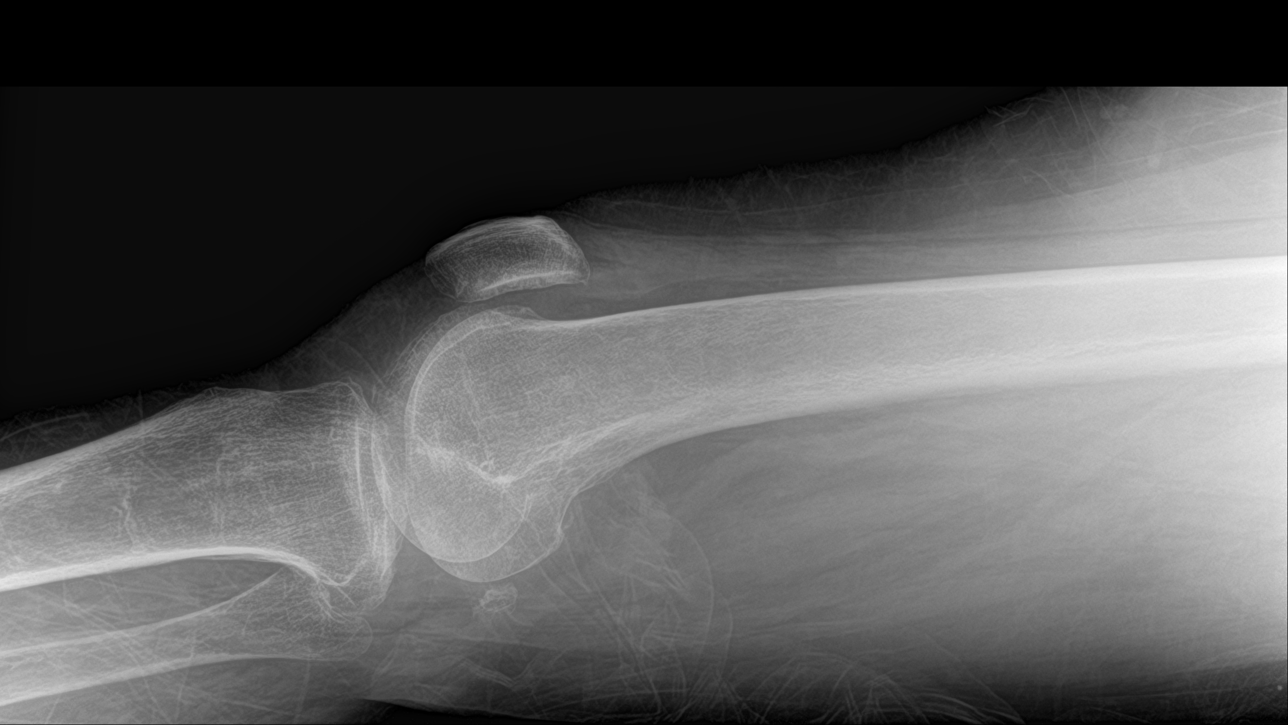

[4 of 4 positions shown; findings below may reference images not displayed]

FINDINGS: There is no evidence of fracture or other focal bone lesions. A
small joint effusion is seen within the left knee.
IMPRESSION: 1. No acute osseous abnormality.
2. Small left knee joint effusion.

## 2021-08-06 IMAGING — MR MR CERVICAL SPINE WO/W CM
4 of 8 series · 15 of 48 positions shown · IV contrast (gadavist)
Comparison: Prior CT from earlier the same day as well as previous
MRI from [DATE].

CLINICAL DATA: Follow-up examination for metastatic disease.

EXAM:
MRI CERVICAL AND THORACIC SPINE WITHOUT AND WITH CONTRAST
TECHNIQUE: Multiplanar and multiecho pulse sequences of the cervical spine, to
include the craniocervical junction and cervicothoracic junction,
and the thoracic spine, were obtained without and with intravenous
contrast.
CONTRAST:  10mL GADAVIST GADOBUTROL 1 MMOL/ML IV SOLN

[Series 3: T2 · sagittal · 3.0mm · 0.31mm/px · 3 of 18 slices shown (1 of 2)]
[im 1/18]
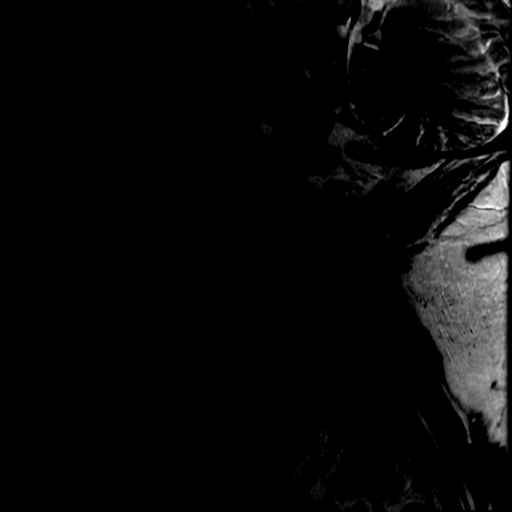
[im 9/18]
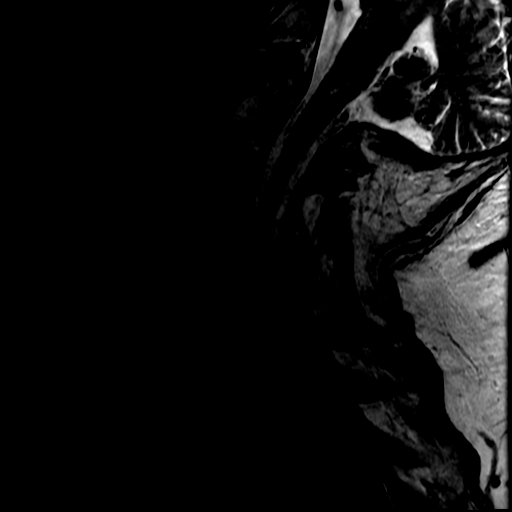
[im 18/18]
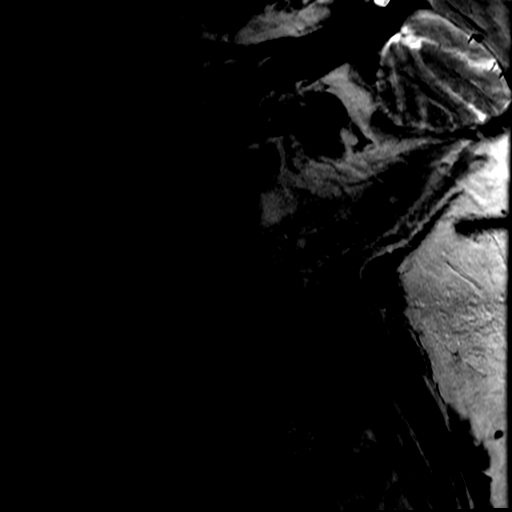

[Series 9: T2 · axial · 3.0mm · 0.35mm/px · z∈[-178,-56]mm · 6 of 42 slices shown (2 of 2)]
[im 1/42]
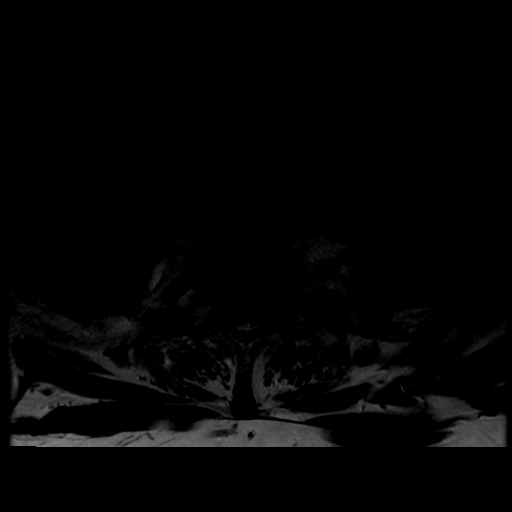
[im 9/42]
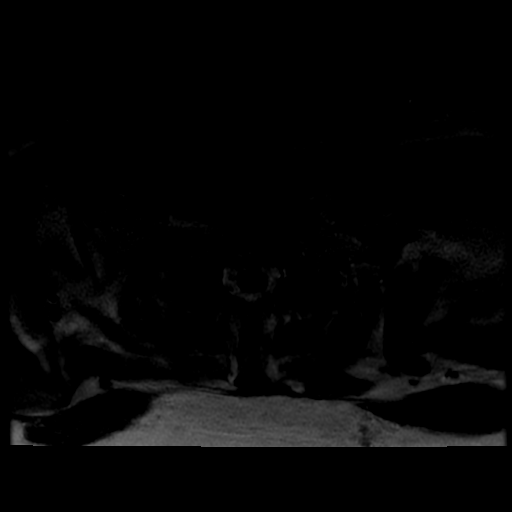
[im 17/42]
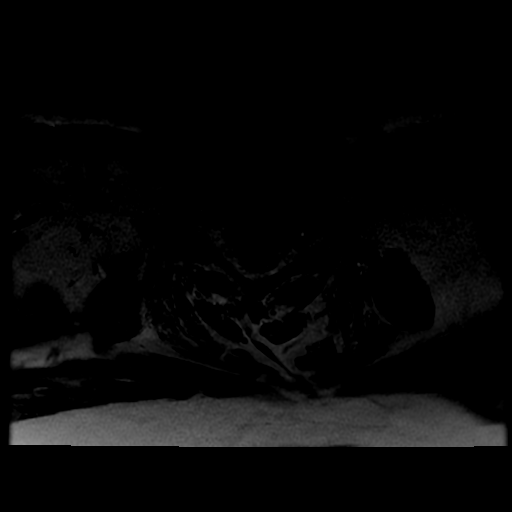
[im 25/42]
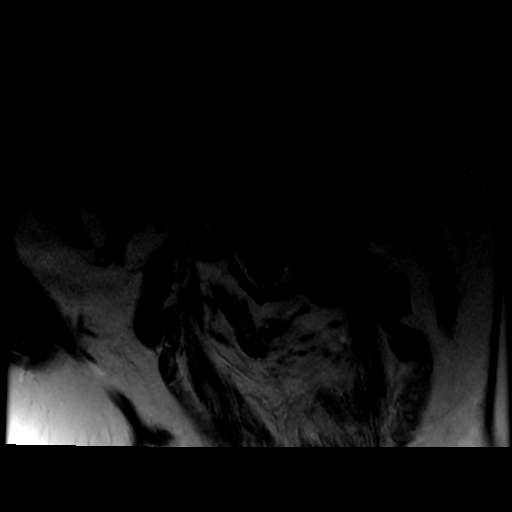
[im 33/42]
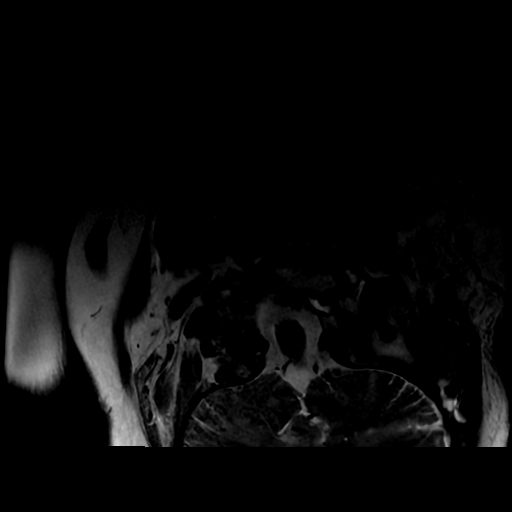
[im 42/42]
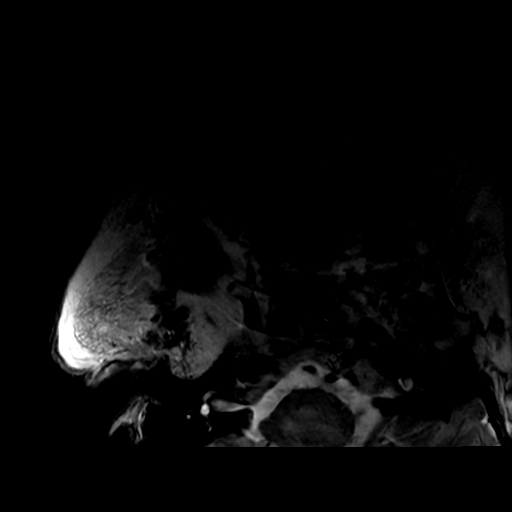

[Series 10: T1 · axial · non-contrast · 3.0mm · 0.35mm/px · z∈[-154,-56]mm · 3 of 42 slices shown]
[im 9/42]
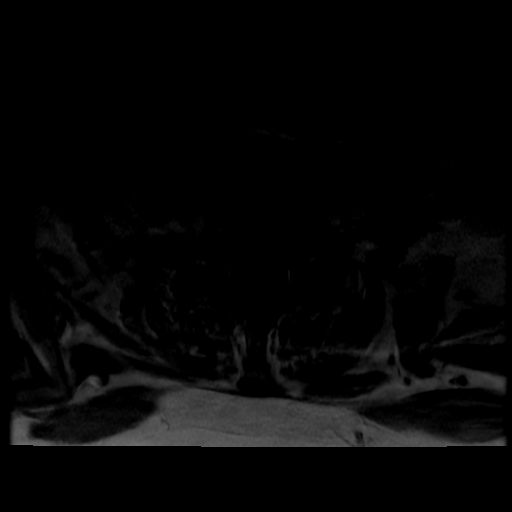
[im 25/42]
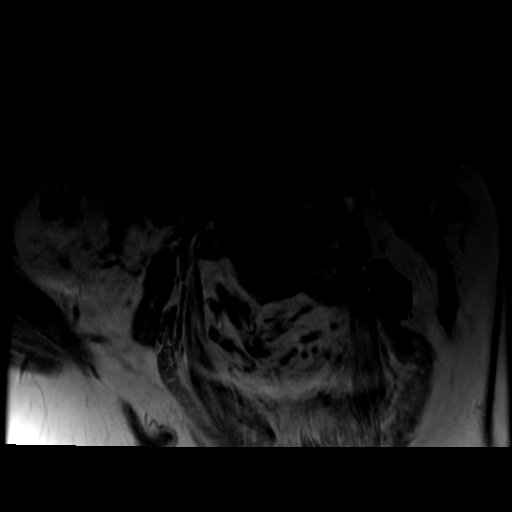
[im 42/42]
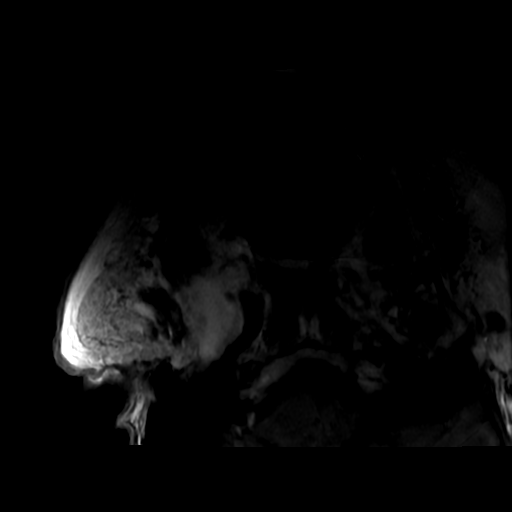

[Series 23: T1 fat-sat post-contrast · sagittal · 3.0mm · 0.31mm/px · 3 of 18 slices shown]
[im 1/18]
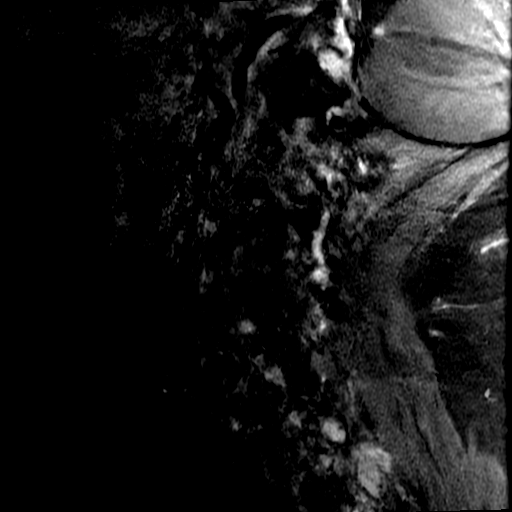
[im 9/18]
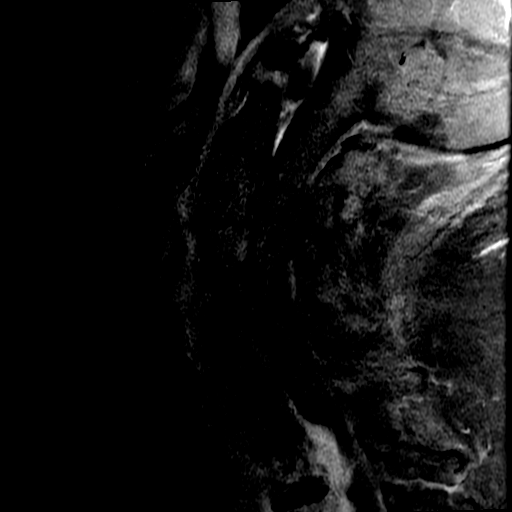
[im 18/18]
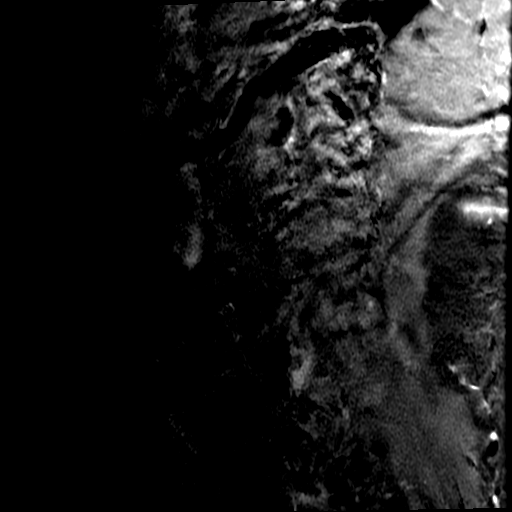

[15 of 48 positions shown; findings below may reference images not displayed]

FINDINGS: MRI CERVICAL SPINE FINDINGS

Alignment: Examination moderately to severely degraded by motion
artifact.

Vertebral bodies normally aligned with preservation of the normal
cervical lordosis. No listhesis.

Vertebrae: Metastatic disease involving the upper thoracic spine
noted, discussed on corresponding thoracic portion of this exam.
Otherwise, signal intensity within the visualized bone marrow of the
cervical spine is within normal limits. No metastatic involvement
within the cervical spine itself. No other discrete or worrisome
osseous lesions. No other abnormal marrow edema or enhancement.
Vertebral body height maintained without acute or chronic fracture.

Cord: Signal intensity within the cervical spinal cord is grossly
within normal limits. No appreciable cord signal abnormality or
abnormal enhancement on this motion degraded exam.

Posterior Fossa, vertebral arteries, paraspinal tissues: Probable
chronic microvascular ischemic disease noted within the partially
visualized pons and brainstem. Visualized brain and posterior fossa
otherwise unremarkable. Craniocervical junction within normal
limits. Paraspinous soft tissues within normal limits. Normal flow
voids seen within the vertebral arteries bilaterally.

Disc levels:

Mild for age degenerative disc desiccation with minimal disc bulging
throughout the cervical spine. No significant spinal stenosis or
frank cord impingement. Foramina appear grossly patent.

MRI THORACIC SPINE FINDINGS

Alignment: Examination moderately to severely degraded by motion
artifact.

Vertebral bodies normally aligned with preservation of the normal
thoracic kyphosis. Trace anterolisthesis of T9 on T10, with trace
retrolisthesis of T10 on T11 noted.

Vertebrae: Abnormal T1 hypointense, stir hyperintense enhancing
lesions seen involving the T1 and T2 vertebral bodies, consistent
with metastatic disease. Associated mild height loss at these levels
consistent with associated pathologic fractures, most pronounced at
the T2 level where height loss measures up to approximately 30-40%.
Epidural extension with tumor seen involving the right greater than
left ventral epidural space at this level (series 21, image 14).
Mild flattening and mass effect on the thoracic cord posteriorly
with up to moderate spinal stenosis. No visible cord signal changes
on this motion degraded exam. Lateral extension to involve the T1-2
neural foramina bilaterally which are largely obliterated.
Additional extension into the left greater than right T2-3 neural
foramina with mild to moderate bilateral foraminal stenosis.
Additional extension to involve the posterior elements including the
bilateral pedicles and posterior facets noted as well.

Additional metastatic lesion involving the T6 vertebral body seen as
well. Associated pathologic compression fracture with up to 50%
height loss. Epidural extension of tumor with small volume tumor
seen extending into the ventral epidural space (series 21, image
12). No more than mild spinal stenosis at this level.

No other metastatic disease seen within the thoracic spine. Late
subacute to chronic compression fracture involving the superior
endplate of T12 with mild 30% height loss and 3 mm bony retropulsion
noted. Multiple additional chronic compression fractures noted
elsewhere throughout the thoracic spine, involving the T3, T5, T7,
T8, and T10 vertebral bodies. Additional chronic compression
fractures involving the upper lumbar spine at L1 through L3 noted.
Sequelae of prior vertebral augmentation present at T7, T8, and T10.

Cord: Signal intensity within the thoracic spinal cord is within
normal limits. No appreciable cord signal changes on this motion
degraded exam.

Paraspinal and other soft tissues: Moderate right with small left
layering pleural effusions. Innumerable pulmonary nodules seen
throughout the visualized lungs, consistent with metastatic disease.

Disc levels:

Multilevel degenerative disc bulging seen throughout the thoracic
spine extending from C5-6 through the visualized upper lumbar spine.
Superimposed multilevel facet arthropathy. No other high-grade
spinal stenosis. Neural foramina otherwise appear to be largely
patent.
IMPRESSION: 1. Motion degraded exam.
2. Osseous metastatic disease involving the T1 and T2 vertebral
bodies with associated pathologic fractures. Epidural extension with
tumor extending into the ventral epidural space at these levels,
resulting in up to moderate spinal stenosis and cord flattening. No
visible cord signal changes on this motion degraded exam.
3. Additional metastatic lesion involving the T6 vertebral body with
associated pathologic fracture and up to 50% height loss. Small
volume ventral epidural extension of tumor with no more than mild
spinal stenosis at this level.
4. No evidence for metastatic disease within the cervical spine.
5. Innumerable pulmonary nodules throughout the visualized lungs,
consistent with metastatic disease.
6. Late subacute to chronic compression fracture of the superior
endplate of T12 with mild 30% height loss and 3 mm bony
retropulsion. Multiple additional chronic compression fractures as
above.
7. Small to moderate bilateral pleural effusions, greater on the
right.
8. Underlying multilevel degenerative spondylosis and facet
degeneration throughout the cervicothoracic spine. No other
high-grade spinal stenosis or impingement.

## 2021-08-06 IMAGING — US US ABDOMEN LIMITED
1 series · 14 of 25 positions shown · non-contrast
Comparison: CT [DATE]

CLINICAL DATA: Dilated CBD

EXAM:
ULTRASOUND ABDOMEN LIMITED RIGHT UPPER QUADRANT

[Series 1: us abdomen limited ruq (liver/gb) · 46 acquisitions, 14 frames shown]
[im 1/46]
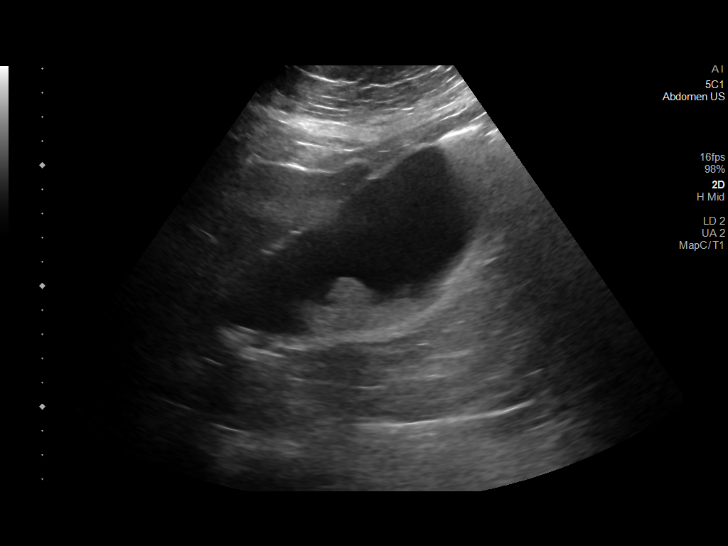
[im 4/46]
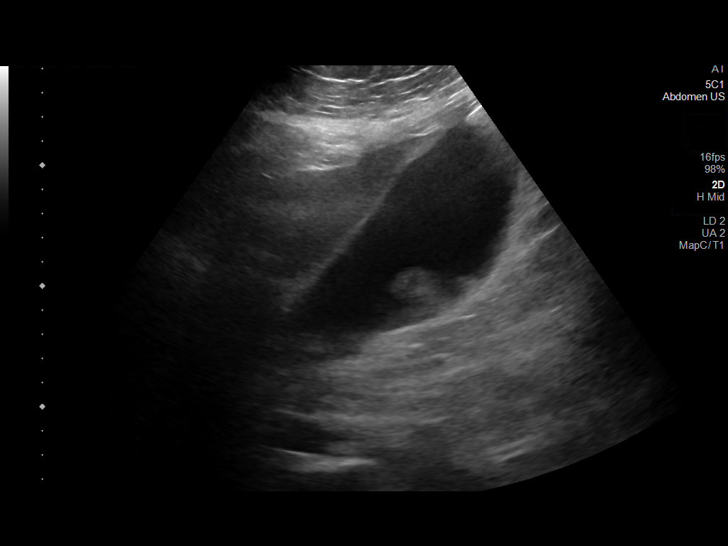
[im 8/46]
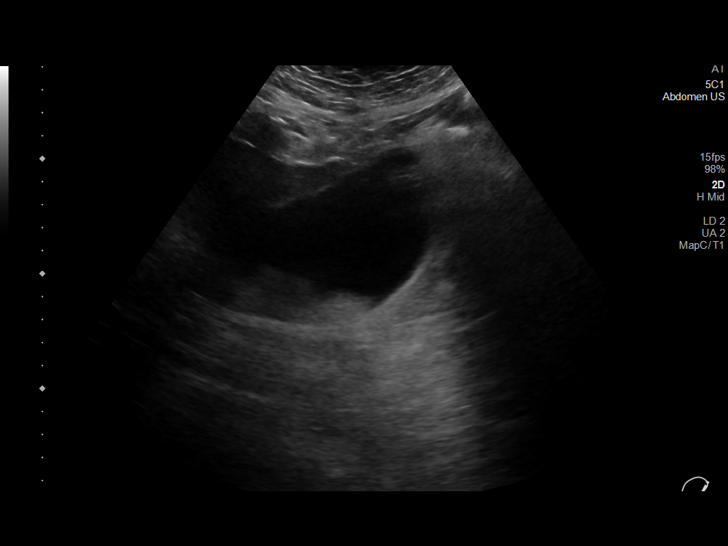
[im 12/46]
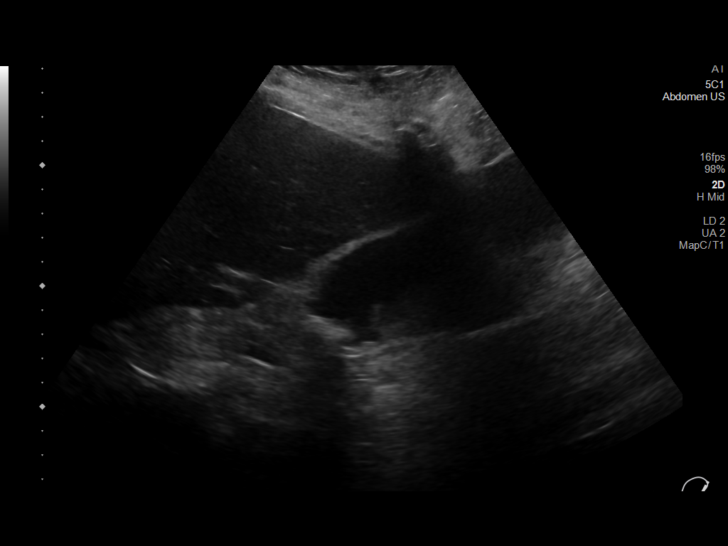
[im 16/46]
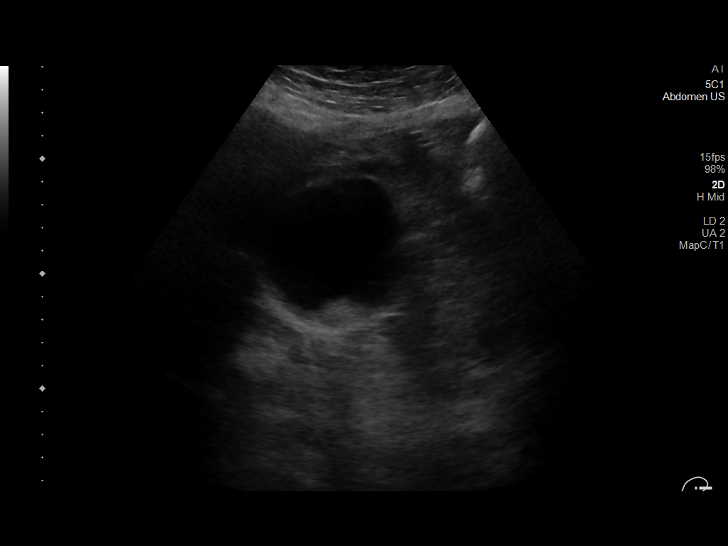
[im 17/46]
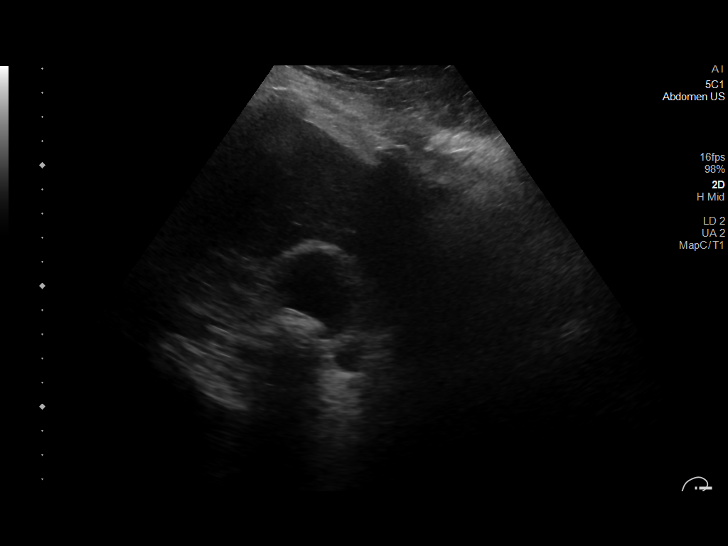
[im 21/46]
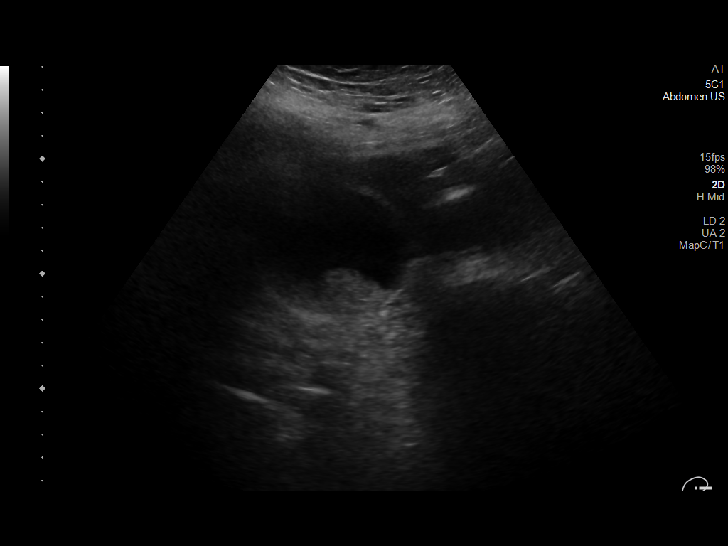
[im 25/46]
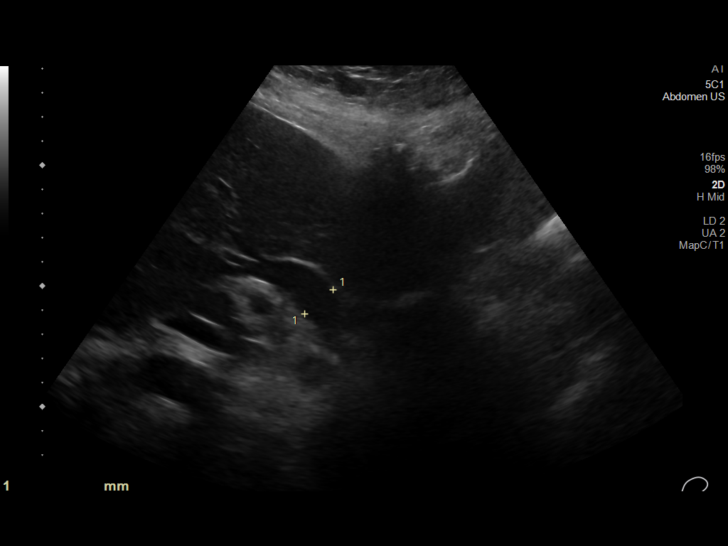
[im 29/46]
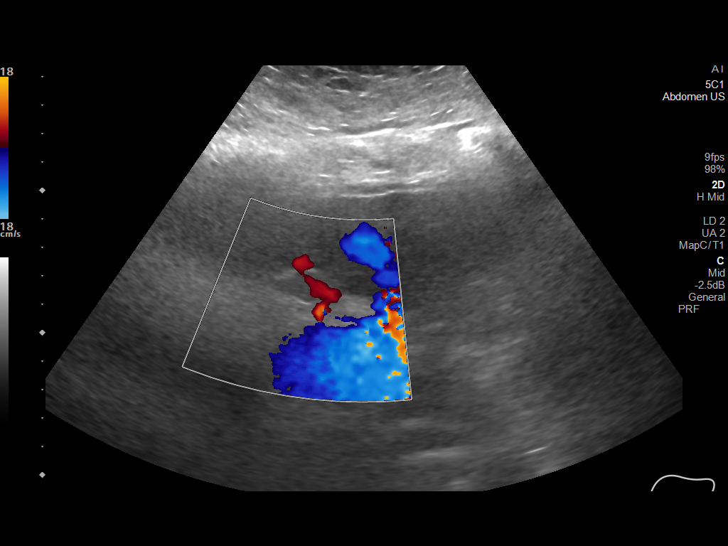
[im 31/46]
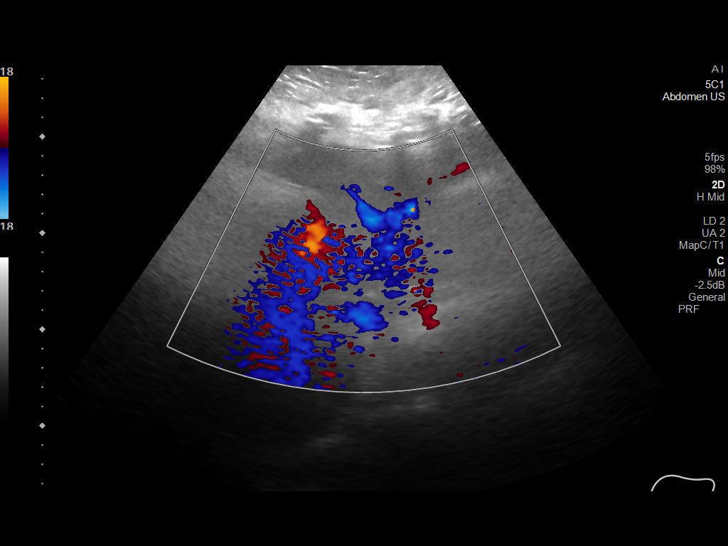
[im 34/46]
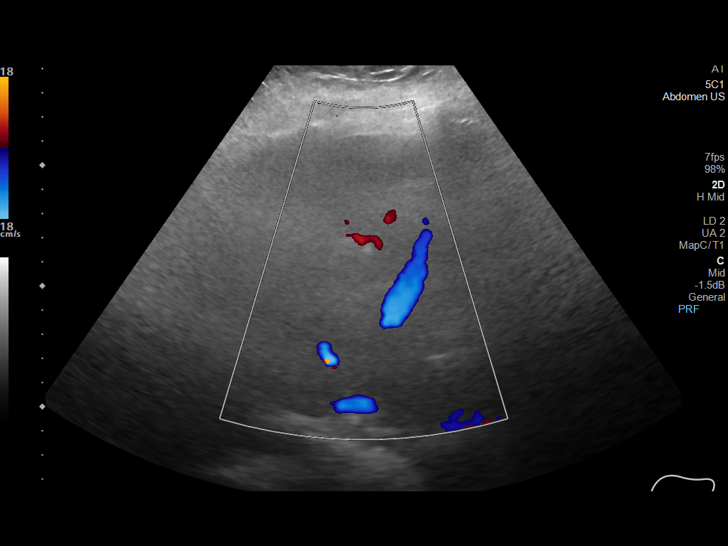
[im 38/46]
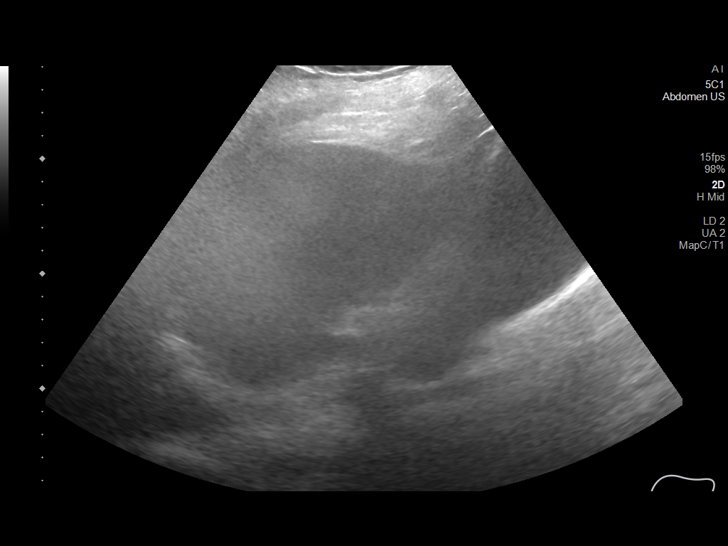
[im 42/46]
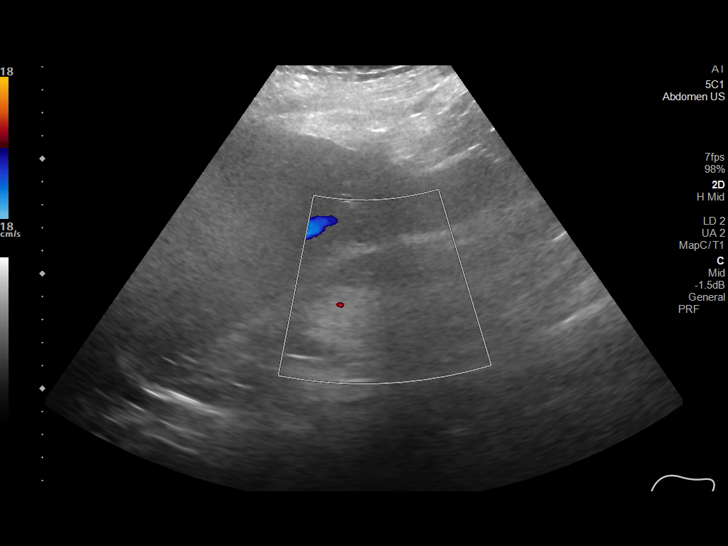
[im 46/46]
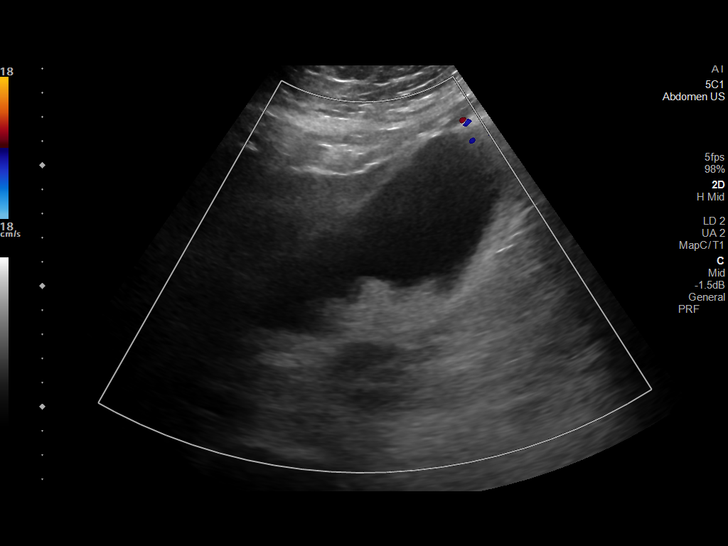

[14 of 25 positions shown; findings below may reference images not displayed]

FINDINGS: Gallbladder:

Sludge and stones within the gallbladder. Normal wall thickness. No
sonographic Murphy indicated but pain medication given per history
form.

Common bile duct:

Diameter: 15.5 mm

Liver:

Liver slightly echogenic. No focal hepatic abnormality. Portal vein
is patent on color Doppler imaging with normal direction of blood
flow towards the liver.

Other: None.
IMPRESSION: 1. Gallstones without sonographic evidence for acute cholecystitis
2. Dilated common bile duct up to 15.5 mm. Consider correlation with
MRCP to assess for ductal obstruction.
3. Slightly echogenic liver suggesting steatosis

## 2021-08-06 IMAGING — CT CT CERVICAL SPINE W/O CM
3 of 4 series · 13 of 33 positions shown, 16 images · non-contrast
Comparison: CT neck [DATE]

CLINICAL DATA: Acute neck pain



[Series 5: c_spine 2.0 orthogonals · axial · 0.30mm/px · z∈[+846,+973]mm · 5 of 94 slices shown, 7 images]
[im 16/94  soft-tissue]
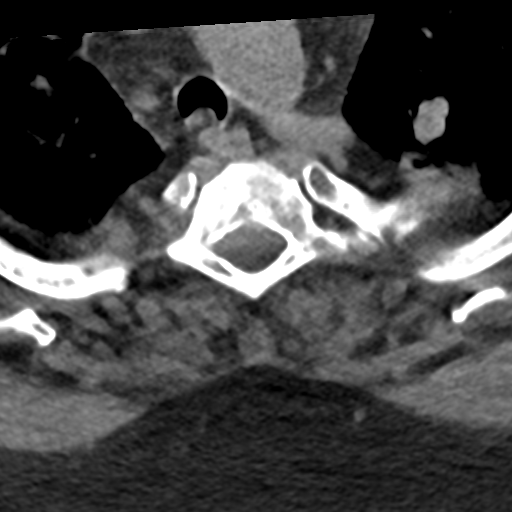
[im 16/94  bone]
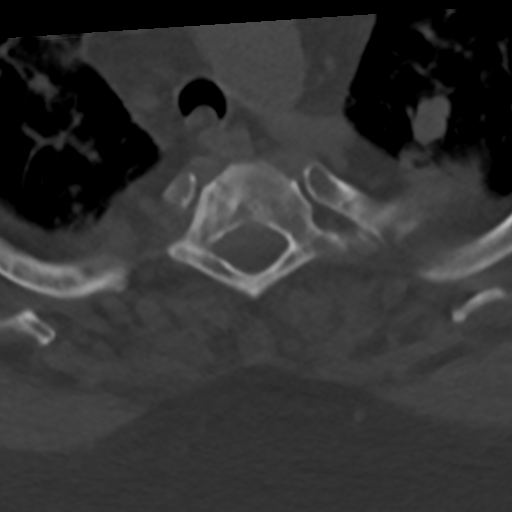
[im 32/94  bone]
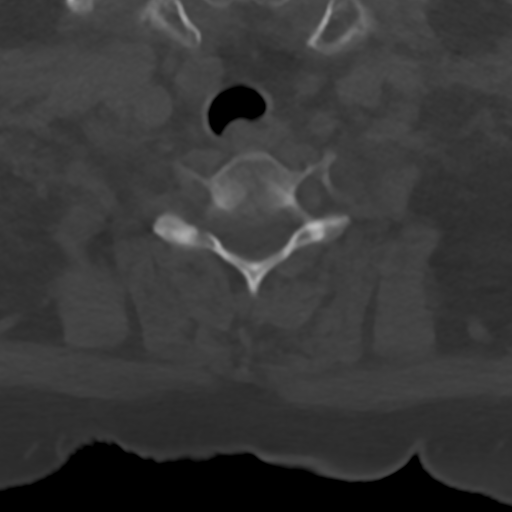
[im 47/94  bone]
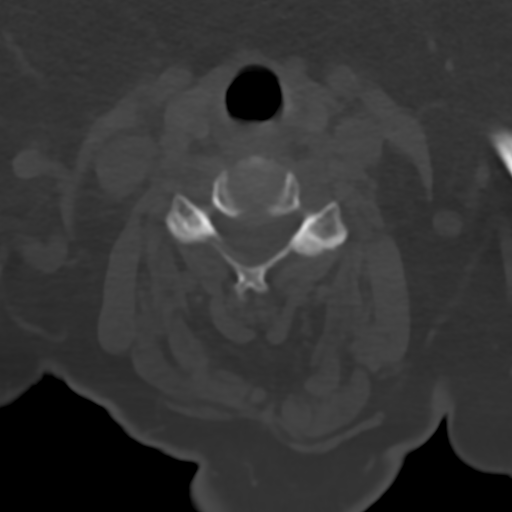
[im 63/94  bone]
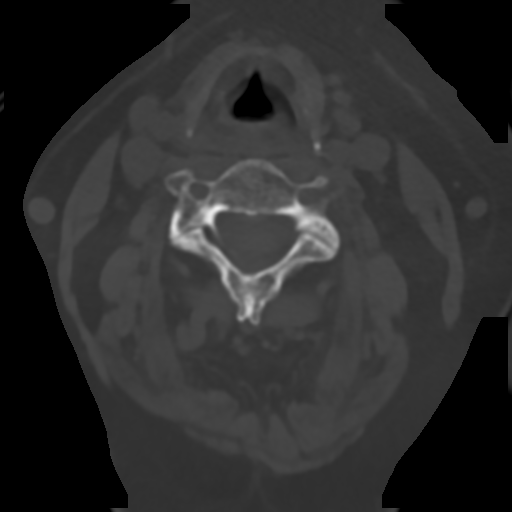
[im 78/94  soft-tissue]
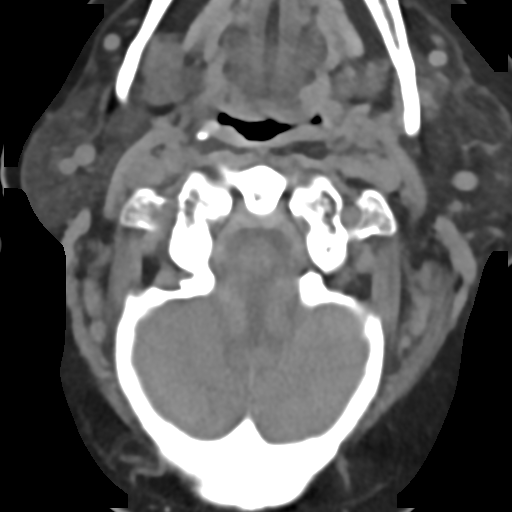
[im 78/94  bone]
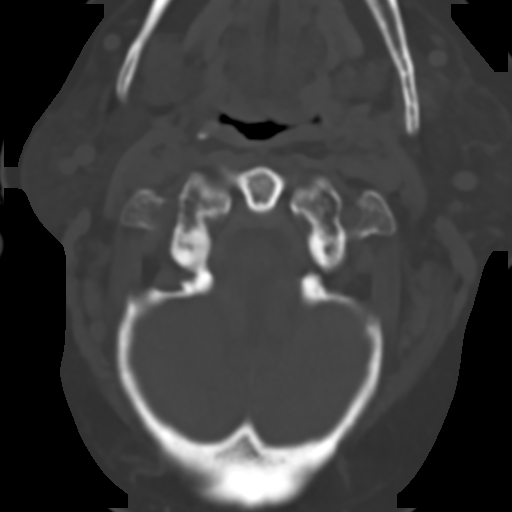

[Series 8: c_spine 2.0 sag bone · sagittal · 0.32mm/px · 5 of 61 slices shown, 6 images]
[im 21/61  bone]
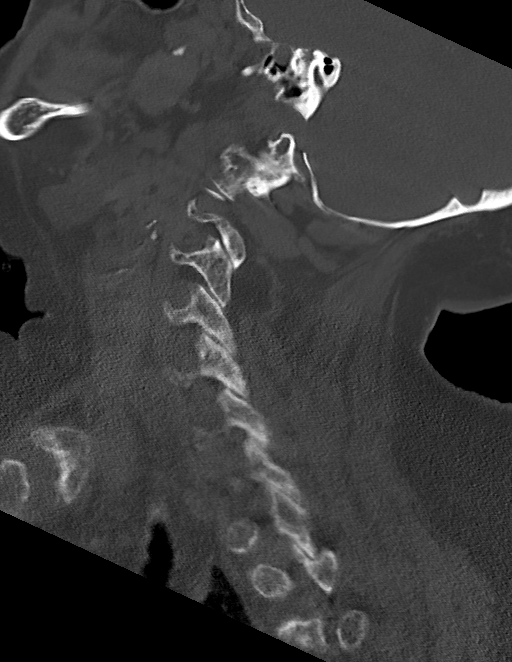
[im 26/61  bone]
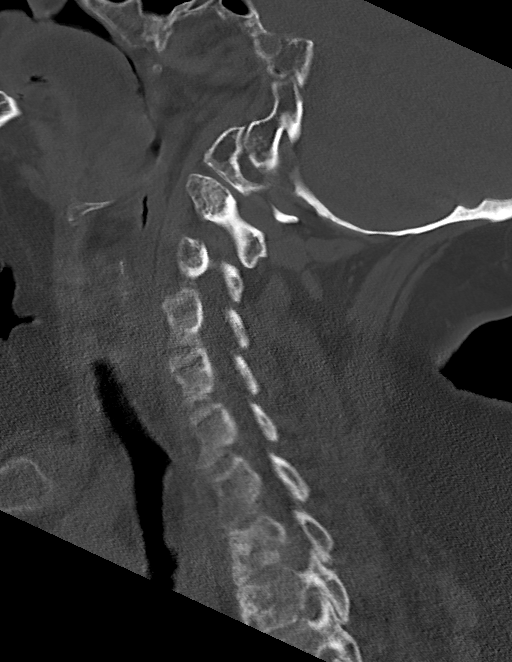
[im 31/61  soft-tissue]
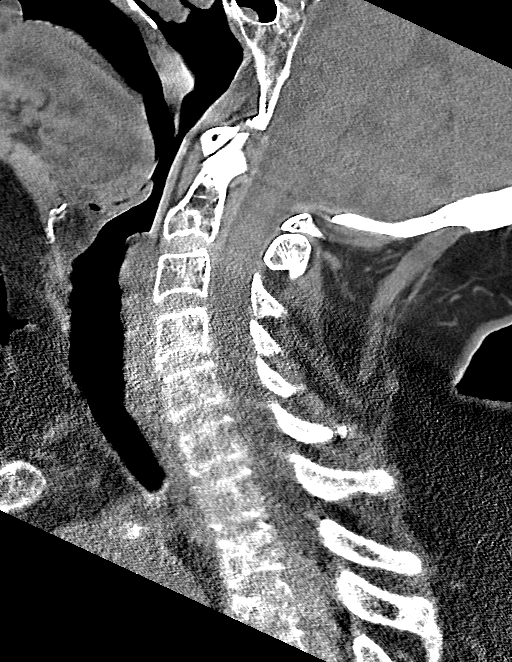
[im 31/61  bone]
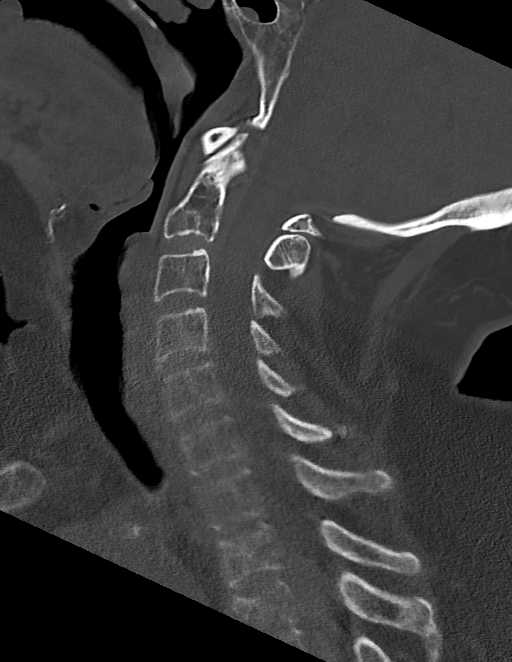
[im 36/61  bone]
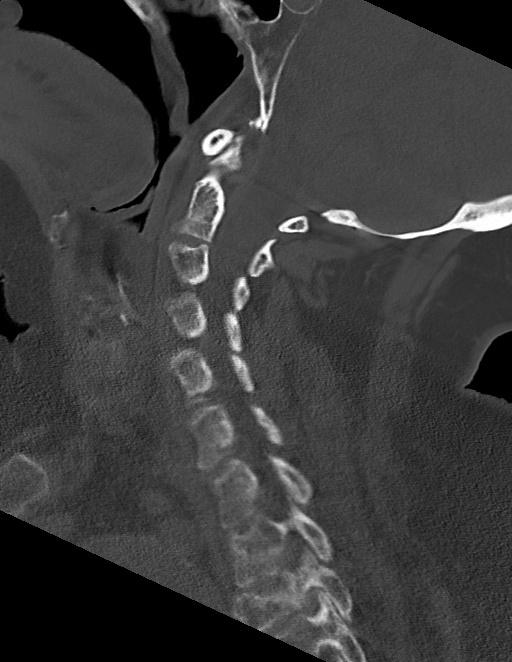
[im 41/61  bone]
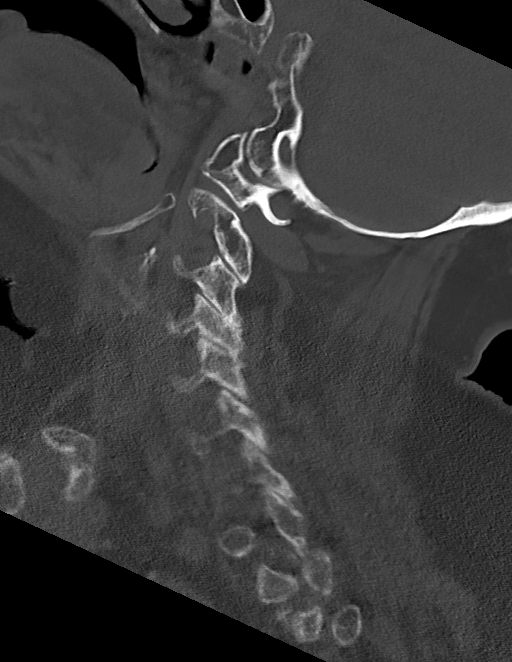

[Series 9: c_spine 2.0 cor bone · coronal · 0.29mm/px · 3 of 82 slices shown]
[im 25/82  bone]
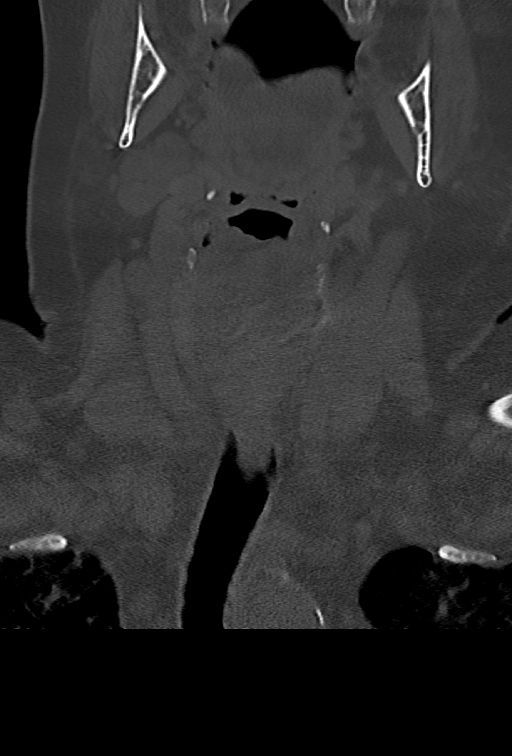
[im 36/82  bone]
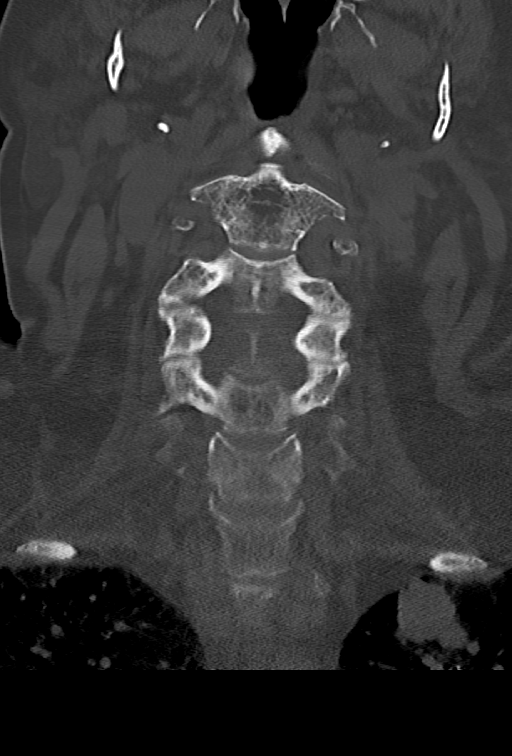
[im 46/82  bone]
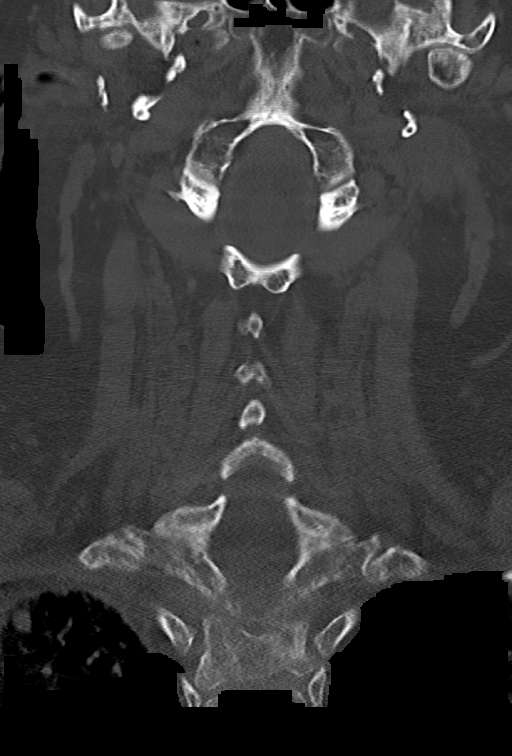

[13 of 33 positions shown; findings below may reference images not displayed]

FINDINGS: Alignment: No acute subluxation.

Skull base and vertebrae: Bony destructive lesion again seen at the
anterior left mastoid air cells with associated adjacent irregular
soft tissue mass density. The soft tissue mass appears decreased in
size since previous study. No acute vertebral body fracture
identified. Diffuse heterogeneous lucent appearance of the vertebral
bodies especially in the lower cervical and upper thoracic spine,
suggesting metastatic disease. Associated apparent soft tissue
density extending dorsally into the spinal canal from C4 through C7.

Soft tissues and spinal canal: No prevertebral soft tissue edema. No
evidence of visible canal hematoma.

Disc levels: Mild intervertebral disc space narrowing throughout the
cervical spine.

Upper chest: Multiple metastatic pulmonary nodules are visualized in
the upper lungs which are increased since previous study measuring
up to 2 cm near the left apex. Prominent superior mediastinal lymph
nodes.

Other: None.
IMPRESSION: 1. Extensive osseous metastatic disease especially in the lower
cervical and visualized upper thoracic vertebral bodies. Associated
apparent soft tissue density extending dorsally into the spinal
canal from C4 through C7, limited visualization with CT. Correlate
clinically and consider follow-up MRI with contrast to evaluate
degree of spinal canal stenoses and possible cord compression if
indicated.
2. Multiple metastatic pulmonary nodules which are increased in size
since previous study.
3. Redemonstration and slightly decreased size of an invasive
irregular mass deep to the left parotid with destruction and
invasion into the left mastoids as seen previously.

## 2021-08-06 IMAGING — CT CT ANGIO CHEST
2 of 7 series · 16 of 46 positions shown · IV contrast (APPLIED)
Comparison: [DATE], [DATE].

CLINICAL DATA: Worsening shortness of breath, known cancer,
pleuritic chest discomfort.

EXAM:
CT ANGIOGRAPHY CHEST WITH CONTRAST
TECHNIQUE: Multidetector CT imaging of the chest was performed using the
standard protocol during bolus administration of intravenous
contrast. Multiplanar CT image reconstructions and MIPs were
obtained to evaluate the vascular anatomy.

[Series 8: thins · axial · 0.98mm/px · z∈[+1010,+1242]mm · 13 of 374 slices shown]
[im 21/374  lung]
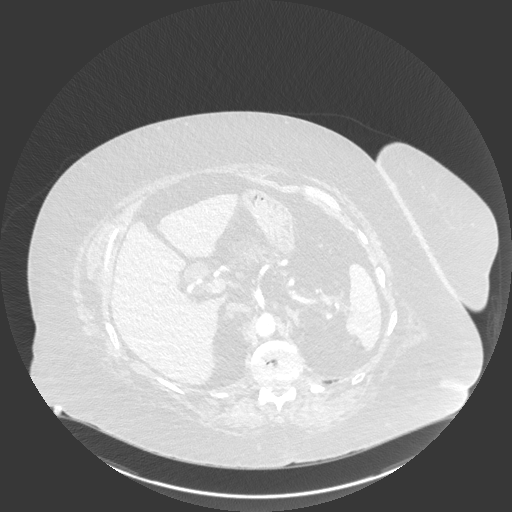
[im 42/374  soft-tissue]
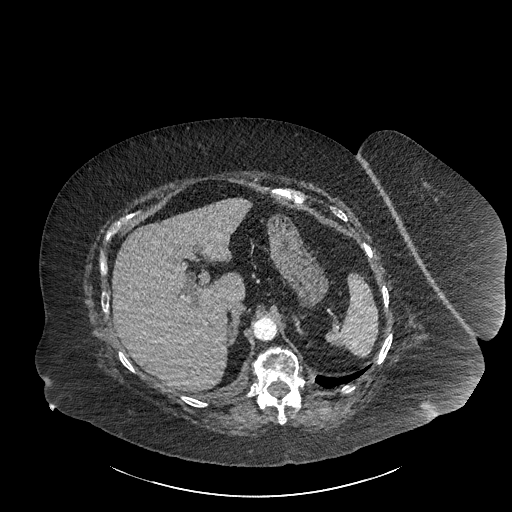
[im 83/374  lung]
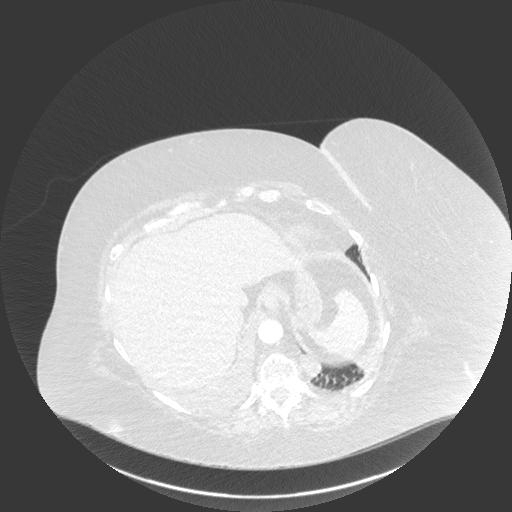
[im 104/374  soft-tissue]
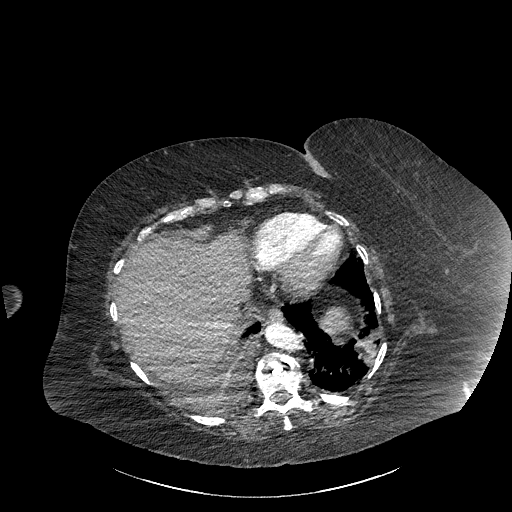
[im 125/374  lung]
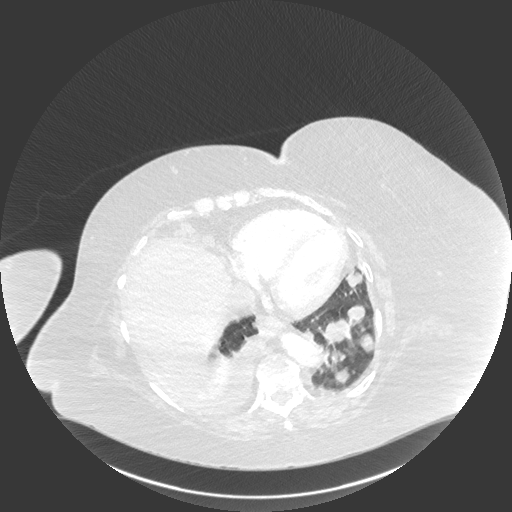
[im 166/374  soft-tissue]
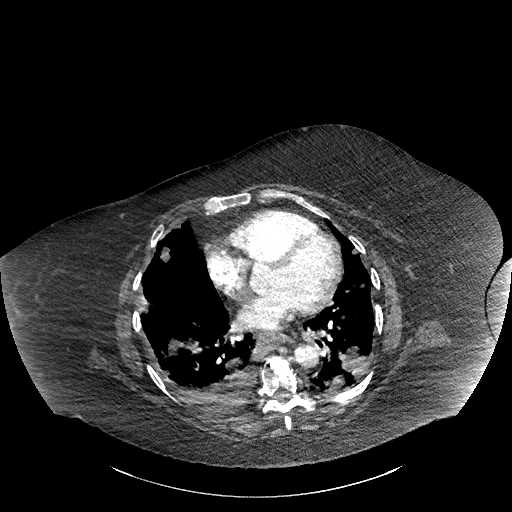
[im 187/374  lung]
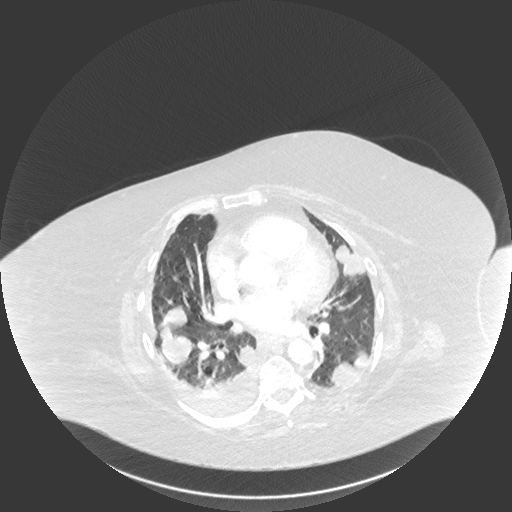
[im 208/374  soft-tissue]
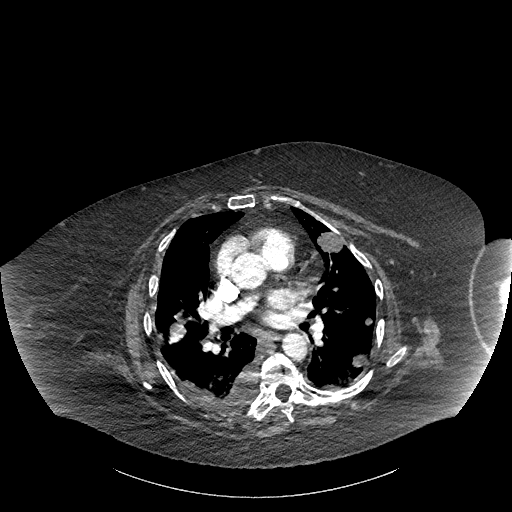
[im 249/374  lung]
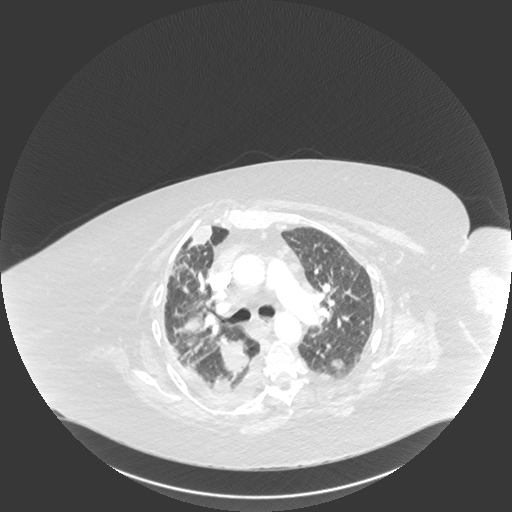
[im 270/374  soft-tissue]
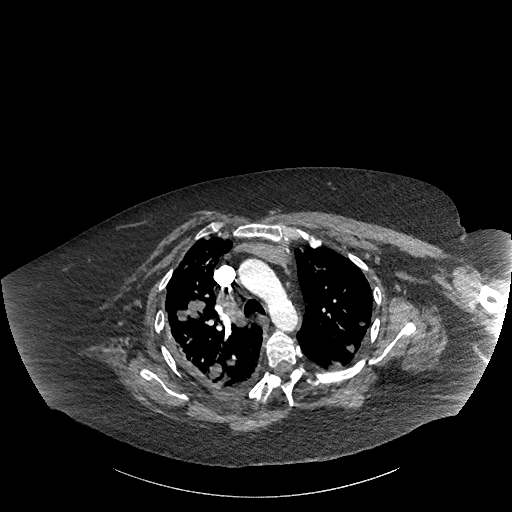
[im 291/374  lung]
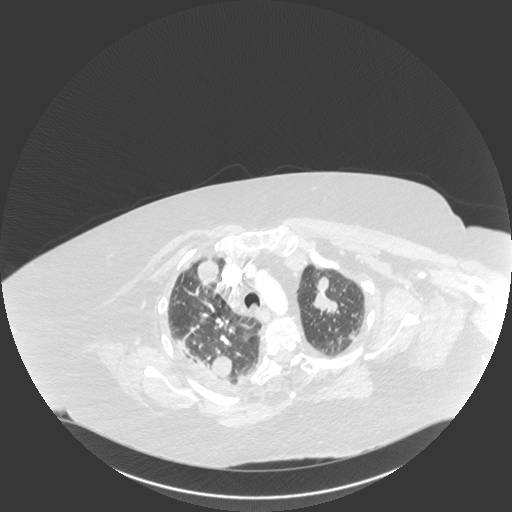
[im 332/374  soft-tissue]
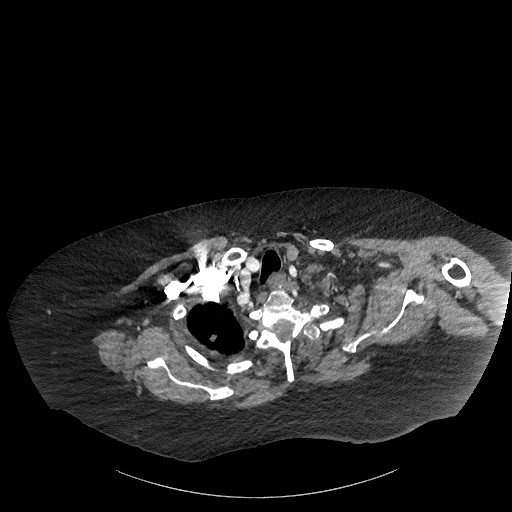
[im 353/374  lung]
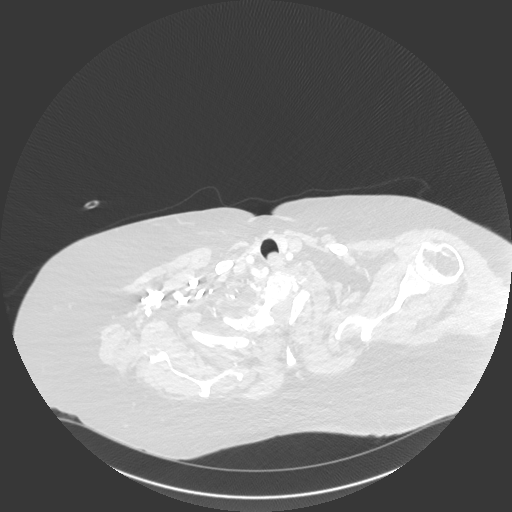

[Series 10: cor · coronal · 0.57mm/px · 3 of 166 slices shown]
[im 42/166  soft-tissue]
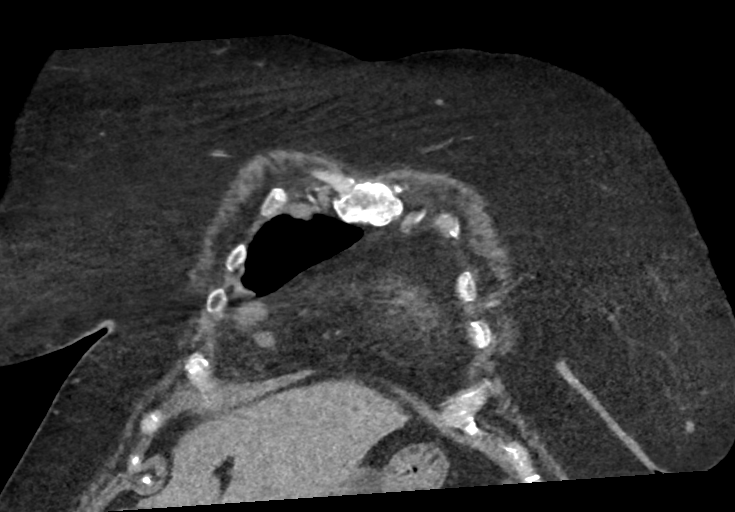
[im 83/166  soft-tissue]
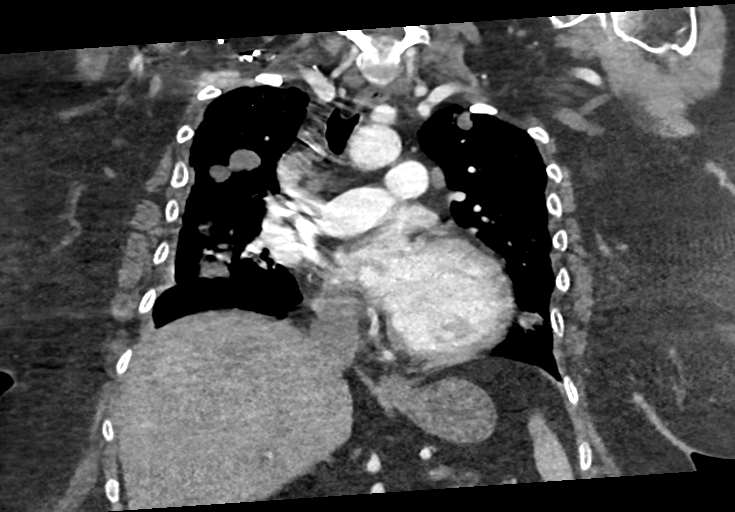
[im 124/166  soft-tissue]
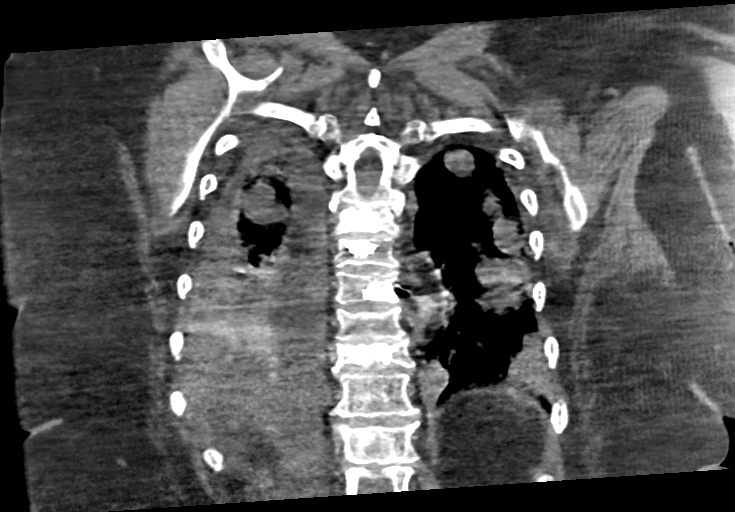

[16 of 46 positions shown; findings below may reference images not displayed]

RADIATION DOSE REDUCTION: This exam was performed according to the
departmental dose-optimization program which includes automated
exposure control, adjustment of the mA and/or kV according to
patient size and/or use of iterative reconstruction technique.

CONTRAST:  98mL OMNIPAQUE IOHEXOL 350 MG/ML SOLN
FINDINGS: Cardiovascular: The heart is enlarged and no pericardial effusion is
seen. Scattered coronary artery calcifications are noted. There is
atherosclerotic calcification of the aorta without evidence of
aneurysm. The pulmonary trunk is normal in caliber. No large central
pulmonary artery filling defect. Evaluation of the pulmonary
arteries is limited due to respiratory motion artifact.

Mediastinum/Nodes: The thyroid gland, trachea, and esophagus are
within normal limits. Enlarged lymph nodes are present in the
mediastinum and right hilum. No axillary lymphadenopathy.

Lungs/Pleura: Multiple pulmonary nodules and masses are seen
bilaterally, the largest measuring 3.1 cm in the left lower lobe
there is pleural thickening on the right with a small loculated
pleural effusion and right lower lobe consolidation. No pneumothorax
is seen bilaterally.

Upper Abdomen: Visualized portion of the common bile duct is
prominent in size at 1.2 cm.

Musculoskeletal: There is bony deformity of the proximal sternum
suggesting old healed fracture. Mixed lytic and sclerotic lesions
are present throughout the bones. There are compression deformities
at T1, T2, T3, T5, T6, T7, T8, T9, T10, T12, and L1, better
evaluated on recent MRI. Kyphoplasty changes are present at T10, T8,
and T7

Review of the MIP images confirms the above findings.
IMPRESSION: 1. No large central pulmonary embolus is identified. Evaluation of
the pulmonary arteries is limited due to respiratory motion
artifact. The need for short-term repeat evaluation should be
determined clinically.
2. Innumerable pulmonary nodules and masses bilaterally measuring up
to 3.1 cm, suspicious for metastatic disease. There is also pleural
thickening with a small loculated pleural effusion on the right.
Consolidation is noted at the right lung base and the possibility of
superimposed infection can not be excluded.
3. Multiple compression deformities, lytic and sclerotic lesions in
the bones, and kyphoplasty changes in the thoracic spine, better
evaluated on recent MRI.
4. Cardiomegaly with scattered coronary artery calcifications.
5. Aortic atherosclerosis.
6. Dilatation of the common bile duct measuring up to 1.2 cm.
Ultrasound is suggested for further evaluation.

## 2021-08-06 IMAGING — MR MR THORACIC SPINE WO/W CM
4 of 9 series · 15 of 48 positions shown · IV contrast (gadavist)
Comparison: Prior CT from earlier the same day as well as previous
MRI from [DATE].

CLINICAL DATA: Follow-up examination for metastatic disease.

EXAM:
MRI CERVICAL AND THORACIC SPINE WITHOUT AND WITH CONTRAST
TECHNIQUE: Multiplanar and multiecho pulse sequences of the cervical spine, to
include the craniocervical junction and cervicothoracic junction,
and the thoracic spine, were obtained without and with intravenous
contrast.
CONTRAST:  10mL GADAVIST GADOBUTROL 1 MMOL/ML IV SOLN

[Series 8: T1 · sagittal · 3.0mm · 0.90mm/px · 3 of 16 slices shown (1 of 2)]
[im 1/16]
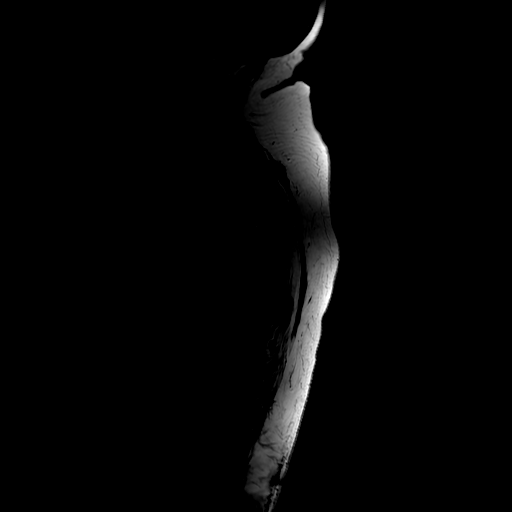
[im 8/16]
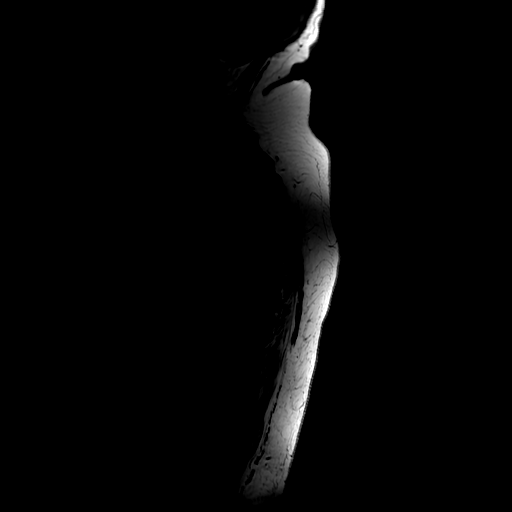
[im 16/16]
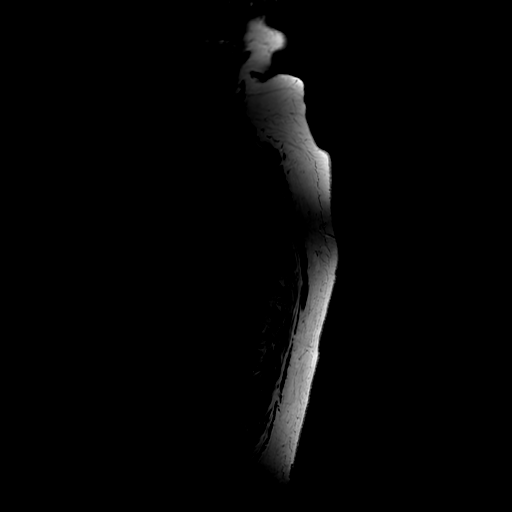

[Series 15: T2 · sagittal · 3.0mm · 0.66mm/px · 4 of 23 slices shown (1 of 2)]
[im 1/23]
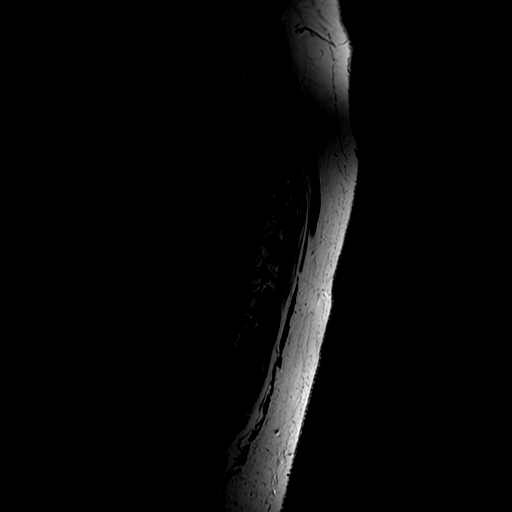
[im 8/23]
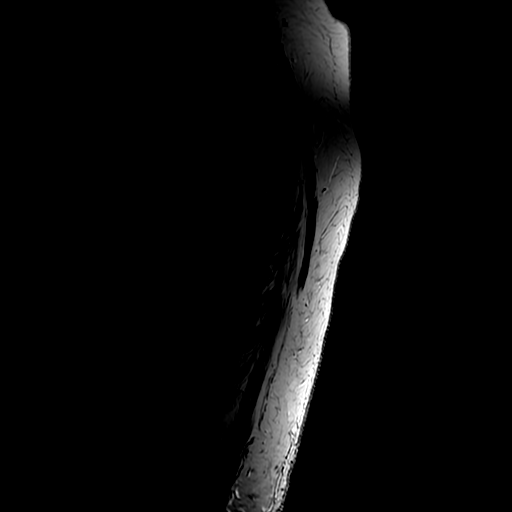
[im 15/23]
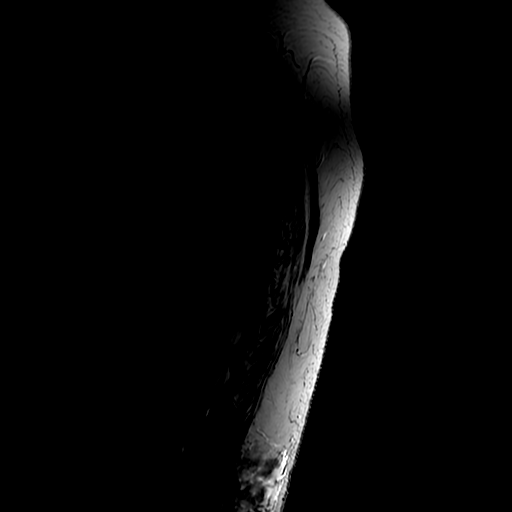
[im 23/23]
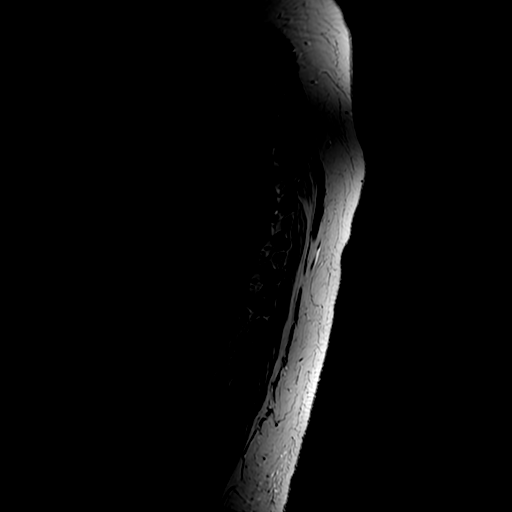

[Series 17: T1 · sagittal · 3.0mm · 0.66mm/px · 3 of 23 slices shown (2 of 2)]
[im 1/23]
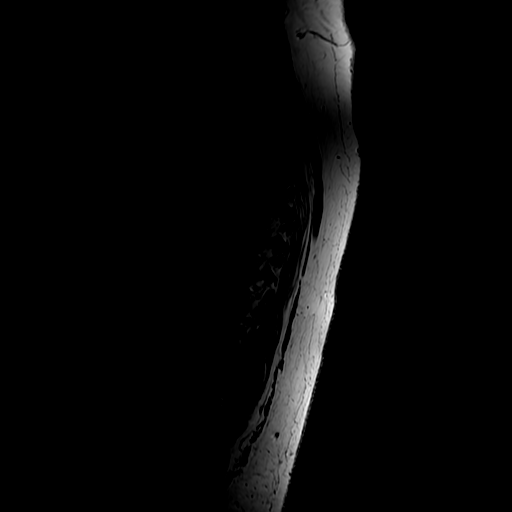
[im 12/23]
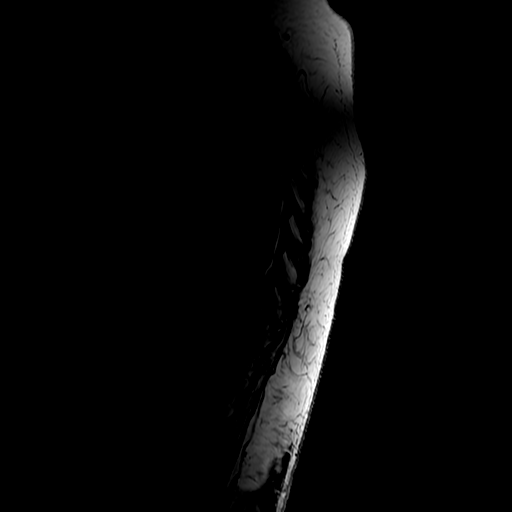
[im 23/23]
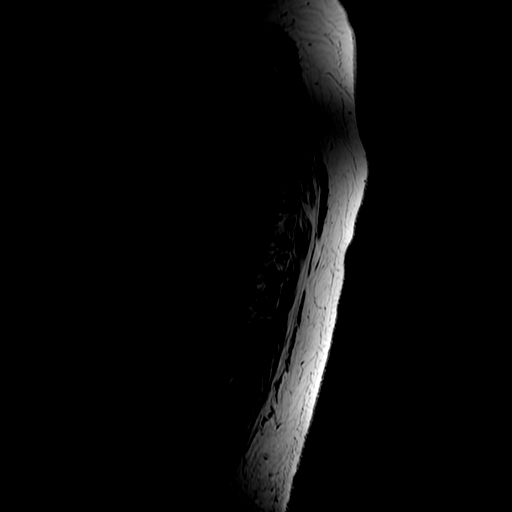

[Series 18: T2 · axial · 4.0mm · 0.39mm/px · z∈[-436,-174]mm · 5 of 59 slices shown (2 of 2)]
[im 1/59]
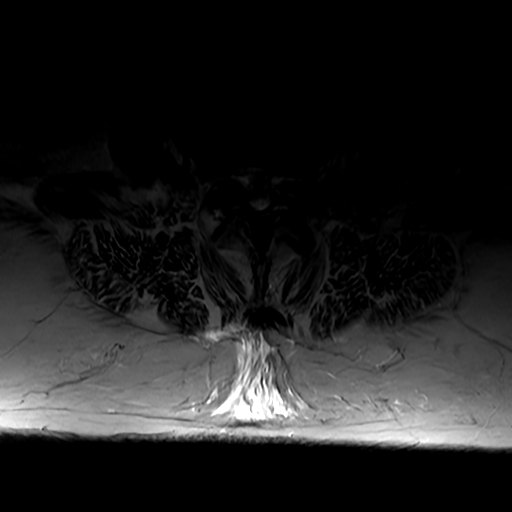
[im 9/59]
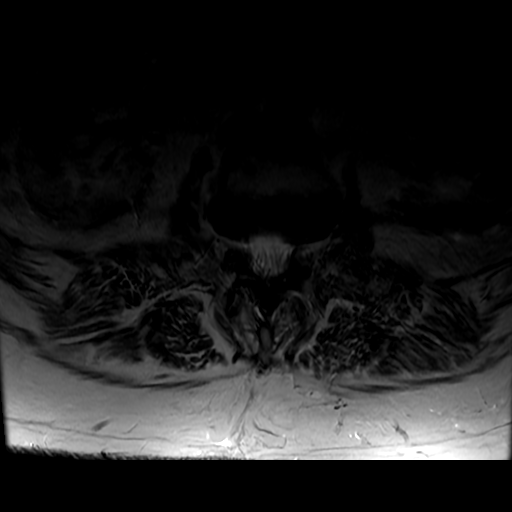
[im 17/59]
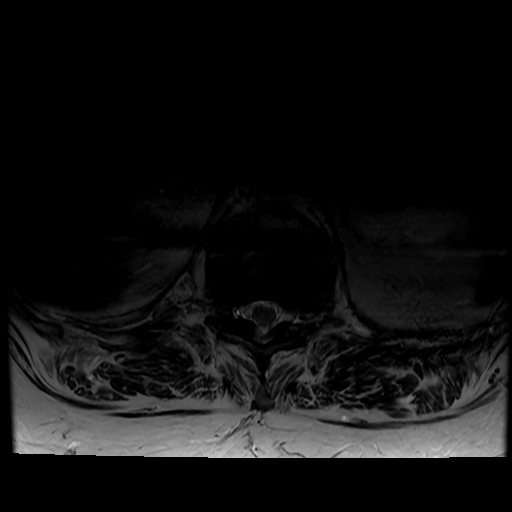
[im 34/59]
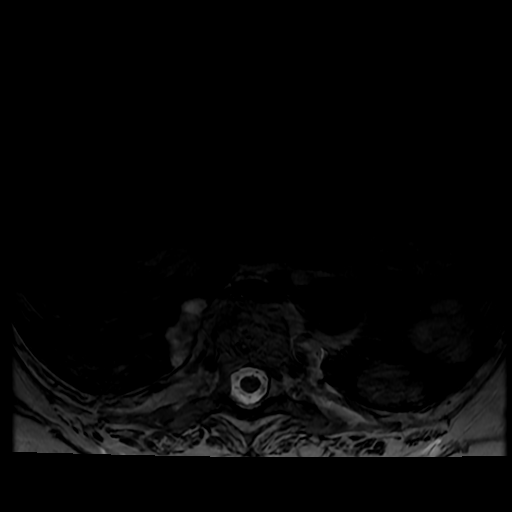
[im 50/59]
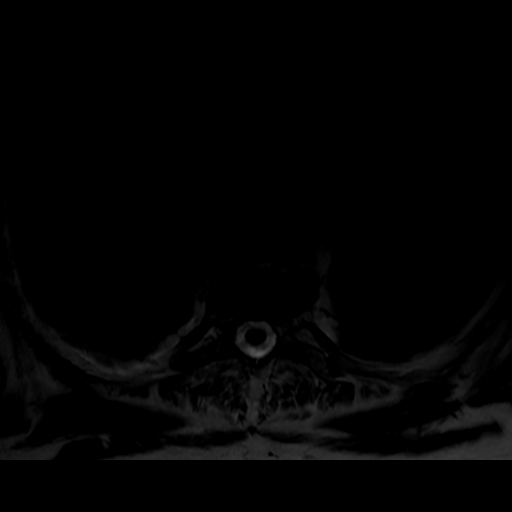

[15 of 48 positions shown; findings below may reference images not displayed]

FINDINGS: MRI CERVICAL SPINE FINDINGS

Alignment: Examination moderately to severely degraded by motion
artifact.

Vertebral bodies normally aligned with preservation of the normal
cervical lordosis. No listhesis.

Vertebrae: Metastatic disease involving the upper thoracic spine
noted, discussed on corresponding thoracic portion of this exam.
Otherwise, signal intensity within the visualized bone marrow of the
cervical spine is within normal limits. No metastatic involvement
within the cervical spine itself. No other discrete or worrisome
osseous lesions. No other abnormal marrow edema or enhancement.
Vertebral body height maintained without acute or chronic fracture.

Cord: Signal intensity within the cervical spinal cord is grossly
within normal limits. No appreciable cord signal abnormality or
abnormal enhancement on this motion degraded exam.

Posterior Fossa, vertebral arteries, paraspinal tissues: Probable
chronic microvascular ischemic disease noted within the partially
visualized pons and brainstem. Visualized brain and posterior fossa
otherwise unremarkable. Craniocervical junction within normal
limits. Paraspinous soft tissues within normal limits. Normal flow
voids seen within the vertebral arteries bilaterally.

Disc levels:

Mild for age degenerative disc desiccation with minimal disc bulging
throughout the cervical spine. No significant spinal stenosis or
frank cord impingement. Foramina appear grossly patent.

MRI THORACIC SPINE FINDINGS

Alignment: Examination moderately to severely degraded by motion
artifact.

Vertebral bodies normally aligned with preservation of the normal
thoracic kyphosis. Trace anterolisthesis of T9 on T10, with trace
retrolisthesis of T10 on T11 noted.

Vertebrae: Abnormal T1 hypointense, stir hyperintense enhancing
lesions seen involving the T1 and T2 vertebral bodies, consistent
with metastatic disease. Associated mild height loss at these levels
consistent with associated pathologic fractures, most pronounced at
the T2 level where height loss measures up to approximately 30-40%.
Epidural extension with tumor seen involving the right greater than
left ventral epidural space at this level (series 21, image 14).
Mild flattening and mass effect on the thoracic cord posteriorly
with up to moderate spinal stenosis. No visible cord signal changes
on this motion degraded exam. Lateral extension to involve the T1-2
neural foramina bilaterally which are largely obliterated.
Additional extension into the left greater than right T2-3 neural
foramina with mild to moderate bilateral foraminal stenosis.
Additional extension to involve the posterior elements including the
bilateral pedicles and posterior facets noted as well.

Additional metastatic lesion involving the T6 vertebral body seen as
well. Associated pathologic compression fracture with up to 50%
height loss. Epidural extension of tumor with small volume tumor
seen extending into the ventral epidural space (series 21, image
12). No more than mild spinal stenosis at this level.

No other metastatic disease seen within the thoracic spine. Late
subacute to chronic compression fracture involving the superior
endplate of T12 with mild 30% height loss and 3 mm bony retropulsion
noted. Multiple additional chronic compression fractures noted
elsewhere throughout the thoracic spine, involving the T3, T5, T7,
T8, and T10 vertebral bodies. Additional chronic compression
fractures involving the upper lumbar spine at L1 through L3 noted.
Sequelae of prior vertebral augmentation present at T7, T8, and T10.

Cord: Signal intensity within the thoracic spinal cord is within
normal limits. No appreciable cord signal changes on this motion
degraded exam.

Paraspinal and other soft tissues: Moderate right with small left
layering pleural effusions. Innumerable pulmonary nodules seen
throughout the visualized lungs, consistent with metastatic disease.

Disc levels:

Multilevel degenerative disc bulging seen throughout the thoracic
spine extending from C5-6 through the visualized upper lumbar spine.
Superimposed multilevel facet arthropathy. No other high-grade
spinal stenosis. Neural foramina otherwise appear to be largely
patent.
IMPRESSION: 1. Motion degraded exam.
2. Osseous metastatic disease involving the T1 and T2 vertebral
bodies with associated pathologic fractures. Epidural extension with
tumor extending into the ventral epidural space at these levels,
resulting in up to moderate spinal stenosis and cord flattening. No
visible cord signal changes on this motion degraded exam.
3. Additional metastatic lesion involving the T6 vertebral body with
associated pathologic fracture and up to 50% height loss. Small
volume ventral epidural extension of tumor with no more than mild
spinal stenosis at this level.
4. No evidence for metastatic disease within the cervical spine.
5. Innumerable pulmonary nodules throughout the visualized lungs,
consistent with metastatic disease.
6. Late subacute to chronic compression fracture of the superior
endplate of T12 with mild 30% height loss and 3 mm bony
retropulsion. Multiple additional chronic compression fractures as
above.
7. Small to moderate bilateral pleural effusions, greater on the
right.
8. Underlying multilevel degenerative spondylosis and facet
degeneration throughout the cervicothoracic spine. No other
high-grade spinal stenosis or impingement.

## 2021-08-06 MED ORDER — OXYCODONE HCL 5 MG PO TABS
10.0000 mg | ORAL_TABLET | ORAL | Status: DC | PRN
  Administered 2021-08-07 – 2021-08-10 (×9): 10 mg via ORAL
  Filled 2021-08-06 (×10): qty 2

## 2021-08-06 MED ORDER — PREGABALIN 100 MG PO CAPS: 100.0000 mg | ORAL_CAPSULE | Freq: Two times a day (BID) | ORAL | Status: DC

## 2021-08-06 MED ORDER — INSULIN ASPART 100 UNIT/ML IJ SOLN: 0.0000 [IU] | INTRAMUSCULAR | Status: DC

## 2021-08-06 MED ORDER — GADOBUTROL 1 MMOL/ML IV SOLN: 10.0000 mL | Freq: Once | INTRAVENOUS | Status: DC | PRN

## 2021-08-06 MED ORDER — SERTRALINE HCL 100 MG PO TABS: 100.0000 mg | ORAL_TABLET | Freq: Two times a day (BID) | ORAL | Status: DC

## 2021-08-06 MED ORDER — PANTOPRAZOLE SODIUM 40 MG PO TBEC
40.0000 mg | DELAYED_RELEASE_TABLET | Freq: Two times a day (BID) | ORAL | Status: DC
  Administered 2021-08-07 – 2021-08-09 (×7): 40 mg via ORAL
  Filled 2021-08-06 (×7): qty 1

## 2021-08-06 MED ORDER — HYDROMORPHONE HCL 1 MG/ML IJ SOLN
1.0000 mg | Freq: Once | INTRAMUSCULAR | Status: AC
  Administered 2021-08-06: 1 mg via INTRAVENOUS
  Filled 2021-08-06: qty 1

## 2021-08-06 MED ORDER — PRAVASTATIN SODIUM 40 MG PO TABS
80.0000 mg | ORAL_TABLET | Freq: Every day | ORAL | Status: DC
  Administered 2021-08-07 – 2021-08-09 (×3): 80 mg via ORAL
  Filled 2021-08-06 (×3): qty 2

## 2021-08-06 MED ORDER — PRAZOSIN HCL 1 MG PO CAPS
5.0000 mg | ORAL_CAPSULE | Freq: Every day | ORAL | Status: DC
  Administered 2021-08-07 – 2021-08-09 (×4): 5 mg via ORAL
  Filled 2021-08-06 (×4): qty 1

## 2021-08-06 MED ORDER — ALPRAZOLAM 0.25 MG PO TABS: 0.5000 mg | ORAL_TABLET | Freq: Two times a day (BID) | ORAL | Status: DC | PRN

## 2021-08-06 MED ORDER — FUROSEMIDE 10 MG/ML IJ SOLN
40.0000 mg | Freq: Once | INTRAMUSCULAR | Status: AC
Start: 2021-08-06 — End: 2021-08-07
  Administered 2021-08-07: 40 mg via INTRAVENOUS
  Filled 2021-08-06: qty 4

## 2021-08-06 MED ORDER — SERTRALINE HCL 100 MG PO TABS
200.0000 mg | ORAL_TABLET | Freq: Every day | ORAL | Status: DC
  Administered 2021-08-07 – 2021-08-09 (×3): 200 mg via ORAL
  Filled 2021-08-06 (×3): qty 2

## 2021-08-06 MED ORDER — PREGABALIN 50 MG PO CAPS
150.0000 mg | ORAL_CAPSULE | Freq: Two times a day (BID) | ORAL | Status: DC
  Administered 2021-08-07 – 2021-08-09 (×6): 150 mg via ORAL
  Filled 2021-08-06 (×5): qty 1
  Filled 2021-08-06: qty 2

## 2021-08-06 MED ORDER — FENTANYL CITRATE PF 50 MCG/ML IJ SOSY
50.0000 ug | PREFILLED_SYRINGE | Freq: Once | INTRAMUSCULAR | Status: AC
  Administered 2021-08-06: 50 ug via INTRAVENOUS
  Filled 2021-08-06: qty 1

## 2021-08-06 MED ORDER — UMECLIDINIUM BROMIDE 62.5 MCG/ACT IN AEPB
1.0000 | INHALATION_SPRAY | Freq: Every day | RESPIRATORY_TRACT | Status: DC
  Filled 2021-08-06: qty 7

## 2021-08-06 MED ORDER — GADOBUTROL 1 MMOL/ML IV SOLN
10.0000 mL | Freq: Once | INTRAVENOUS | Status: AC | PRN
  Administered 2021-08-06: 10 mL via INTRAVENOUS

## 2021-08-06 MED ORDER — DIAZEPAM 5 MG PO TABS
5.0000 mg | ORAL_TABLET | Freq: Four times a day (QID) | ORAL | Status: DC | PRN
  Administered 2021-08-07 – 2021-08-09 (×10): 5 mg via ORAL
  Filled 2021-08-06 (×10): qty 1

## 2021-08-06 MED ORDER — LAMOTRIGINE 25 MG PO TABS
25.0000 mg | ORAL_TABLET | Freq: Every day | ORAL | Status: DC
  Administered 2021-08-07 – 2021-08-09 (×4): 25 mg via ORAL
  Filled 2021-08-06 (×4): qty 1

## 2021-08-06 MED ORDER — FENTANYL CITRATE PF 50 MCG/ML IJ SOSY
50.0000 ug | PREFILLED_SYRINGE | INTRAMUSCULAR | Status: DC | PRN
  Administered 2021-08-07 – 2021-08-08 (×6): 50 ug via INTRAVENOUS
  Filled 2021-08-06 (×6): qty 1

## 2021-08-06 MED ORDER — BUSPIRONE HCL 10 MG PO TABS: 30.0000 mg | ORAL_TABLET | Freq: Two times a day (BID) | ORAL | Status: DC

## 2021-08-06 MED ORDER — ALBUTEROL SULFATE (2.5 MG/3ML) 0.083% IN NEBU: 2.5000 mg | INHALATION_SOLUTION | RESPIRATORY_TRACT | Status: DC | PRN

## 2021-08-06 MED ORDER — BACLOFEN 10 MG PO TABS
10.0000 mg | ORAL_TABLET | Freq: Three times a day (TID) | ORAL | Status: DC | PRN
  Filled 2021-08-06: qty 1

## 2021-08-06 MED ORDER — VALACYCLOVIR HCL 500 MG PO TABS
1000.0000 mg | ORAL_TABLET | Freq: Two times a day (BID) | ORAL | Status: DC
Start: 2021-08-06 — End: 2021-08-10
  Administered 2021-08-07 – 2021-08-09 (×7): 1000 mg via ORAL
  Filled 2021-08-06 (×8): qty 2

## 2021-08-06 MED ORDER — BUDESON-GLYCOPYRROL-FORMOTEROL 160-9-4.8 MCG/ACT IN AERO: 2.0000 | INHALATION_SPRAY | Freq: Two times a day (BID) | RESPIRATORY_TRACT | Status: DC

## 2021-08-06 MED ORDER — IOHEXOL 350 MG/ML SOLN
98.0000 mL | Freq: Once | INTRAVENOUS | Status: AC | PRN
  Administered 2021-08-06: 98 mL via INTRAVENOUS

## 2021-08-06 MED ORDER — HYDROCODONE-ACETAMINOPHEN 5-325 MG PO TABS
1.0000 | ORAL_TABLET | Freq: Once | ORAL | Status: AC
  Administered 2021-08-06: 1 via ORAL
  Filled 2021-08-06: qty 1

## 2021-08-06 MED ORDER — FUROSEMIDE 40 MG PO TABS
40.0000 mg | ORAL_TABLET | Freq: Every day | ORAL | Status: DC
Start: 2021-08-07 — End: 2021-08-10
  Administered 2021-08-07 – 2021-08-09 (×3): 40 mg via ORAL
  Filled 2021-08-06: qty 1
  Filled 2021-08-06: qty 2
  Filled 2021-08-06: qty 1

## 2021-08-06 MED ORDER — HYDROMORPHONE HCL 1 MG/ML IJ SOLN: 1.0000 mg | INTRAMUSCULAR | Status: DC | PRN

## 2021-08-06 MED ORDER — FLUTICASONE FUROATE-VILANTEROL 100-25 MCG/ACT IN AEPB
1.0000 | INHALATION_SPRAY | Freq: Every day | RESPIRATORY_TRACT | Status: DC
  Administered 2021-08-07: 1 via RESPIRATORY_TRACT
  Filled 2021-08-06: qty 28

## 2021-08-06 MED ORDER — BUSPIRONE HCL 5 MG PO TABS
30.0000 mg | ORAL_TABLET | Freq: Every day | ORAL | Status: DC
  Administered 2021-08-07 – 2021-08-09 (×3): 30 mg via ORAL
  Filled 2021-08-06 (×4): qty 6

## 2021-08-06 MED ORDER — ACETAMINOPHEN 325 MG PO TABS
650.0000 mg | ORAL_TABLET | Freq: Four times a day (QID) | ORAL | Status: DC
  Administered 2021-08-07 – 2021-08-10 (×13): 650 mg via ORAL
  Filled 2021-08-06 (×13): qty 2

## 2021-08-06 NOTE — ED Notes (Signed)
Pt to imaging

## 2021-08-06 NOTE — ED Notes (Signed)
Patient back from US.

## 2021-08-06 NOTE — ED Provider Triage Note (Signed)
Emergency Medicine Provider Triage Evaluation Note  Theresa Fernandez , a 66 y.o. female  was evaluated in triage.  Pt complains of left-sided shoulder blade pain radiating down to the left.  She has associated paresthesia.  This has been worsening for the past several months.  She is an obese female with large breasts.  She has a history of sinus carcinoma.  She is been taking muscle relaxers without relief.  Review of Systems  Positive: Shoulder pain, paresthesia Negative: Weakness  Physical Exam  BP 138/77    Pulse 78    Temp 98.4 F (36.9 C) (Oral)    Resp (!) 22    SpO2 98%  Gen:   Awake, no distress   Resp:  Normal effort  MSK:   Moves extremities without difficulty  Other:  Active trigger points and tenderness to palpation in the left shoulder blade region.  Pendulous heavy breasts and short and rounded forward pectoralis muscles  Medical Decision Making  Medically screening exam initiated at 11:24 AM.  Appropriate orders placed.  Ala Kratz was informed that the remainder of the evaluation will be completed by another provider, this initial triage assessment does not replace that evaluation, and the importance of remaining in the ED until their evaluation is complete.  Suspect this is musculoskeletal.  CT ordered for paresthesia of the left arm also likely referred from trigger points.  Work-up initiated   Margarita Mail, PA-C 08/06/21 [id number]

## 2021-08-06 NOTE — ED Notes (Addendum)
Pt reporting 1 mg dilaudid administered for pain was ineffective. Pain remains 8/10, pt visibly uncomfortable. MD Tegeler notified of continued pain. Order for Fentanyl 50 mcg obtained.

## 2021-08-06 NOTE — ED Notes (Signed)
Patient cleaned up and purewick replaced

## 2021-08-06 NOTE — ED Provider Notes (Signed)
[facility] EMERGENCY DEPARTMENT Provider Note   CSN: [id number] Arrival date & time: 08/06/21  1032     History  Chief Complaint  Patient presents with   Spasms    Theresa Fernandez is a 66 y.o. female.  Who agrees with medicine admission for pain control and so that they can see her in the morning.  They requested she be made n.p.o. at midnight for Dr. Laurena Bering team to see.  I spoke with medicine who agrees with admission.  Who agrees with medicine admission given the uncontrolled pain and the neurologic deficits.  Who agrees with  The history is provided by the patient, the spouse and medical records. No language interpreter was used.  Back Pain Location:  Thoracic spine Associated symptoms: abdominal pain, chest pain, numbness and weakness   Associated symptoms: no dysuria, no fever and no headaches   Neck Injury This is a new problem. The current episode started more than 1 week ago. The problem occurs constantly. The problem has been gradually worsening. Associated symptoms include chest pain, abdominal pain and shortness of breath. Pertinent negatives include no headaches. The symptoms are aggravated by twisting and bending. Nothing relieves the symptoms. Treatments tried: oxycodone. The treatment provided mild relief.      Home Medications Prior to Admission medications   Medication Sig Start Date End Date Taking? Authorizing Provider  albuterol (ACCUNEB) 0.63 MG/3ML nebulizer solution Take 3 mLs (0.63 mg total) by nebulization every 4 (four) hours as needed for wheezing or shortness of breath. 07/17/20  Yes Icard, Bradley L, DO  albuterol (VENTOLIN HFA) 108 (90 Base) MCG/ACT inhaler Inhale 2 puffs into the lungs every 6 (six) hours as needed for wheezing or shortness of breath (wheezing). 07/17/20  Yes Icard, Octavio Graves, DO  ALPRAZolam (XANAX) 0.5 MG tablet Take 0.5 mg by mouth 2 (two) times daily as needed for anxiety. 11/28/19  Yes [provider]  baclofen (LIORESAL) 10 MG tablet Take 10 mg by mouth 3 (three) times daily as needed for muscle spasms. 07/28/21  Yes [provider]  Budeson-Glycopyrrol-Formoterol (BREZTRI AEROSPHERE) 160-9-4.8 MCG/ACT AERO Inhale 2 puffs into the lungs in the morning and at bedtime. 05/17/21  Yes Icard, Bradley L, DO  busPIRone (BUSPAR) 15 MG tablet Take 30 mg by mouth 2 (two) times daily.   Yes [provider]  furosemide (LASIX) 40 MG tablet Take 1 tablet (40 mg total) by mouth 2 (two) times daily. Patient taking differently: Take 40 mg by mouth daily as needed for fluid or edema. 10/19/18  Yes Hilty, Nadean Corwin, MD  hydrOXYzine (ATARAX/VISTARIL) 25 MG tablet Take 25 mg by mouth daily. 04/21/18  Yes [provider]  lamoTRIgine (LAMICTAL) 25 MG tablet Take 25 mg by mouth at bedtime.  04/21/18  Yes [provider]  lovastatin (MEVACOR) 40 MG tablet Take 80 mg by mouth daily. 03/18/20  Yes [provider]  naloxone (NARCAN) nasal spray 4 mg/0.1 mL Place 4 mg into the nose as needed for opioid reversal. 04/07/21  Yes [provider]  oxyCODONE-acetaminophen (PERCOCET) 10-325 MG tablet Take 1 tablet by mouth See admin instructions. Every 4 to 6 hours as needed for pain 08/01/21  Yes [provider]  pantoprazole (PROTONIX) 40 MG tablet Take 1 tablet (40 mg total) by mouth 2 (two) times daily. 08/01/20 09/29/21 Yes Swayze, Ava, DO  pioglitazone (ACTOS) 15 MG tablet Take 15 mg by mouth daily. 09/13/19  Yes [provider]  prazosin (MINIPRESS) 5  MG capsule Take 5 mg by mouth at bedtime. 12/04/19  Yes [provider]  pregabalin (LYRICA) 150 MG capsule Take 1 capsule (150 mg total) by mouth at bedtime. Patient taking differently: Take 100 mg by mouth 2 (two) times daily. 02/11/13  Yes Johnson, Clanford L, MD  pregabalin (LYRICA) 150 MG capsule Take 1 capsule by mouth 2 (two) times daily. 08/15/20  Yes [provider]  sertraline (ZOLOFT)  100 MG tablet Take 200 mg by mouth in the morning and at bedtime.   Yes [provider]  Tiotropium Bromide-Olodaterol (STIOLTO RESPIMAT) 2.5-2.5 MCG/ACT AERS Inhale 2 puffs into the lungs daily. Patient taking differently: Inhale 2 puffs into the lungs in the morning and at bedtime. 07/17/20  Yes Icard, Octavio Graves, DO  valACYclovir (VALTREX) 1000 MG tablet Take 1,000 mg by mouth 2 (two) times daily. 07/28/21  Yes [provider]  budesonide-formoterol (SYMBICORT) 80-4.5 MCG/ACT inhaler Inhale 2 puffs into the lungs 2 (two) times daily. Patient not taking: No sig reported 07/20/18 11/23/19  Martyn Ehrich, NP      Allergies    Ceftriaxone, Topamax [topiramate], Capsaicin, and Voltaren [diclofenac sodium]    Review of Systems   Review of Systems  Constitutional:  Positive for fatigue. Negative for chills, diaphoresis and fever.  HENT:  Negative for congestion.   Eyes:  Negative for visual disturbance.  Respiratory:  Positive for chest tightness and shortness of breath. Negative for cough and wheezing.   Cardiovascular:  Positive for chest pain and leg swelling. Negative for palpitations.  Gastrointestinal:  Positive for abdominal pain. Negative for constipation, diarrhea, nausea and vomiting.  Genitourinary:  Negative for dysuria and flank pain.  Musculoskeletal:  Positive for back pain and neck pain.  Skin:  Negative for rash and wound.  Neurological:  Positive for facial asymmetry (at baseline), weakness and numbness. Negative for speech difficulty, light-headedness and headaches.  Psychiatric/Behavioral:  Negative for agitation and confusion.   All other systems reviewed and are negative.  Physical Exam Updated Vital Signs BP 135/76 (BP Location: Left Arm)    Pulse 79    Temp 98.6 F (37 C) (Oral)    Resp 16    SpO2 93%  Physical Exam Vitals and nursing note reviewed.  Constitutional:      General: She is not in acute distress.    Appearance: She is  well-developed. She is obese. She is not ill-appearing, toxic-appearing or diaphoretic.  HENT:     Head: Atraumatic.     Nose: No congestion or rhinorrhea.     Mouth/Throat:     Mouth: Mucous membranes are moist.     Pharynx: No oropharyngeal exudate or posterior oropharyngeal erythema.  Eyes:     Extraocular Movements: Extraocular movements intact.     Conjunctiva/sclera: Conjunctivae normal.     Pupils: Pupils are equal, round, and reactive to light.  Cardiovascular:     Rate and Rhythm: Normal rate and regular rhythm.     Pulses: Normal pulses.     Heart sounds: No murmur heard. Pulmonary:     Effort: Pulmonary effort is normal. No respiratory distress.     Breath sounds: Rales present. No wheezing or rhonchi.  Abdominal:     General: Abdomen is flat. There is no distension.     Palpations: Abdomen is soft.     Tenderness: There is no abdominal tenderness. There is no guarding or rebound.  Musculoskeletal:        General: Tenderness present. No swelling.  Cervical back: Neck supple. Tenderness present.     Right lower leg: Edema present.     Left lower leg: Edema present.  Skin:    General: Skin is warm and dry.     Capillary Refill: Capillary refill takes less than 2 seconds.     Findings: No erythema.  Neurological:     Mental Status: She is alert.     Sensory: Sensory deficit present.     Motor: Weakness present.  Psychiatric:        Mood and Affect: Mood normal.    ED Results / Procedures / Treatments   Labs (all labs ordered are listed, but only abnormal results are displayed) Labs Reviewed  CBC WITH DIFFERENTIAL/PLATELET - Abnormal; Notable for the following components:      Result Value   RBC 3.82 (*)    Hemoglobin 11.6 (*)    MCV 100.3 (*)    All other components within normal limits  COMPREHENSIVE METABOLIC PANEL - Abnormal; Notable for the following components:   Chloride 95 (*)    CO2 34 (*)    Creatinine, Ser 1.07 (*)    Albumin 3.2 (*)    GFR,  Estimated 57 (*)    All other components within normal limits  URINALYSIS, ROUTINE W REFLEX MICROSCOPIC - Abnormal; Notable for the following components:   APPearance CLOUDY (*)    Leukocytes,Ua LARGE (*)    All other components within normal limits  URINALYSIS, MICROSCOPIC (REFLEX) - Abnormal; Notable for the following components:   Bacteria, UA FEW (*)    All other components within normal limits  RESP PANEL BY RT-PCR (FLU A&B, COVID) ARPGX2  LACTIC ACID, PLASMA  BRAIN NATRIURETIC PEPTIDE  TSH  LACTIC ACID, PLASMA  TROPONIN I (HIGH SENSITIVITY)  TROPONIN I (HIGH SENSITIVITY)    EKG EKG Interpretation  Date/Time:  Monday August 06 2021 10:58:22 EST Ventricular Rate:  80 PR Interval:  168 QRS Duration: 88 QT Interval:  390 QTC Calculation: 449 R Axis:   52 Text Interpretation: Normal sinus rhythm Low voltage QRS Cannot rule out Anterior infarct , age undetermined Abnormal ECG When compared with ECG of 29-Jul-2020 12:31, PREVIOUS ECG IS PRESENT When compared to prior, similar appearance to prior. No STEMI Confirmed by Antony Blackbird [phone]) on 08/06/2021 4:06:44 PM  Radiology CT Angio Chest PE W and/or Wo Contrast  Result Date: 08/06/2021 CLINICAL DATA:  Worsening shortness of breath, known cancer, pleuritic chest discomfort. EXAM: CT ANGIOGRAPHY CHEST WITH CONTRAST TECHNIQUE: Multidetector CT imaging of the chest was performed using the standard protocol during bolus administration of intravenous contrast. Multiplanar CT image reconstructions and MIPs were obtained to evaluate the vascular anatomy. RADIATION DOSE REDUCTION: This exam was performed according to the departmental dose-optimization program which includes automated exposure control, adjustment of the mA and/or kV according to patient size and/or use of iterative reconstruction technique. CONTRAST:  29mL OMNIPAQUE IOHEXOL 350 MG/ML SOLN COMPARISON:  09/06/2019, 08/06/2021. FINDINGS: Cardiovascular: The heart is enlarged  and no pericardial effusion is seen. Scattered coronary artery calcifications are noted. There is atherosclerotic calcification of the aorta without evidence of aneurysm. The pulmonary trunk is normal in caliber. No large central pulmonary artery filling defect. Evaluation of the pulmonary arteries is limited due to respiratory motion artifact. Mediastinum/Nodes: The thyroid gland, trachea, and esophagus are within normal limits. Enlarged lymph nodes are present in the mediastinum and right hilum. No axillary lymphadenopathy. Lungs/Pleura: Multiple pulmonary nodules and masses are seen bilaterally, the largest measuring 3.1 cm in  the left lower lobe there is pleural thickening on the right with a small loculated pleural effusion and right lower lobe consolidation. No pneumothorax is seen bilaterally. Upper Abdomen: Visualized portion of the common bile duct is prominent in size at 1.2 cm. Musculoskeletal: There is bony deformity of the proximal sternum suggesting old healed fracture. Mixed lytic and sclerotic lesions are present throughout the bones. There are compression deformities at T1, T2, T3, T5, T6, T7, T8, T9, T10, T12, and L1, better evaluated on recent MRI. Kyphoplasty changes are present at T10, T8, and T7 Review of the MIP images confirms the above findings. IMPRESSION: 1. No large central pulmonary embolus is identified. Evaluation of the pulmonary arteries is limited due to respiratory motion artifact. The need for short-term repeat evaluation should be determined clinically. 2. Innumerable pulmonary nodules and masses bilaterally measuring up to 3.1 cm, suspicious for metastatic disease. There is also pleural thickening with a small loculated pleural effusion on the right. Consolidation is noted at the right lung base and the possibility of superimposed infection can not be excluded. 3. Multiple compression deformities, lytic and sclerotic lesions in the bones, and kyphoplasty changes in the thoracic  spine, better evaluated on recent MRI. 4. Cardiomegaly with scattered coronary artery calcifications. 5. Aortic atherosclerosis. 6. Dilatation of the common bile duct measuring up to 1.2 cm. Ultrasound is suggested for further evaluation. Electronically Signed   By: Brett Fairy M.D.   On: 08/06/2021 20:31   CT Cervical Spine Wo Contrast  Result Date: 08/06/2021 CLINICAL DATA:  Acute neck pain EXAM: CT CERVICAL SPINE WITHOUT CONTRAST TECHNIQUE: Multidetector CT imaging of the cervical spine was performed without intravenous contrast. Multiplanar CT image reconstructions were also generated. RADIATION DOSE REDUCTION: This exam was performed according to the departmental dose-optimization program which includes automated exposure control, adjustment of the mA and/or kV according to patient size and/or use of iterative reconstruction technique. COMPARISON:  CT neck 11/01/2020 FINDINGS: Alignment: No acute subluxation. Skull base and vertebrae: Bony destructive lesion again seen at the anterior left mastoid air cells with associated adjacent irregular soft tissue mass density. The soft tissue mass appears decreased in size since previous study. No acute vertebral body fracture identified. Diffuse heterogeneous lucent appearance of the vertebral bodies especially in the lower cervical and upper thoracic spine, suggesting metastatic disease. Associated apparent soft tissue density extending dorsally into the spinal canal from C4 through C7. Soft tissues and spinal canal: No prevertebral soft tissue edema. No evidence of visible canal hematoma. Disc levels: Mild intervertebral disc space narrowing throughout the cervical spine. Upper chest: Multiple metastatic pulmonary nodules are visualized in the upper lungs which are increased since previous study measuring up to 2 cm near the left apex. Prominent superior mediastinal lymph nodes. Other: None. IMPRESSION: 1. Extensive osseous metastatic disease especially in the  lower cervical and visualized upper thoracic vertebral bodies. Associated apparent soft tissue density extending dorsally into the spinal canal from C4 through C7, limited visualization with CT. Correlate clinically and consider follow-up MRI with contrast to evaluate degree of spinal canal stenoses and possible cord compression if indicated. 2. Multiple metastatic pulmonary nodules which are increased in size since previous study. 3. Redemonstration and slightly decreased size of an invasive irregular mass deep to the left parotid with destruction and invasion into the left mastoids as seen previously. Electronically Signed   By: Ofilia Neas M.D.   On: 08/06/2021 12:59   MR Cervical Spine W or Wo Contrast  Result Date: 08/06/2021  CLINICAL DATA:  Follow-up examination for metastatic disease. EXAM: MRI CERVICAL AND THORACIC SPINE WITHOUT AND WITH CONTRAST TECHNIQUE: Multiplanar and multiecho pulse sequences of the cervical spine, to include the craniocervical junction and cervicothoracic junction, and the thoracic spine, were obtained without and with intravenous contrast. CONTRAST:  82mL GADAVIST GADOBUTROL 1 MMOL/ML IV SOLN COMPARISON:  Prior CT from earlier the same day as well as previous MRI from 05/30/2020. FINDINGS: MRI CERVICAL SPINE FINDINGS Alignment: Examination moderately to severely degraded by motion artifact. Vertebral bodies normally aligned with preservation of the normal cervical lordosis. No listhesis. Vertebrae: Metastatic disease involving the upper thoracic spine noted, discussed on corresponding thoracic portion of this exam. Otherwise, signal intensity within the visualized bone marrow of the cervical spine is within normal limits. No metastatic involvement within the cervical spine itself. No other discrete or worrisome osseous lesions. No other abnormal marrow edema or enhancement. Vertebral body height maintained without acute or chronic fracture. Cord: Signal intensity within the  cervical spinal cord is grossly within normal limits. No appreciable cord signal abnormality or abnormal enhancement on this motion degraded exam. Posterior Fossa, vertebral arteries, paraspinal tissues: Probable chronic microvascular ischemic disease noted within the partially visualized pons and brainstem. Visualized brain and posterior fossa otherwise unremarkable. Craniocervical junction within normal limits. Paraspinous soft tissues within normal limits. Normal flow voids seen within the vertebral arteries bilaterally. Disc levels: Mild for age degenerative disc desiccation with minimal disc bulging throughout the cervical spine. No significant spinal stenosis or frank cord impingement. Foramina appear grossly patent. MRI THORACIC SPINE FINDINGS Alignment: Examination moderately to severely degraded by motion artifact. Vertebral bodies normally aligned with preservation of the normal thoracic kyphosis. Trace anterolisthesis of T9 on T10, with trace retrolisthesis of T10 on T11 noted. Vertebrae: Abnormal T1 hypointense, stir hyperintense enhancing lesions seen involving the T1 and T2 vertebral bodies, consistent with metastatic disease. Associated mild height loss at these levels consistent with associated pathologic fractures, most pronounced at the T2 level where height loss measures up to approximately 30-40%. Epidural extension with tumor seen involving the right greater than left ventral epidural space at this level (series 21, image 14). Mild flattening and mass effect on the thoracic cord posteriorly with up to moderate spinal stenosis. No visible cord signal changes on this motion degraded exam. Lateral extension to involve the T1-2 neural foramina bilaterally which are largely obliterated. Additional extension into the left greater than right T2-3 neural foramina with mild to moderate bilateral foraminal stenosis. Additional extension to involve the posterior elements including the bilateral pedicles  and posterior facets noted as well. Additional metastatic lesion involving the T6 vertebral body seen as well. Associated pathologic compression fracture with up to 50% height loss. Epidural extension of tumor with small volume tumor seen extending into the ventral epidural space (series 21, image 12). No more than mild spinal stenosis at this level. No other metastatic disease seen within the thoracic spine. Late subacute to chronic compression fracture involving the superior endplate of Y30 with mild 30% height loss and 3 mm bony retropulsion noted. Multiple additional chronic compression fractures noted elsewhere throughout the thoracic spine, involving the T3, T5, T7, T8, and T10 vertebral bodies. Additional chronic compression fractures involving the upper lumbar spine at L1 through L3 noted. Sequelae of prior vertebral augmentation present at T7, T8, and T10. Cord: Signal intensity within the thoracic spinal cord is within normal limits. No appreciable cord signal changes on this motion degraded exam. Paraspinal and other soft tissues: Moderate right  with small left layering pleural effusions. Innumerable pulmonary nodules seen throughout the visualized lungs, consistent with metastatic disease. Disc levels: Multilevel degenerative disc bulging seen throughout the thoracic spine extending from C5-6 through the visualized upper lumbar spine. Superimposed multilevel facet arthropathy. No other high-grade spinal stenosis. Neural foramina otherwise appear to be largely patent. IMPRESSION: 1. Motion degraded exam. 2. 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 Epidural extension with tumor extending into the ventral epidural space at these levels, resulting in up to moderate spinal stenosis and cord flattening. No visible cord signal changes on this motion degraded exam. 3. Additional metastatic lesion involving the T6 vertebral body with associated  pathologic fracture and up to 50% height loss. Small volume ventral epidural extension of tumor with no more than mild spinal stenosis at this level. 4. No evidence for metastatic disease within the cervical spine. 5. Innumerable pulmonary nodules throughout the visualized lungs, consistent with metastatic disease. 6. Late subacute to chronic compression fracture of the superior endplate of Q22 with mild 30% height loss and 3 mm bony retropulsion. Multiple additional chronic compression fractures as above. 7. Small to moderate bilateral pleural effusions, greater on the right. 8. 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 No other high-grade spinal stenosis or impingement. Electronically Signed   By: Jeannine Boga M.D.   On: 08/06/2021 19:22   MR THORACIC SPINE W WO CONTRAST  Result Date: 08/06/2021 CLINICAL DATA:  Follow-up examination for metastatic disease. EXAM: MRI CERVICAL AND THORACIC SPINE WITHOUT AND WITH CONTRAST TECHNIQUE: Multiplanar and multiecho pulse sequences of the cervical spine, to include the craniocervical junction and cervicothoracic junction, and the thoracic spine, were obtained without and with intravenous contrast. CONTRAST:  50mL GADAVIST GADOBUTROL 1 MMOL/ML IV SOLN COMPARISON:  Prior CT from earlier the same day as well as previous MRI from 05/30/2020. FINDINGS: MRI CERVICAL SPINE FINDINGS Alignment: Examination moderately to severely degraded by motion artifact. Vertebral bodies normally aligned with preservation of the normal cervical lordosis. No listhesis. Vertebrae: Metastatic disease involving the upper thoracic spine noted, discussed on corresponding thoracic portion of this exam. Otherwise, signal intensity within the visualized bone marrow of the cervical spine is within normal limits. No metastatic involvement within the cervical spine itself. No other discrete or worrisome osseous lesions. No other  abnormal marrow edema or enhancement. Vertebral body height maintained without acute or chronic fracture. Cord: Signal intensity within the cervical spinal cord is grossly within normal limits. No appreciable cord signal abnormality or abnormal enhancement on this motion degraded exam. Posterior Fossa, vertebral arteries, paraspinal tissues: Probable chronic microvascular ischemic disease noted within the partially visualized pons and brainstem. Visualized brain and posterior fossa otherwise unremarkable. Craniocervical junction within normal limits. Paraspinous soft tissues within normal limits. Normal flow voids seen within the vertebral arteries bilaterally. Disc levels: Mild for age degenerative disc desiccation with minimal disc bulging throughout the cervical spine. No significant spinal stenosis or frank cord impingement. Foramina appear grossly patent. MRI THORACIC SPINE FINDINGS Alignment: Examination moderately to severely degraded by motion artifact. Vertebral bodies normally aligned with preservation of the normal thoracic kyphosis. Trace anterolisthesis of T9 on T10, with trace retrolisthesis of T10 on T11 noted. Vertebrae: Abnormal T1 hypointense, stir hyperintense enhancing lesions seen involving the T1 and T2 vertebral bodies, consistent with metastatic disease. Associated mild height loss at these levels consistent with associated pathologic fractures, most pronounced at the T2 level where height loss measures up to approximately  30-40%. Epidural extension with tumor seen involving the right greater than left ventral epidural space at this level (series 21, image 14). Mild flattening and mass effect on the thoracic cord posteriorly with up to moderate spinal stenosis. No visible cord signal changes on this motion degraded exam. Lateral extension to involve the T1-2 neural foramina bilaterally which are largely obliterated. Additional extension into the left greater than right T2-3 neural foramina  with mild to moderate bilateral foraminal stenosis. Additional extension to involve the posterior elements including the bilateral pedicles and posterior facets noted as well. Additional metastatic lesion involving the T6 vertebral body seen as well. Associated pathologic compression fracture with up to 50% height loss. Epidural extension of tumor with small volume tumor seen extending into the ventral epidural space (series 21, image 12). No more than mild spinal stenosis at this level. No other metastatic disease seen within the thoracic spine. Late subacute to chronic compression fracture involving the superior endplate of F09 with mild 30% height loss and 3 mm bony retropulsion noted. Multiple additional chronic compression fractures noted elsewhere throughout the thoracic spine, involving the T3, T5, T7, T8, and T10 vertebral bodies. Additional chronic compression fractures involving the upper lumbar spine at L1 through L3 noted. Sequelae of prior vertebral augmentation present at T7, T8, and T10. Cord: Signal intensity within the thoracic spinal cord is within normal limits. No appreciable cord signal changes on this motion degraded exam. Paraspinal and other soft tissues: Moderate right with small left layering pleural effusions. Innumerable pulmonary nodules seen throughout the visualized lungs, consistent with metastatic disease. Disc levels: Multilevel degenerative disc bulging seen throughout the thoracic spine extending from C5-6 through the visualized upper lumbar spine. Superimposed multilevel facet arthropathy. No other high-grade spinal stenosis. Neural foramina otherwise appear to be largely patent. IMPRESSION: 1. Motion degraded exam. 2. 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 Epidural extension with tumor extending into the ventral epidural space at these levels, resulting in up to moderate spinal stenosis and cord flattening. No  visible cord signal changes on this motion degraded exam. 3. Additional metastatic lesion involving the T6 vertebral body with associated pathologic fracture and up to 50% height loss. Small volume ventral epidural extension of tumor with no more than mild spinal stenosis at this level. 4. No evidence for metastatic disease within the cervical spine. 5. Innumerable pulmonary nodules throughout the visualized lungs, consistent with metastatic disease. 6. Late subacute to chronic compression fracture of the superior endplate of N23 with mild 30% height loss and 3 mm bony retropulsion. Multiple additional chronic compression fractures as above. 7. Small to moderate bilateral pleural effusions, greater on the right. 8. 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 No other high-grade spinal stenosis or impingement. Electronically Signed   By: Jeannine Boga M.D.   On: 08/06/2021 19:22   DG Femur Min 2 Views Left  Result Date: 08/06/2021 CLINICAL DATA:  Left lower extremity muscle spasms. EXAM: LEFT FEMUR 2 VIEWS COMPARISON:  None. FINDINGS: There is no evidence of fracture or other focal bone lesions. A small joint effusion is seen within the left knee. IMPRESSION: 1. No acute osseous abnormality. 2. Small left knee joint effusion. Electronically Signed   By: Virgina Norfolk M.D.   On: 08/06/2021 20:23    Procedures Procedures    Medications Ordered in ED Medications  acetaminophen (TYLENOL) tablet 650 mg (has no administration in time range)  HYDROmorphone (DILAUDID) injection 1  mg (has no administration in time range)  oxyCODONE (Oxy IR/ROXICODONE) immediate release tablet 10 mg (has no administration in time range)  HYDROcodone-acetaminophen (NORCO/VICODIN) 5-325 MG per tablet 1 tablet (1 tablet Oral Given 08/06/21 1152)  HYDROmorphone (DILAUDID) injection 1 mg (1 mg Intravenous Given 08/06/21 1625)  gadobutrol (GADAVIST) 1 MMOL/ML  injection 10 mL (10 mLs Intravenous Contrast Given 08/06/21 1756)  gadobutrol (GADAVIST) 1 MMOL/ML injection 10 mL (10 mLs Intravenous Contrast Given 08/06/21 1801)  HYDROmorphone (DILAUDID) injection 1 mg (1 mg Intravenous Given 08/06/21 1922)  iohexol (OMNIPAQUE) 350 MG/ML injection 98 mL (98 mLs Intravenous Contrast Given 08/06/21 1959)  fentaNYL (SUBLIMAZE) injection 50 mcg (50 mcg Intravenous Given 08/06/21 2043)    ED Course/ Medical Decision Making/ A&P                           Medical Decision Making  Theresa Fernandez is a 66 y.o. female with a past medical history significant for hypertension, diabetes, dyslipidemia, CKD, CHF on 3 L home oxygen, COPD, and parotid cancer with lung metastasis who presents with several complaints including worsened upper back and neck pain, new and then worsening left arm numbness and weakness with pain, worsened peripheral edema and pleuritic chest pain and shortness of breath.  According to patient, she has been having symptoms of neck and back pain for the last few months but over the last week or so they have been acutely worsening.  She describes the pain as 10 out of 10 going from her neck and back down her left arm and a shooting and tingling fashion.  She reports that in the just the last few days she has had more numbness and weakness in her left arm and cannot use it as well.  She is right-handed.  She denies any new trauma.  She reports that she is having pleuritic chest pain across her chest that feels like tightness and pressure as well as shortness of breath.  She feels that she is getting swollen in her legs more so than baseline as well as her face.  She denies fevers, chills, congestion, cough, nausea, vomiting, constipation, diarrhea, or urinary symptoms.  She is denying any new numbness or weakness in her legs and denies any urinary or stool incontinence.  She reports that she is taking her Lasix as directed but has been urinating less today and  also reports that she is not taking her pain medicine as directed with oxycodone.  On exam, patient's legs are edematous.  She is having tenderness in her left mid to distal thigh.  No tenderness more distally.  Intact sensation in the legs and good pulses.  She had some symmetric mild weakness that is at his baseline.  Abdomen was nontender.  Normal bowel sounds.  Lungs had rales bilaterally.  Chest was nontender but when she took a deep breath it did hurt.  Patient was exquisitely tender in her mid to upper thoracic spine and cervical spine.  No carotid bruit appreciated.  She had decreased grip strength in the left compared to the right but Hoffmann sign was negative on both hands.  She has decreased sensation in the hyperthenar side of her forearm on the left.  Intact pulses.  Patient has right-sided facial droop which is unchanged from baseline.  Pupils are symmetric and reactive with normal extraocular movements.  Clear speech.  Patient had CT in triage for the back spasms and neck pains.  The CT  shows concern for invasive tissue suspected to be metastasis and cancer in her cervical spine and thoracic spine that they can see that appears to be pressing on her spinal cord.  Given the new and worsening pain, numbness, and weakness in the left arm, I do feel she needs imaging as recommended by the CT scan.  We will get MRI with and without of the C and T-spine.  For her worsening edema and pleuritic chest pain or shortness of breath, will get troponin and BNP to look for heart failure exacerbation but will also get PE study given her cancer history and the pleuritic pain that is new.  We will get other screening labs and will give the patient some pain medicine.  Anticipate reassessment after work-up to determine disposition.  Work-up has continued to return.  No evidence of pulmonary embolism seen but she does have numerous nodules.  Also showed possible consolidation versus atelectasis but patient  denies new cough fevers or chills.  She does not think she has an infection so we will hold on antibiotics at this time.  More concerning is the patient's MRI of her neck and back that shows multiple pathologic fractures and some extension of the metastasis into the canal causing some moderate and mild cord compression.  No signal changes seen at this time but I do suspect that these are causing her arm and pain symptoms.  9:35 PM I spoke to Dr. Mable Fill with [facility] who reviewed the case and agrees that patient needs admission.  He will have Dr. Laurena Bering team see the patient in the morning to further evaluate the pathologic fractures and encroachment on the cord causing compression and likely her neurologic deficits.  They request that she be made n.p.o. at midnight.  Medicine will admit for further management of uncontrolled pain with pathologic fractures.         Final Clinical Impression(s) / ED Diagnoses Final diagnoses:  Neck pain  Acute thoracic back pain, unspecified back pain laterality     Clinical Impression: 1. Neck pain   2. Acute thoracic back pain, unspecified back pain laterality   3. Dilation of biliary tract     Disposition: Admit  This note was prepared with assistance of Dragon voice recognition software. Occasional wrong-word or sound-a-like substitutions may have occurred due to the inherent limitations of voice recognition software.      Ceazia Harb, Gwenyth Allegra, MD 08/06/21 2146

## 2021-08-06 NOTE — ED Notes (Signed)
Patient transported to CT 

## 2021-08-06 NOTE — H&P (Signed)
History and Physical    Theresa Fernandez TTS:[id number] DOB: 26-Oct-1955 DOA: 08/06/2021  PCP: [facility] [place]   Patient coming from: home   Chief Complaint: Severe pain in upper back, left arm numbness and weakness  HPI: Theresa Fernandez is a pleasant 66 y.o. 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  Patient reports that she has been struggling with severe pain in her upper back on the left side, as well as neck for the past 2 months, was seen by an orthopedic surgeon and plan to get outpatient MRI but now has unbearable pain despite Percocet, baclofen, and Lyrica.  She also has worsening numbness and weakness in her left arm and complains of increasing shortness of breath, bilateral leg swelling, and orthopnea.  She denies fever, chills, cough, or wheezing.  She has been taking Lasix daily.  ED Course: Upon arrival to the ED, patient is found to be afebrile, saturating well on 4 L/min of supplemental oxygen, and with stable blood pressure.  EKG features sinus rhythm.  Extensive imaging was performed in the emergency department and demonstrates dilated CBD, increased metastatic pulmonary nodules, and extensive osseous metastatic disease with soft tissue density extending dorsally into the spinal canal, pathologic compression fractures, and spinal stenosis.  She was given multiple doses of pain medications in the ED and orthopedic surgery was consulted by the ED physician.  Review of Systems:  All other systems reviewed and apart from HPI, are negative.  Past Medical History:  Diagnosis Date   Allergy    Anxiety    Arthritis    CHF (congestive heart failure) (HCC)    COPD (chronic obstructive  pulmonary disease) ([place])    Depression    Diabetes mellitus without complication ([place])    HTN (hypertension) 01/12/2013   pt denies    Past Surgical History:  Procedure Laterality Date   BIOPSY  08/01/2020   Procedure: BIOPSY;  Surgeon: Milus Banister, MD;  Location: [facility] ENDOSCOPY;  Service: Endoscopy;;   ESOPHAGOGASTRODUODENOSCOPY (EGD) WITH PROPOFOL N/A 08/01/2020   Procedure: ESOPHAGOGASTRODUODENOSCOPY (EGD) WITH PROPOFOL;  Surgeon: Milus Banister, MD;  Location: [facility] ENDOSCOPY;  Service: Endoscopy;  Laterality: N/A;   MASTOIDECTOMY     TONSILLECTOMY     TUBAL LIGATION      Social History:   reports that she quit smoking about 3 years ago. Her smoking use included cigarettes. She has a 150.00 pack-year smoking history. She has never used smokeless tobacco. She reports that she does not drink alcohol and does not use drugs.  Allergies  Allergen Reactions   Ceftriaxone Hives and Rash    After second dose on 06/07/2018,   Topamax [Topiramate] Other (See Comments)    Made headache worse   Capsaicin Rash   Voltaren [Diclofenac Sodium] Other (See Comments)    Caused migraine    Family History  Problem Relation Age of Onset   Diabetes Mother    Alcohol abuse Father    Diabetes Father    Stroke Brother      Prior to Admission medications   Medication Sig Start Date End Date Taking? Authorizing Provider  albuterol (ACCUNEB) 0.63 MG/3ML nebulizer solution Take 3 mLs (0.63 mg total) by nebulization every 4 (four) hours as needed for wheezing or shortness of breath.  07/17/20  Yes Icard, Bradley L, DO  albuterol (VENTOLIN HFA) 108 (90 Base) MCG/ACT inhaler Inhale 2 puffs into the lungs every 6 (six) hours as needed for wheezing or shortness of breath (wheezing). 07/17/20  Yes Icard, Octavio Graves, DO  ALPRAZolam (XANAX) 0.5 MG tablet Take 0.5 mg by mouth 2 (two) times daily as needed for anxiety. 11/28/19  Yes [provider]  baclofen (LIORESAL) 10 MG tablet Take 10 mg by mouth  3 (three) times daily as needed for muscle spasms. 07/28/21  Yes [provider]  Budeson-Glycopyrrol-Formoterol (BREZTRI AEROSPHERE) 160-9-4.8 MCG/ACT AERO Inhale 2 puffs into the lungs in the morning and at bedtime. 05/17/21  Yes Icard, Bradley L, DO  busPIRone (BUSPAR) 15 MG tablet Take 30 mg by mouth 2 (two) times daily.   Yes [provider]  furosemide (LASIX) 40 MG tablet Take 1 tablet (40 mg total) by mouth 2 (two) times daily. Patient taking differently: Take 40 mg by mouth daily as needed for fluid or edema. 10/19/18  Yes Hilty, Nadean Corwin, MD  hydrOXYzine (ATARAX/VISTARIL) 25 MG tablet Take 25 mg by mouth daily. 04/21/18  Yes [provider]  lamoTRIgine (LAMICTAL) 25 MG tablet Take 25 mg by mouth at bedtime.  04/21/18  Yes [provider]  lovastatin (MEVACOR) 40 MG tablet Take 80 mg by mouth daily. 03/18/20  Yes [provider]  naloxone (NARCAN) nasal spray 4 mg/0.1 mL Place 4 mg into the nose as needed for opioid reversal. 04/07/21  Yes [provider]  oxyCODONE-acetaminophen (PERCOCET) 10-325 MG tablet Take 1 tablet by mouth See admin instructions. Every 4 to 6 hours as needed for pain 08/01/21  Yes [provider]  pantoprazole (PROTONIX) 40 MG tablet Take 1 tablet (40 mg total) by mouth 2 (two) times daily. 08/01/20 09/29/21 Yes Swayze, Ava, DO  pioglitazone (ACTOS) 15 MG tablet Take 15 mg by mouth daily. 09/13/19  Yes [provider]  prazosin (MINIPRESS) 5 MG capsule Take 5 mg by mouth at bedtime. 12/04/19  Yes [provider]  pregabalin (LYRICA) 150 MG capsule Take 1 capsule (150 mg total) by mouth at bedtime. Patient taking differently: Take 100 mg by mouth 2 (two) times daily. 02/11/13  Yes Johnson, Clanford L, MD  pregabalin (LYRICA) 150 MG capsule Take 1 capsule by mouth 2 (two) times daily. 08/15/20  Yes [provider]  sertraline (ZOLOFT) 100 MG tablet Take 200 mg by mouth in the morning and at  bedtime.   Yes [provider]  Tiotropium Bromide-Olodaterol (STIOLTO RESPIMAT) 2.5-2.5 MCG/ACT AERS Inhale 2 puffs into the lungs daily. Patient taking differently: Inhale 2 puffs into the lungs in the morning and at bedtime. 07/17/20  Yes Icard, Octavio Graves, DO  valACYclovir (VALTREX) 1000 MG tablet Take 1,000 mg by mouth 2 (two) times daily. 07/28/21  Yes [provider]  budesonide-formoterol (SYMBICORT) 80-4.5 MCG/ACT inhaler Inhale 2 puffs into the lungs 2 (two) times daily. Patient not taking: No sig reported 07/20/18 11/23/19  Martyn Ehrich, NP    Physical Exam: Vitals:   08/06/21 2010 08/06/21 2045 08/06/21 2100 08/06/21 2200  BP: (!) 117/57 125/65 110/88 109/73  Pulse: 88 82 80 86  Resp: 20 (!) 24 (!) 27 (!) 23  Temp:      TempSrc:      SpO2: 95% 98% 96% 95%    Constitutional: NAD, calm  Eyes: PERTLA, lids and conjunctivae normal ENMT: Mucous membranes are moist. Posterior pharynx clear of any exudate or  lesions.   Neck: supple, no masses  Respiratory: no wheezing. No accessory muscle use.  Cardiovascular: S1 & S2 heard, regular rate and rhythm. Pretibial pitting edema. Abdomen: No distension, soft. Bowel sounds active.  Musculoskeletal: no clubbing / cyanosis. No joint deformity upper and lower extremities.   Skin: no significant rashes, lesions, ulcers. Warm, dry, well-perfused. Neurologic: Left lower facial weakness. Sensation to light touch and strength diminished in LUE. Alert and oriented.  Psychiatric: Pleasant. Cooperative.    Labs and Imaging on Admission: I have personally reviewed following labs and imaging studies  CBC: Recent Labs  Lab 08/06/21 1630  WBC 9.5  NEUTROABS 6.8  HGB 11.6*  HCT 38.3  MCV 100.3*  PLT 696   Basic Metabolic Panel: Recent Labs  Lab 08/06/21 1630  NA 140  K 4.0  CL 95*  CO2 34*  GLUCOSE 99  BUN 15  CREATININE 1.07*  CALCIUM 9.1   GFR: CrCl cannot be calculated (Unknown ideal weight.). Liver  Function Tests: Recent Labs  Lab 08/06/21 1630  AST 19  ALT 15  ALKPHOS 90  BILITOT 0.5  PROT 7.0  ALBUMIN 3.2*   No results for input(s): LIPASE, AMYLASE in the last 168 hours. No results for input(s): AMMONIA in the last 168 hours. Coagulation Profile: No results for input(s): INR, PROTIME in the last 168 hours. Cardiac Enzymes: No results for input(s): CKTOTAL, CKMB, CKMBINDEX, TROPONINI in the last 168 hours. BNP (last 3 results) No results for input(s): PROBNP in the last 8760 hours. HbA1C: No results for input(s): HGBA1C in the last 72 hours. CBG: No results for input(s): GLUCAP in the last 168 hours. Lipid Profile: No results for input(s): CHOL, HDL, LDLCALC, TRIG, CHOLHDL, LDLDIRECT in the last 72 hours. Thyroid Function Tests: Recent Labs    08/06/21 1630  TSH 1.306   Anemia Panel: No results for input(s): VITAMINB12, FOLATE, FERRITIN, TIBC, IRON, RETICCTPCT in the last 72 hours. Urine analysis:    Component Value Date/Time   COLORURINE YELLOW 08/06/2021 2025   APPEARANCEUR CLOUDY (A) 08/06/2021 2025   LABSPEC 1.015 08/06/2021 2025   PHURINE 8.0 08/06/2021 2025   GLUCOSEU NEGATIVE 08/06/2021 2025   HGBUR NEGATIVE 08/06/2021 2025   BILIRUBINUR NEGATIVE 08/06/2021 2025   KETONESUR NEGATIVE 08/06/2021 2025   PROTEINUR NEGATIVE 08/06/2021 2025   UROBILINOGEN 0.2 03/06/2015 1205   NITRITE NEGATIVE 08/06/2021 2025   LEUKOCYTESUR LARGE (A) 08/06/2021 2025   Sepsis Labs: @LABRCNTIP (procalcitonin:4,lacticidven:4) ) Recent Results (from the past 240 hour(s))  Resp Panel by RT-PCR (Flu A&B, Covid) Nasopharyngeal Swab     Status: None   Collection Time: 08/06/21  4:06 PM   Specimen: Nasopharyngeal Swab; Nasopharyngeal(NP) swabs in vial transport medium  Result Value Ref Range Status   SARS Coronavirus 2 by RT PCR NEGATIVE NEGATIVE Final    Comment: (NOTE) SARS-CoV-2 target nucleic acids are NOT DETECTED.  The SARS-CoV-2 RNA is generally detectable in upper  respiratory specimens during the acute phase of infection. The lowest concentration of SARS-CoV-2 viral copies this assay can detect is 138 copies/mL. A negative result does not preclude SARS-Cov-2 infection and should not be used as the sole basis for treatment or other patient management decisions. A negative result may occur with  improper specimen collection/handling, submission of specimen other than nasopharyngeal swab, presence of viral mutation(s) within the areas targeted by this assay, and inadequate number of viral copies(<138 copies/mL). A negative result must be combined with clinical observations, patient history, and epidemiological information. The expected result is  Negative.  Fact Sheet for Patients:  EntrepreneurPulse.com.au  Fact Sheet for Healthcare Providers:  IncredibleEmployment.be  This test is no t yet approved or cleared by the [place] FDA and  has been authorized for detection and/or diagnosis of SARS-CoV-2 by FDA under an Emergency Use Authorization (EUA). This EUA will remain  in effect (meaning this test can be used) for the duration of the COVID-19 declaration under Section 564(b)(1) of the Act, 21 U.S.C.section 360bbb-3(b)(1), unless the authorization is terminated  or revoked sooner.       Influenza A by PCR NEGATIVE NEGATIVE Final   Influenza B by PCR NEGATIVE NEGATIVE Final    Comment: (NOTE) The Xpert Xpress SARS-CoV-2/FLU/RSV plus assay is intended as an aid in the diagnosis of influenza from Nasopharyngeal swab specimens and should not be used as a sole basis for treatment. Nasal washings and aspirates are unacceptable for Xpert Xpress SARS-CoV-2/FLU/RSV testing.  Fact Sheet for Patients: EntrepreneurPulse.com.au  Fact Sheet for Healthcare Providers: IncredibleEmployment.be  This test is not yet approved or cleared by the [place] FDA and has been  authorized for detection and/or diagnosis of SARS-CoV-2 by FDA under an Emergency Use Authorization (EUA). This EUA will remain in effect (meaning this test can be used) for the duration of the COVID-19 declaration under Section 564(b)(1) of the Act, 21 U.S.C. section 360bbb-3(b)(1), unless the authorization is terminated or revoked.  Performed at [facility], [place] [address] [place], [place] [address]      Radiological Exams on Admission: CT Angio Chest PE W and/or Wo Contrast  Result Date: 08/06/2021 CLINICAL DATA:  Worsening shortness of breath, known cancer, pleuritic chest discomfort. EXAM: CT ANGIOGRAPHY CHEST WITH CONTRAST TECHNIQUE: Multidetector CT imaging of the chest was performed using the standard protocol during bolus administration of intravenous contrast. Multiplanar CT image reconstructions and MIPs were obtained to evaluate the vascular anatomy. RADIATION DOSE REDUCTION: This exam was performed according to the departmental dose-optimization program which includes automated exposure control, adjustment of the mA and/or kV according to patient size and/or use of iterative reconstruction technique. CONTRAST:  9mL OMNIPAQUE IOHEXOL 350 MG/ML SOLN COMPARISON:  09/06/2019, 08/06/2021. FINDINGS: Cardiovascular: The heart is enlarged and no pericardial effusion is seen. Scattered coronary artery calcifications are noted. There is atherosclerotic calcification of the aorta without evidence of aneurysm. The pulmonary trunk is normal in caliber. No large central pulmonary artery filling defect. Evaluation of the pulmonary arteries is limited due to respiratory motion artifact. Mediastinum/Nodes: The thyroid gland, trachea, and esophagus are within normal limits. Enlarged lymph nodes are present in the mediastinum and right hilum. No axillary lymphadenopathy. Lungs/Pleura: Multiple pulmonary nodules and masses are seen bilaterally, the largest measuring 3.1 cm in the left lower lobe  there is pleural thickening on the right with a small loculated pleural effusion and right lower lobe consolidation. No pneumothorax is seen bilaterally. Upper Abdomen: Visualized portion of the common bile duct is prominent in size at 1.2 cm. Musculoskeletal: There is bony deformity of the proximal sternum suggesting old healed fracture. Mixed lytic and sclerotic lesions are present throughout the bones. There are compression deformities at T1, T2, T3, T5, T6, T7, T8, T9, T10, T12, and L1, better evaluated on recent MRI. Kyphoplasty changes are present at T10, T8, and T7 Review of the MIP images confirms the above findings. IMPRESSION: 1. No large central pulmonary embolus is identified. Evaluation of the pulmonary arteries is limited due to respiratory motion artifact. The need for short-term repeat evaluation should  be determined clinically. 2. Innumerable pulmonary nodules and masses bilaterally measuring up to 3.1 cm, suspicious for metastatic disease. There is also pleural thickening with a small loculated pleural effusion on the right. Consolidation is noted at the right lung base and the possibility of superimposed infection can not be excluded. 3. Multiple compression deformities, lytic and sclerotic lesions in the bones, and kyphoplasty changes in the thoracic spine, better evaluated on recent MRI. 4. Cardiomegaly with scattered coronary artery calcifications. 5. Aortic atherosclerosis. 6. Dilatation of the common bile duct measuring up to 1.2 cm. Ultrasound is suggested for further evaluation. Electronically Signed   By: Brett Fairy M.D.   On: 08/06/2021 20:31   CT Cervical Spine Wo Contrast  Result Date: 08/06/2021 CLINICAL DATA:  Acute neck pain EXAM: CT CERVICAL SPINE WITHOUT CONTRAST TECHNIQUE: Multidetector CT imaging of the cervical spine was performed without intravenous contrast. Multiplanar CT image reconstructions were also generated. RADIATION DOSE REDUCTION: This exam was performed  according to the departmental dose-optimization program which includes automated exposure control, adjustment of the mA and/or kV according to patient size and/or use of iterative reconstruction technique. COMPARISON:  CT neck 11/01/2020 FINDINGS: Alignment: No acute subluxation. Skull base and vertebrae: Bony destructive lesion again seen at the anterior left mastoid air cells with associated adjacent irregular soft tissue mass density. The soft tissue mass appears decreased in size since previous study. No acute vertebral body fracture identified. Diffuse heterogeneous lucent appearance of the vertebral bodies especially in the lower cervical and upper thoracic spine, suggesting metastatic disease. Associated apparent soft tissue density extending dorsally into the spinal canal from C4 through C7. Soft tissues and spinal canal: No prevertebral soft tissue edema. No evidence of visible canal hematoma. Disc levels: Mild intervertebral disc space narrowing throughout the cervical spine. Upper chest: Multiple metastatic pulmonary nodules are visualized in the upper lungs which are increased since previous study measuring up to 2 cm near the left apex. Prominent superior mediastinal lymph nodes. Other: None. IMPRESSION: 1. Extensive osseous metastatic disease especially in the lower cervical and visualized upper thoracic vertebral bodies. Associated apparent soft tissue density extending dorsally into the spinal canal from C4 through C7, limited visualization with CT. Correlate clinically and consider follow-up MRI with contrast to evaluate degree of spinal canal stenoses and possible cord compression if indicated. 2. Multiple metastatic pulmonary nodules which are increased in size since previous study. 3. Redemonstration and slightly decreased size of an invasive irregular mass deep to the left parotid with destruction and invasion into the left mastoids as seen previously. Electronically Signed   By: Ofilia Neas M.D.   On: 08/06/2021 12:59   MR Cervical Spine W or Wo Contrast  Result Date: 08/06/2021 CLINICAL DATA:  Follow-up examination for metastatic disease. EXAM: MRI CERVICAL AND THORACIC SPINE WITHOUT AND WITH CONTRAST TECHNIQUE: Multiplanar and multiecho pulse sequences of the cervical spine, to include the craniocervical junction and cervicothoracic junction, and the thoracic spine, were obtained without and with intravenous contrast. CONTRAST:  54mL GADAVIST GADOBUTROL 1 MMOL/ML IV SOLN COMPARISON:  Prior CT from earlier the same day as well as previous MRI from 05/30/2020. FINDINGS: MRI CERVICAL SPINE FINDINGS Alignment: Examination moderately to severely degraded by motion artifact. Vertebral bodies normally aligned with preservation of the normal cervical lordosis. No listhesis. Vertebrae: Metastatic disease involving the upper thoracic spine noted, discussed on corresponding thoracic portion of this exam. Otherwise, signal intensity within the visualized bone marrow of the cervical spine is within normal limits. No metastatic  involvement within the cervical spine itself. No other discrete or worrisome osseous lesions. No other abnormal marrow edema or enhancement. Vertebral body height maintained without acute or chronic fracture. Cord: Signal intensity within the cervical spinal cord is grossly within normal limits. No appreciable cord signal abnormality or abnormal enhancement on this motion degraded exam. Posterior Fossa, vertebral arteries, paraspinal tissues: Probable chronic microvascular ischemic disease noted within the partially visualized pons and brainstem. Visualized brain and posterior fossa otherwise unremarkable. Craniocervical junction within normal limits. Paraspinous soft tissues within normal limits. Normal flow voids seen within the vertebral arteries bilaterally. Disc levels: Mild for age degenerative disc desiccation with minimal disc bulging throughout the cervical spine. No  significant spinal stenosis or frank cord impingement. Foramina appear grossly patent. MRI THORACIC SPINE FINDINGS Alignment: Examination moderately to severely degraded by motion artifact. Vertebral bodies normally aligned with preservation of the normal thoracic kyphosis. Trace anterolisthesis of T9 on T10, with trace retrolisthesis of T10 on T11 noted. Vertebrae: Abnormal T1 hypointense, stir hyperintense enhancing lesions seen involving the T1 and T2 vertebral bodies, consistent with metastatic disease. Associated mild height loss at these levels consistent with associated pathologic fractures, most pronounced at the T2 level where height loss measures up to approximately 30-40%. Epidural extension with tumor seen involving the right greater than left ventral epidural space at this level (series 21, image 14). Mild flattening and mass effect on the thoracic cord posteriorly with up to moderate spinal stenosis. No visible cord signal changes on this motion degraded exam. Lateral extension to involve the T1-2 neural foramina bilaterally which are largely obliterated. Additional extension into the left greater than right T2-3 neural foramina with mild to moderate bilateral foraminal stenosis. Additional extension to involve the posterior elements including the bilateral pedicles and posterior facets noted as well. Additional metastatic lesion involving the T6 vertebral body seen as well. Associated pathologic compression fracture with up to 50% height loss. Epidural extension of tumor with small volume tumor seen extending into the ventral epidural space (series 21, image 12). No more than mild spinal stenosis at this level. No other metastatic disease seen within the thoracic spine. Late subacute to chronic compression fracture involving the superior endplate of F81 with mild 30% height loss and 3 mm bony retropulsion noted. Multiple additional chronic compression fractures noted elsewhere throughout the thoracic  spine, involving the T3, T5, T7, T8, and T10 vertebral bodies. Additional chronic compression fractures involving the upper lumbar spine at L1 through L3 noted. Sequelae of prior vertebral augmentation present at T7, T8, and T10. Cord: Signal intensity within the thoracic spinal cord is within normal limits. No appreciable cord signal changes on this motion degraded exam. Paraspinal and other soft tissues: Moderate right with small left layering pleural effusions. Innumerable pulmonary nodules seen throughout the visualized lungs, consistent with metastatic disease. Disc levels: Multilevel degenerative disc bulging seen throughout the thoracic spine extending from C5-6 through the visualized upper lumbar spine. Superimposed multilevel facet arthropathy. No other high-grade spinal stenosis. Neural foramina otherwise appear to be largely patent. IMPRESSION: 1. Motion degraded exam. 2. 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 Epidural extension with tumor extending into the ventral epidural space at these levels, resulting in up to moderate spinal stenosis and cord flattening. No visible cord signal changes on this motion degraded exam. 3. Additional metastatic lesion involving the T6 vertebral body with associated pathologic fracture and up to 50% height loss. Small volume ventral epidural extension of tumor  with no more than mild spinal stenosis at this level. 4. No evidence for metastatic disease within the cervical spine. 5. Innumerable pulmonary nodules throughout the visualized lungs, consistent with metastatic disease. 6. Late subacute to chronic compression fracture of the superior endplate of J67 with mild 30% height loss and 3 mm bony retropulsion. Multiple additional chronic compression fractures as above. 7. Small to moderate bilateral pleural effusions, greater on the right. 8. Underlying multilevel degenerative spondylosis and facet  degeneration throughout the cervicothoracic spine. No other high-grade spinal stenosis or impingement. Electronically Signed   By: Jeannine Boga M.D.   On: 08/06/2021 19:22   MR THORACIC SPINE W WO CONTRAST  Result Date: 08/06/2021 CLINICAL DATA:  Follow-up examination for metastatic disease. EXAM: MRI CERVICAL AND THORACIC SPINE WITHOUT AND WITH CONTRAST TECHNIQUE: Multiplanar and multiecho pulse sequences of the cervical spine, to include the craniocervical junction and cervicothoracic junction, and the thoracic spine, were obtained without and with intravenous contrast. CONTRAST:  63mL GADAVIST GADOBUTROL 1 MMOL/ML IV SOLN COMPARISON:  Prior CT from earlier the same day as well as previous MRI from 05/30/2020. FINDINGS: MRI CERVICAL SPINE FINDINGS Alignment: Examination moderately to severely degraded by motion artifact. Vertebral bodies normally aligned with preservation of the normal cervical lordosis. No listhesis. Vertebrae: Metastatic disease involving the upper thoracic spine noted, discussed on corresponding thoracic portion of this exam. Otherwise, signal intensity within the visualized bone marrow of the cervical spine is within normal limits. No metastatic involvement within the cervical spine itself. No other discrete or worrisome osseous lesions. No other abnormal marrow edema or enhancement. Vertebral body height maintained without acute or chronic fracture. Cord: Signal intensity within the cervical spinal cord is grossly within normal limits. No appreciable cord signal abnormality or abnormal enhancement on this motion degraded exam. Posterior Fossa, vertebral arteries, paraspinal tissues: Probable chronic microvascular ischemic disease noted within the partially visualized pons and brainstem. Visualized brain and posterior fossa otherwise unremarkable. Craniocervical junction within normal limits. Paraspinous soft tissues within normal limits. Normal flow voids seen within the vertebral  arteries bilaterally. Disc levels: Mild for age degenerative disc desiccation with minimal disc bulging throughout the cervical spine. No significant spinal stenosis or frank cord impingement. Foramina appear grossly patent. MRI THORACIC SPINE FINDINGS Alignment: Examination moderately to severely degraded by motion artifact. Vertebral bodies normally aligned with preservation of the normal thoracic kyphosis. Trace anterolisthesis of T9 on T10, with trace retrolisthesis of T10 on T11 noted. Vertebrae: Abnormal T1 hypointense, stir hyperintense enhancing lesions seen involving the T1 and T2 vertebral bodies, consistent with metastatic disease. Associated mild height loss at these levels consistent with associated pathologic fractures, most pronounced at the T2 level where height loss measures up to approximately 30-40%. Epidural extension with tumor seen involving the right greater than left ventral epidural space at this level (series 21, image 14). Mild flattening and mass effect on the thoracic cord posteriorly with up to moderate spinal stenosis. No visible cord signal changes on this motion degraded exam. Lateral extension to involve the T1-2 neural foramina bilaterally which are largely obliterated. Additional extension into the left greater than right T2-3 neural foramina with mild to moderate bilateral foraminal stenosis. Additional extension to involve the posterior elements including the bilateral pedicles and posterior facets noted as well. Additional metastatic lesion involving the T6 vertebral body seen as well. Associated pathologic compression fracture with up to 50% height loss. Epidural extension of tumor with small volume tumor seen extending into the ventral epidural space (series 21,  image 12). No more than mild spinal stenosis at this level. No other metastatic disease seen within the thoracic spine. Late subacute to chronic compression fracture involving the superior endplate of B71 with mild  30% height loss and 3 mm bony retropulsion noted. Multiple additional chronic compression fractures noted elsewhere throughout the thoracic spine, involving the T3, T5, T7, T8, and T10 vertebral bodies. Additional chronic compression fractures involving the upper lumbar spine at L1 through L3 noted. Sequelae of prior vertebral augmentation present at T7, T8, and T10. Cord: Signal intensity within the thoracic spinal cord is within normal limits. No appreciable cord signal changes on this motion degraded exam. Paraspinal and other soft tissues: Moderate right with small left layering pleural effusions. Innumerable pulmonary nodules seen throughout the visualized lungs, consistent with metastatic disease. Disc levels: Multilevel degenerative disc bulging seen throughout the thoracic spine extending from C5-6 through the visualized upper lumbar spine. Superimposed multilevel facet arthropathy. No other high-grade spinal stenosis. Neural foramina otherwise appear to be largely patent. IMPRESSION: 1. Motion degraded exam. 2. 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 Epidural extension with tumor extending into the ventral epidural space at these levels, resulting in up to moderate spinal stenosis and cord flattening. No visible cord signal changes on this motion degraded exam. 3. Additional metastatic lesion involving the T6 vertebral body with associated pathologic fracture and up to 50% height loss. Small volume ventral epidural extension of tumor with no more than mild spinal stenosis at this level. 4. No evidence for metastatic disease within the cervical spine. 5. Innumerable pulmonary nodules throughout the visualized lungs, consistent with metastatic disease. 6. Late subacute to chronic compression fracture of the superior endplate of I96 with mild 30% height loss and 3 mm bony retropulsion. Multiple additional chronic compression fractures as above.  7. Small to moderate bilateral pleural effusions, greater on the right. 8. 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 No other high-grade spinal stenosis or impingement. Electronically Signed   By: Jeannine Boga M.D.   On: 08/06/2021 19:22   DG Femur Min 2 Views Left  Result Date: 08/06/2021 CLINICAL DATA:  Left lower extremity muscle spasms. EXAM: LEFT FEMUR 2 VIEWS COMPARISON:  None. FINDINGS: There is no evidence of fracture or other focal bone lesions. A small joint effusion is seen within the left knee. IMPRESSION: 1. No acute osseous abnormality. 2. Small left knee joint effusion. Electronically Signed   By: Virgina Norfolk M.D.   On: 08/06/2021 20:23   US Abdomen Limited RUQ (LIVER/GB)  Result Date: 08/06/2021 CLINICAL DATA:  Dilated CBD EXAM: ULTRASOUND ABDOMEN LIMITED RIGHT UPPER QUADRANT COMPARISON:  CT 08/06/2021 FINDINGS: Gallbladder: Sludge and stones within the gallbladder. Normal wall thickness. No sonographic Percell Miller indicated but pain medication given per history form. Common bile duct: Diameter: 15.5 mm Liver: Liver slightly echogenic. No focal hepatic abnormality. Portal vein is patent on color Doppler imaging with normal direction of blood flow towards the liver. Other: None. IMPRESSION: 1. Gallstones without sonographic evidence for acute cholecystitis 2. Dilated common bile duct up to 15.5 mm. Consider correlation with MRCP to assess for ductal obstruction. 3. Slightly echogenic liver suggesting steatosis Electronically Signed   By: Donavan Foil M.D.   On: 08/06/2021 22:31    EKG: Independently reviewed. Sinus rhythm.   Assessment/Plan   1. Spinal stenosis; pathologic fractures; intractable pain; metastatic adenoid cystic carcinoma  - Pt with adenoid cystic carcinoma with metastases presents with  intractable pain in upper back and RUE numbness and weakness and found to have pathologic vertebral fxs and  epidural tumor with spinal stenoses  - ED discussed with orthopedic surgery who recommended NPO and will see her in am  - Continue multimodal pain-control    2. Dilated CBD  - Noted incidentally on CT in ED, check [place]    3. COPD; OSA; chronic hypoxic respiratory failure  - No recent cough or wheezing  - Continue supplemental O2, CPAP qHS, Breztri, and as needed albuterol    4. Chronic diastolic CHF  - EF was preserved on TTE from Jan 2022  - BNP normal, previously elevated, but she reports increased leg swelling, orthopnea, and SOB recently despite taking Lasix daily at home  - Plan to give one dose IV Lasix now, monitor wt and I/Os, and likely resume oral Lasix in am    5. Depression, anxiety  - Continue Zoloft, Buspar, benzodiazepine as needed    6. Type II DM  - A1c 5.6% one year ago  - Hold Actos, check CBGs, and use low-intensity SSI for now    7. PUD  - Continue PPI BID    8. CKD IIIa - SCr is 1.07 on admission, appears to be at baseline  - Renally-dose medications, monitor      DVT prophylaxis: SCDs  Code Status: Discussed with patient in ED, she wants to be Full Code at least until she can discuss with her husband  Level of Care: Level of care: Med-Surg Family Communication: None present   Disposition Plan:  Patient is from: Home  Anticipated d/c is to: TBD Anticipated d/c date is: 08/09/21  Patient currently: Pending pain-control, orthopedic surgery consultation, safe dispo plan   Consults called: orthopedic surgery  Admission status: Inpatient     Vianne Bulls, MD [facility]  08/06/2021, 11:18 PM

## 2021-08-06 NOTE — ED Notes (Signed)
Patient transported to Ultrasound 

## 2021-08-06 NOTE — ED Triage Notes (Signed)
Patient with history of adenoid cystic carcinoma complains of muscle spasms for the last two months. Patient also complains of shortness of breath with history of COPD and CHF. Wears 3L [place] at baseline, SpO2 92% on 3L [place]. Patient alert and oriented, denies any alterations in how she takes her lasix. Radiation for cancer in July 2022.

## 2021-08-07 DIAGNOSIS — M542 Cervicalgia: Secondary | ICD-10-CM | POA: Diagnosis not present

## 2021-08-07 DIAGNOSIS — M8458XA Pathological fracture in neoplastic disease, other specified site, initial encounter for fracture: Secondary | ICD-10-CM | POA: Diagnosis not present

## 2021-08-07 DIAGNOSIS — C7949 Secondary malignant neoplasm of other parts of nervous system: Secondary | ICD-10-CM | POA: Diagnosis not present

## 2021-08-07 LAB — HIV ANTIBODY (ROUTINE TESTING W REFLEX): HIV Screen 4th Generation wRfx: NONREACTIVE

## 2021-08-07 LAB — MRSA NEXT GEN BY PCR, NASAL: MRSA by PCR Next Gen: NOT DETECTED

## 2021-08-07 LAB — HEMOGLOBIN A1C
Hgb A1c MFr Bld: 6 % — ABNORMAL HIGH (ref 4.8–5.6)
Mean Plasma Glucose: 125.5 mg/dL

## 2021-08-07 LAB — CBC
HCT: 40.5 % (ref 36.0–46.0)
Hemoglobin: 12.3 g/dL (ref 12.0–15.0)
MCH: 30.3 pg (ref 26.0–34.0)
MCHC: 30.4 g/dL (ref 30.0–36.0)
MCV: 99.8 fL (ref 80.0–100.0)
Platelets: 270 10*3/uL (ref 150–400)
RBC: 4.06 MIL/uL (ref 3.87–5.11)
RDW: 14.4 % (ref 11.5–15.5)
WBC: 9.5 10*3/uL (ref 4.0–10.5)
nRBC: 0 % (ref 0.0–0.2)

## 2021-08-07 LAB — GLUCOSE, CAPILLARY
Glucose-Capillary: 128 mg/dL — ABNORMAL HIGH (ref 70–99)
Glucose-Capillary: 111 mg/dL — ABNORMAL HIGH (ref 70–99)
Glucose-Capillary: 116 mg/dL — ABNORMAL HIGH (ref 70–99)

## 2021-08-07 LAB — BASIC METABOLIC PANEL
Anion gap: 13 (ref 5–15)
BUN: 14 mg/dL (ref 8–23)
CO2: 31 mmol/L (ref 22–32)
Calcium: 9.5 mg/dL (ref 8.9–10.3)
Chloride: 94 mmol/L — ABNORMAL LOW (ref 98–111)
Creatinine, Ser: 1.04 mg/dL — ABNORMAL HIGH (ref 0.44–1.00)
GFR, Estimated: 59 mL/min — ABNORMAL LOW (ref >60)
Glucose, Bld: 132 mg/dL — ABNORMAL HIGH (ref 70–99)
Potassium: 4 mmol/L (ref 3.5–5.1)
Sodium: 138 mmol/L (ref 135–145)

## 2021-08-07 LAB — LACTIC ACID, PLASMA: Lactic Acid, Venous: 0.8 mmol/L (ref 0.5–1.9)

## 2021-08-07 LAB — CBG MONITORING, ED
Glucose-Capillary: 113 mg/dL — ABNORMAL HIGH (ref 70–99)
Glucose-Capillary: 118 mg/dL — ABNORMAL HIGH (ref 70–99)
Glucose-Capillary: 131 mg/dL — ABNORMAL HIGH (ref 70–99)

## 2021-08-07 LAB — TROPONIN I (HIGH SENSITIVITY): Troponin I (High Sensitivity): 10 ng/L (ref <18)

## 2021-08-07 MED ORDER — ONDANSETRON HCL 4 MG/2ML IJ SOLN
4.0000 mg | Freq: Four times a day (QID) | INTRAMUSCULAR | Status: DC | PRN
  Administered 2021-08-07: 4 mg via INTRAVENOUS
  Filled 2021-08-07: qty 2

## 2021-08-07 NOTE — Progress Notes (Signed)
RT note: Patient states she can not wear CPAP because of her nose and sinus passages.

## 2021-08-07 NOTE — Progress Notes (Signed)
Progress Note    Theresa Fernandez  VVO:[id number] DOB: [birth year]  DOA: 08/06/2021 PCP: [facility]    Brief Narrative:     Medical records reviewed and are as summarized below:  Theresa Fernandez is an 66 y.o. 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 [facility]) Active Problems:   Depression   Diabetes mellitus without complication (HCC)   Spinal compression fracture (HCC)   Other emphysema (HCC)   CKD (chronic kidney disease) stage 3, GFR 30-59 ml/min (HCC)   Chronic diastolic CHF (congestive heart failure) (HCC)   Chronic respiratory failure with hypoxia (HCC)   OSA (obstructive sleep apnea)   Gastric ulcer   Malignant neoplasm of parotid gland (HCC)   Dilation of biliary tract   Spinal stenosis; pathologic fractures; intractable pain; metastatic adenoid cystic carcinoma  - Pt with adenoid cystic carcinoma with metastases presents with intractable pain in upper back and RUE numbness and weakness and found to have pathologic vertebral fxs and epidural tumor with spinal stenoses  - ED discussed with orthopedic surgery who recommended NPO and will see her in am  - Continue multimodal pain-control     Dilated CBD  - Noted incidentally on CT in ED -u/s shows: Gallstones without sonographic evidence for acute cholecystitis 2. Dilated common bile duct up to 15.5 mm. Consider correlation with MRCP to assess for ductal obstruction. -LFTS not elevated currently, denies N/V abdominal pain so will hold on MRCP or further investigation until AFTER her back pain is addressed.   COPD; OSA; chronic  hypoxic respiratory failure  - No recent cough or wheezing  - Continue supplemental O2, CPAP qHS, Breztri, and as needed albuterol     Chronic diastolic CHF  - EF was preserved on TTE from Jan 2022  - BNP normal, previously elevated, but she reports increased leg swelling, orthopnea, and SOB recently despite taking Lasix daily at home  - monitor wt and I/Os -resume home lasix   Depression, anxiety  - Continue Zoloft, Buspar, benzodiazepine as needed     Type II DM  - A1c 5.6% one year ago  - Hold Actos, check CBGs, and use low-intensity SSI for now  -- change to with meals once no longer NPO   PUD  - Continue PPI BID     CKD IIIa - SCr is 1.07 on admission, appears to be at baseline  - Renally-dose medications, monitor        Family Communication/Anticipated D/C date and plan/Code Status   DVT prophylaxis:scd until surgery decided Code Status: Full Code.  Disposition Plan: Status is: Inpatient  Remains inpatient appropriate because: needs ortho eval         Medical Consultants:   ortho  Subjective:   Still c/o lots of nerve pain  Objective:    Vitals:   08/07/21 0400 08/07/21 0430 08/07/21 0530 08/07/21 0630  BP: 134/78 137/81 129/74 103/61  Pulse: (!) 109 (!) 114 (!) 110 (!) 107  Resp: 18 15 19 19   Temp:      TempSrc:      SpO2: 92% 94% 93% 92%   No intake or output data in the 24 hours ending 08/07/21 2694  There were no vitals filed for this visit.  Exam:  General: Appearance:    Severely obese female who appears to be in pain     Lungs:     respirations unlabored  Heart:    Tachycardic.   MS:   All extremities are intact.    Neurologic:   Awake, alert     Data Reviewed:   I have personally reviewed following labs and imaging studies:  Labs: Labs show the following:   Basic Metabolic Panel: Recent Labs  Lab 08/06/21 1630 08/07/21 0322  NA 140 138  K 4.0 4.0  CL 95* 94*  CO2 34* 31  GLUCOSE 99 132*  BUN 15 14  CREATININE  1.07* 1.04*  CALCIUM 9.1 9.5   GFR CrCl cannot be calculated (Unknown ideal weight.). Liver Function Tests: Recent Labs  Lab 08/06/21 1630  AST 19  ALT 15  ALKPHOS 90  BILITOT 0.5  PROT 7.0  ALBUMIN 3.2*   No results for input(s): LIPASE, AMYLASE in the last 168 hours. No results for input(s): AMMONIA in the last 168 hours. Coagulation profile No results for input(s): INR, PROTIME in the last 168 hours.  CBC: Recent Labs  Lab 08/06/21 1630 08/07/21 0322  WBC 9.5 9.5  NEUTROABS 6.8  --   HGB 11.6* 12.3  HCT 38.3 40.5  MCV 100.3* 99.8  PLT 269 270   Cardiac Enzymes: No results for input(s): CKTOTAL, CKMB, CKMBINDEX, TROPONINI in the last 168 hours. BNP (last 3 results) No results for input(s): PROBNP in the last 8760 hours. CBG: Recent Labs  Lab 08/07/21 0026 08/07/21 0628 08/07/21 0757  GLUCAP 131* 118* 113*   D-Dimer: No results for input(s): DDIMER in the last 72 hours. Hgb A1c: Recent Labs    08/07/21 0322  HGBA1C 6.0*   Lipid Profile: No results for input(s): CHOL, HDL, LDLCALC, TRIG, CHOLHDL, LDLDIRECT in the last 72 hours. Thyroid function studies: Recent Labs    08/06/21 1630  TSH 1.306   Anemia work up: No results for input(s): VITAMINB12, FOLATE, FERRITIN, TIBC, IRON, RETICCTPCT in the last 72 hours. Sepsis Labs: Recent Labs  Lab 08/06/21 1630 08/07/21 0321 08/07/21 0322  WBC 9.5  --  9.5  LATICACIDVEN 0.8 0.8  --     Microbiology Recent Results (from the past 240 hour(s))  Resp Panel by RT-PCR (Flu A&B, Covid) Nasopharyngeal Swab     Status: None   Collection Time: 08/06/21  4:06 PM   Specimen: Nasopharyngeal Swab; Nasopharyngeal(NP) swabs in vial transport medium  Result Value Ref Range Status   SARS Coronavirus 2 by RT PCR NEGATIVE NEGATIVE Final    Comment: (NOTE) SARS-CoV-2 target nucleic acids are NOT DETECTED.  The SARS-CoV-2 RNA is generally detectable in upper respiratory specimens during the acute phase of  infection. The lowest concentration of SARS-CoV-2 viral copies this assay can detect is 138 copies/mL. A negative result does not preclude SARS-Cov-2 infection and should not be used as the sole basis for treatment or other patient management decisions. A negative result may occur with  improper specimen collection/handling, submission of specimen other than nasopharyngeal swab, presence of viral mutation(s) within the areas targeted by this assay, and inadequate number of viral copies(<138 copies/mL). A negative result must be combined with clinical observations, patient history, and epidemiological information. The expected result is Negative.  Fact Sheet for Patients:  EntrepreneurPulse.com.au  Fact Sheet for Healthcare Providers:  IncredibleEmployment.be  This test is no t yet approved or cleared by the [place]  States FDA and  has been authorized for detection and/or diagnosis of SARS-CoV-2 by FDA under an Emergency Use Authorization (EUA). This EUA will remain  in effect (meaning this test can be used) for the duration of the COVID-19 declaration under Section 564(b)(1) of the Act, 21 U.S.C.section 360bbb-3(b)(1), unless the authorization is terminated  or revoked sooner.       Influenza A by PCR NEGATIVE NEGATIVE Final   Influenza B by PCR NEGATIVE NEGATIVE Final    Comment: (NOTE) The Xpert Xpress SARS-CoV-2/FLU/RSV plus assay is intended as an aid in the diagnosis of influenza from Nasopharyngeal swab specimens and should not be used as a sole basis for treatment. Nasal washings and aspirates are unacceptable for Xpert Xpress SARS-CoV-2/FLU/RSV testing.  Fact Sheet for Patients: EntrepreneurPulse.com.au  Fact Sheet for Healthcare Providers: IncredibleEmployment.be  This test is not yet approved or cleared by the [place] FDA and has been authorized for detection and/or diagnosis of SARS-CoV-2  by FDA under an Emergency Use Authorization (EUA). This EUA will remain in effect (meaning this test can be used) for the duration of the COVID-19 declaration under Section 564(b)(1) of the Act, 21 U.S.C. section 360bbb-3(b)(1), unless the authorization is terminated or revoked.  Performed at [facility], [place] [address], [place], [place] [address]     Procedures and diagnostic studies:  CT Angio Chest PE W and/or Wo Contrast  Result Date: 08/06/2021 CLINICAL DATA:  Worsening shortness of breath, known cancer, pleuritic chest discomfort. EXAM: CT ANGIOGRAPHY CHEST WITH CONTRAST TECHNIQUE: Multidetector CT imaging of the chest was performed using the standard protocol during bolus administration of intravenous contrast. Multiplanar CT image reconstructions and MIPs were obtained to evaluate the vascular anatomy. RADIATION DOSE REDUCTION: This exam was performed according to the departmental dose-optimization program which includes automated exposure control, adjustment of the mA and/or kV according to patient size and/or use of iterative reconstruction technique. CONTRAST:  69mL OMNIPAQUE IOHEXOL 350 MG/ML SOLN COMPARISON:  09/06/2019, 08/06/2021. FINDINGS: Cardiovascular: The heart is enlarged and no pericardial effusion is seen. Scattered coronary artery calcifications are noted. There is atherosclerotic calcification of the aorta without evidence of aneurysm. The pulmonary trunk is normal in caliber. No large central pulmonary artery filling defect. Evaluation of the pulmonary arteries is limited due to respiratory motion artifact. Mediastinum/Nodes: The thyroid gland, trachea, and esophagus are within normal limits. Enlarged lymph nodes are present in the mediastinum and right hilum. No axillary lymphadenopathy. Lungs/Pleura: Multiple pulmonary nodules and masses are seen bilaterally, the largest measuring 3.1 cm in the left lower lobe there is pleural thickening on the right with a small  loculated pleural effusion and right lower lobe consolidation. No pneumothorax is seen bilaterally. Upper Abdomen: Visualized portion of the common bile duct is prominent in size at 1.2 cm. Musculoskeletal: There is bony deformity of the proximal sternum suggesting old healed fracture. Mixed lytic and sclerotic lesions are present throughout the bones. There are compression deformities at T1, T2, T3, T5, T6, T7, T8, T9, T10, T12, and L1, better evaluated on recent MRI. Kyphoplasty changes are present at T10, T8, and T7 Review of the MIP images confirms the above findings. IMPRESSION: 1. No large central pulmonary embolus is identified. Evaluation of the pulmonary arteries is limited due to respiratory motion artifact. The need for short-term repeat evaluation should be determined clinically. 2. Innumerable pulmonary nodules and masses bilaterally measuring up to 3.1 cm, suspicious for metastatic disease. There is also pleural thickening with a small loculated pleural  effusion on the right. Consolidation is noted at the right lung base and the possibility of superimposed infection can not be excluded. 3. Multiple compression deformities, lytic and sclerotic lesions in the bones, and kyphoplasty changes in the thoracic spine, better evaluated on recent MRI. 4. Cardiomegaly with scattered coronary artery calcifications. 5. Aortic atherosclerosis. 6. Dilatation of the common bile duct measuring up to 1.2 cm. Ultrasound is suggested for further evaluation. Electronically Signed   By: Brett Fairy M.D.   On: 08/06/2021 20:31   CT Cervical Spine Wo Contrast  Result Date: 08/06/2021 CLINICAL DATA:  Acute neck pain EXAM: CT CERVICAL SPINE WITHOUT CONTRAST TECHNIQUE: Multidetector CT imaging of the cervical spine was performed without intravenous contrast. Multiplanar CT image reconstructions were also generated. RADIATION DOSE REDUCTION: This exam was performed according to the departmental dose-optimization program  which includes automated exposure control, adjustment of the mA and/or kV according to patient size and/or use of iterative reconstruction technique. COMPARISON:  CT neck 11/01/2020 FINDINGS: Alignment: No acute subluxation. Skull base and vertebrae: Bony destructive lesion again seen at the anterior left mastoid air cells with associated adjacent irregular soft tissue mass density. The soft tissue mass appears decreased in size since previous study. No acute vertebral body fracture identified. Diffuse heterogeneous lucent appearance of the vertebral bodies especially in the lower cervical and upper thoracic spine, suggesting metastatic disease. Associated apparent soft tissue density extending dorsally into the spinal canal from C4 through C7. Soft tissues and spinal canal: No prevertebral soft tissue edema. No evidence of visible canal hematoma. Disc levels: Mild intervertebral disc space narrowing throughout the cervical spine. Upper chest: Multiple metastatic pulmonary nodules are visualized in the upper lungs which are increased since previous study measuring up to 2 cm near the left apex. Prominent superior mediastinal lymph nodes. Other: None. IMPRESSION: 1. Extensive osseous metastatic disease especially in the lower cervical and visualized upper thoracic vertebral bodies. Associated apparent soft tissue density extending dorsally into the spinal canal from C4 through C7, limited visualization with CT. Correlate clinically and consider follow-up MRI with contrast to evaluate degree of spinal canal stenoses and possible cord compression if indicated. 2. Multiple metastatic pulmonary nodules which are increased in size since previous study. 3. Redemonstration and slightly decreased size of an invasive irregular mass deep to the left parotid with destruction and invasion into the left mastoids as seen previously. Electronically Signed   By: Ofilia Neas M.D.   On: 08/06/2021 12:59   MR Cervical Spine W  or Wo Contrast  Result Date: 08/06/2021 CLINICAL DATA:  Follow-up examination for metastatic disease. EXAM: MRI CERVICAL AND THORACIC SPINE WITHOUT AND WITH CONTRAST TECHNIQUE: Multiplanar and multiecho pulse sequences of the cervical spine, to include the craniocervical junction and cervicothoracic junction, and the thoracic spine, were obtained without and with intravenous contrast. CONTRAST:  59mL GADAVIST GADOBUTROL 1 MMOL/ML IV SOLN COMPARISON:  Prior CT from earlier the same day as well as previous MRI from 05/30/2020. FINDINGS: MRI CERVICAL SPINE FINDINGS Alignment: Examination moderately to severely degraded by motion artifact. Vertebral bodies normally aligned with preservation of the normal cervical lordosis. No listhesis. Vertebrae: Metastatic disease involving the upper thoracic spine noted, discussed on corresponding thoracic portion of this exam. Otherwise, signal intensity within the visualized bone marrow of the cervical spine is within normal limits. No metastatic involvement within the cervical spine itself. No other discrete or worrisome osseous lesions. No other abnormal marrow edema or enhancement. Vertebral body height maintained without acute or chronic fracture.  Cord: Signal intensity within the cervical spinal cord is grossly within normal limits. No appreciable cord signal abnormality or abnormal enhancement on this motion degraded exam. Posterior Fossa, vertebral arteries, paraspinal tissues: Probable chronic microvascular ischemic disease noted within the partially visualized pons and brainstem. Visualized brain and posterior fossa otherwise unremarkable. Craniocervical junction within normal limits. Paraspinous soft tissues within normal limits. Normal flow voids seen within the vertebral arteries bilaterally. Disc levels: Mild for age degenerative disc desiccation with minimal disc bulging throughout the cervical spine. No significant spinal stenosis or frank cord impingement.  Foramina appear grossly patent. MRI THORACIC SPINE FINDINGS Alignment: Examination moderately to severely degraded by motion artifact. Vertebral bodies normally aligned with preservation of the normal thoracic kyphosis. Trace anterolisthesis of T9 on T10, with trace retrolisthesis of T10 on T11 noted. Vertebrae: Abnormal T1 hypointense, stir hyperintense enhancing lesions seen involving the T1 and T2 vertebral bodies, consistent with metastatic disease. Associated mild height loss at these levels consistent with associated pathologic fractures, most pronounced at the T2 level where height loss measures up to approximately 30-40%. Epidural extension with tumor seen involving the right greater than left ventral epidural space at this level (series 21, image 14). Mild flattening and mass effect on the thoracic cord posteriorly with up to moderate spinal stenosis. No visible cord signal changes on this motion degraded exam. Lateral extension to involve the T1-2 neural foramina bilaterally which are largely obliterated. Additional extension into the left greater than right T2-3 neural foramina with mild to moderate bilateral foraminal stenosis. Additional extension to involve the posterior elements including the bilateral pedicles and posterior facets noted as well. Additional metastatic lesion involving the T6 vertebral body seen as well. Associated pathologic compression fracture with up to 50% height loss. Epidural extension of tumor with small volume tumor seen extending into the ventral epidural space (series 21, image 12). No more than mild spinal stenosis at this level. No other metastatic disease seen within the thoracic spine. Late subacute to chronic compression fracture involving the superior endplate of V56 with mild 30% height loss and 3 mm bony retropulsion noted. Multiple additional chronic compression fractures noted elsewhere throughout the thoracic spine, involving the T3, T5, T7, T8, and T10 vertebral  bodies. Additional chronic compression fractures involving the upper lumbar spine at L1 through L3 noted. Sequelae of prior vertebral augmentation present at T7, T8, and T10. Cord: Signal intensity within the thoracic spinal cord is within normal limits. No appreciable cord signal changes on this motion degraded exam. Paraspinal and other soft tissues: Moderate right with small left layering pleural effusions. Innumerable pulmonary nodules seen throughout the visualized lungs, consistent with metastatic disease. Disc levels: Multilevel degenerative disc bulging seen throughout the thoracic spine extending from C5-6 through the visualized upper lumbar spine. Superimposed multilevel facet arthropathy. No other high-grade spinal stenosis. Neural foramina otherwise appear to be largely patent. IMPRESSION: 1. Motion degraded exam. 2. 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 Epidural extension with tumor extending into the ventral epidural space at these levels, resulting in up to moderate spinal stenosis and cord flattening. No visible cord signal changes on this motion degraded exam. 3. Additional metastatic lesion involving the T6 vertebral body with associated pathologic fracture and up to 50% height loss. Small volume ventral epidural extension of tumor with no more than mild spinal stenosis at this level. 4. No evidence for metastatic disease within the cervical spine. 5. Innumerable pulmonary nodules throughout the visualized lungs, consistent  with metastatic disease. 6. Late subacute to chronic compression fracture of the superior endplate of N23 with mild 30% height loss and 3 mm bony retropulsion. Multiple additional chronic compression fractures as above. 7. Small to moderate bilateral pleural effusions, greater on the right. 8. 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 No other  high-grade spinal stenosis or impingement. Electronically Signed   By: Jeannine Boga M.D.   On: 08/06/2021 19:22   MR THORACIC SPINE W WO CONTRAST  Result Date: 08/06/2021 CLINICAL DATA:  Follow-up examination for metastatic disease. EXAM: MRI CERVICAL AND THORACIC SPINE WITHOUT AND WITH CONTRAST TECHNIQUE: Multiplanar and multiecho pulse sequences of the cervical spine, to include the craniocervical junction and cervicothoracic junction, and the thoracic spine, were obtained without and with intravenous contrast. CONTRAST:  94mL GADAVIST GADOBUTROL 1 MMOL/ML IV SOLN COMPARISON:  Prior CT from earlier the same day as well as previous MRI from 05/30/2020. FINDINGS: MRI CERVICAL SPINE FINDINGS Alignment: Examination moderately to severely degraded by motion artifact. Vertebral bodies normally aligned with preservation of the normal cervical lordosis. No listhesis. Vertebrae: Metastatic disease involving the upper thoracic spine noted, discussed on corresponding thoracic portion of this exam. Otherwise, signal intensity within the visualized bone marrow of the cervical spine is within normal limits. No metastatic involvement within the cervical spine itself. No other discrete or worrisome osseous lesions. No other abnormal marrow edema or enhancement. Vertebral body height maintained without acute or chronic fracture. Cord: Signal intensity within the cervical spinal cord is grossly within normal limits. No appreciable cord signal abnormality or abnormal enhancement on this motion degraded exam. Posterior Fossa, vertebral arteries, paraspinal tissues: Probable chronic microvascular ischemic disease noted within the partially visualized pons and brainstem. Visualized brain and posterior fossa otherwise unremarkable. Craniocervical junction within normal limits. Paraspinous soft tissues within normal limits. Normal flow voids seen within the vertebral arteries bilaterally. Disc levels: Mild for age  degenerative disc desiccation with minimal disc bulging throughout the cervical spine. No significant spinal stenosis or frank cord impingement. Foramina appear grossly patent. MRI THORACIC SPINE FINDINGS Alignment: Examination moderately to severely degraded by motion artifact. Vertebral bodies normally aligned with preservation of the normal thoracic kyphosis. Trace anterolisthesis of T9 on T10, with trace retrolisthesis of T10 on T11 noted. Vertebrae: Abnormal T1 hypointense, stir hyperintense enhancing lesions seen involving the T1 and T2 vertebral bodies, consistent with metastatic disease. Associated mild height loss at these levels consistent with associated pathologic fractures, most pronounced at the T2 level where height loss measures up to approximately 30-40%. Epidural extension with tumor seen involving the right greater than left ventral epidural space at this level (series 21, image 14). Mild flattening and mass effect on the thoracic cord posteriorly with up to moderate spinal stenosis. No visible cord signal changes on this motion degraded exam. Lateral extension to involve the T1-2 neural foramina bilaterally which are largely obliterated. Additional extension into the left greater than right T2-3 neural foramina with mild to moderate bilateral foraminal stenosis. Additional extension to involve the posterior elements including the bilateral pedicles and posterior facets noted as well. Additional metastatic lesion involving the T6 vertebral body seen as well. Associated pathologic compression fracture with up to 50% height loss. Epidural extension of tumor with small volume tumor seen extending into the ventral epidural space (series 21, image 12). No more than mild spinal stenosis at this level. No other metastatic disease seen within the thoracic spine. Late subacute to chronic compression fracture involving the superior  endplate of Y50 with mild 30% height loss and 3 mm bony retropulsion noted.  Multiple additional chronic compression fractures noted elsewhere throughout the thoracic spine, involving the T3, T5, T7, T8, and T10 vertebral bodies. Additional chronic compression fractures involving the upper lumbar spine at L1 through L3 noted. Sequelae of prior vertebral augmentation present at T7, T8, and T10. Cord: Signal intensity within the thoracic spinal cord is within normal limits. No appreciable cord signal changes on this motion degraded exam. Paraspinal and other soft tissues: Moderate right with small left layering pleural effusions. Innumerable pulmonary nodules seen throughout the visualized lungs, consistent with metastatic disease. Disc levels: Multilevel degenerative disc bulging seen throughout the thoracic spine extending from C5-6 through the visualized upper lumbar spine. Superimposed multilevel facet arthropathy. No other high-grade spinal stenosis. Neural foramina otherwise appear to be largely patent. IMPRESSION: 1. Motion degraded exam. 2. 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 Epidural extension with tumor extending into the ventral epidural space at these levels, resulting in up to moderate spinal stenosis and cord flattening. No visible cord signal changes on this motion degraded exam. 3. Additional metastatic lesion involving the T6 vertebral body with associated pathologic fracture and up to 50% height loss. Small volume ventral epidural extension of tumor with no more than mild spinal stenosis at this level. 4. No evidence for metastatic disease within the cervical spine. 5. Innumerable pulmonary nodules throughout the visualized lungs, consistent with metastatic disease. 6. Late subacute to chronic compression fracture of the superior endplate of P54 with mild 30% height loss and 3 mm bony retropulsion. Multiple additional chronic compression fractures as above. 7. Small to moderate bilateral pleural effusions,  greater on the right. 8. 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 No other high-grade spinal stenosis or impingement. Electronically Signed   By: Jeannine Boga M.D.   On: 08/06/2021 19:22   DG Femur Min 2 Views Left  Result Date: 08/06/2021 CLINICAL DATA:  Left lower extremity muscle spasms. EXAM: LEFT FEMUR 2 VIEWS COMPARISON:  None. FINDINGS: There is no evidence of fracture or other focal bone lesions. A small joint effusion is seen within the left knee. IMPRESSION: 1. No acute osseous abnormality. 2. Small left knee joint effusion. Electronically Signed   By: Virgina Norfolk M.D.   On: 08/06/2021 20:23   US Abdomen Limited RUQ (LIVER/GB)  Result Date: 08/06/2021 CLINICAL DATA:  Dilated CBD EXAM: ULTRASOUND ABDOMEN LIMITED RIGHT UPPER QUADRANT COMPARISON:  CT 08/06/2021 FINDINGS: Gallbladder: Sludge and stones within the gallbladder. Normal wall thickness. No sonographic Percell Miller indicated but pain medication given per history form. Common bile duct: Diameter: 15.5 mm Liver: Liver slightly echogenic. No focal hepatic abnormality. Portal vein is patent on color Doppler imaging with normal direction of blood flow towards the liver. Other: None. IMPRESSION: 1. Gallstones without sonographic evidence for acute cholecystitis 2. Dilated common bile duct up to 15.5 mm. Consider correlation with MRCP to assess for ductal obstruction. 3. Slightly echogenic liver suggesting steatosis Electronically Signed   By: Donavan Foil M.D.   On: 08/06/2021 22:31    Medications:    acetaminophen  650 mg Oral Q6H   busPIRone  30 mg Oral QHS   fluticasone furoate-vilanterol  1 puff Inhalation Daily   furosemide  40 mg Oral Daily   insulin aspart  0-6 Units Subcutaneous Q4H   lamoTRIgine  25 mg Oral QHS   pantoprazole  40 mg Oral BID  pravastatin  80 mg Oral q1800   prazosin  5 mg Oral QHS   pregabalin  150 mg Oral BID   sertraline  200 mg  Oral QHS   umeclidinium bromide  1 puff Inhalation Daily   valACYclovir  1,000 mg Oral BID   Continuous Infusions:   LOS: 1 day   Geradine Girt  [facility]   How to contact the [facility] Attending or Consulting provider [place] or covering provider during after hours [place], for this patient?  Check the care team in [facility] and look for a) attending/consulting [facility] provider listed and b) the [facility] team listed Log into [email or web address] and use [place]'s universal password to access. If you do not have the password, please contact the hospital operator. Locate the [facility] provider you are looking for under [facility] and page to a number that you can be directly reached. If you still have difficulty reaching the provider, please page the [facility] (Director on Call) for the Hospitalists listed on amion for assistance.  08/07/2021, 8:28 AM

## 2021-08-07 NOTE — Consult Note (Signed)
Orthopaedic Consult  Date/Time: 08/07/21 5:32 PM  Patient Name: Theresa Fernandez  Attending Physician: Geradine Girt, DO   Time first seen by orthopaedics: [place] PLAN  Orthopaedic Assessment: 66 y.o. 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  Reductions/Procedures/Splinting/Anesthesia Performed: Reductions: None Splinting/casting: None Procedure(s): None Anesthesia: N/A  Plan: Patient is established with and has seen Dr. Lynann Bologna in clinic.  Discussed patient with Dr. Lynann Bologna.  No urgent operative interventions at this time.     Georgeanna Harrison M.D. Orthopaedic Surgery [facility]   Medical Decision Making  Amount/complexity of data: Is there a pathologic fracture (e.g. neoplastic, osteoporotic insufficiency fracture)? Yes Independent interpretation of radiographic studies: Yes Review of radiology results (e.g. reports): Yes Tests ordered (e.g. additional radiographic studies, labs): No Lab results reviewed: Yes Reviewed old records: Yes History from another source (independent historian, e.g. family/friend/etc.): Yes Risk: Patient receiving IV controlled substances for pain: Yes Fracture requiring manipulation: No Urgent or emergent (non-elective) surgery likely this admission: No Presence of medical comorbidities and/or surgical risk factors (e.g. current smoker, CAD, diabetes, COPD, CKD, etc.): Yes Closed fracture management WITHOUT manipulation: No Urgent minor procedure (e.g. joint aspiration, compartment pressure measurement, etc.): No Will likely need surgery as an outpatient: Yes     HPI Theresa Fernandez is a 66 y.o. female. Orthopaedic consultation has specifically been requested to address this patient's current musculoskeletal presentation.  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  She  presented to the emergency room with acutely worsening back pain, leg pain, and R pain.  She denies any loss of bowel or bladder function or new increasing weakness or paralysis.   PMH Past Medical History:  Diagnosis Date   Allergy    Anxiety    Arthritis    CHF (congestive heart failure) ([place])    COPD (chronic obstructive pulmonary disease) ([place])    Depression    Diabetes mellitus without complication ([place])    HTN (hypertension) 01/12/2013   pt denies     [place] Past Surgical History:  Procedure Laterality Date   BIOPSY  08/01/2020   Procedure: BIOPSY;  Surgeon: Milus Banister, MD;  Location: [facility] ENDOSCOPY;  Service: Endoscopy;;   ESOPHAGOGASTRODUODENOSCOPY (EGD) WITH PROPOFOL N/A 08/01/2020   Procedure: ESOPHAGOGASTRODUODENOSCOPY (EGD) WITH PROPOFOL;  Surgeon: Milus Banister, MD;  Location: [facility] ENDOSCOPY;  Service: Endoscopy;  Laterality: N/A;   MASTOIDECTOMY     TONSILLECTOMY     TUBAL LIGATION     Home Medications Prior to Admission medications   Medication Sig Start Date End Date Taking? Authorizing Provider  albuterol (ACCUNEB) 0.63 MG/3ML nebulizer solution Take 3 mLs (0.63 mg total) by nebulization every 4 (four) hours as needed for wheezing or shortness of breath. 07/17/20  Yes Icard, Bradley L, DO  albuterol (VENTOLIN HFA) 108 (90 Base) MCG/ACT inhaler Inhale 2 puffs into the lungs every 6 (six) hours as needed for wheezing or shortness of breath (wheezing). 07/17/20  Yes Icard, Octavio Graves, DO  ALPRAZolam (XANAX) 0.5 MG tablet Take 0.5 mg by mouth 2 (two) times daily as needed for anxiety. 11/28/19  Yes [provider]  baclofen (LIORESAL) 10 MG tablet Take 10 mg by mouth 3 (three) times daily as needed for muscle spasms. 07/28/21  Yes [provider]  Budeson-Glycopyrrol-Formoterol (BREZTRI AEROSPHERE) 160-9-4.8 MCG/ACT AERO Inhale 2 puffs into the lungs in the morning and at bedtime. 05/17/21  Yes  Icard, Bradley L, DO  busPIRone (BUSPAR) 15 MG tablet Take 30  mg by mouth 2 (two) times daily.   Yes [provider]  furosemide (LASIX) 40 MG tablet Take 1 tablet (40 mg total) by mouth 2 (two) times daily. Patient taking differently: Take 40 mg by mouth daily as needed for fluid or edema. 10/19/18  Yes Hilty, Nadean Corwin, MD  hydrOXYzine (ATARAX/VISTARIL) 25 MG tablet Take 25 mg by mouth daily. 04/21/18  Yes [provider]  lamoTRIgine (LAMICTAL) 25 MG tablet Take 25 mg by mouth at bedtime.  04/21/18  Yes [provider]  lovastatin (MEVACOR) 40 MG tablet Take 80 mg by mouth daily. 03/18/20  Yes [provider]  naloxone (NARCAN) nasal spray 4 mg/0.1 mL Place 4 mg into the nose as needed for opioid reversal. 04/07/21  Yes [provider]  oxyCODONE-acetaminophen (PERCOCET) 10-325 MG tablet Take 1 tablet by mouth See admin instructions. Every 4 to 6 hours as needed for pain 08/01/21  Yes [provider]  pantoprazole (PROTONIX) 40 MG tablet Take 1 tablet (40 mg total) by mouth 2 (two) times daily. 08/01/20 09/29/21 Yes Swayze, Ava, DO  pioglitazone (ACTOS) 15 MG tablet Take 15 mg by mouth daily. 09/13/19  Yes [provider]  prazosin (MINIPRESS) 5 MG capsule Take 5 mg by mouth at bedtime. 12/04/19  Yes [provider]  pregabalin (LYRICA) 150 MG capsule Take 1 capsule (150 mg total) by mouth at bedtime. Patient taking differently: Take 100 mg by mouth 2 (two) times daily. 02/11/13  Yes Johnson, Clanford L, MD  pregabalin (LYRICA) 150 MG capsule Take 1 capsule by mouth 2 (two) times daily. 08/15/20  Yes [provider]  sertraline (ZOLOFT) 100 MG tablet Take 200 mg by mouth in the morning and at bedtime.   Yes [provider]  Tiotropium Bromide-Olodaterol (STIOLTO RESPIMAT) 2.5-2.5 MCG/ACT AERS Inhale 2 puffs into the lungs daily. Patient taking differently: Inhale 2 puffs into the lungs in the morning and at bedtime. 07/17/20  Yes Icard, Octavio Graves, DO  valACYclovir (VALTREX) 1000  MG tablet Take 1,000 mg by mouth 2 (two) times daily. 07/28/21  Yes [provider]  budesonide-formoterol (SYMBICORT) 80-4.5 MCG/ACT inhaler Inhale 2 puffs into the lungs 2 (two) times daily. Patient not taking: No sig reported 07/20/18 11/23/19  Martyn Ehrich, NP     Allergies Allergies  Allergen Reactions   Ceftriaxone Hives and Rash    After second dose on 06/07/2018,   Topamax [Topiramate] Other (See Comments)    Made headache worse   Capsaicin Rash   Voltaren [Diclofenac Sodium] Other (See Comments)    Caused migraine     Family History Family History  Problem Relation Age of Onset   Diabetes Mother    Alcohol abuse Father    Diabetes Father    Stroke Brother     Social History Social History   Socioeconomic History   Marital status: Married    Spouse name: Not on file   Number of children: Not on file   Years of education: Not on file   Highest education level: Not on file  Occupational History   Not on file  Tobacco Use   Smoking status: Former    Packs/day: 3.00    Years: 50.00    Pack years: 150.00    Types: Cigarettes    Quit date: 05/30/2018    Years since quitting: 3.1   Smokeless tobacco: Never  Vaping Use  Vaping Use: Never used  Substance and Sexual Activity   Alcohol use: No   Drug use: No   Sexual activity: Not on file  Other Topics Concern   Not on file  Social History Narrative   Not on file   Social Determinants of Health   Financial Resource Strain: Not on file  Food Insecurity: Not on file  Transportation Needs: Not on file  Physical Activity: Not on file  Stress: Not on file  Social Connections: Not on file  Intimate Partner Violence: Not on file     Review of Systems MSK: As noted per HPI above GI: No current Nausea/vomiting ENT: Denies sore throat, epistaxis CV: Denies chest pain  Resp: No current shortness of breath  Other than mentioned above, there are no Constitutional, Neurological, Psychiatric, ENT,  Ophthalmological, Cardiovascular, Respiratory, GI, GU, Musculoskeletal, Integumentary, Lymphatic, Endocrine or Allergic issues.     Imaging  Independent interpretation of orthopaedic-relevant films: AP and lateral x-rays left femur: No fractures or other acute osseous.  No bone lesions appreciated. MRI cervical and thoracic spine: Metastatic disease is present at multiple levels of the thoracic spine.  Study compromised by motion artifact.  Pathologic compression fractures present at T1-T2 and T6 with associated epidural extension of metastatic disease.  No areas of severe cord compromise.  Radiographic results: CT Angio Chest PE W and/or Wo Contrast  Result Date: 08/06/2021 CLINICAL DATA:  Worsening shortness of breath, known cancer, pleuritic chest discomfort. EXAM: CT ANGIOGRAPHY CHEST WITH CONTRAST TECHNIQUE: Multidetector CT imaging of the chest was performed using the standard protocol during bolus administration of intravenous contrast. Multiplanar CT image reconstructions and MIPs were obtained to evaluate the vascular anatomy. RADIATION DOSE REDUCTION: This exam was performed according to the departmental dose-optimization program which includes automated exposure control, adjustment of the mA and/or kV according to patient size and/or use of iterative reconstruction technique. CONTRAST:  69mL OMNIPAQUE IOHEXOL 350 MG/ML SOLN COMPARISON:  09/06/2019, 08/06/2021. FINDINGS: Cardiovascular: The heart is enlarged and no pericardial effusion is seen. Scattered coronary artery calcifications are noted. There is atherosclerotic calcification of the aorta without evidence of aneurysm. The pulmonary trunk is normal in caliber. No large central pulmonary artery filling defect. Evaluation of the pulmonary arteries is limited due to respiratory motion artifact. Mediastinum/Nodes: The thyroid gland, trachea, and esophagus are within normal limits. Enlarged lymph nodes are present in the mediastinum and right  hilum. No axillary lymphadenopathy. Lungs/Pleura: Multiple pulmonary nodules and masses are seen bilaterally, the largest measuring 3.1 cm in the left lower lobe there is pleural thickening on the right with a small loculated pleural effusion and right lower lobe consolidation. No pneumothorax is seen bilaterally. Upper Abdomen: Visualized portion of the common bile duct is prominent in size at 1.2 cm. Musculoskeletal: There is bony deformity of the proximal sternum suggesting old healed fracture. Mixed lytic and sclerotic lesions are present throughout the bones. There are compression deformities at T1, T2, T3, T5, T6, T7, T8, T9, T10, T12, and L1, better evaluated on recent MRI. Kyphoplasty changes are present at T10, T8, and T7 Review of the MIP images confirms the above findings. IMPRESSION: 1. No large central pulmonary embolus is identified. Evaluation of the pulmonary arteries is limited due to respiratory motion artifact. The need for short-term repeat evaluation should be determined clinically. 2. Innumerable pulmonary nodules and masses bilaterally measuring up to 3.1 cm, suspicious for metastatic disease. There is also pleural thickening with a small loculated pleural effusion on the right. Consolidation  is noted at the right lung base and the possibility of superimposed infection can not be excluded. 3. Multiple compression deformities, lytic and sclerotic lesions in the bones, and kyphoplasty changes in the thoracic spine, better evaluated on recent MRI. 4. Cardiomegaly with scattered coronary artery calcifications. 5. Aortic atherosclerosis. 6. Dilatation of the common bile duct measuring up to 1.2 cm. Ultrasound is suggested for further evaluation. Electronically Signed   By: Brett Fairy M.D.   On: 08/06/2021 20:31   CT Cervical Spine Wo Contrast  Result Date: 08/06/2021 CLINICAL DATA:  Acute neck pain EXAM: CT CERVICAL SPINE WITHOUT CONTRAST TECHNIQUE: Multidetector CT imaging of the cervical  spine was performed without intravenous contrast. Multiplanar CT image reconstructions were also generated. RADIATION DOSE REDUCTION: This exam was performed according to the departmental dose-optimization program which includes automated exposure control, adjustment of the mA and/or kV according to patient size and/or use of iterative reconstruction technique. COMPARISON:  CT neck 11/01/2020 FINDINGS: Alignment: No acute subluxation. Skull base and vertebrae: Bony destructive lesion again seen at the anterior left mastoid air cells with associated adjacent irregular soft tissue mass density. The soft tissue mass appears decreased in size since previous study. No acute vertebral body fracture identified. Diffuse heterogeneous lucent appearance of the vertebral bodies especially in the lower cervical and upper thoracic spine, suggesting metastatic disease. Associated apparent soft tissue density extending dorsally into the spinal canal from C4 through C7. Soft tissues and spinal canal: No prevertebral soft tissue edema. No evidence of visible canal hematoma. Disc levels: Mild intervertebral disc space narrowing throughout the cervical spine. Upper chest: Multiple metastatic pulmonary nodules are visualized in the upper lungs which are increased since previous study measuring up to 2 cm near the left apex. Prominent superior mediastinal lymph nodes. Other: None. IMPRESSION: 1. Extensive osseous metastatic disease especially in the lower cervical and visualized upper thoracic vertebral bodies. Associated apparent soft tissue density extending dorsally into the spinal canal from C4 through C7, limited visualization with CT. Correlate clinically and consider follow-up MRI with contrast to evaluate degree of spinal canal stenoses and possible cord compression if indicated. 2. Multiple metastatic pulmonary nodules which are increased in size since previous study. 3. Redemonstration and slightly decreased size of an  invasive irregular mass deep to the left parotid with destruction and invasion into the left mastoids as seen previously. Electronically Signed   By: Ofilia Neas M.D.   On: 08/06/2021 12:59   MR Cervical Spine W or Wo Contrast  Result Date: 08/06/2021 CLINICAL DATA:  Follow-up examination for metastatic disease. EXAM: MRI CERVICAL AND THORACIC SPINE WITHOUT AND WITH CONTRAST TECHNIQUE: Multiplanar and multiecho pulse sequences of the cervical spine, to include the craniocervical junction and cervicothoracic junction, and the thoracic spine, were obtained without and with intravenous contrast. CONTRAST:  67mL GADAVIST GADOBUTROL 1 MMOL/ML IV SOLN COMPARISON:  Prior CT from earlier the same day as well as previous MRI from 05/30/2020. FINDINGS: MRI CERVICAL SPINE FINDINGS Alignment: Examination moderately to severely degraded by motion artifact. Vertebral bodies normally aligned with preservation of the normal cervical lordosis. No listhesis. Vertebrae: Metastatic disease involving the upper thoracic spine noted, discussed on corresponding thoracic portion of this exam. Otherwise, signal intensity within the visualized bone marrow of the cervical spine is within normal limits. No metastatic involvement within the cervical spine itself. No other discrete or worrisome osseous lesions. No other abnormal marrow edema or enhancement. Vertebral body height maintained without acute or chronic fracture. Cord: Signal intensity within the  cervical spinal cord is grossly within normal limits. No appreciable cord signal abnormality or abnormal enhancement on this motion degraded exam. Posterior Fossa, vertebral arteries, paraspinal tissues: Probable chronic microvascular ischemic disease noted within the partially visualized pons and brainstem. Visualized brain and posterior fossa otherwise unremarkable. Craniocervical junction within normal limits. Paraspinous soft tissues within normal limits. Normal flow voids seen  within the vertebral arteries bilaterally. Disc levels: Mild for age degenerative disc desiccation with minimal disc bulging throughout the cervical spine. No significant spinal stenosis or frank cord impingement. Foramina appear grossly patent. MRI THORACIC SPINE FINDINGS Alignment: Examination moderately to severely degraded by motion artifact. Vertebral bodies normally aligned with preservation of the normal thoracic kyphosis. Trace anterolisthesis of T9 on T10, with trace retrolisthesis of T10 on T11 noted. Vertebrae: Abnormal T1 hypointense, stir hyperintense enhancing lesions seen involving the T1 and T2 vertebral bodies, consistent with metastatic disease. Associated mild height loss at these levels consistent with associated pathologic fractures, most pronounced at the T2 level where height loss measures up to approximately 30-40%. Epidural extension with tumor seen involving the right greater than left ventral epidural space at this level (series 21, image 14). Mild flattening and mass effect on the thoracic cord posteriorly with up to moderate spinal stenosis. No visible cord signal changes on this motion degraded exam. Lateral extension to involve the T1-2 neural foramina bilaterally which are largely obliterated. Additional extension into the left greater than right T2-3 neural foramina with mild to moderate bilateral foraminal stenosis. Additional extension to involve the posterior elements including the bilateral pedicles and posterior facets noted as well. Additional metastatic lesion involving the T6 vertebral body seen as well. Associated pathologic compression fracture with up to 50% height loss. Epidural extension of tumor with small volume tumor seen extending into the ventral epidural space (series 21, image 12). No more than mild spinal stenosis at this level. No other metastatic disease seen within the thoracic spine. Late subacute to chronic compression fracture involving the superior  endplate of A19 with mild 30% height loss and 3 mm bony retropulsion noted. Multiple additional chronic compression fractures noted elsewhere throughout the thoracic spine, involving the T3, T5, T7, T8, and T10 vertebral bodies. Additional chronic compression fractures involving the upper lumbar spine at L1 through L3 noted. Sequelae of prior vertebral augmentation present at T7, T8, and T10. Cord: Signal intensity within the thoracic spinal cord is within normal limits. No appreciable cord signal changes on this motion degraded exam. Paraspinal and other soft tissues: Moderate right with small left layering pleural effusions. Innumerable pulmonary nodules seen throughout the visualized lungs, consistent with metastatic disease. Disc levels: Multilevel degenerative disc bulging seen throughout the thoracic spine extending from C5-6 through the visualized upper lumbar spine. Superimposed multilevel facet arthropathy. No other high-grade spinal stenosis. Neural foramina otherwise appear to be largely patent. IMPRESSION: 1. Motion degraded exam. 2. 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 Epidural extension with tumor extending into the ventral epidural space at these levels, resulting in up to moderate spinal stenosis and cord flattening. No visible cord signal changes on this motion degraded exam. 3. Additional metastatic lesion involving the T6 vertebral body with associated pathologic fracture and up to 50% height loss. Small volume ventral epidural extension of tumor with no more than mild spinal stenosis at this level. 4. No evidence for metastatic disease within the cervical spine. 5. Innumerable pulmonary nodules throughout the visualized lungs, consistent with metastatic disease. 6. Late  subacute to chronic compression fracture of the superior endplate of N39 with mild 30% height loss and 3 mm bony retropulsion. Multiple additional chronic  compression fractures as above. 7. Small to moderate bilateral pleural effusions, greater on the right. 8. 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 No other high-grade spinal stenosis or impingement. Electronically Signed   By: Jeannine Boga M.D.   On: 08/06/2021 19:22   MR THORACIC SPINE W WO CONTRAST  Result Date: 08/06/2021 CLINICAL DATA:  Follow-up examination for metastatic disease. EXAM: MRI CERVICAL AND THORACIC SPINE WITHOUT AND WITH CONTRAST TECHNIQUE: Multiplanar and multiecho pulse sequences of the cervical spine, to include the craniocervical junction and cervicothoracic junction, and the thoracic spine, were obtained without and with intravenous contrast. CONTRAST:  72mL GADAVIST GADOBUTROL 1 MMOL/ML IV SOLN COMPARISON:  Prior CT from earlier the same day as well as previous MRI from 05/30/2020. FINDINGS: MRI CERVICAL SPINE FINDINGS Alignment: Examination moderately to severely degraded by motion artifact. Vertebral bodies normally aligned with preservation of the normal cervical lordosis. No listhesis. Vertebrae: Metastatic disease involving the upper thoracic spine noted, discussed on corresponding thoracic portion of this exam. Otherwise, signal intensity within the visualized bone marrow of the cervical spine is within normal limits. No metastatic involvement within the cervical spine itself. No other discrete or worrisome osseous lesions. No other abnormal marrow edema or enhancement. Vertebral body height maintained without acute or chronic fracture. Cord: Signal intensity within the cervical spinal cord is grossly within normal limits. No appreciable cord signal abnormality or abnormal enhancement on this motion degraded exam. Posterior Fossa, vertebral arteries, paraspinal tissues: Probable chronic microvascular ischemic disease noted within the partially visualized pons and brainstem. Visualized brain and posterior  fossa otherwise unremarkable. Craniocervical junction within normal limits. Paraspinous soft tissues within normal limits. Normal flow voids seen within the vertebral arteries bilaterally. Disc levels: Mild for age degenerative disc desiccation with minimal disc bulging throughout the cervical spine. No significant spinal stenosis or frank cord impingement. Foramina appear grossly patent. MRI THORACIC SPINE FINDINGS Alignment: Examination moderately to severely degraded by motion artifact. Vertebral bodies normally aligned with preservation of the normal thoracic kyphosis. Trace anterolisthesis of T9 on T10, with trace retrolisthesis of T10 on T11 noted. Vertebrae: Abnormal T1 hypointense, stir hyperintense enhancing lesions seen involving the T1 and T2 vertebral bodies, consistent with metastatic disease. Associated mild height loss at these levels consistent with associated pathologic fractures, most pronounced at the T2 level where height loss measures up to approximately 30-40%. Epidural extension with tumor seen involving the right greater than left ventral epidural space at this level (series 21, image 14). Mild flattening and mass effect on the thoracic cord posteriorly with up to moderate spinal stenosis. No visible cord signal changes on this motion degraded exam. Lateral extension to involve the T1-2 neural foramina bilaterally which are largely obliterated. Additional extension into the left greater than right T2-3 neural foramina with mild to moderate bilateral foraminal stenosis. Additional extension to involve the posterior elements including the bilateral pedicles and posterior facets noted as well. Additional metastatic lesion involving the T6 vertebral body seen as well. Associated pathologic compression fracture with up to 50% height loss. Epidural extension of tumor with small volume tumor seen extending into the ventral epidural space (series 21, image 12). No more than mild spinal stenosis at  this level. No other metastatic disease seen within the thoracic spine. Late subacute to chronic compression fracture involving the superior endplate of J67 with  mild 30% height loss and 3 mm bony retropulsion noted. Multiple additional chronic compression fractures noted elsewhere throughout the thoracic spine, involving the T3, T5, T7, T8, and T10 vertebral bodies. Additional chronic compression fractures involving the upper lumbar spine at L1 through L3 noted. Sequelae of prior vertebral augmentation present at T7, T8, and T10. Cord: Signal intensity within the thoracic spinal cord is within normal limits. No appreciable cord signal changes on this motion degraded exam. Paraspinal and other soft tissues: Moderate right with small left layering pleural effusions. Innumerable pulmonary nodules seen throughout the visualized lungs, consistent with metastatic disease. Disc levels: Multilevel degenerative disc bulging seen throughout the thoracic spine extending from C5-6 through the visualized upper lumbar spine. Superimposed multilevel facet arthropathy. No other high-grade spinal stenosis. Neural foramina otherwise appear to be largely patent. IMPRESSION: 1. Motion degraded exam. 2. 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 Epidural extension with tumor extending into the ventral epidural space at these levels, resulting in up to moderate spinal stenosis and cord flattening. No visible cord signal changes on this motion degraded exam. 3. Additional metastatic lesion involving the T6 vertebral body with associated pathologic fracture and up to 50% height loss. Small volume ventral epidural extension of tumor with no more than mild spinal stenosis at this level. 4. No evidence for metastatic disease within the cervical spine. 5. Innumerable pulmonary nodules throughout the visualized lungs, consistent with metastatic disease. 6. Late subacute to chronic  compression fracture of the superior endplate of E56 with mild 30% height loss and 3 mm bony retropulsion. Multiple additional chronic compression fractures as above. 7. Small to moderate bilateral pleural effusions, greater on the right. 8. 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 No other high-grade spinal stenosis or impingement. Electronically Signed   By: Jeannine Boga M.D.   On: 08/06/2021 19:22   DG Femur Min 2 Views Left  Result Date: 08/06/2021 CLINICAL DATA:  Left lower extremity muscle spasms. EXAM: LEFT FEMUR 2 VIEWS COMPARISON:  None. FINDINGS: There is no evidence of fracture or other focal bone lesions. A small joint effusion is seen within the left knee. IMPRESSION: 1. No acute osseous abnormality. 2. Small left knee joint effusion. Electronically Signed   By: Virgina Norfolk M.D.   On: 08/06/2021 20:23   US Abdomen Limited RUQ (LIVER/GB)  Result Date: 08/06/2021 CLINICAL DATA:  Dilated CBD EXAM: ULTRASOUND ABDOMEN LIMITED RIGHT UPPER QUADRANT COMPARISON:  CT 08/06/2021 FINDINGS: Gallbladder: Sludge and stones within the gallbladder. Normal wall thickness. No sonographic Percell Miller indicated but pain medication given per history form. Common bile duct: Diameter: 15.5 mm Liver: Liver slightly echogenic. No focal hepatic abnormality. Portal vein is patent on color Doppler imaging with normal direction of blood flow towards the liver. Other: None. IMPRESSION: 1. Gallstones without sonographic evidence for acute cholecystitis 2. Dilated common bile duct up to 15.5 mm. Consider correlation with MRCP to assess for ductal obstruction. 3. Slightly echogenic liver suggesting steatosis Electronically Signed   By: Donavan Foil M.D.   On: 08/06/2021 22:31   Labs  Recent Labs    08/06/21 1630 08/07/21 0322  WBC 9.5 9.5  HGB 11.6* 12.3  HCT 38.3 40.5  PLT 269 270   Recent Labs    08/06/21 1630 08/07/21 0322  NA 140 138   K 4.0 4.0  CL 95* 94*  CO2 34* 31  BUN 15 14  CREATININE 1.07* 1.04*  GLUCOSE 99 132*  CALCIUM 9.1 9.5   Lab Results  Component Value Date   INR 1.04 06/08/2018   INR 1.03 05/18/2015        Physical Examination  Patient is a 66 y.o. year old female who is in obvious discomfort, mood is calm.  Orientation: oriented to person, place, time, and general circumstances  Vital Signs: BP (!) 101/55 (BP Location: Right Arm)    Pulse 92    Temp 97.9 F (36.6 C) (Oral)    Resp 17    SpO2 98%    Gait: Presently unable to ambulate due to pain  Heart: Normal rate Lungs: Non-labored breathing Abdomen: Soft, Non-tender   Right Upper Extremity: Inspection: Atraumatic Palpation: Diffuse tenderness ROM: Limited by pain Joint Stability: No instability Strength: Exam limited by pain but able to demonstrate at least 4+/5 biceps, wrist extensors, triceps, finger flexors, and dorsal interossei Skin: Intact Peripheral Vascular: Normal Reflexes: No pathologic Sensation: Intact C5-T1 Lymph Nodes: None Palpable Coordination: Intact   Left Upper Extremity: Inspection: Atraumatic Palpation: Diffuse tenderness ROM: Limited by pain Joint Stability: No instability Strength: Exam limited by pain but able to demonstrate at least 4+/5 biceps, wrist extensors, triceps, finger flexors, and dorsal interossei Skin: Intact Peripheral Vascular: Normal Reflexes: No pathologic Sensation: Diminished to light touch in C5, intact C6, diminished C7 and C8, intact T1 Lymph Nodes: None Palpable Coordination: Intact  Right Lower Extremity: Inspection: Atraumatic Palpation: Diffuse tenderness ROM: Limited by pain Joint Stability: No instability Strength: Exam limited by pain but able to demonstrate at least 4+/5 iliopsoas, quadriceps, tibialis anterior, EHL, and plantar flexion  Skin: Intact Peripheral Vascular: Normal Reflexes: No pathologic Sensation: Intact L2-S1 Lymph Nodes: None  Palpable Coordination: Intact   Left Lower Extremity: Inspection: Atraumatic Palpation: Diffuse tenderness ROM: Limited by pain Joint Stability: No instability Strength: Exam limited by pain but able to demonstrate at least 4+/5 iliopsoas, quadriceps, tibialis anterior, EHL, and plantar flexion  Skin: Intact Peripheral Vascular: Normal Reflexes: No pathologic Sensation: Intact L2-S1 Lymph Nodes: None Palpable Coordination: Intact  Pelvis: Skin: Intact Palpation: Diffuse tenderness and pain Stability: No instability      The review of the patient's medications does not in any way constitute an endorsement, by this clinician,  of their use, dosage, indications, route, efficacy, interactions, or other clinical parameters.  This note was generated within the [employer] EMR using Dragon medical speech recognition software and may contain inherent errors or omissions not intended by the user. Grammatical and punctuation errors, random word insertions, deletions, pronoun errors and incomplete sentences are occasional consequences of this technology due to software limitations. Not all errors are caught or corrected.  Although every attempt is made to root out erroneus and incomplete transcription, the note may still not fully represent the intent or opinion of the author. If there are questions or concerns about the content of this note or information contained within the body of this dictation they should be addressed directly with the author for clarification.

## 2021-08-08 DIAGNOSIS — C7949 Secondary malignant neoplasm of other parts of nervous system: Secondary | ICD-10-CM | POA: Diagnosis not present

## 2021-08-08 DIAGNOSIS — M8458XA Pathological fracture in neoplastic disease, other specified site, initial encounter for fracture: Secondary | ICD-10-CM | POA: Diagnosis not present

## 2021-08-08 DIAGNOSIS — M542 Cervicalgia: Secondary | ICD-10-CM | POA: Diagnosis not present

## 2021-08-08 LAB — COMPREHENSIVE METABOLIC PANEL
ALT: 19 U/L (ref 0–44)
AST: 19 U/L (ref 15–41)
Albumin: 3.1 g/dL — ABNORMAL LOW (ref 3.5–5.0)
Alkaline Phosphatase: 91 U/L (ref 38–126)
Anion gap: 10 (ref 5–15)
BUN: 20 mg/dL (ref 8–23)
CO2: 33 mmol/L — ABNORMAL HIGH (ref 22–32)
Calcium: 8.9 mg/dL (ref 8.9–10.3)
Chloride: 97 mmol/L — ABNORMAL LOW (ref 98–111)
Creatinine, Ser: 1.36 mg/dL — ABNORMAL HIGH (ref 0.44–1.00)
GFR, Estimated: 43 mL/min — ABNORMAL LOW (ref >60)
Glucose, Bld: 118 mg/dL — ABNORMAL HIGH (ref 70–99)
Potassium: 3.6 mmol/L (ref 3.5–5.1)
Sodium: 140 mmol/L (ref 135–145)
Total Bilirubin: 0.2 mg/dL — ABNORMAL LOW (ref 0.3–1.2)
Total Protein: 6.6 g/dL (ref 6.5–8.1)

## 2021-08-08 LAB — GLUCOSE, CAPILLARY
Glucose-Capillary: 122 mg/dL — ABNORMAL HIGH (ref 70–99)
Glucose-Capillary: 121 mg/dL — ABNORMAL HIGH (ref 70–99)
Glucose-Capillary: 146 mg/dL — ABNORMAL HIGH (ref 70–99)
Glucose-Capillary: 98 mg/dL (ref 70–99)
Glucose-Capillary: 115 mg/dL — ABNORMAL HIGH (ref 70–99)
Glucose-Capillary: 107 mg/dL — ABNORMAL HIGH (ref 70–99)

## 2021-08-08 LAB — CBC
HCT: 36.7 % (ref 36.0–46.0)
Hemoglobin: 11.6 g/dL — ABNORMAL LOW (ref 12.0–15.0)
MCH: 30.6 pg (ref 26.0–34.0)
MCHC: 31.6 g/dL (ref 30.0–36.0)
MCV: 96.8 fL (ref 80.0–100.0)
Platelets: 256 10*3/uL (ref 150–400)
RBC: 3.79 MIL/uL — ABNORMAL LOW (ref 3.87–5.11)
RDW: 14.1 % (ref 11.5–15.5)
WBC: 9.4 10*3/uL (ref 4.0–10.5)
nRBC: 0 % (ref 0.0–0.2)

## 2021-08-08 MED ORDER — POLYETHYLENE GLYCOL 3350 17 G PO PACK: 17.0000 g | PACK | Freq: Every day | ORAL | Status: DC | PRN

## 2021-08-08 NOTE — Progress Notes (Signed)
°  Transition of Care [facility]) Screening Note   Patient Details  Name: Theresa Fernandez Date of Birth: 1955-08-31   Transition of Care [facility]) CM/SW Contact:    Vinie Sill, LCSW Phone Number: 08/08/2021, 10:47 AM    Transition of Care Department [facility]) has reviewed patient and no TOC needs have been identified at this time. We will continue to monitor patient advancement through interdisciplinary progression rounds. If new patient transition needs arise, please place a TOC consult.

## 2021-08-08 NOTE — Progress Notes (Signed)
PROGRESS NOTE    Theresa Fernandez   LZJ:[id number]  DOB: 1956-03-05  DOA: 08/06/2021 PCP: [facility]   Brief Narrative:  Theresa Fernandez is an 66 y.o. 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) -I have spoken with both the patient and the husband who states that she has been offered immunotherapy but changes for resolution are slim - I have explained the concept of hospice and palliative care and feel that she may be a candidate for hospice as home - I have consulted palliative care to help her and her husband further navigate through this decision  Active Problems:   CKD (chronic kidney disease) stage 3, GFR 30-59 ml/min (HCC) -Follow intermittently    Depression/anxiety - Continue buspirone, Valium, Lamictal    Diabetes mellitus without complication ([place]) -Sugars have been quite stable - She is not on medication at home -A1c was 5.6 a year ago - Will DC sliding scale insulin    Chronic diastolic CHF (congestive heart failure) ([place]) -Last echo from 1/22 revealed an EF of 65 to 70% and grade 1 diastolic heart failure - Continue oral Lasix  COPD with chronic respiratory failure - Previously a smoker - No wheezing or exacerbation noted at this time  Time spent in minutes: 35 DVT prophylaxis: SCDs Start: 08/06/21 2148 Code Status: Full code Family Communication: Husband Level of Care: Level of care: Med-Surg Disposition Plan:  Status is: Inpatient  Remains  inpatient appropriate because: Needs consult with palliative care to further discuss plan      Consultants:  Palliative care Procedures:  None Antimicrobials:  Anti-infectives (From admission, onward)    Start     Dose/Rate Route Frequency Ordered Stop   08/06/21 2200  valACYclovir (VALTREX) tablet 1,000 mg        1,000 mg Oral 2 times daily 08/06/21 2148          Objective: Vitals:   08/07/21 2321 08/08/21 0400 08/08/21 0500 08/08/21 0725  BP: (!) 96/58 133/74  117/62  Pulse: 74 94  89  Resp: 20 20  15   Temp: 97.9 F (36.6 C) 98 F (36.7 C)  98.2 F (36.8 C)  TempSrc: Oral Oral  Oral  SpO2:  91%  90%  Weight:   117.8 kg     Intake/Output Summary (Last 24 hours) at 08/08/2021 1005 Last data filed at 08/08/2021 0240 Gross per 24 hour  Intake --  Output 1000 ml  Net -1000 ml   Filed Weights   08/08/21 0500  Weight: 117.8 kg    Examination: General exam: Appears comfortable  HEENT: PERRLA, oral mucosa moist, no sclera icterus or thrush Respiratory system: Clear to auscultation. Respiratory effort normal. Cardiovascular system: S1 & S2 heard, RRR.   Gastrointestinal system: Abdomen soft, non-tender, nondistended. Normal bowel sounds. Central nervous system: Alert and oriented. No focal neurological deficits. Extremities: No cyanosis, clubbing or edema Skin: No rashes or ulcers Psychiatry:  Mood & affect appropriate.     Data Reviewed: I have personally reviewed following labs and imaging studies  CBC: Recent Labs  Lab 08/06/21 1630 08/07/21 0322 08/08/21 0404  WBC 9.5 9.5 9.4  NEUTROABS 6.8  --   --   HGB 11.6* 12.3 11.6*  HCT 38.3 40.5 36.7  MCV 100.3* 99.8 96.8  PLT 269 270 975   Basic Metabolic Panel: Recent Labs  Lab 08/06/21 1630 08/07/21 0322 08/08/21 0404  NA 140 138 140  K 4.0 4.0 3.6  CL 95* 94* 97*  CO2 34* 31 33*  GLUCOSE 99 132* 118*  BUN 15 14 20   CREATININE 1.07* 1.04* 1.36*  CALCIUM 9.1 9.5 8.9   GFR: Estimated  Creatinine Clearance: 53.1 mL/min (A) (by C-G formula based on SCr of 1.36 mg/dL (H)). Liver Function Tests: Recent Labs  Lab 08/06/21 1630 08/08/21 0404  AST 19 19  ALT 15 19  ALKPHOS 90 91  BILITOT 0.5 0.2*  PROT 7.0 6.6  ALBUMIN 3.2* 3.1*   No results for input(s): LIPASE, AMYLASE in the last 168 hours. No results for input(s): AMMONIA in the last 168 hours. Coagulation Profile: No results for input(s): INR, PROTIME in the last 168 hours. Cardiac Enzymes: No results for input(s): CKTOTAL, CKMB, CKMBINDEX, TROPONINI in the last 168 hours. BNP (last 3 results) No results for input(s): PROBNP in the last 8760 hours. HbA1C: Recent Labs    08/07/21 0322  HGBA1C 6.0*   CBG: Recent Labs  Lab 08/07/21 1501 08/07/21 1951 08/07/21 2319 08/08/21 0403 08/08/21 0722  GLUCAP 128* 111* 116* 122* 121*   Lipid Profile: No results for input(s): CHOL, HDL, LDLCALC, TRIG, CHOLHDL, LDLDIRECT in the last 72 hours. Thyroid Function Tests: Recent Labs    08/06/21 1630  TSH 1.306   Anemia Panel: No results for input(s): VITAMINB12, FOLATE, FERRITIN, TIBC, IRON, RETICCTPCT in the last 72 hours. Urine analysis:    Component Value Date/Time   COLORURINE YELLOW 08/06/2021 2025   APPEARANCEUR CLOUDY (A) 08/06/2021 2025   LABSPEC 1.015 08/06/2021 2025   PHURINE 8.0 08/06/2021 2025   GLUCOSEU NEGATIVE 08/06/2021 2025   HGBUR NEGATIVE 08/06/2021 2025   BILIRUBINUR NEGATIVE 08/06/2021 2025   KETONESUR NEGATIVE 08/06/2021 2025   PROTEINUR NEGATIVE 08/06/2021 2025   UROBILINOGEN 0.2 03/06/2015 1205   NITRITE NEGATIVE 08/06/2021 2025   LEUKOCYTESUR LARGE (A) 08/06/2021 2025   Sepsis Labs: @LABRCNTIP (procalcitonin:4,lacticidven:4) ) Recent Results (from the past 240 hour(s))  Resp Panel by RT-PCR (Flu A&B, Covid) Nasopharyngeal Swab     Status: None   Collection Time: 08/06/21  4:06 PM   Specimen: Nasopharyngeal Swab; Nasopharyngeal(NP) swabs in vial transport medium  Result Value  Ref Range Status   SARS Coronavirus 2 by RT PCR NEGATIVE NEGATIVE Final    Comment: (NOTE) SARS-CoV-2 target nucleic acids are NOT DETECTED.  The SARS-CoV-2 RNA is generally detectable in upper respiratory specimens during the acute phase of infection. The lowest concentration of SARS-CoV-2 viral copies this assay can detect is 138 copies/mL. A negative result does not preclude SARS-Cov-2 infection and should not be used as the sole basis for treatment or other patient management decisions. A negative result may occur with  improper specimen collection/handling, submission of specimen other than nasopharyngeal swab, presence of viral mutation(s) within the areas targeted by this assay, and inadequate number of viral copies(<138 copies/mL). A negative result must be combined with clinical observations, patient history, and epidemiological information. The expected result is Negative.  Fact Sheet for Patients:  EntrepreneurPulse.com.au  Fact Sheet for Healthcare Providers:  IncredibleEmployment.be  This test is no t yet approved or cleared  by the [place] and  has been authorized for detection and/or diagnosis of SARS-CoV-2 by FDA under an Emergency Use Authorization (EUA). This EUA will remain  in effect (meaning this test can be used) for the duration of the COVID-19 declaration under Section 564(b)(1) of the Act, 21 U.S.C.section 360bbb-3(b)(1), unless the authorization is terminated  or revoked sooner.       Influenza A by PCR NEGATIVE NEGATIVE Final   Influenza B by PCR NEGATIVE NEGATIVE Final    Comment: (NOTE) The Xpert Xpress SARS-CoV-2/FLU/RSV plus assay is intended as an aid in the diagnosis of influenza from Nasopharyngeal swab specimens and should not be used as a sole basis for treatment. Nasal washings and aspirates are unacceptable for Xpert Xpress SARS-CoV-2/FLU/RSV testing.  Fact Sheet for  Patients: EntrepreneurPulse.com.au  Fact Sheet for Healthcare Providers: IncredibleEmployment.be  This test is not yet approved or cleared by the [place] FDA and has been authorized for detection and/or diagnosis of SARS-CoV-2 by FDA under an Emergency Use Authorization (EUA). This EUA will remain in effect (meaning this test can be used) for the duration of the COVID-19 declaration under Section 564(b)(1) of the Act, 21 U.S.C. section 360bbb-3(b)(1), unless the authorization is terminated or revoked.  Performed at [facility], [place] [address], [place], [place] [address]   MRSA Next Gen by PCR, Nasal     Status: None   Collection Time: 08/07/21 12:03 PM   Specimen: Nasal Mucosa; Nasal Swab  Result Value Ref Range Status   MRSA by PCR Next Gen NOT DETECTED NOT DETECTED Final    Comment: (NOTE) The GeneXpert MRSA Assay (FDA approved for NASAL specimens only), is one component of a comprehensive MRSA colonization surveillance program. It is not intended to diagnose MRSA infection nor to guide or monitor treatment for MRSA infections. Test performance is not FDA approved in patients less than 12 years old. Performed at [facility], [place] [address] [place], [place] [address]          Radiology Studies: CT Angio Chest PE W and/or Wo Contrast  Result Date: 08/06/2021 CLINICAL DATA:  Worsening shortness of breath, known cancer, pleuritic chest discomfort. EXAM: CT ANGIOGRAPHY CHEST WITH CONTRAST TECHNIQUE: Multidetector CT imaging of the chest was performed using the standard protocol during bolus administration of intravenous contrast. Multiplanar CT image reconstructions and MIPs were obtained to evaluate the vascular anatomy. RADIATION DOSE REDUCTION: This exam was performed according to the departmental dose-optimization program which includes automated exposure control, adjustment of the mA and/or kV according to patient size  and/or use of iterative reconstruction technique. CONTRAST:  35mL OMNIPAQUE IOHEXOL 350 MG/ML SOLN COMPARISON:  09/06/2019, 08/06/2021. FINDINGS: Cardiovascular: The heart is enlarged and no pericardial effusion is seen. Scattered coronary artery calcifications are noted. There is atherosclerotic calcification of the aorta without evidence of aneurysm. The pulmonary trunk is normal in caliber. No large central pulmonary artery filling defect. Evaluation of the pulmonary arteries is limited due to respiratory motion artifact. Mediastinum/Nodes: The thyroid gland, trachea, and esophagus are within normal limits. Enlarged lymph nodes are present in the mediastinum and right hilum. No axillary lymphadenopathy. Lungs/Pleura: Multiple pulmonary nodules and masses are seen bilaterally, the largest measuring 3.1 cm in the left lower lobe there is pleural thickening on the right with a small loculated pleural effusion and right lower lobe consolidation. No pneumothorax is seen bilaterally. Upper Abdomen: Visualized portion of the common bile duct is prominent in size at 1.2 cm. Musculoskeletal:  There is bony deformity of the proximal sternum suggesting old healed fracture. Mixed lytic and sclerotic lesions are present throughout the bones. There are compression deformities at T1, T2, T3, T5, T6, T7, T8, T9, T10, T12, and L1, better evaluated on recent MRI. Kyphoplasty changes are present at T10, T8, and T7 Review of the MIP images confirms the above findings. IMPRESSION: 1. No large central pulmonary embolus is identified. Evaluation of the pulmonary arteries is limited due to respiratory motion artifact. The need for short-term repeat evaluation should be determined clinically. 2. Innumerable pulmonary nodules and masses bilaterally measuring up to 3.1 cm, suspicious for metastatic disease. There is also pleural thickening with a small loculated pleural effusion on the right. Consolidation is noted at the right lung base  and the possibility of superimposed infection can not be excluded. 3. Multiple compression deformities, lytic and sclerotic lesions in the bones, and kyphoplasty changes in the thoracic spine, better evaluated on recent MRI. 4. Cardiomegaly with scattered coronary artery calcifications. 5. Aortic atherosclerosis. 6. Dilatation of the common bile duct measuring up to 1.2 cm. Ultrasound is suggested for further evaluation. Electronically Signed   By: Brett Fairy M.D.   On: 08/06/2021 20:31   CT Cervical Spine Wo Contrast  Result Date: 08/06/2021 CLINICAL DATA:  Acute neck pain EXAM: CT CERVICAL SPINE WITHOUT CONTRAST TECHNIQUE: Multidetector CT imaging of the cervical spine was performed without intravenous contrast. Multiplanar CT image reconstructions were also generated. RADIATION DOSE REDUCTION: This exam was performed according to the departmental dose-optimization program which includes automated exposure control, adjustment of the mA and/or kV according to patient size and/or use of iterative reconstruction technique. COMPARISON:  CT neck 11/01/2020 FINDINGS: Alignment: No acute subluxation. Skull base and vertebrae: Bony destructive lesion again seen at the anterior left mastoid air cells with associated adjacent irregular soft tissue mass density. The soft tissue mass appears decreased in size since previous study. No acute vertebral body fracture identified. Diffuse heterogeneous lucent appearance of the vertebral bodies especially in the lower cervical and upper thoracic spine, suggesting metastatic disease. Associated apparent soft tissue density extending dorsally into the spinal canal from C4 through C7. Soft tissues and spinal canal: No prevertebral soft tissue edema. No evidence of visible canal hematoma. Disc levels: Mild intervertebral disc space narrowing throughout the cervical spine. Upper chest: Multiple metastatic pulmonary nodules are visualized in the upper lungs which are increased  since previous study measuring up to 2 cm near the left apex. Prominent superior mediastinal lymph nodes. Other: None. IMPRESSION: 1. Extensive osseous metastatic disease especially in the lower cervical and visualized upper thoracic vertebral bodies. Associated apparent soft tissue density extending dorsally into the spinal canal from C4 through C7, limited visualization with CT. Correlate clinically and consider follow-up MRI with contrast to evaluate degree of spinal canal stenoses and possible cord compression if indicated. 2. Multiple metastatic pulmonary nodules which are increased in size since previous study. 3. Redemonstration and slightly decreased size of an invasive irregular mass deep to the left parotid with destruction and invasion into the left mastoids as seen previously. Electronically Signed   By: Ofilia Neas M.D.   On: 08/06/2021 12:59   MR Cervical Spine W or Wo Contrast  Result Date: 08/06/2021 CLINICAL DATA:  Follow-up examination for metastatic disease. EXAM: MRI CERVICAL AND THORACIC SPINE WITHOUT AND WITH CONTRAST TECHNIQUE: Multiplanar and multiecho pulse sequences of the cervical spine, to include the craniocervical junction and cervicothoracic junction, and the thoracic spine, were obtained without and  with intravenous contrast. CONTRAST:  32mL GADAVIST GADOBUTROL 1 MMOL/ML IV SOLN COMPARISON:  Prior CT from earlier the same day as well as previous MRI from 05/30/2020. FINDINGS: MRI CERVICAL SPINE FINDINGS Alignment: Examination moderately to severely degraded by motion artifact. Vertebral bodies normally aligned with preservation of the normal cervical lordosis. No listhesis. Vertebrae: Metastatic disease involving the upper thoracic spine noted, discussed on corresponding thoracic portion of this exam. Otherwise, signal intensity within the visualized bone marrow of the cervical spine is within normal limits. No metastatic involvement within the cervical spine itself. No  other discrete or worrisome osseous lesions. No other abnormal marrow edema or enhancement. Vertebral body height maintained without acute or chronic fracture. Cord: Signal intensity within the cervical spinal cord is grossly within normal limits. No appreciable cord signal abnormality or abnormal enhancement on this motion degraded exam. Posterior Fossa, vertebral arteries, paraspinal tissues: Probable chronic microvascular ischemic disease noted within the partially visualized pons and brainstem. Visualized brain and posterior fossa otherwise unremarkable. Craniocervical junction within normal limits. Paraspinous soft tissues within normal limits. Normal flow voids seen within the vertebral arteries bilaterally. Disc levels: Mild for age degenerative disc desiccation with minimal disc bulging throughout the cervical spine. No significant spinal stenosis or frank cord impingement. Foramina appear grossly patent. MRI THORACIC SPINE FINDINGS Alignment: Examination moderately to severely degraded by motion artifact. Vertebral bodies normally aligned with preservation of the normal thoracic kyphosis. Trace anterolisthesis of T9 on T10, with trace retrolisthesis of T10 on T11 noted. Vertebrae: Abnormal T1 hypointense, stir hyperintense enhancing lesions seen involving the T1 and T2 vertebral bodies, consistent with metastatic disease. Associated mild height loss at these levels consistent with associated pathologic fractures, most pronounced at the T2 level where height loss measures up to approximately 30-40%. Epidural extension with tumor seen involving the right greater than left ventral epidural space at this level (series 21, image 14). Mild flattening and mass effect on the thoracic cord posteriorly with up to moderate spinal stenosis. No visible cord signal changes on this motion degraded exam. Lateral extension to involve the T1-2 neural foramina bilaterally which are largely obliterated. Additional extension  into the left greater than right T2-3 neural foramina with mild to moderate bilateral foraminal stenosis. Additional extension to involve the posterior elements including the bilateral pedicles and posterior facets noted as well. Additional metastatic lesion involving the T6 vertebral body seen as well. Associated pathologic compression fracture with up to 50% height loss. Epidural extension of tumor with small volume tumor seen extending into the ventral epidural space (series 21, image 12). No more than mild spinal stenosis at this level. No other metastatic disease seen within the thoracic spine. Late subacute to chronic compression fracture involving the superior endplate of C94 with mild 30% height loss and 3 mm bony retropulsion noted. Multiple additional chronic compression fractures noted elsewhere throughout the thoracic spine, involving the T3, T5, T7, T8, and T10 vertebral bodies. Additional chronic compression fractures involving the upper lumbar spine at L1 through L3 noted. Sequelae of prior vertebral augmentation present at T7, T8, and T10. Cord: Signal intensity within the thoracic spinal cord is within normal limits. No appreciable cord signal changes on this motion degraded exam. Paraspinal and other soft tissues: Moderate right with small left layering pleural effusions. Innumerable pulmonary nodules seen throughout the visualized lungs, consistent with metastatic disease. Disc levels: Multilevel degenerative disc bulging seen throughout the thoracic spine extending from C5-6 through the visualized upper lumbar spine. Superimposed multilevel facet arthropathy. No other  high-grade spinal stenosis. Neural foramina otherwise appear to be largely patent. IMPRESSION: 1. Motion degraded exam. 2. 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 Epidural extension with tumor extending into the ventral epidural space at these levels, resulting in up  to moderate spinal stenosis and cord flattening. No visible cord signal changes on this motion degraded exam. 3. Additional metastatic lesion involving the T6 vertebral body with associated pathologic fracture and up to 50% height loss. Small volume ventral epidural extension of tumor with no more than mild spinal stenosis at this level. 4. No evidence for metastatic disease within the cervical spine. 5. Innumerable pulmonary nodules throughout the visualized lungs, consistent with metastatic disease. 6. Late subacute to chronic compression fracture of the superior endplate of K48 with mild 30% height loss and 3 mm bony retropulsion. Multiple additional chronic compression fractures as above. 7. Small to moderate bilateral pleural effusions, greater on the right. 8. 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 No other high-grade spinal stenosis or impingement. Electronically Signed   By: Jeannine Boga M.D.   On: 08/06/2021 19:22   MR THORACIC SPINE W WO CONTRAST  Result Date: 08/06/2021 CLINICAL DATA:  Follow-up examination for metastatic disease. EXAM: MRI CERVICAL AND THORACIC SPINE WITHOUT AND WITH CONTRAST TECHNIQUE: Multiplanar and multiecho pulse sequences of the cervical spine, to include the craniocervical junction and cervicothoracic junction, and the thoracic spine, were obtained without and with intravenous contrast. CONTRAST:  72mL GADAVIST GADOBUTROL 1 MMOL/ML IV SOLN COMPARISON:  Prior CT from earlier the same day as well as previous MRI from 05/30/2020. FINDINGS: MRI CERVICAL SPINE FINDINGS Alignment: Examination moderately to severely degraded by motion artifact. Vertebral bodies normally aligned with preservation of the normal cervical lordosis. No listhesis. Vertebrae: Metastatic disease involving the upper thoracic spine noted, discussed on corresponding thoracic portion of this exam. Otherwise, signal intensity within the  visualized bone marrow of the cervical spine is within normal limits. No metastatic involvement within the cervical spine itself. No other discrete or worrisome osseous lesions. No other abnormal marrow edema or enhancement. Vertebral body height maintained without acute or chronic fracture. Cord: Signal intensity within the cervical spinal cord is grossly within normal limits. No appreciable cord signal abnormality or abnormal enhancement on this motion degraded exam. Posterior Fossa, vertebral arteries, paraspinal tissues: Probable chronic microvascular ischemic disease noted within the partially visualized pons and brainstem. Visualized brain and posterior fossa otherwise unremarkable. Craniocervical junction within normal limits. Paraspinous soft tissues within normal limits. Normal flow voids seen within the vertebral arteries bilaterally. Disc levels: Mild for age degenerative disc desiccation with minimal disc bulging throughout the cervical spine. No significant spinal stenosis or frank cord impingement. Foramina appear grossly patent. MRI THORACIC SPINE FINDINGS Alignment: Examination moderately to severely degraded by motion artifact. Vertebral bodies normally aligned with preservation of the normal thoracic kyphosis. Trace anterolisthesis of T9 on T10, with trace retrolisthesis of T10 on T11 noted. Vertebrae: Abnormal T1 hypointense, stir hyperintense enhancing lesions seen involving the T1 and T2 vertebral bodies, consistent with metastatic disease. Associated mild height loss at these levels consistent with associated pathologic fractures, most pronounced at the T2 level where height loss measures up to approximately 30-40%. Epidural extension with tumor seen involving the right greater than left ventral epidural space at this level (series 21, image 14). Mild flattening and mass effect on the thoracic cord posteriorly with up to moderate spinal stenosis. No visible cord signal changes on  this motion  degraded exam. Lateral extension to involve the T1-2 neural foramina bilaterally which are largely obliterated. Additional extension into the left greater than right T2-3 neural foramina with mild to moderate bilateral foraminal stenosis. Additional extension to involve the posterior elements including the bilateral pedicles and posterior facets noted as well. Additional metastatic lesion involving the T6 vertebral body seen as well. Associated pathologic compression fracture with up to 50% height loss. Epidural extension of tumor with small volume tumor seen extending into the ventral epidural space (series 21, image 12). No more than mild spinal stenosis at this level. No other metastatic disease seen within the thoracic spine. Late subacute to chronic compression fracture involving the superior endplate of Z02 with mild 30% height loss and 3 mm bony retropulsion noted. Multiple additional chronic compression fractures noted elsewhere throughout the thoracic spine, involving the T3, T5, T7, T8, and T10 vertebral bodies. Additional chronic compression fractures involving the upper lumbar spine at L1 through L3 noted. Sequelae of prior vertebral augmentation present at T7, T8, and T10. Cord: Signal intensity within the thoracic spinal cord is within normal limits. No appreciable cord signal changes on this motion degraded exam. Paraspinal and other soft tissues: Moderate right with small left layering pleural effusions. Innumerable pulmonary nodules seen throughout the visualized lungs, consistent with metastatic disease. Disc levels: Multilevel degenerative disc bulging seen throughout the thoracic spine extending from C5-6 through the visualized upper lumbar spine. Superimposed multilevel facet arthropathy. No other high-grade spinal stenosis. Neural foramina otherwise appear to be largely patent. IMPRESSION: 1. Motion degraded exam. 2. 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 Epidural extension with tumor extending into the ventral epidural space at these levels, resulting in up to moderate spinal stenosis and cord flattening. No visible cord signal changes on this motion degraded exam. 3. Additional metastatic lesion involving the T6 vertebral body with associated pathologic fracture and up to 50% height loss. Small volume ventral epidural extension of tumor with no more than mild spinal stenosis at this level. 4. No evidence for metastatic disease within the cervical spine. 5. Innumerable pulmonary nodules throughout the visualized lungs, consistent with metastatic disease. 6. Late subacute to chronic compression fracture of the superior endplate of H85 with mild 30% height loss and 3 mm bony retropulsion. Multiple additional chronic compression fractures as above. 7. Small to moderate bilateral pleural effusions, greater on the right. 8. 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 No other high-grade spinal stenosis or impingement. Electronically Signed   By: Jeannine Boga M.D.   On: 08/06/2021 19:22   DG Femur Min 2 Views Left  Result Date: 08/06/2021 CLINICAL DATA:  Left lower extremity muscle spasms. EXAM: LEFT FEMUR 2 VIEWS COMPARISON:  None. FINDINGS: There is no evidence of fracture or other focal bone lesions. A small joint effusion is seen within the left knee. IMPRESSION: 1. No acute osseous abnormality. 2. Small left knee joint effusion. Electronically Signed   By: Virgina Norfolk M.D.   On: 08/06/2021 20:23   US Abdomen Limited RUQ (LIVER/GB)  Result Date: 08/06/2021 CLINICAL DATA:  Dilated CBD EXAM: ULTRASOUND ABDOMEN LIMITED RIGHT UPPER QUADRANT COMPARISON:  CT 08/06/2021 FINDINGS: Gallbladder: Sludge and stones within the gallbladder. Normal wall thickness. No sonographic Percell Miller indicated but pain medication given per history form. Common bile duct: Diameter:  15.5 mm Liver: Liver slightly echogenic. No focal hepatic abnormality. Portal vein is patent on color Doppler imaging with normal  direction of blood flow towards the liver. Other: None. IMPRESSION: 1. Gallstones without sonographic evidence for acute cholecystitis 2. Dilated common bile duct up to 15.5 mm. Consider correlation with MRCP to assess for ductal obstruction. 3. Slightly echogenic liver suggesting steatosis Electronically Signed   By: Donavan Foil M.D.   On: 08/06/2021 22:31      Scheduled Meds:  acetaminophen  650 mg Oral Q6H   busPIRone  30 mg Oral QHS   fluticasone furoate-vilanterol  1 puff Inhalation Daily   furosemide  40 mg Oral Daily   insulin aspart  0-6 Units Subcutaneous Q4H   lamoTRIgine  25 mg Oral QHS   pantoprazole  40 mg Oral BID   pravastatin  80 mg Oral q1800   prazosin  5 mg Oral QHS   pregabalin  150 mg Oral BID   sertraline  200 mg Oral QHS   umeclidinium bromide  1 puff Inhalation Daily   valACYclovir  1,000 mg Oral BID   Continuous Infusions:   LOS: 2 days      Debbe Odea, MD [facility] Pager: [email or web address] 08/08/2021, 10:05 AM

## 2021-08-08 NOTE — Consult Note (Signed)
Reason for Consult: Thoracic lesions Referring Physician: Dr. Jenene Slicker is an 66 y.o. female.  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 She states that she has an oncologist at [facility]. She has been walking with a walker. She describes pain in the feet and left arm and left hand. Arm pain has been present for about a month. She also has a stabbing sensation in the region of her shoulder blades for about the past 3 months. The back pain is minimal to moderate and has been the same since her admission.   Past Medical History:  Diagnosis Date   Allergy    Anxiety    Arthritis    CHF (congestive heart failure) (HCC)    COPD (chronic obstructive pulmonary disease) ([facility])    Depression    Diabetes mellitus without complication ([facility])    HTN (hypertension) 01/12/2013   pt denies    Past Surgical History:  Procedure Laterality Date   BIOPSY  08/01/2020   Procedure: BIOPSY;  Surgeon: Milus Banister, MD;  Location: [facility] ENDOSCOPY;  Service: Endoscopy;;   ESOPHAGOGASTRODUODENOSCOPY (EGD) WITH PROPOFOL N/A 08/01/2020   Procedure: ESOPHAGOGASTRODUODENOSCOPY (EGD) WITH PROPOFOL;  Surgeon: Milus Banister, MD;  Location: [facility] ENDOSCOPY;  Service: Endoscopy;  Laterality: N/A;   MASTOIDECTOMY     TONSILLECTOMY     TUBAL LIGATION      Family History  Problem Relation Age of Onset   Diabetes Mother    Alcohol abuse Father    Diabetes Father    Stroke Brother     Social History:  reports that she quit smoking about 3 years ago. Her smoking use included cigarettes. She has a 150.00 pack-year smoking history. She has never used smokeless tobacco. She reports that she does not drink alcohol and does not use drugs.  Allergies:  Allergies  Allergen Reactions   Ceftriaxone Hives and Rash    After second dose on 06/07/2018,   Topamax [Topiramate] Other (See Comments)    Made headache worse   Capsaicin Rash   Voltaren  [Diclofenac Sodium] Other (See Comments)    Caused migraine    Medications: I have reviewed the patient's current medications.  Results for orders placed or performed during the hospital encounter of 08/06/21 (from the past 48 hour(s))  Resp Panel by RT-PCR (Flu A&B, Covid) Nasopharyngeal Swab     Status: None   Collection Time: 08/06/21  4:06 PM   Specimen: Nasopharyngeal Swab; Nasopharyngeal(NP) swabs in vial transport medium  Result Value Ref Range   SARS Coronavirus 2 by RT PCR NEGATIVE NEGATIVE    Comment: (NOTE) SARS-CoV-2 target nucleic acids are NOT DETECTED.  The SARS-CoV-2 RNA is generally detectable in upper respiratory specimens during the acute phase of infection. The lowest concentration of SARS-CoV-2 viral copies this assay can detect is 138 copies/mL. A negative result does not preclude SARS-Cov-2 infection and should not be used as the sole basis for treatment or other patient management decisions. A negative result may occur with  improper specimen collection/handling, submission of specimen other than nasopharyngeal swab, presence of viral mutation(s) within the areas targeted by this assay, and inadequate number of viral copies(<138 copies/mL). A negative result must be combined with clinical observations, patient history, and epidemiological information. The expected result is Negative.  Fact Sheet for Patients:  EntrepreneurPulse.com.au  Fact Sheet for Healthcare Providers:  IncredibleEmployment.be  This test is no t yet approved or cleared by  the [employer] and  has been authorized for detection and/or diagnosis of SARS-CoV-2 by FDA under an Emergency Use Authorization (EUA). This EUA will remain  in effect (meaning this test can be used) for the duration of the COVID-19 declaration under Section 564(b)(1) of the Act, 21 U.S.C.section 360bbb-3(b)(1), unless the authorization is terminated  or revoked sooner.        Influenza A by PCR NEGATIVE NEGATIVE   Influenza B by PCR NEGATIVE NEGATIVE    Comment: (NOTE) The Xpert Xpress SARS-CoV-2/FLU/RSV plus assay is intended as an aid in the diagnosis of influenza from Nasopharyngeal swab specimens and should not be used as a sole basis for treatment. Nasal washings and aspirates are unacceptable for Xpert Xpress SARS-CoV-2/FLU/RSV testing.  Fact Sheet for Patients: EntrepreneurPulse.com.au  Fact Sheet for Healthcare Providers: IncredibleEmployment.be  This test is not yet approved or cleared by the [place] FDA and has been authorized for detection and/or diagnosis of SARS-CoV-2 by FDA under an Emergency Use Authorization (EUA). This EUA will remain in effect (meaning this test can be used) for the duration of the COVID-19 declaration under Section 564(b)(1) of the Act, 21 U.S.C. section 360bbb-3(b)(1), unless the authorization is terminated or revoked.  Performed at [facility], [place] [address], [place], [place] [address]   CBC with Differential     Status: Abnormal   Collection Time: 08/06/21  4:30 PM  Result Value Ref Range   WBC 9.5 4.0 - 10.5 K/uL   RBC 3.82 (L) 3.87 - 5.11 MIL/uL   Hemoglobin 11.6 (L) 12.0 - 15.0 g/dL   HCT 38.3 36.0 - 46.0 %   MCV 100.3 (H) 80.0 - 100.0 fL   MCH 30.4 26.0 - 34.0 pg   MCHC 30.3 30.0 - 36.0 g/dL   RDW 14.5 11.5 - 15.5 %   Platelets 269 150 - 400 K/uL   nRBC 0.0 0.0 - 0.2 %   Neutrophils Relative % 71 %   Neutro Abs 6.8 1.7 - 7.7 K/uL   Lymphocytes Relative 17 %   Lymphs Abs 1.6 0.7 - 4.0 K/uL   Monocytes Relative 8 %   Monocytes Absolute 0.7 0.1 - 1.0 K/uL   Eosinophils Relative 3 %   Eosinophils Absolute 0.3 0.0 - 0.5 K/uL   Basophils Relative 0 %   Basophils Absolute 0.0 0.0 - 0.1 K/uL   Immature Granulocytes 1 %   Abs Immature Granulocytes 0.05 0.00 - 0.07 K/uL    Comment: Performed at [facility] [address] [place],  [place] [address]  Comprehensive metabolic panel     Status: Abnormal   Collection Time: 08/06/21  4:30 PM  Result Value Ref Range   Sodium 140 135 - 145 mmol/L   Potassium 4.0 3.5 - 5.1 mmol/L   Chloride 95 (L) 98 - 111 mmol/L   CO2 34 (H) 22 - 32 mmol/L   Glucose, Bld 99 70 - 99 mg/dL    Comment: Glucose reference range applies only to samples taken after fasting for at least 8 hours.   BUN 15 8 - 23 mg/dL   Creatinine, Ser 1.07 (H) 0.44 - 1.00 mg/dL   Calcium 9.1 8.9 - 10.3 mg/dL   Total Protein 7.0 6.5 - 8.1 g/dL   Albumin 3.2 (L) 3.5 - 5.0 g/dL   AST 19 15 - 41 U/L   ALT 15 0 - 44 U/L   Alkaline Phosphatase 90 38 - 126 U/L   Total Bilirubin 0.5  0.3 - 1.2 mg/dL   GFR, Estimated 57 (L) >60 mL/min    Comment: (NOTE) Calculated using the CKD-EPI Creatinine Equation (2021)    Anion gap 11 5 - 15    Comment: Performed at [facility] [address] [place], [place] [address]  Lactic acid, plasma     Status: None   Collection Time: 08/06/21  4:30 PM  Result Value Ref Range   Lactic Acid, Venous 0.8 0.5 - 1.9 mmol/L    Comment: Performed at [facility] [address] [place], [place] [address]  Brain natriuretic peptide     Status: None   Collection Time: 08/06/21  4:30 PM  Result Value Ref Range   B Natriuretic Peptide 20.6 0.0 - 100.0 pg/mL    Comment: Performed at [facility] [address] [place], [place] [address]  Troponin I (High Sensitivity)     Status: None   Collection Time: 08/06/21  4:30 PM  Result Value Ref Range   Troponin I (High Sensitivity) 8 <18 ng/L    Comment: (NOTE) Elevated high sensitivity troponin I (hsTnI) values and significant  changes across serial measurements may suggest ACS but many other  chronic and acute conditions are known to elevate hsTnI results.  Refer to the "Links" section for chest pain algorithms and additional  guidance. Performed at [facility], [place] [address] [place], [place] [address]   TSH      Status: None   Collection Time: 08/06/21  4:30 PM  Result Value Ref Range   TSH 1.306 0.350 - 4.500 uIU/mL    Comment: Performed by a 3rd Generation assay with a functional sensitivity of <=0.01 uIU/mL. Performed at [facility], [place] [address] [place], [place] [address]   Urinalysis, Routine w reflex microscopic     Status: Abnormal   Collection Time: 08/06/21  8:25 PM  Result Value Ref Range   Color, Urine YELLOW YELLOW   APPearance CLOUDY (A) CLEAR   Specific Gravity, Urine 1.015 1.005 - 1.030   pH 8.0 5.0 - 8.0   Glucose, UA NEGATIVE NEGATIVE mg/dL   Hgb urine dipstick NEGATIVE NEGATIVE   Bilirubin Urine NEGATIVE NEGATIVE   Ketones, ur NEGATIVE NEGATIVE mg/dL   Protein, ur NEGATIVE NEGATIVE mg/dL   Nitrite NEGATIVE NEGATIVE   Leukocytes,Ua LARGE (A) NEGATIVE    Comment: Performed at [facility] [address] [place], [place] [address]  Urinalysis, Microscopic (reflex)     Status: Abnormal   Collection Time: 08/06/21  8:25 PM  Result Value Ref Range   RBC / HPF 0-5 0 - 5 RBC/hpf   WBC, UA >50 0 - 5 WBC/hpf   Bacteria, UA FEW (A) NONE SEEN   Squamous Epithelial / LPF 11-20 0 - 5   Amorphous Crystal PRESENT     Comment: Performed at [facility], [place] [address], [place], [place] [address]  CBG monitoring, ED     Status: Abnormal   Collection Time: 08/07/21 12:26 AM  Result Value Ref Range   Glucose-Capillary 131 (H) 70 - 99 mg/dL    Comment: Glucose reference range applies only to samples taken after fasting for at least 8 hours.  Lactic acid, plasma     Status: None   Collection Time: 08/07/21  3:21 AM  Result Value Ref Range   Lactic Acid, Venous 0.8 0.5 - 1.9 mmol/L    Comment: Performed at [facility], [address] [place], [place] [address]  Hemoglobin A1c     Status: Abnormal   Collection Time: 08/07/21  3:22 AM  Result Value Ref Range   Hgb A1c MFr Bld 6.0 (H) 4.8 - 5.6 %    Comment: (NOTE) Pre diabetes:           5.7%-6.4%  Diabetes:              >6.4%  Glycemic control for   <7.0% adults with diabetes    Mean Plasma Glucose 125.5 mg/dL    Comment: Performed at [facility], [place] [address] [place], [place] [address]  HIV Antibody (routine testing w rflx)     Status: None   Collection Time: 08/07/21  3:22 AM  Result Value Ref Range   HIV Screen 4th Generation wRfx Non Reactive Non Reactive    Comment: Performed at [facility], [place] [address], [place], [place] [address]  Basic metabolic panel     Status: Abnormal   Collection Time: 08/07/21  3:22 AM  Result Value Ref Range   Sodium 138 135 - 145 mmol/L   Potassium 4.0 3.5 - 5.1 mmol/L   Chloride 94 (L) 98 - 111 mmol/L   CO2 31 22 - 32 mmol/L   Glucose, Bld 132 (H) 70 - 99 mg/dL    Comment: Glucose reference range applies only to samples taken after fasting for at least 8 hours.   BUN 14 8 - 23 mg/dL   Creatinine, Ser 1.04 (H) 0.44 - 1.00 mg/dL   Calcium 9.5 8.9 - 10.3 mg/dL   GFR, Estimated 59 (L) >60 mL/min    Comment: (NOTE) Calculated using the CKD-EPI Creatinine Equation (2021)    Anion gap 13 5 - 15    Comment: Performed at [facility] [address] [place] [address]  CBC     Status: None   Collection Time: 08/07/21  3:22 AM  Result Value Ref Range   WBC 9.5 4.0 - 10.5 K/uL   RBC 4.06 3.87 - 5.11 MIL/uL   Hemoglobin 12.3 12.0 - 15.0 g/dL   HCT 40.5 36.0 - 46.0 %   MCV 99.8 80.0 - 100.0 fL   MCH 30.3 26.0 - 34.0 pg   MCHC 30.4 30.0 - 36.0 g/dL   RDW 14.4 11.5 - 15.5 %   Platelets 270 150 - 400 K/uL   nRBC 0.0 0.0 - 0.2 %    Comment: Performed at [facility], [place] [address], [place], [place] [address]  Troponin I (High Sensitivity)     Status: None   Collection Time: 08/07/21  3:23 AM  Result Value Ref Range   Troponin I (High Sensitivity) 10 <18 ng/L    Comment: (NOTE) Elevated high sensitivity troponin I (hsTnI) values and significant  changes across serial measurements may suggest  ACS but many other  chronic and acute conditions are known to elevate hsTnI results.  Refer to the "Links" section for chest pain algorithms and additional  guidance. Performed at [facility], [place] [address] [place], [place] [address]   CBG monitoring, ED     Status: Abnormal   Collection Time: 08/07/21  6:28 AM  Result Value Ref Range   Glucose-Capillary 118 (H) 70 - 99 mg/dL    Comment: Glucose reference range applies only to samples taken after fasting for at least 8 hours.  CBG monitoring, ED     Status: Abnormal   Collection  Time: 08/07/21  7:57 AM  Result Value Ref Range   Glucose-Capillary 113 (H) 70 - 99 mg/dL    Comment: Glucose reference range applies only to samples taken after fasting for at least 8 hours.  MRSA Next Gen by PCR, Nasal     Status: None   Collection Time: 08/07/21 12:03 PM   Specimen: Nasal Mucosa; Nasal Swab  Result Value Ref Range   MRSA by PCR Next Gen NOT DETECTED NOT DETECTED    Comment: (NOTE) The GeneXpert MRSA Assay (FDA approved for NASAL specimens only), is one component of a comprehensive MRSA colonization surveillance program. It is not intended to diagnose MRSA infection nor to guide or monitor treatment for MRSA infections. Test performance is not FDA approved in patients less than 59 years old. Performed at [facility], [place] [address] [place], [place] [address]   Glucose, capillary     Status: Abnormal   Collection Time: 08/07/21  3:01 PM  Result Value Ref Range   Glucose-Capillary 128 (H) 70 - 99 mg/dL    Comment: Glucose reference range applies only to samples taken after fasting for at least 8 hours.   Comment 1 Notify RN    Comment 2 Document in Chart   Glucose, capillary     Status: Abnormal   Collection Time: 08/07/21  7:51 PM  Result Value Ref Range   Glucose-Capillary 111 (H) 70 - 99 mg/dL    Comment: Glucose reference range applies only to samples taken after fasting for at least 8 hours.  Glucose, capillary      Status: Abnormal   Collection Time: 08/07/21 11:19 PM  Result Value Ref Range   Glucose-Capillary 116 (H) 70 - 99 mg/dL    Comment: Glucose reference range applies only to samples taken after fasting for at least 8 hours.  Glucose, capillary     Status: Abnormal   Collection Time: 08/08/21  4:03 AM  Result Value Ref Range   Glucose-Capillary 122 (H) 70 - 99 mg/dL    Comment: Glucose reference range applies only to samples taken after fasting for at least 8 hours.  CBC     Status: Abnormal   Collection Time: 08/08/21  4:04 AM  Result Value Ref Range   WBC 9.4 4.0 - 10.5 K/uL   RBC 3.79 (L) 3.87 - 5.11 MIL/uL   Hemoglobin 11.6 (L) 12.0 - 15.0 g/dL   HCT 36.7 36.0 - 46.0 %   MCV 96.8 80.0 - 100.0 fL   MCH 30.6 26.0 - 34.0 pg   MCHC 31.6 30.0 - 36.0 g/dL   RDW 14.1 11.5 - 15.5 %   Platelets 256 150 - 400 K/uL   nRBC 0.0 0.0 - 0.2 %    Comment: Performed at [facility], [place] [address], [place], [place] [address]  Comprehensive metabolic panel     Status: Abnormal   Collection Time: 08/08/21  4:04 AM  Result Value Ref Range   Sodium 140 135 - 145 mmol/L   Potassium 3.6 3.5 - 5.1 mmol/L   Chloride 97 (L) 98 - 111 mmol/L   CO2 33 (H) 22 - 32 mmol/L   Glucose, Bld 118 (H) 70 - 99 mg/dL    Comment: Glucose reference range applies only to samples taken after fasting for at least 8 hours.   BUN 20 8 - 23 mg/dL   Creatinine, Ser 1.36 (H) 0.44 - 1.00 mg/dL   Calcium 8.9 8.9 - 10.3 mg/dL   Total  Protein 6.6 6.5 - 8.1 g/dL   Albumin 3.1 (L) 3.5 - 5.0 g/dL   AST 19 15 - 41 U/L   ALT 19 0 - 44 U/L   Alkaline Phosphatase 91 38 - 126 U/L   Total Bilirubin 0.2 (L) 0.3 - 1.2 mg/dL   GFR, Estimated 43 (L) >60 mL/min    Comment: (NOTE) Calculated using the CKD-EPI Creatinine Equation (2021)    Anion gap 10 5 - 15    Comment: Performed at [facility] [address], [place], [place] [address]  Glucose, capillary     Status: Abnormal   Collection Time: 08/08/21  7:22 AM   Result Value Ref Range   Glucose-Capillary 121 (H) 70 - 99 mg/dL    Comment: Glucose reference range applies only to samples taken after fasting for at least 8 hours.  Glucose, capillary     Status: Abnormal   Collection Time: 08/08/21 11:31 AM  Result Value Ref Range   Glucose-Capillary 146 (H) 70 - 99 mg/dL    Comment: Glucose reference range applies only to samples taken after fasting for at least 8 hours.    CT Angio Chest PE W and/or Wo Contrast  Result Date: 08/06/2021 CLINICAL DATA:  Worsening shortness of breath, known cancer, pleuritic chest discomfort. EXAM: CT ANGIOGRAPHY CHEST WITH CONTRAST TECHNIQUE: Multidetector CT imaging of the chest was performed using the standard protocol during bolus administration of intravenous contrast. Multiplanar CT image reconstructions and MIPs were obtained to evaluate the vascular anatomy. RADIATION DOSE REDUCTION: This exam was performed according to the departmental dose-optimization program which includes automated exposure control, adjustment of the mA and/or kV according to patient size and/or use of iterative reconstruction technique. CONTRAST:  23mL OMNIPAQUE IOHEXOL 350 MG/ML SOLN COMPARISON:  09/06/2019, 08/06/2021. FINDINGS: Cardiovascular: The heart is enlarged and no pericardial effusion is seen. Scattered coronary artery calcifications are noted. There is atherosclerotic calcification of the aorta without evidence of aneurysm. The pulmonary trunk is normal in caliber. No large central pulmonary artery filling defect. Evaluation of the pulmonary arteries is limited due to respiratory motion artifact. Mediastinum/Nodes: The thyroid gland, trachea, and esophagus are within normal limits. Enlarged lymph nodes are present in the mediastinum and right hilum. No axillary lymphadenopathy. Lungs/Pleura: Multiple pulmonary nodules and masses are seen bilaterally, the largest measuring 3.1 cm in the left lower lobe there is pleural thickening on the  right with a small loculated pleural effusion and right lower lobe consolidation. No pneumothorax is seen bilaterally. Upper Abdomen: Visualized portion of the common bile duct is prominent in size at 1.2 cm. Musculoskeletal: There is bony deformity of the proximal sternum suggesting old healed fracture. Mixed lytic and sclerotic lesions are present throughout the bones. There are compression deformities at T1, T2, T3, T5, T6, T7, T8, T9, T10, T12, and L1, better evaluated on recent MRI. Kyphoplasty changes are present at T10, T8, and T7 Review of the MIP images confirms the above findings. IMPRESSION: 1. No large central pulmonary embolus is identified. Evaluation of the pulmonary arteries is limited due to respiratory motion artifact. The need for short-term repeat evaluation should be determined clinically. 2. Innumerable pulmonary nodules and masses bilaterally measuring up to 3.1 cm, suspicious for metastatic disease. There is also pleural thickening with a small loculated pleural effusion on the right. Consolidation is noted at the right lung base and the possibility of superimposed infection can not be excluded. 3. Multiple compression deformities, lytic and sclerotic lesions in the bones, and  kyphoplasty changes in the thoracic spine, better evaluated on recent MRI. 4. Cardiomegaly with scattered coronary artery calcifications. 5. Aortic atherosclerosis. 6. Dilatation of the common bile duct measuring up to 1.2 cm. Ultrasound is suggested for further evaluation. Electronically Signed   By: Brett Fairy M.D.   On: 08/06/2021 20:31   CT Cervical Spine Wo Contrast  Result Date: 08/06/2021 CLINICAL DATA:  Acute neck pain EXAM: CT CERVICAL SPINE WITHOUT CONTRAST TECHNIQUE: Multidetector CT imaging of the cervical spine was performed without intravenous contrast. Multiplanar CT image reconstructions were also generated. RADIATION DOSE REDUCTION: This exam was performed according to the departmental  dose-optimization program which includes automated exposure control, adjustment of the mA and/or kV according to patient size and/or use of iterative reconstruction technique. COMPARISON:  CT neck 11/01/2020 FINDINGS: Alignment: No acute subluxation. Skull base and vertebrae: Bony destructive lesion again seen at the anterior left mastoid air cells with associated adjacent irregular soft tissue mass density. The soft tissue mass appears decreased in size since previous study. No acute vertebral body fracture identified. Diffuse heterogeneous lucent appearance of the vertebral bodies especially in the lower cervical and upper thoracic spine, suggesting metastatic disease. Associated apparent soft tissue density extending dorsally into the spinal canal from C4 through C7. Soft tissues and spinal canal: No prevertebral soft tissue edema. No evidence of visible canal hematoma. Disc levels: Mild intervertebral disc space narrowing throughout the cervical spine. Upper chest: Multiple metastatic pulmonary nodules are visualized in the upper lungs which are increased since previous study measuring up to 2 cm near the left apex. Prominent superior mediastinal lymph nodes. Other: None. IMPRESSION: 1. Extensive osseous metastatic disease especially in the lower cervical and visualized upper thoracic vertebral bodies. Associated apparent soft tissue density extending dorsally into the spinal canal from C4 through C7, limited visualization with CT. Correlate clinically and consider follow-up MRI with contrast to evaluate degree of spinal canal stenoses and possible cord compression if indicated. 2. Multiple metastatic pulmonary nodules which are increased in size since previous study. 3. Redemonstration and slightly decreased size of an invasive irregular mass deep to the left parotid with destruction and invasion into the left mastoids as seen previously. Electronically Signed   By: Ofilia Neas M.D.   On: 08/06/2021 12:59    MR Cervical Spine W or Wo Contrast  Result Date: 08/06/2021 CLINICAL DATA:  Follow-up examination for metastatic disease. EXAM: MRI CERVICAL AND THORACIC SPINE WITHOUT AND WITH CONTRAST TECHNIQUE: Multiplanar and multiecho pulse sequences of the cervical spine, to include the craniocervical junction and cervicothoracic junction, and the thoracic spine, were obtained without and with intravenous contrast. CONTRAST:  31mL GADAVIST GADOBUTROL 1 MMOL/ML IV SOLN COMPARISON:  Prior CT from earlier the same day as well as previous MRI from 05/30/2020. FINDINGS: MRI CERVICAL SPINE FINDINGS Alignment: Examination moderately to severely degraded by motion artifact. Vertebral bodies normally aligned with preservation of the normal cervical lordosis. No listhesis. Vertebrae: Metastatic disease involving the upper thoracic spine noted, discussed on corresponding thoracic portion of this exam. Otherwise, signal intensity within the visualized bone marrow of the cervical spine is within normal limits. No metastatic involvement within the cervical spine itself. No other discrete or worrisome osseous lesions. No other abnormal marrow edema or enhancement. Vertebral body height maintained without acute or chronic fracture. Cord: Signal intensity within the cervical spinal cord is grossly within normal limits. No appreciable cord signal abnormality or abnormal enhancement on this motion degraded exam. Posterior Fossa, vertebral arteries, paraspinal tissues: Probable chronic  microvascular ischemic disease noted within the partially visualized pons and brainstem. Visualized brain and posterior fossa otherwise unremarkable. Craniocervical junction within normal limits. Paraspinous soft tissues within normal limits. Normal flow voids seen within the vertebral arteries bilaterally. Disc levels: Mild for age degenerative disc desiccation with minimal disc bulging throughout the cervical spine. No significant spinal stenosis or frank  cord impingement. Foramina appear grossly patent. MRI THORACIC SPINE FINDINGS Alignment: Examination moderately to severely degraded by motion artifact. Vertebral bodies normally aligned with preservation of the normal thoracic kyphosis. Trace anterolisthesis of T9 on T10, with trace retrolisthesis of T10 on T11 noted. Vertebrae: Abnormal T1 hypointense, stir hyperintense enhancing lesions seen involving the T1 and T2 vertebral bodies, consistent with metastatic disease. Associated mild height loss at these levels consistent with associated pathologic fractures, most pronounced at the T2 level where height loss measures up to approximately 30-40%. Epidural extension with tumor seen involving the right greater than left ventral epidural space at this level (series 21, image 14). Mild flattening and mass effect on the thoracic cord posteriorly with up to moderate spinal stenosis. No visible cord signal changes on this motion degraded exam. Lateral extension to involve the T1-2 neural foramina bilaterally which are largely obliterated. Additional extension into the left greater than right T2-3 neural foramina with mild to moderate bilateral foraminal stenosis. Additional extension to involve the posterior elements including the bilateral pedicles and posterior facets noted as well. Additional metastatic lesion involving the T6 vertebral body seen as well. Associated pathologic compression fracture with up to 50% height loss. Epidural extension of tumor with small volume tumor seen extending into the ventral epidural space (series 21, image 12). No more than mild spinal stenosis at this level. No other metastatic disease seen within the thoracic spine. Late subacute to chronic compression fracture involving the superior endplate of G01 with mild 30% height loss and 3 mm bony retropulsion noted. Multiple additional chronic compression fractures noted elsewhere throughout the thoracic spine, involving the T3, T5, T7, T8,  and T10 vertebral bodies. Additional chronic compression fractures involving the upper lumbar spine at L1 through L3 noted. Sequelae of prior vertebral augmentation present at T7, T8, and T10. Cord: Signal intensity within the thoracic spinal cord is within normal limits. No appreciable cord signal changes on this motion degraded exam. Paraspinal and other soft tissues: Moderate right with small left layering pleural effusions. Innumerable pulmonary nodules seen throughout the visualized lungs, consistent with metastatic disease. Disc levels: Multilevel degenerative disc bulging seen throughout the thoracic spine extending from C5-6 through the visualized upper lumbar spine. Superimposed multilevel facet arthropathy. No other high-grade spinal stenosis. Neural foramina otherwise appear to be largely patent. IMPRESSION: 1. Motion degraded exam. 2. 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 Epidural extension with tumor extending into the ventral epidural space at these levels, resulting in up to moderate spinal stenosis and cord flattening. No visible cord signal changes on this motion degraded exam. 3. Additional metastatic lesion involving the T6 vertebral body with associated pathologic fracture and up to 50% height loss. Small volume ventral epidural extension of tumor with no more than mild spinal stenosis at this level. 4. No evidence for metastatic disease within the cervical spine. 5. Innumerable pulmonary nodules throughout the visualized lungs, consistent with metastatic disease. 6. Late subacute to chronic compression fracture of the superior endplate of V49 with mild 30% height loss and 3 mm bony retropulsion. Multiple additional chronic compression fractures as above. 7.  Small to moderate bilateral pleural effusions, greater on the right. 8. Underlying multilevel degenerative spondylosis and facet degeneration throughout the cervicothoracic  spine. No other high-grade spinal stenosis or impingement. Electronically Signed   By: Jeannine Boga M.D.   On: 08/06/2021 19:22   MR THORACIC SPINE W WO CONTRAST  Result Date: 08/06/2021 CLINICAL DATA:  Follow-up examination for metastatic disease. EXAM: MRI CERVICAL AND THORACIC SPINE WITHOUT AND WITH CONTRAST TECHNIQUE: Multiplanar and multiecho pulse sequences of the cervical spine, to include the craniocervical junction and cervicothoracic junction, and the thoracic spine, were obtained without and with intravenous contrast. CONTRAST:  61mL GADAVIST GADOBUTROL 1 MMOL/ML IV SOLN COMPARISON:  Prior CT from earlier the same day as well as previous MRI from 05/30/2020. FINDINGS: MRI CERVICAL SPINE FINDINGS Alignment: Examination moderately to severely degraded by motion artifact. Vertebral bodies normally aligned with preservation of the normal cervical lordosis. No listhesis. Vertebrae: Metastatic disease involving the upper thoracic spine noted, discussed on corresponding thoracic portion of this exam. Otherwise, signal intensity within the visualized bone marrow of the cervical spine is within normal limits. No metastatic involvement within the cervical spine itself. No other discrete or worrisome osseous lesions. No other abnormal marrow edema or enhancement. Vertebral body height maintained without acute or chronic fracture. Cord: Signal intensity within the cervical spinal cord is grossly within normal limits. No appreciable cord signal abnormality or abnormal enhancement on this motion degraded exam. Posterior Fossa, vertebral arteries, paraspinal tissues: Probable chronic microvascular ischemic disease noted within the partially visualized pons and brainstem. Visualized brain and posterior fossa otherwise unremarkable. Craniocervical junction within normal limits. Paraspinous soft tissues within normal limits. Normal flow voids seen within the vertebral arteries bilaterally. Disc levels: Mild for  age degenerative disc desiccation with minimal disc bulging throughout the cervical spine. No significant spinal stenosis or frank cord impingement. Foramina appear grossly patent. MRI THORACIC SPINE FINDINGS Alignment: Examination moderately to severely degraded by motion artifact. Vertebral bodies normally aligned with preservation of the normal thoracic kyphosis. Trace anterolisthesis of T9 on T10, with trace retrolisthesis of T10 on T11 noted. Vertebrae: Abnormal T1 hypointense, stir hyperintense enhancing lesions seen involving the T1 and T2 vertebral bodies, consistent with metastatic disease. Associated mild height loss at these levels consistent with associated pathologic fractures, most pronounced at the T2 level where height loss measures up to approximately 30-40%. Epidural extension with tumor seen involving the right greater than left ventral epidural space at this level (series 21, image 14). Mild flattening and mass effect on the thoracic cord posteriorly with up to moderate spinal stenosis. No visible cord signal changes on this motion degraded exam. Lateral extension to involve the T1-2 neural foramina bilaterally which are largely obliterated. Additional extension into the left greater than right T2-3 neural foramina with mild to moderate bilateral foraminal stenosis. Additional extension to involve the posterior elements including the bilateral pedicles and posterior facets noted as well. Additional metastatic lesion involving the T6 vertebral body seen as well. Associated pathologic compression fracture with up to 50% height loss. Epidural extension of tumor with small volume tumor seen extending into the ventral epidural space (series 21, image 12). No more than mild spinal stenosis at this level. No other metastatic disease seen within the thoracic spine. Late subacute to chronic compression fracture involving the superior endplate of O16 with mild 30% height loss and 3 mm bony retropulsion  noted. Multiple additional chronic compression fractures noted elsewhere throughout the thoracic spine, involving the T3, T5, T7, T8, and T10  vertebral bodies. Additional chronic compression fractures involving the upper lumbar spine at L1 through L3 noted. Sequelae of prior vertebral augmentation present at T7, T8, and T10. Cord: Signal intensity within the thoracic spinal cord is within normal limits. No appreciable cord signal changes on this motion degraded exam. Paraspinal and other soft tissues: Moderate right with small left layering pleural effusions. Innumerable pulmonary nodules seen throughout the visualized lungs, consistent with metastatic disease. Disc levels: Multilevel degenerative disc bulging seen throughout the thoracic spine extending from C5-6 through the visualized upper lumbar spine. Superimposed multilevel facet arthropathy. No other high-grade spinal stenosis. Neural foramina otherwise appear to be largely patent. IMPRESSION: 1. Motion degraded exam. 2. 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 Epidural extension with tumor extending into the ventral epidural space at these levels, resulting in up to moderate spinal stenosis and cord flattening. No visible cord signal changes on this motion degraded exam. 3. Additional metastatic lesion involving the T6 vertebral body with associated pathologic fracture and up to 50% height loss. Small volume ventral epidural extension of tumor with no more than mild spinal stenosis at this level. 4. No evidence for metastatic disease within the cervical spine. 5. Innumerable pulmonary nodules throughout the visualized lungs, consistent with metastatic disease. 6. Late subacute to chronic compression fracture of the superior endplate of H82 with mild 30% height loss and 3 mm bony retropulsion. Multiple additional chronic compression fractures as above. 7. Small to moderate bilateral pleural  effusions, greater on the right. 8. 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 No other high-grade spinal stenosis or impingement. Electronically Signed   By: Jeannine Boga M.D.   On: 08/06/2021 19:22   DG Femur Min 2 Views Left  Result Date: 08/06/2021 CLINICAL DATA:  Left lower extremity muscle spasms. EXAM: LEFT FEMUR 2 VIEWS COMPARISON:  None. FINDINGS: There is no evidence of fracture or other focal bone lesions. A small joint effusion is seen within the left knee. IMPRESSION: 1. No acute osseous abnormality. 2. Small left knee joint effusion. Electronically Signed   By: Virgina Norfolk M.D.   On: 08/06/2021 20:23   US Abdomen Limited RUQ (LIVER/GB)  Result Date: 08/06/2021 CLINICAL DATA:  Dilated CBD EXAM: ULTRASOUND ABDOMEN LIMITED RIGHT UPPER QUADRANT COMPARISON:  CT 08/06/2021 FINDINGS: Gallbladder: Sludge and stones within the gallbladder. Normal wall thickness. No sonographic Percell Miller indicated but pain medication given per history form. Common bile duct: Diameter: 15.5 mm Liver: Liver slightly echogenic. No focal hepatic abnormality. Portal vein is patent on color Doppler imaging with normal direction of blood flow towards the liver. Other: None. IMPRESSION: 1. Gallstones without sonographic evidence for acute cholecystitis 2. Dilated common bile duct up to 15.5 mm. Consider correlation with MRCP to assess for ductal obstruction. 3. Slightly echogenic liver suggesting steatosis Electronically Signed   By: Donavan Foil M.D.   On: 08/06/2021 22:31    Review of Systems Blood pressure 125/67, pulse 95, temperature 98.4 F (36.9 C), temperature source Oral, resp. rate 18, weight 117.8 kg, SpO2 94 %. Physical Exam Constitutional:      Appearance: She is normal weight.  HENT:     Head: Normocephalic.     Nose: Nose normal.  Eyes:     Pupils: Pupils are equal, round, and reactive to light.  Musculoskeletal:      Cervical back: Normal range of motion and neck supple.  Skin:    General: Skin is warm and dry.  Capillary Refill: Capillary refill takes less than 2 seconds.  Neurological:     General: No focal deficit present.     Mental Status: She is alert and oriented to person, place, and time.     Comments: No reflexes noted at knees or ankles, no clonus b/l  Psychiatric:        Mood and Affect: Mood normal.        Behavior: Behavior normal.    Assessment/Plan: Patient has widespread metastatic disease throughout her lungs and thoracic spine. Her neurologic symptoms are minimal and she has no clonus. I do not feel that the extensive surgery that she would need to remove the metastatic lesions from her spinal canal are worth the risk, given her other extensive comorbidities, and I do not feel surgery is likely to improve her function. Instead I feel that management by her oncologist, or potentially, palliative care would be the most appropriate course of action. My thoughts were discussed with patient.   Theresa Fernandez L Theresa Fernandez 08/08/2021, 12:28 PM

## 2021-08-09 DIAGNOSIS — G893 Neoplasm related pain (acute) (chronic): Secondary | ICD-10-CM | POA: Diagnosis not present

## 2021-08-09 DIAGNOSIS — Z515 Encounter for palliative care: Secondary | ICD-10-CM

## 2021-08-09 DIAGNOSIS — M8458XA Pathological fracture in neoplastic disease, other specified site, initial encounter for fracture: Secondary | ICD-10-CM | POA: Diagnosis not present

## 2021-08-09 DIAGNOSIS — C7949 Secondary malignant neoplasm of other parts of nervous system: Secondary | ICD-10-CM | POA: Diagnosis not present

## 2021-08-09 DIAGNOSIS — Z66 Do not resuscitate: Secondary | ICD-10-CM | POA: Diagnosis not present

## 2021-08-09 DIAGNOSIS — M546 Pain in thoracic spine: Secondary | ICD-10-CM | POA: Diagnosis not present

## 2021-08-09 DIAGNOSIS — M542 Cervicalgia: Secondary | ICD-10-CM | POA: Diagnosis not present

## 2021-08-09 LAB — GLUCOSE, CAPILLARY
Glucose-Capillary: 103 mg/dL — ABNORMAL HIGH (ref 70–99)
Glucose-Capillary: 104 mg/dL — ABNORMAL HIGH (ref 70–99)
Glucose-Capillary: 165 mg/dL — ABNORMAL HIGH (ref 70–99)
Glucose-Capillary: 104 mg/dL — ABNORMAL HIGH (ref 70–99)

## 2021-08-09 NOTE — Consult Note (Signed)
Consultation Note Date: 08/09/2021   Patient Name: Theresa Fernandez  DOB: Oct 27, 1955  MRN: [id number]  Age / Sex: 66 y.o., female  [facility]P: [facility] [place] Referring Physician: Debbe Odea, MD  Reason for Consultation: Establishing goals of care and Psychosocial/spiritual support  HPI/Patient Profile: 66 y.o. 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    Patient reports that she has been struggling with severe pain in her upper back on the left side, as well as neck for the past 2 months, was seen by an orthopedic surgeon and plan to get outpatient MRI but now has unbearable pain despite Percocet, baclofen, and Lyrica.    She also has worsening numbness and weakness in her left arm and complains of increasing shortness of breath, bilateral leg swelling, and orthopnea.  She denies fever, chills, cough, or wheezing.       Extensive imaging was performed in the emergency department and demonstrates dilated CBD, increased metastatic pulmonary nodules, and extensive osseous metastatic disease with soft tissue density extending dorsally into the spinal canal, pathologic compression fractures, and spinal stenosis.    Per orthopedic consult recommendations for medical management of pain, palliative/hospice approach   secondary to widespread metastatic disease and poor surgical candidate.  Patient and family face treatment option decisions, advanced directive decisions and anticipatory care needs.  Clinical Assessment and Goals of Care:  This NP Wadie Lessen reviewed medical records, received report  from team, assessed the patient and then meet at the patient's bedside along with her husband to discuss diagnosis, prognosis, GOC, EOL wishes disposition and options.   Concept of Palliative Care was introduced as specialized medical care for people and their families living with serious illness.  If focuses on providing relief from the symptoms and stress of a serious illness.  The goal is to improve quality of life for both the patient and the family.  Values and goals of care important to patient and family were attempted to be elicited.  Created space and opportunity for patient  and family to explore thoughts and feelings regarding current medical situation.  This has been  overwhelming for this patient and her family.  She was  diagnosed less than a year ago and feels like it is just "snowballed".  She speaks to her wish for continued life in hopes of spending time with family specifically her 3 small grandchildren.  She and her husband are very sure in their decision to follow-up with their oncologist at [facility] (appt is in 3 weeks) they want to have  scanning further completed in order to understand.  I shared my concern that with the significant physical and functional decline it may be very difficult to make those follow-up appointments without significant difficulty and pain.  Education offered on  hospice benefit; philosophy and eligibility and services in the home. I believe this patient is clearly hospice eligible, however with their hope of follow-up with oncology and openness to any viable treatment options I am unsure if she will meet hospice criteria.    I asked that hospice liaison speak with family and that hospice medical director review patient for services.  A  discussion was had today regarding advanced directives.  Concepts specific to code status, artifical feeding and hydration, continued IV antibiotics and rehospitalization was had.  The difference between a aggressive  medical intervention path  and a palliative comfort care path for this patient at this time was had.     MOST form completed   Hard Choices left for review   Natural trajectory and expectations at EOL were discussed.  Questions and concerns addressed.  Patient  encouraged to call with questions or concerns.     PMT will continue to support holistically.         No documented healthcare power of attorney or advanced care planning documents at this time.  Patient and husband wish to complete those documents while here in the hospital.  I will put in a referral to spiritual care.  SUMMARY OF RECOMMENDATIONS    Code Status/Advance Care Planning: DNR-documented today   Symptom Management:   In-depth education offered on pain management strategies specific to metastatic disease process..  Recommendation for extended release oxycodone with continued oxycodone for breakthrough pain.  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 The following aspects are pertinent.  Past Medical History:  Diagnosis Date   Allergy    Anxiety    Arthritis    CHF (congestive heart failure) (HCC)    COPD (chronic obstructive pulmonary disease) (HCC)    Depression    Diabetes mellitus without complication (HCC)    HTN (hypertension) 01/12/2013   pt denies   Social History   Socioeconomic History   Marital status: Married    Spouse name: Not on file   Number of children: Not on file   Years of education: Not on file   Highest education level: Not on file  Occupational History   Not on file  Tobacco Use   Smoking status: Former    Packs/day: 3.00    Years: 50.00    Pack years: 150.00    Types: Cigarettes    Quit date: 05/30/2018    Years since quitting: 3.1   Smokeless tobacco: Never  Vaping Use   Vaping Use: Never used  Substance and Sexual Activity   Alcohol use: No   Drug use:  No   Sexual activity: Not on file  Other Topics Concern   Not on file  Social History Narrative   Not on file   Social Determinants of Health   Financial Resource Strain: Not on file  Food Insecurity: Not on file  Transportation Needs: Not on file  Physical Activity: Not on file  Stress: Not on file  Social Connections: Not on file   Family History  Problem Relation Age of Onset   Diabetes Mother    Alcohol abuse Father    Diabetes Father    Stroke Brother    Scheduled Meds:  acetaminophen  650 mg Oral Q6H   busPIRone  30 mg Oral QHS   fluticasone furoate-vilanterol  1 puff Inhalation Daily   furosemide  40 mg Oral Daily   lamoTRIgine  25 mg Oral QHS   pantoprazole  40 mg Oral BID   pravastatin  80 mg Oral q1800   prazosin  5 mg Oral QHS   pregabalin  150 mg Oral BID   sertraline  200 mg Oral QHS   umeclidinium bromide  1 puff Inhalation Daily   valACYclovir  1,000 mg Oral BID   Continuous Infusions: PRN Meds:.albuterol, diazepam, fentaNYL (SUBLIMAZE) injection,  ondansetron (ZOFRAN) IV, oxyCODONE, polyethylene glycol Medications Prior to Admission:  Prior to Admission medications   Medication Sig Start Date End Date Taking? Authorizing Provider  albuterol (ACCUNEB) 0.63 MG/3ML nebulizer solution Take 3 mLs (0.63 mg total) by nebulization every 4 (four) hours as needed for wheezing or shortness of breath. 07/17/20  Yes Icard, Bradley L, DO  albuterol (VENTOLIN HFA) 108 (90 Base) MCG/ACT inhaler Inhale 2 puffs into the lungs every 6 (six) hours as needed for wheezing or shortness of breath (wheezing). 07/17/20  Yes Icard, Octavio Graves, DO  ALPRAZolam (XANAX) 0.5 MG tablet Take 0.5 mg by mouth 2 (two) times daily as needed for anxiety. 11/28/19  Yes [provider]  baclofen (LIORESAL) 10 MG tablet Take 10 mg by mouth 3 (three) times daily as needed for muscle spasms. 07/28/21  Yes [provider]  Budeson-Glycopyrrol-Formoterol (BREZTRI AEROSPHERE) 160-9-4.8 MCG/ACT AERO Inhale 2 puffs into the lungs in the morning and at bedtime. 05/17/21  Yes Icard, Bradley L, DO  busPIRone (BUSPAR) 15 MG tablet Take 30 mg by mouth 2 (two) times daily.   Yes [provider]  furosemide (LASIX) 40 MG tablet Take 1 tablet (40 mg total) by mouth 2 (two) times daily. Patient taking differently: Take 40 mg by mouth daily as needed for fluid or edema. 10/19/18  Yes Hilty, Nadean Corwin, MD  hydrOXYzine (ATARAX/VISTARIL) 25 MG tablet Take 25 mg by mouth daily. 04/21/18  Yes [provider]  lamoTRIgine (LAMICTAL) 25 MG tablet Take 25 mg by mouth at bedtime.  04/21/18  Yes [provider]  lovastatin (MEVACOR) 40 MG tablet Take 80 mg by mouth daily. 03/18/20  Yes [provider]  naloxone (NARCAN) nasal spray 4 mg/0.1 mL Place 4 mg into the nose as needed for opioid reversal. 04/07/21  Yes [provider]  oxyCODONE-acetaminophen (PERCOCET) 10-325 MG tablet Take 1 tablet by mouth See admin instructions. Every 4 to 6 hours as needed for  pain 08/01/21  Yes [provider]  pantoprazole (PROTONIX) 40 MG tablet Take 1 tablet (40 mg total) by mouth 2 (two) times daily. 08/01/20 09/29/21 Yes Swayze, Ava, DO  pioglitazone (ACTOS) 15 MG tablet Take 15 mg by mouth daily. 09/13/19  Yes [provider]  prazosin (  MINIPRESS) 5 MG capsule Take 5 mg by mouth at bedtime. 12/04/19  Yes [provider]  pregabalin (LYRICA) 150 MG capsule Take 1 capsule (150 mg total) by mouth at bedtime. Patient taking differently: Take 100 mg by mouth 2 (two) times daily. 02/11/13  Yes Johnson, Clanford L, MD  pregabalin (LYRICA) 150 MG capsule Take 1 capsule by mouth 2 (two) times daily. 08/15/20  Yes [provider]  sertraline (ZOLOFT) 100 MG tablet Take 200 mg by mouth in the morning and at bedtime.   Yes [provider]  Tiotropium Bromide-Olodaterol (STIOLTO RESPIMAT) 2.5-2.5 MCG/ACT AERS Inhale 2 puffs into the lungs daily. Patient taking differently: Inhale 2 puffs into the lungs in the morning and at bedtime. 07/17/20  Yes Icard, Octavio Graves, DO  valACYclovir (VALTREX) 1000 MG tablet Take 1,000 mg by mouth 2 (two) times daily. 07/28/21  Yes [provider]  budesonide-formoterol (SYMBICORT) 80-4.5 MCG/ACT inhaler Inhale 2 puffs into the lungs 2 (two) times daily. Patient not taking: No sig reported 07/20/18 11/23/19  Martyn Ehrich, NP   Allergies  Allergen Reactions   Ceftriaxone Hives and Rash    After second dose on 06/07/2018,   Topamax [Topiramate] Other (See Comments)    Made headache worse   Capsaicin Rash   Voltaren [Diclofenac Sodium] Other (See Comments)    Caused migraine   Review of Systems  Constitutional:  Positive for fatigue.  Neurological:  Positive for weakness.   Physical Exam Cardiovascular:     Rate and Rhythm: Normal rate.  Musculoskeletal:     Comments: Pain in feet and arms and shoulder blades, generalized pain   Skin:    General: Skin is warm and dry.  Neurological:      Mental Status: She is alert.    Vital Signs: BP 110/67 (BP Location: Right Wrist)    Pulse 85    Temp 98.7 F (37.1 C) (Oral)    Resp 16    Wt 118.3 kg    SpO2 93%    BMI 42.09 kg/m  Pain Scale: 0-10   Pain Score: 5    SpO2: SpO2: 93 % O2 Device:SpO2: 93 % O2 Flow Rate: .O2 Flow Rate (L/min): 4 L/min  IO: Intake/output summary:  Intake/Output Summary (Last 24 hours) at 08/09/2021 1301 Last data filed at 08/08/2021 1500 Gross per 24 hour  Intake 700 ml  Output 900 ml  Net -200 ml    LBM: Last BM Date: 08/06/21 Baseline Weight: Weight: 117.8 kg Most recent weight: Weight: 118.3 kg     Palliative Assessment/Data: 40 %     Discussed with Dr. Wynelle Cleveland, transition of care team and hospice liaison.  Signed by: Wadie Lessen, NP   Please contact Palliative Medicine Team phone at [phone] for questions and concerns.  For individual provider: See Shea Evans

## 2021-08-09 NOTE — Progress Notes (Signed)
PROGRESS NOTE    Kennya Schwenn   ZOX:[id number]  DOB: 10/12/1955  DOA: 08/06/2021 PCP: [facility]   Brief Narrative:  Shizue Kaseman  Theresa Fernandez is an 66 y.o. 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    Subjective: Has pain  under her left arm today.     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 Zoloft - uses Xanax at home which is on hold for now    Diabetes mellitus without complication ([place]) -Sugars have been quite stable - She is not on medication at home -A1c was 5.6 a year ago - Will DC sliding scale insulin    Chronic diastolic CHF (congestive heart failure) ([place]) -Last echo from 1/22 revealed an EF of 65 to 70% and grade 1 diastolic heart failure - Continue oral Lasix  COPD with chronic respiratory failure - Previously a smoker - No wheezing or exacerbation noted at this time  Time spent in minutes: 35 DVT prophylaxis: SCDs Start: 08/06/21 2148 Code Status: Full code Family Communication: Husband Level of Care:  Level of care: Med-Surg Disposition Plan:  Status is: Inpatient  Remains inpatient appropriate because: Needs consult with palliative care to further discuss plan and help arrange hospice at home      Consultants:  Palliative care Procedures:  None Antimicrobials:  Anti-infectives (From admission, onward)    Start     Dose/Rate Route Frequency Ordered Stop   08/06/21 2200  valACYclovir (VALTREX) tablet 1,000 mg        1,000 mg Oral 2 times daily 08/06/21 2148          Objective: Vitals:   08/09/21 0304 08/09/21 0338 08/09/21 0752 08/09/21 0821  BP: 109/68  122/72   Pulse: 96  87 84  Resp: 18  18 18   Temp: 98 F (36.7 C)  98.9 F (37.2 C)   TempSrc: Axillary     SpO2:   94% 95%  Weight:  118.3 kg      Intake/Output Summary (Last 24 hours) at 08/09/2021 1013 Last data filed at 08/08/2021 1500 Gross per 24 hour  Intake 700 ml  Output 1350 ml  Net -650 ml    Filed Weights   08/08/21 0500 08/09/21 0338  Weight: 117.8 kg 118.3 kg    Examination: General exam: Appears comfortable  HEENT: PERRLA, oral mucosa moist, no sclera icterus or thrush Respiratory system: Clear to auscultation. Respiratory effort normal. Cardiovascular system: S1 & S2 heard, RRR.   Gastrointestinal system: Abdomen soft, non-tender, nondistended. Normal bowel sounds. Central nervous system: Alert and oriented. No focal neurological deficits. Extremities: No cyanosis, clubbing or edema  Skin: No rashes or ulcers Psychiatry:  Mood & affect appropriate.     Data Reviewed: I have personally reviewed following labs and imaging studies  CBC: Recent Labs  Lab 08/06/21 1630 08/07/21 0322 08/08/21 0404  WBC 9.5 9.5 9.4  NEUTROABS 6.8  --   --   HGB 11.6* 12.3 11.6*  HCT 38.3 40.5 36.7  MCV 100.3* 99.8 96.8  PLT 269 270 962    Basic Metabolic Panel: Recent Labs  Lab 08/06/21 1630 08/07/21 0322 08/08/21 0404  NA 140 138 140  K 4.0 4.0 3.6  CL 95* 94* 97*  CO2 34* 31 33*   GLUCOSE 99 132* 118*  BUN 15 14 20   CREATININE 1.07* 1.04* 1.36*  CALCIUM 9.1 9.5 8.9    GFR: Estimated Creatinine Clearance: 53.3 mL/min (A) (by C-G formula based on SCr of 1.36 mg/dL (H)). Liver Function Tests: Recent Labs  Lab 08/06/21 1630 08/08/21 0404  AST 19 19  ALT 15 19  ALKPHOS 90 91  BILITOT 0.5 0.2*  PROT 7.0 6.6  ALBUMIN 3.2* 3.1*    No results for input(s): LIPASE, AMYLASE in the last 168 hours. No results for input(s): AMMONIA in the last 168 hours. Coagulation Profile: No results for input(s): INR, PROTIME in the last 168 hours. Cardiac Enzymes: No results for input(s): CKTOTAL, CKMB, CKMBINDEX, TROPONINI in the last 168 hours. BNP (last 3 results) No results for input(s): PROBNP in the last 8760 hours. HbA1C: Recent Labs    08/07/21 0322  HGBA1C 6.0*    CBG: Recent Labs  Lab 08/08/21 1541 08/08/21 1926 08/08/21 2314 08/09/21 0310 08/09/21 0751  GLUCAP 98 115* 107* 103* 104*    Lipid Profile: No results for input(s): CHOL, HDL, LDLCALC, TRIG, CHOLHDL, LDLDIRECT in the last 72 hours. Thyroid Function Tests: Recent Labs    08/06/21 1630  TSH 1.306    Anemia Panel: No results for input(s): VITAMINB12, FOLATE, FERRITIN, TIBC, IRON, RETICCTPCT in the last 72 hours. Urine analysis:    Component Value Date/Time   COLORURINE YELLOW 08/06/2021 2025   APPEARANCEUR CLOUDY (A) 08/06/2021 2025   LABSPEC 1.015 08/06/2021 2025   PHURINE 8.0 08/06/2021 2025   GLUCOSEU NEGATIVE 08/06/2021 2025   HGBUR NEGATIVE 08/06/2021 2025   BILIRUBINUR NEGATIVE 08/06/2021 2025   KETONESUR NEGATIVE 08/06/2021 2025   PROTEINUR NEGATIVE 08/06/2021 2025   UROBILINOGEN 0.2 03/06/2015 1205   NITRITE NEGATIVE 08/06/2021 2025   LEUKOCYTESUR LARGE (A) 08/06/2021 2025   Sepsis Labs: @LABRCNTIP (procalcitonin:4,lacticidven:4) ) Recent Results (from the past 240 hour(s))  Resp Panel by RT-PCR (Flu A&B, Covid) Nasopharyngeal Swab     Status: None   Collection  Time: 08/06/21  4:06 PM   Specimen: Nasopharyngeal Swab; Nasopharyngeal(NP) swabs in vial transport medium  Result Value Ref Range Status   SARS Coronavirus 2 by RT PCR NEGATIVE NEGATIVE Final    Comment: (NOTE) SARS-CoV-2 target nucleic acids are NOT DETECTED.  The SARS-CoV-2 RNA is generally detectable in upper respiratory specimens during the acute phase of infection. The lowest concentration of SARS-CoV-2 viral copies this assay can detect is 138 copies/mL. A negative result does not preclude SARS-Cov-2 infection and should not be used as the sole basis for treatment or other patient management decisions. A negative result may occur with  improper specimen collection/handling, submission of specimen other than nasopharyngeal swab, presence of viral mutation(s) within the areas targeted by this assay, and inadequate number of viral copies(<138 copies/mL). A negative result must be combined with clinical observations, patient history,  and epidemiological information. The expected result is Negative.  Fact Sheet for Patients:  EntrepreneurPulse.com.au  Fact Sheet for Healthcare Providers:  IncredibleEmployment.be  This test is no t yet approved or cleared by the [place] FDA and  has been authorized for detection and/or diagnosis of SARS-CoV-2 by FDA under an Emergency Use Authorization (EUA). This EUA will remain  in effect (meaning this test can be used) for the duration of the COVID-19 declaration under Section 564(b)(1) of the Act, 21 U.S.C.section 360bbb-3(b)(1), unless the authorization is terminated  or revoked sooner.       Influenza A by PCR NEGATIVE NEGATIVE Final   Influenza B by PCR NEGATIVE NEGATIVE Final    Comment: (NOTE) The Xpert Xpress SARS-CoV-2/FLU/RSV plus assay is intended as an aid in the diagnosis of influenza from Nasopharyngeal swab specimens and should not be used as a sole basis for treatment. Nasal washings  and aspirates are unacceptable for Xpert Xpress SARS-CoV-2/FLU/RSV testing.  Fact Sheet for Patients: EntrepreneurPulse.com.au  Fact Sheet for Healthcare Providers: IncredibleEmployment.be  This test is not yet approved or cleared by the [place] FDA and has been authorized for detection and/or diagnosis of SARS-CoV-2 by FDA under an Emergency Use Authorization (EUA). This EUA will remain in effect (meaning this test can be used) for the duration of the COVID-19 declaration under Section 564(b)(1) of the Act, 21 U.S.C. section 360bbb-3(b)(1), unless the authorization is terminated or revoked.  Performed at [facility], [place] [address] [place], [place] [address]   MRSA Next Gen by PCR, Nasal     Status: None   Collection Time: 08/07/21 12:03 PM   Specimen: Nasal Mucosa; Nasal Swab  Result Value Ref Range Status   MRSA by PCR Next Gen NOT DETECTED NOT DETECTED Final    Comment: (NOTE) The GeneXpert MRSA Assay (FDA approved for NASAL specimens only), is one component of a comprehensive MRSA colonization surveillance program. It is not intended to diagnose MRSA infection nor to guide or monitor treatment for MRSA infections. Test performance is not FDA approved in patients less than 32 years old. Performed at [facility], [place] [address] [place], [place] [address]          Radiology Studies: No results found.    Scheduled Meds:  acetaminophen  650 mg Oral Q6H   busPIRone  30 mg Oral QHS   fluticasone furoate-vilanterol  1 puff Inhalation Daily   furosemide  40 mg Oral Daily   lamoTRIgine  25 mg Oral QHS   pantoprazole  40 mg Oral BID   pravastatin  80 mg Oral q1800   prazosin  5 mg Oral QHS   pregabalin  150 mg Oral BID   sertraline  200 mg Oral QHS   umeclidinium bromide  1 puff Inhalation Daily   valACYclovir  1,000 mg Oral BID   Continuous Infusions:   LOS: 3 days      Debbe Odea, MD [facility] Pager: [email or web address] 08/09/2021, 10:13 AM

## 2021-08-09 NOTE — Care Management Important Message (Signed)
Important Message  Patient Details  Name: Theresa Fernandez MRN: [id number] Date of Birth: 08-06-1955   Medicare Important Message Given:  Yes     [name] 08/09/2021, 12:15 PM

## 2021-08-10 DIAGNOSIS — M546 Pain in thoracic spine: Secondary | ICD-10-CM

## 2021-08-10 DIAGNOSIS — C7949 Secondary malignant neoplasm of other parts of nervous system: Secondary | ICD-10-CM | POA: Diagnosis not present

## 2021-08-10 DIAGNOSIS — M8458XA Pathological fracture in neoplastic disease, other specified site, initial encounter for fracture: Secondary | ICD-10-CM | POA: Diagnosis not present

## 2021-08-10 DIAGNOSIS — M542 Cervicalgia: Secondary | ICD-10-CM | POA: Diagnosis not present

## 2021-08-10 LAB — GLUCOSE, CAPILLARY: Glucose-Capillary: 110 mg/dL — ABNORMAL HIGH (ref 70–99)

## 2021-08-10 MED ORDER — DIAZEPAM 5 MG PO TABS
5.0000 mg | ORAL_TABLET | Freq: Three times a day (TID) | ORAL | 0 refills | Status: AC | PRN
Start: 2021-08-10 — End: ?

## 2021-08-10 NOTE — Discharge Summary (Signed)
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 space (HCC) with acute on chronic pain -Her pain is mostly in her mid back and under her left arm-it is better controlled after being hospitalized -I have spoken with both the patient and the husband who states that she has been offered immunotherapy but have been told that chances for resolution are slim-they have an appointment in 3 weeks - I have explained the concept of hospice and palliative care and feel that she may be a candidate for hospice as home-after speaking with the palliative care team they are both declining hospice and will be revisit it after oncology follow-up    Active Problems:   CKD (chronic kidney disease) stage 3, GFR 30-59 ml/min (HCC) -Follow intermittently      Depression/anxiety - Continue buspirone,Lamictal. Zoloft-patient has been receiving Valium in the hospital and states she prefers this over Xanax as it is helping her pain and acting as a muscle relaxant     Diabetes mellitus without complication ([place]) -Sugars have been quite stable - She is not on medication at home -A1c was 5.6 a year ago - Will DC sliding scale insulin     Chronic diastolic CHF (congestive heart failure) ([place]) -Last echo from 1/22 revealed an EF of 65 to 70% and grade 1 diastolic heart failure - Continue oral Lasix   COPD with chronic respiratory failure - Previously a smoker - No wheezing or exacerbation noted at this time     Discharge Exam: Vitals:   08/10/21 0300 08/10/21 0746  BP: 106/67 114/67  Pulse: 80 91  Resp: 18 17  Temp: 97.7 F (36.5 C) 98.5 F (36.9 C)  SpO2:  96%   Vitals:   08/09/21 1936 08/09/21 2341 08/10/21 0300 08/10/21 0746  BP: 114/65 101/61 106/67 114/67  Pulse: 82 90 80 91  Resp: $Remo'18 17 18 17  'iBOPD$ Temp: 97.7 F (36.5 C) 98 F (36.7 C) 97.7 F (36.5 C) 98.5 F (36.9 C)  TempSrc: Oral Oral Oral Oral  SpO2:    96%  Weight:        General: Pt is alert, awake, not in acute distress Cardiovascular: RRR, S1/S2 +, no rubs, no gallops Respiratory: CTA bilaterally, no wheezing, no rhonchi Abdominal: Soft, NT, ND, bowel sounds + Extremities: no edema, no cyanosis   Discharge Instructions  Discharge Instructions     Increase activity slowly  Complete by: As directed       Allergies as of 08/10/2021       Reactions   Ceftriaxone Hives, Rash   After second dose on 06/07/2018,   Topamax [topiramate] Other (See Comments)   Made headache worse   Capsaicin Rash   Voltaren [diclofenac Sodium] Other (See Comments)   Caused migraine        Medication List     STOP taking these medications    ALPRAZolam 0.5 MG tablet Commonly known as: XANAX       TAKE these medications    albuterol 0.63 MG/3ML nebulizer  solution Commonly known as: ACCUNEB Take 3 mLs (0.63 mg total) by nebulization every 4 (four) hours as needed for wheezing or shortness of breath.   albuterol 108 (90 Base) MCG/ACT inhaler Commonly known as: VENTOLIN HFA Inhale 2 puffs into the lungs every 6 (six) hours as needed for wheezing or shortness of breath (wheezing).   baclofen 10 MG tablet Commonly known as: LIORESAL Take 10 mg by mouth 3 (three) times daily as needed for muscle spasms.   Breztri Aerosphere 160-9-4.8 MCG/ACT Aero Generic drug: Budeson-Glycopyrrol-Formoterol Inhale 2 puffs into the lungs in the morning and at bedtime.   busPIRone 15 MG tablet Commonly known as: BUSPAR Take 30 mg by mouth 2 (two) times daily.   diazepam 5 MG tablet Commonly known as: VALIUM Take 1 tablet (5 mg total) by mouth every 8 (eight) hours as needed for muscle spasms or anxiety.   furosemide 40 MG tablet Commonly known as: LASIX Take 1 tablet (40 mg total) by mouth 2 (two) times daily. What changed:  when to take this reasons to take this   hydrOXYzine 25 MG tablet Commonly known as: ATARAX Take 25 mg by mouth daily.   lamoTRIgine 25 MG tablet Commonly known as: LAMICTAL Take 25 mg by mouth at bedtime.   lovastatin 40 MG tablet Commonly known as: MEVACOR Take 80 mg by mouth daily.   naloxone 4 MG/0.1ML Liqd nasal spray kit Commonly known as: NARCAN Place 4 mg into the nose as needed for opioid reversal.   oxyCODONE-acetaminophen 10-325 MG tablet Commonly known as: PERCOCET Take 1 tablet by mouth See admin instructions. Every 4 to 6 hours as needed for pain   pantoprazole 40 MG tablet Commonly known as: Protonix Take 1 tablet (40 mg total) by mouth 2 (two) times daily.   pioglitazone 15 MG tablet Commonly known as: ACTOS Take 15 mg by mouth daily.   prazosin 5 MG capsule Commonly known as: MINIPRESS Take 5 mg by mouth at bedtime.   pregabalin 150 MG capsule Commonly known as: LYRICA Take 1 capsule by  mouth 2 (two) times daily. What changed: Another medication with the same name was removed. Continue taking this medication, and follow the directions you see here.   sertraline 100 MG tablet Commonly known as: ZOLOFT Take 200 mg by mouth in the morning and at bedtime.   Stiolto Respimat 2.5-2.5 MCG/ACT Aers Generic drug: Tiotropium Bromide-Olodaterol Inhale 2 puffs into the lungs daily. What changed: when to take this   valACYclovir 1000 MG tablet Commonly known as: VALTREX Take 1,000 mg by mouth 2 (two) times daily.        Allergies  Allergen Reactions   Ceftriaxone Hives and Rash    After second dose on 06/07/2018,   Topamax [Topiramate] Other (See Comments)    Made headache worse   Capsaicin Rash   Voltaren [Diclofenac Sodium] Other (See Comments)  Caused migraine      CT Angio Chest PE W and/or Wo Contrast  Result Date: 08/06/2021 CLINICAL DATA:  Worsening shortness of breath, known cancer, pleuritic chest discomfort. EXAM: CT ANGIOGRAPHY CHEST WITH CONTRAST TECHNIQUE: Multidetector CT imaging of the chest was performed using the standard protocol during bolus administration of intravenous contrast. Multiplanar CT image reconstructions and MIPs were obtained to evaluate the vascular anatomy. RADIATION DOSE REDUCTION: This exam was performed according to the departmental dose-optimization program which includes automated exposure control, adjustment of the mA and/or kV according to patient size and/or use of iterative reconstruction technique. CONTRAST:  79mL OMNIPAQUE IOHEXOL 350 MG/ML SOLN COMPARISON:  09/06/2019, 08/06/2021. FINDINGS: Cardiovascular: The heart is enlarged and no pericardial effusion is seen. Scattered coronary artery calcifications are noted. There is atherosclerotic calcification of the aorta without evidence of aneurysm. The pulmonary trunk is normal in caliber. No large central pulmonary artery filling defect. Evaluation of the pulmonary arteries is  limited due to respiratory motion artifact. Mediastinum/Nodes: The thyroid gland, trachea, and esophagus are within normal limits. Enlarged lymph nodes are present in the mediastinum and right hilum. No axillary lymphadenopathy. Lungs/Pleura: Multiple pulmonary nodules and masses are seen bilaterally, the largest measuring 3.1 cm in the left lower lobe there is pleural thickening on the right with a small loculated pleural effusion and right lower lobe consolidation. No pneumothorax is seen bilaterally. Upper Abdomen: Visualized portion of the common bile duct is prominent in size at 1.2 cm. Musculoskeletal: There is bony deformity of the proximal sternum suggesting old healed fracture. Mixed lytic and sclerotic lesions are present throughout the bones. There are compression deformities at T1, T2, T3, T5, T6, T7, T8, T9, T10, T12, and L1, better evaluated on recent MRI. Kyphoplasty changes are present at T10, T8, and T7 Review of the MIP images confirms the above findings. IMPRESSION: 1. No large central pulmonary embolus is identified. Evaluation of the pulmonary arteries is limited due to respiratory motion artifact. The need for short-term repeat evaluation should be determined clinically. 2. Innumerable pulmonary nodules and masses bilaterally measuring up to 3.1 cm, suspicious for metastatic disease. There is also pleural thickening with a small loculated pleural effusion on the right. Consolidation is noted at the right lung base and the possibility of superimposed infection can not be excluded. 3. Multiple compression deformities, lytic and sclerotic lesions in the bones, and kyphoplasty changes in the thoracic spine, better evaluated on recent MRI. 4. Cardiomegaly with scattered coronary artery calcifications. 5. Aortic atherosclerosis. 6. Dilatation of the common bile duct measuring up to 1.2 cm. Ultrasound is suggested for further evaluation. Electronically Signed   By: Brett Fairy M.D.   On: 08/06/2021  20:31   CT Cervical Spine Wo Contrast  Result Date: 08/06/2021 CLINICAL DATA:  Acute neck pain EXAM: CT CERVICAL SPINE WITHOUT CONTRAST TECHNIQUE: Multidetector CT imaging of the cervical spine was performed without intravenous contrast. Multiplanar CT image reconstructions were also generated. RADIATION DOSE REDUCTION: This exam was performed according to the departmental dose-optimization program which includes automated exposure control, adjustment of the mA and/or kV according to patient size and/or use of iterative reconstruction technique. COMPARISON:  CT neck 11/01/2020 FINDINGS: Alignment: No acute subluxation. Skull base and vertebrae: Bony destructive lesion again seen at the anterior left mastoid air cells with associated adjacent irregular soft tissue mass density. The soft tissue mass appears decreased in size since previous study. No acute vertebral body fracture identified. Diffuse heterogeneous lucent appearance of the vertebral bodies especially in  the lower cervical and upper thoracic spine, suggesting metastatic disease. Associated apparent soft tissue density extending dorsally into the spinal canal from C4 through C7. Soft tissues and spinal canal: No prevertebral soft tissue edema. No evidence of visible canal hematoma. Disc levels: Mild intervertebral disc space narrowing throughout the cervical spine. Upper chest: Multiple metastatic pulmonary nodules are visualized in the upper lungs which are increased since previous study measuring up to 2 cm near the left apex. Prominent superior mediastinal lymph nodes. Other: None. IMPRESSION: 1. Extensive osseous metastatic disease especially in the lower cervical and visualized upper thoracic vertebral bodies. Associated apparent soft tissue density extending dorsally into the spinal canal from C4 through C7, limited visualization with CT. Correlate clinically and consider follow-up MRI with contrast to evaluate degree of spinal canal stenoses and  possible cord compression if indicated. 2. Multiple metastatic pulmonary nodules which are increased in size since previous study. 3. Redemonstration and slightly decreased size of an invasive irregular mass deep to the left parotid with destruction and invasion into the left mastoids as seen previously. Electronically Signed   By: Ofilia Neas M.D.   On: 08/06/2021 12:59   MR Cervical Spine W or Wo Contrast  Result Date: 08/06/2021 CLINICAL DATA:  Follow-up examination for metastatic disease. EXAM: MRI CERVICAL AND THORACIC SPINE WITHOUT AND WITH CONTRAST TECHNIQUE: Multiplanar and multiecho pulse sequences of the cervical spine, to include the craniocervical junction and cervicothoracic junction, and the thoracic spine, were obtained without and with intravenous contrast. CONTRAST:  60mL GADAVIST GADOBUTROL 1 MMOL/ML IV SOLN COMPARISON:  Prior CT from earlier the same day as well as previous MRI from 05/30/2020. FINDINGS: MRI CERVICAL SPINE FINDINGS Alignment: Examination moderately to severely degraded by motion artifact. Vertebral bodies normally aligned with preservation of the normal cervical lordosis. No listhesis. Vertebrae: Metastatic disease involving the upper thoracic spine noted, discussed on corresponding thoracic portion of this exam. Otherwise, signal intensity within the visualized bone marrow of the cervical spine is within normal limits. No metastatic involvement within the cervical spine itself. No other discrete or worrisome osseous lesions. No other abnormal marrow edema or enhancement. Vertebral body height maintained without acute or chronic fracture. Cord: Signal intensity within the cervical spinal cord is grossly within normal limits. No appreciable cord signal abnormality or abnormal enhancement on this motion degraded exam. Posterior Fossa, vertebral arteries, paraspinal tissues: Probable chronic microvascular ischemic disease noted within the partially visualized pons and  brainstem. Visualized brain and posterior fossa otherwise unremarkable. Craniocervical junction within normal limits. Paraspinous soft tissues within normal limits. Normal flow voids seen within the vertebral arteries bilaterally. Disc levels: Mild for age degenerative disc desiccation with minimal disc bulging throughout the cervical spine. No significant spinal stenosis or frank cord impingement. Foramina appear grossly patent. MRI THORACIC SPINE FINDINGS Alignment: Examination moderately to severely degraded by motion artifact. Vertebral bodies normally aligned with preservation of the normal thoracic kyphosis. Trace anterolisthesis of T9 on T10, with trace retrolisthesis of T10 on T11 noted. Vertebrae: Abnormal T1 hypointense, stir hyperintense enhancing lesions seen involving the T1 and T2 vertebral bodies, consistent with metastatic disease. Associated mild height loss at these levels consistent with associated pathologic fractures, most pronounced at the T2 level where height loss measures up to approximately 30-40%. Epidural extension with tumor seen involving the right greater than left ventral epidural space at this level (series 21, image 14). Mild flattening and mass effect on the thoracic cord posteriorly with up to moderate spinal stenosis. No visible cord signal  changes on this motion degraded exam. Lateral extension to involve the T1-2 neural foramina bilaterally which are largely obliterated. Additional extension into the left greater than right T2-3 neural foramina with mild to moderate bilateral foraminal stenosis. Additional extension to involve the posterior elements including the bilateral pedicles and posterior facets noted as well. Additional metastatic lesion involving the T6 vertebral body seen as well. Associated pathologic compression fracture with up to 50% height loss. Epidural extension of tumor with small volume tumor seen extending into the ventral epidural space (series 21, image  12). No more than mild spinal stenosis at this level. No other metastatic disease seen within the thoracic spine. Late subacute to chronic compression fracture involving the superior endplate of V25 with mild 30% height loss and 3 mm bony retropulsion noted. Multiple additional chronic compression fractures noted elsewhere throughout the thoracic spine, involving the T3, T5, T7, T8, and T10 vertebral bodies. Additional chronic compression fractures involving the upper lumbar spine at L1 through L3 noted. Sequelae of prior vertebral augmentation present at T7, T8, and T10. Cord: Signal intensity within the thoracic spinal cord is within normal limits. No appreciable cord signal changes on this motion degraded exam. Paraspinal and other soft tissues: Moderate right with small left layering pleural effusions. Innumerable pulmonary nodules seen throughout the visualized lungs, consistent with metastatic disease. Disc levels: Multilevel degenerative disc bulging seen throughout the thoracic spine extending from C5-6 through the visualized upper lumbar spine. Superimposed multilevel facet arthropathy. No other high-grade spinal stenosis. Neural foramina otherwise appear to be largely patent. IMPRESSION: 1. Motion degraded exam. 2. 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 Epidural extension with tumor extending into the ventral epidural space at these levels, resulting in up to moderate spinal stenosis and cord flattening. No visible cord signal changes on this motion degraded exam. 3. Additional metastatic lesion involving the T6 vertebral body with associated pathologic fracture and up to 50% height loss. Small volume ventral epidural extension of tumor with no more than mild spinal stenosis at this level. 4. No evidence for metastatic disease within the cervical spine. 5. Innumerable pulmonary nodules throughout the visualized lungs, consistent with  metastatic disease. 6. Late subacute to chronic compression fracture of the superior endplate of D66 with mild 30% height loss and 3 mm bony retropulsion. Multiple additional chronic compression fractures as above. 7. Small to moderate bilateral pleural effusions, greater on the right. 8. 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 No other high-grade spinal stenosis or impingement. Electronically Signed   By: Jeannine Boga M.D.   On: 08/06/2021 19:22   MR THORACIC SPINE W WO CONTRAST  Result Date: 08/06/2021 CLINICAL DATA:  Follow-up examination for metastatic disease. EXAM: MRI CERVICAL AND THORACIC SPINE WITHOUT AND WITH CONTRAST TECHNIQUE: Multiplanar and multiecho pulse sequences of the cervical spine, to include the craniocervical junction and cervicothoracic junction, and the thoracic spine, were obtained without and with intravenous contrast. CONTRAST:  64mL GADAVIST GADOBUTROL 1 MMOL/ML IV SOLN COMPARISON:  Prior CT from earlier the same day as well as previous MRI from 05/30/2020. FINDINGS: MRI CERVICAL SPINE FINDINGS Alignment: Examination moderately to severely degraded by motion artifact. Vertebral bodies normally aligned with preservation of the normal cervical lordosis. No listhesis. Vertebrae: Metastatic disease involving the upper thoracic spine noted, discussed on corresponding thoracic portion of this exam. Otherwise, signal intensity within the visualized bone marrow of the cervical spine is within normal limits. No metastatic involvement within  the cervical spine itself. No other discrete or worrisome osseous lesions. No other abnormal marrow edema or enhancement. Vertebral body height maintained without acute or chronic fracture. Cord: Signal intensity within the cervical spinal cord is grossly within normal limits. No appreciable cord signal abnormality or abnormal enhancement on this motion degraded exam. Posterior  Fossa, vertebral arteries, paraspinal tissues: Probable chronic microvascular ischemic disease noted within the partially visualized pons and brainstem. Visualized brain and posterior fossa otherwise unremarkable. Craniocervical junction within normal limits. Paraspinous soft tissues within normal limits. Normal flow voids seen within the vertebral arteries bilaterally. Disc levels: Mild for age degenerative disc desiccation with minimal disc bulging throughout the cervical spine. No significant spinal stenosis or frank cord impingement. Foramina appear grossly patent. MRI THORACIC SPINE FINDINGS Alignment: Examination moderately to severely degraded by motion artifact. Vertebral bodies normally aligned with preservation of the normal thoracic kyphosis. Trace anterolisthesis of T9 on T10, with trace retrolisthesis of T10 on T11 noted. Vertebrae: Abnormal T1 hypointense, stir hyperintense enhancing lesions seen involving the T1 and T2 vertebral bodies, consistent with metastatic disease. Associated mild height loss at these levels consistent with associated pathologic fractures, most pronounced at the T2 level where height loss measures up to approximately 30-40%. Epidural extension with tumor seen involving the right greater than left ventral epidural space at this level (series 21, image 14). Mild flattening and mass effect on the thoracic cord posteriorly with up to moderate spinal stenosis. No visible cord signal changes on this motion degraded exam. Lateral extension to involve the T1-2 neural foramina bilaterally which are largely obliterated. Additional extension into the left greater than right T2-3 neural foramina with mild to moderate bilateral foraminal stenosis. Additional extension to involve the posterior elements including the bilateral pedicles and posterior facets noted as well. Additional metastatic lesion involving the T6 vertebral body seen as well. Associated pathologic compression fracture with  up to 50% height loss. Epidural extension of tumor with small volume tumor seen extending into the ventral epidural space (series 21, image 12). No more than mild spinal stenosis at this level. No other metastatic disease seen within the thoracic spine. Late subacute to chronic compression fracture involving the superior endplate of Y24 with mild 30% height loss and 3 mm bony retropulsion noted. Multiple additional chronic compression fractures noted elsewhere throughout the thoracic spine, involving the T3, T5, T7, T8, and T10 vertebral bodies. Additional chronic compression fractures involving the upper lumbar spine at L1 through L3 noted. Sequelae of prior vertebral augmentation present at T7, T8, and T10. Cord: Signal intensity within the thoracic spinal cord is within normal limits. No appreciable cord signal changes on this motion degraded exam. Paraspinal and other soft tissues: Moderate right with small left layering pleural effusions. Innumerable pulmonary nodules seen throughout the visualized lungs, consistent with metastatic disease. Disc levels: Multilevel degenerative disc bulging seen throughout the thoracic spine extending from C5-6 through the visualized upper lumbar spine. Superimposed multilevel facet arthropathy. No other high-grade spinal stenosis. Neural foramina otherwise appear to be largely patent. IMPRESSION: 1. Motion degraded exam. 2. 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 Epidural extension with tumor extending into the ventral epidural space at these levels, resulting in up to moderate spinal stenosis and cord flattening. No visible cord signal changes on this motion degraded exam. 3. Additional metastatic lesion involving the T6 vertebral body with associated pathologic fracture and up to 50% height loss. Small volume ventral epidural extension of tumor with no  more than mild spinal stenosis at this level. 4. No  evidence for metastatic disease within the cervical spine. 5. Innumerable pulmonary nodules throughout the visualized lungs, consistent with metastatic disease. 6. Late subacute to chronic compression fracture of the superior endplate of I37 with mild 30% height loss and 3 mm bony retropulsion. Multiple additional chronic compression fractures as above. 7. Small to moderate bilateral pleural effusions, greater on the right. 8. 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 No other high-grade spinal stenosis or impingement. Electronically Signed   By: Jeannine Boga M.D.   On: 08/06/2021 19:22   DG Femur Min 2 Views Left  Result Date: 08/06/2021 CLINICAL DATA:  Left lower extremity muscle spasms. EXAM: LEFT FEMUR 2 VIEWS COMPARISON:  None. FINDINGS: There is no evidence of fracture or other focal bone lesions. A small joint effusion is seen within the left knee. IMPRESSION: 1. No acute osseous abnormality. 2. Small left knee joint effusion. Electronically Signed   By: Virgina Norfolk M.D.   On: 08/06/2021 20:23   US Abdomen Limited RUQ (LIVER/GB)  Result Date: 08/06/2021 CLINICAL DATA:  Dilated CBD EXAM: ULTRASOUND ABDOMEN LIMITED RIGHT UPPER QUADRANT COMPARISON:  CT 08/06/2021 FINDINGS: Gallbladder: Sludge and stones within the gallbladder. Normal wall thickness. No sonographic Percell Miller indicated but pain medication given per history form. Common bile duct: Diameter: 15.5 mm Liver: Liver slightly echogenic. No focal hepatic abnormality. Portal vein is patent on color Doppler imaging with normal direction of blood flow towards the liver. Other: None. IMPRESSION: 1. Gallstones without sonographic evidence for acute cholecystitis 2. Dilated common bile duct up to 15.5 mm. Consider correlation with MRCP to assess for ductal obstruction. 3. Slightly echogenic liver suggesting steatosis Electronically Signed   By: Donavan Foil M.D.   On:  08/06/2021 22:31     The results of significant diagnostics from this hospitalization (including imaging, microbiology, ancillary and laboratory) are listed below for reference.     Microbiology: Recent Results (from the past 240 hour(s))  Resp Panel by RT-PCR (Flu A&B, Covid) Nasopharyngeal Swab     Status: None   Collection Time: 08/06/21  4:06 PM   Specimen: Nasopharyngeal Swab; Nasopharyngeal(NP) swabs in vial transport medium  Result Value Ref Range Status   SARS Coronavirus 2 by RT PCR NEGATIVE NEGATIVE Final    Comment: (NOTE) SARS-CoV-2 target nucleic acids are NOT DETECTED.  The SARS-CoV-2 RNA is generally detectable in upper respiratory specimens during the acute phase of infection. The lowest concentration of SARS-CoV-2 viral copies this assay can detect is 138 copies/mL. A negative result does not preclude SARS-Cov-2 infection and should not be used as the sole basis for treatment or other patient management decisions. A negative result may occur with  improper specimen collection/handling, submission of specimen other than nasopharyngeal swab, presence of viral mutation(s) within the areas targeted by this assay, and inadequate number of viral copies(<138 copies/mL). A negative result must be combined with clinical observations, patient history, and epidemiological information. The expected result is Negative.  Fact Sheet for Patients:  EntrepreneurPulse.com.au  Fact Sheet for Healthcare Providers:  IncredibleEmployment.be  This test is no t yet approved or cleared by the [place] FDA and  has been authorized for detection and/or diagnosis of SARS-CoV-2 by FDA under an Emergency Use Authorization (EUA). This EUA will remain  in effect (meaning this test can be used) for the duration of the COVID-19 declaration under Section 564(b)(1) of the Act, 21 U.S.C.section 360bbb-3(b)(1), unless the authorization  is terminated  or  revoked sooner.       Influenza A by PCR NEGATIVE NEGATIVE Final   Influenza B by PCR NEGATIVE NEGATIVE Final    Comment: (NOTE) The Xpert Xpress SARS-CoV-2/FLU/RSV plus assay is intended as an aid in the diagnosis of influenza from Nasopharyngeal swab specimens and should not be used as a sole basis for treatment. Nasal washings and aspirates are unacceptable for Xpert Xpress SARS-CoV-2/FLU/RSV testing.  Fact Sheet for Patients: EntrepreneurPulse.com.au  Fact Sheet for Healthcare Providers: IncredibleEmployment.be  This test is not yet approved or cleared by the [place] FDA and has been authorized for detection and/or diagnosis of SARS-CoV-2 by FDA under an Emergency Use Authorization (EUA). This EUA will remain in effect (meaning this test can be used) for the duration of the COVID-19 declaration under Section 564(b)(1) of the Act, 21 U.S.C. section 360bbb-3(b)(1), unless the authorization is terminated or revoked.  Performed at [facility], [place] [address], [place], [place] [address]   MRSA Next Gen by PCR, Nasal     Status: None   Collection Time: 08/07/21 12:03 PM   Specimen: Nasal Mucosa; Nasal Swab  Result Value Ref Range Status   MRSA by PCR Next Gen NOT DETECTED NOT DETECTED Final    Comment: (NOTE) The GeneXpert MRSA Assay (FDA approved for NASAL specimens only), is one component of a comprehensive MRSA colonization surveillance program. It is not intended to diagnose MRSA infection nor to guide or monitor treatment for MRSA infections. Test performance is not FDA approved in patients less than 30 years old. Performed at [facility], [place] [address] [place], [place] [address]      Labs: BNP (last 3 results) Recent Labs    08/06/21 1630  BNP 29.5   Basic Metabolic Panel: Recent Labs  Lab 08/06/21 1630 08/07/21 0322 08/08/21 0404  NA 140 138 140  K 4.0 4.0 3.6  CL 95* 94* 97*  CO2 34* 31 33*   GLUCOSE 99 132* 118*  BUN $Re'15 14 20  'lOQ$ CREATININE 1.07* 1.04* 1.36*  CALCIUM 9.1 9.5 8.9   Liver Function Tests: Recent Labs  Lab 08/06/21 1630 08/08/21 0404  AST 19 19  ALT 15 19  ALKPHOS 90 91  BILITOT 0.5 0.2*  PROT 7.0 6.6  ALBUMIN 3.2* 3.1*   No results for input(s): LIPASE, AMYLASE in the last 168 hours. No results for input(s): AMMONIA in the last 168 hours. CBC: Recent Labs  Lab 08/06/21 1630 08/07/21 0322 08/08/21 0404  WBC 9.5 9.5 9.4  NEUTROABS 6.8  --   --   HGB 11.6* 12.3 11.6*  HCT 38.3 40.5 36.7  MCV 100.3* 99.8 96.8  PLT 269 270 256   Cardiac Enzymes: No results for input(s): CKTOTAL, CKMB, CKMBINDEX, TROPONINI in the last 168 hours. BNP: Invalid input(s): POCBNP CBG: Recent Labs  Lab 08/09/21 0310 08/09/21 0751 08/09/21 1201 08/09/21 1533 08/10/21 0743  GLUCAP 103* 104* 165* 104* 110*   D-Dimer No results for input(s): DDIMER in the last 72 hours. Hgb A1c No results for input(s): HGBA1C in the last 72 hours. Lipid Profile No results for input(s): CHOL, HDL, LDLCALC, TRIG, CHOLHDL, LDLDIRECT in the last 72 hours. Thyroid function studies No results for input(s): TSH, T4TOTAL, T3FREE, THYROIDAB in the last 72 hours.  Invalid input(s): FREET3 Anemia work up No results for input(s): VITAMINB12, FOLATE, FERRITIN, TIBC, IRON, RETICCTPCT in the last 72 hours. Urinalysis    Component Value Date/Time   COLORURINE YELLOW  08/06/2021 2025   APPEARANCEUR CLOUDY (A) 08/06/2021 2025   LABSPEC 1.015 08/06/2021 2025   PHURINE 8.0 08/06/2021 2025   GLUCOSEU NEGATIVE 08/06/2021 2025   HGBUR NEGATIVE 08/06/2021 2025   BILIRUBINUR NEGATIVE 08/06/2021 2025   KETONESUR NEGATIVE 08/06/2021 2025   PROTEINUR NEGATIVE 08/06/2021 2025   UROBILINOGEN 0.2 03/06/2015 1205   NITRITE NEGATIVE 08/06/2021 2025   LEUKOCYTESUR LARGE (A) 08/06/2021 2025   Sepsis Labs Invalid input(s): PROCALCITONIN,  WBC,  LACTICIDVEN Microbiology Recent Results (from the past  240 hour(s))  Resp Panel by RT-PCR (Flu A&B, Covid) Nasopharyngeal Swab     Status: None   Collection Time: 08/06/21  4:06 PM   Specimen: Nasopharyngeal Swab; Nasopharyngeal(NP) swabs in vial transport medium  Result Value Ref Range Status   SARS Coronavirus 2 by RT PCR NEGATIVE NEGATIVE Final    Comment: (NOTE) SARS-CoV-2 target nucleic acids are NOT DETECTED.  The SARS-CoV-2 RNA is generally detectable in upper respiratory specimens during the acute phase of infection. The lowest concentration of SARS-CoV-2 viral copies this assay can detect is 138 copies/mL. A negative result does not preclude SARS-Cov-2 infection and should not be used as the sole basis for treatment or other patient management decisions. A negative result may occur with  improper specimen collection/handling, submission of specimen other than nasopharyngeal swab, presence of viral mutation(s) within the areas targeted by this assay, and inadequate number of viral copies(<138 copies/mL). A negative result must be combined with clinical observations, patient history, and epidemiological information. The expected result is Negative.  Fact Sheet for Patients:  EntrepreneurPulse.com.au  Fact Sheet for Healthcare Providers:  IncredibleEmployment.be  This test is no t yet approved or cleared by the [place] FDA and  has been authorized for detection and/or diagnosis of SARS-CoV-2 by FDA under an Emergency Use Authorization (EUA). This EUA will remain  in effect (meaning this test can be used) for the duration of the COVID-19 declaration under Section 564(b)(1) of the Act, 21 U.S.C.section 360bbb-3(b)(1), unless the authorization is terminated  or revoked sooner.       Influenza A by PCR NEGATIVE NEGATIVE Final   Influenza B by PCR NEGATIVE NEGATIVE Final    Comment: (NOTE) The Xpert Xpress SARS-CoV-2/FLU/RSV plus assay is intended as an aid in the diagnosis of influenza  from Nasopharyngeal swab specimens and should not be used as a sole basis for treatment. Nasal washings and aspirates are unacceptable for Xpert Xpress SARS-CoV-2/FLU/RSV testing.  Fact Sheet for Patients: EntrepreneurPulse.com.au  Fact Sheet for Healthcare Providers: IncredibleEmployment.be  This test is not yet approved or cleared by the [place] FDA and has been authorized for detection and/or diagnosis of SARS-CoV-2 by FDA under an Emergency Use Authorization (EUA). This EUA will remain in effect (meaning this test can be used) for the duration of the COVID-19 declaration under Section 564(b)(1) of the Act, 21 U.S.C. section 360bbb-3(b)(1), unless the authorization is terminated or revoked.  Performed at [facility], [place] [address], [place], [place] [address]   MRSA Next Gen by PCR, Nasal     Status: None   Collection Time: 08/07/21 12:03 PM   Specimen: Nasal Mucosa; Nasal Swab  Result Value Ref Range Status   MRSA by PCR Next Gen NOT DETECTED NOT DETECTED Final    Comment: (NOTE) The GeneXpert MRSA Assay (FDA approved for NASAL specimens only), is one component of a comprehensive MRSA colonization surveillance program. It is not intended to diagnose MRSA infection nor to guide or monitor treatment  for MRSA infections. Test performance is not FDA approved in patients less than 63 years old. Performed at [facility], [place] [address] [place], [place] [address]        SIGNED:   Debbe Odea, MD  [facility] 08/10/2021, 8:43 AM

## 2021-08-14 ENCOUNTER — Telehealth: Payer: Self-pay | Admitting: Pulmonary Disease

## 2021-08-14 DIAGNOSIS — J9611 Chronic respiratory failure with hypoxia: Secondary | ICD-10-CM

## 2021-08-14 DIAGNOSIS — J438 Other emphysema: Secondary | ICD-10-CM

## 2021-08-14 NOTE — Telephone Encounter (Signed)
Called patient and she stated while in the hospital she had humidification on her cpap and now that she is back home she is wanting to know if she could get an order for her to get it for her cpap at home.   Dr Valeta Harms please advise

## 2021-08-15 NOTE — Telephone Encounter (Signed)
Adding humidification will only be possible if the machine is built that way Some machines do not have that.  If machine is over 67 years old, can get a new device with humidification.  She will require a study, I had requested for a Bipap titration when I saw her in 2020 and I haven't seen her since then.   Will be glad to see her in follow up

## 2021-08-15 NOTE — Telephone Encounter (Signed)
Called and spoke with pt letting her know the info per AO. Pt said that she does not wear either BIPAP or CPAP due to nto able to tolerate them as her nasal passages swell up if she wears them. When pt was asking for humidification, she was needing this for her O2 concentrator. Pt said when she was at the hospital last, pt was needing to have a humidifier hooked up to the O2 while at the hospital, not for a CPAP/BIPAP.  Dr. Valeta Harms, please advise if you are okay for [place] to send an order to DME for pt to have a humidifier provided for pt's O2 concentrator. Thanks!

## 2021-08-16 NOTE — Telephone Encounter (Signed)
Order for pt to receive humidifier for her O2 concentrator has been placed. Attempted to call pt to let her know this had been done but unable to reach. Left pt a detailed message letting her know this was done. Nothing further needed.

## 2021-08-16 NOTE — Addendum Note (Signed)
Addended by: Lorretta Harp on: 08/16/2021 08:59 AM   Modules accepted: Orders

## 2021-08-21 ENCOUNTER — Other Ambulatory Visit: Payer: Self-pay | Admitting: *Deleted

## 2021-08-21 MED ORDER — BREZTRI AEROSPHERE 160-9-4.8 MCG/ACT IN AERO: 2.0000 | INHALATION_SPRAY | Freq: Two times a day (BID) | RESPIRATORY_TRACT | 3 refills | Status: AC

## 2021-08-24 ENCOUNTER — Emergency Department (HOSPITAL_COMMUNITY)
Admission: EM | Admit: 2021-08-24 | Discharge: 2021-08-25 | Disposition: A | Discharge status: Home / Self Care | Payer: Medicare Other | Attending: Emergency Medicine | Admitting: Emergency Medicine

## 2021-08-24 DIAGNOSIS — R0602 Shortness of breath: Secondary | ICD-10-CM | POA: Diagnosis present

## 2021-08-24 DIAGNOSIS — Z7951 Long term (current) use of inhaled steroids: Secondary | ICD-10-CM | POA: Insufficient documentation

## 2021-08-24 DIAGNOSIS — Z79899 Other long term (current) drug therapy: Secondary | ICD-10-CM | POA: Insufficient documentation

## 2021-08-24 DIAGNOSIS — R079 Chest pain, unspecified: Secondary | ICD-10-CM | POA: Insufficient documentation

## 2021-08-24 DIAGNOSIS — E119 Type 2 diabetes mellitus without complications: Secondary | ICD-10-CM | POA: Diagnosis not present

## 2021-08-24 DIAGNOSIS — N189 Chronic kidney disease, unspecified: Secondary | ICD-10-CM | POA: Diagnosis not present

## 2021-08-24 DIAGNOSIS — G893 Neoplasm related pain (acute) (chronic): Secondary | ICD-10-CM | POA: Diagnosis not present

## 2021-08-24 DIAGNOSIS — Z85850 Personal history of malignant neoplasm of thyroid: Secondary | ICD-10-CM | POA: Diagnosis not present

## 2021-08-24 DIAGNOSIS — J449 Chronic obstructive pulmonary disease, unspecified: Secondary | ICD-10-CM | POA: Diagnosis not present

## 2021-08-24 DIAGNOSIS — N289 Disorder of kidney and ureter, unspecified: Secondary | ICD-10-CM

## 2021-08-24 HISTORY — DX: Malignant (primary) neoplasm, unspecified: C80.1

## 2021-08-24 NOTE — ED Triage Notes (Signed)
Pt BIB [facility] from home for [facility] and upper R sided CP since today; Pt has h/x cancer with mets.  130/90 84 HR 97% 4L

## 2021-08-25 ENCOUNTER — Other Ambulatory Visit: Payer: Self-pay

## 2021-08-25 ENCOUNTER — Encounter (HOSPITAL_COMMUNITY): Payer: Self-pay

## 2021-08-25 ENCOUNTER — Emergency Department (HOSPITAL_COMMUNITY): Payer: Medicare Other

## 2021-08-25 DIAGNOSIS — N189 Chronic kidney disease, unspecified: Secondary | ICD-10-CM | POA: Diagnosis not present

## 2021-08-25 LAB — BASIC METABOLIC PANEL
Anion gap: 17 — ABNORMAL HIGH (ref 5–15)
BUN: 11 mg/dL (ref 8–23)
CO2: 20 mmol/L — ABNORMAL LOW (ref 22–32)
Calcium: 9 mg/dL (ref 8.9–10.3)
Chloride: 99 mmol/L (ref 98–111)
Creatinine, Ser: 1.2 mg/dL — ABNORMAL HIGH (ref 0.44–1.00)
GFR, Estimated: 50 mL/min — ABNORMAL LOW (ref >60)
Glucose, Bld: 73 mg/dL (ref 70–99)
Potassium: 3.8 mmol/L (ref 3.5–5.1)
Sodium: 136 mmol/L (ref 135–145)

## 2021-08-25 LAB — CBC WITH DIFFERENTIAL/PLATELET
Abs Immature Granulocytes: 0.06 10*3/uL (ref 0.00–0.07)
Basophils Absolute: 0 10*3/uL (ref 0.0–0.1)
Basophils Relative: 0 %
Eosinophils Absolute: 0.1 10*3/uL (ref 0.0–0.5)
Eosinophils Relative: 1 %
HCT: 39.8 % (ref 36.0–46.0)
Hemoglobin: 12.3 g/dL (ref 12.0–15.0)
Immature Granulocytes: 1 %
Lymphocytes Relative: 13 %
Lymphs Abs: 1.4 10*3/uL (ref 0.7–4.0)
MCH: 31 pg (ref 26.0–34.0)
MCHC: 30.9 g/dL (ref 30.0–36.0)
MCV: 100.3 fL — ABNORMAL HIGH (ref 80.0–100.0)
Monocytes Absolute: 0.9 10*3/uL (ref 0.1–1.0)
Monocytes Relative: 8 %
Neutro Abs: 8.7 10*3/uL — ABNORMAL HIGH (ref 1.7–7.7)
Neutrophils Relative %: 77 %
Platelets: 281 10*3/uL (ref 150–400)
RBC: 3.97 MIL/uL (ref 3.87–5.11)
RDW: 16 % — ABNORMAL HIGH (ref 11.5–15.5)
WBC: 11.2 10*3/uL — ABNORMAL HIGH (ref 4.0–10.5)
nRBC: 0 % (ref 0.0–0.2)

## 2021-08-25 LAB — TROPONIN I (HIGH SENSITIVITY): Troponin I (High Sensitivity): 5 ng/L (ref <18)

## 2021-08-25 MED ORDER — HYDROMORPHONE HCL 2 MG PO TABS: 2.0000 mg | ORAL_TABLET | ORAL | 0 refills | Status: AC | PRN

## 2021-08-25 MED ORDER — HYDROMORPHONE HCL 1 MG/ML IJ SOLN
1.0000 mg | Freq: Once | INTRAMUSCULAR | Status: AC
  Administered 2021-08-25: 1 mg via INTRAVENOUS
  Filled 2021-08-25: qty 1

## 2021-08-25 MED ORDER — ONDANSETRON HCL 4 MG/2ML IJ SOLN
4.0000 mg | Freq: Once | INTRAMUSCULAR | Status: AC
  Administered 2021-08-25: 4 mg via INTRAVENOUS
  Filled 2021-08-25: qty 2

## 2021-08-25 NOTE — Discharge Instructions (Signed)
Please continue to work to get hospice care set up.  Return to the emergency department if you are having any problems.

## 2021-08-25 NOTE — ED Provider Notes (Signed)
[facility] EMERGENCY DEPARTMENT Provider Note   CSN: [id number] Arrival date & time: 08/24/21  2357     History  Chief Complaint  Patient presents with   Chest Pain   Weakness   Shortness of Breath    Theresa Fernandez is a 66 y.o. female.  The history is provided by the patient and the spouse.  Chest Pain Associated symptoms: shortness of breath and weakness   Weakness Associated symptoms: chest pain and shortness of breath   Shortness of Breath Associated symptoms: chest pain   She has history of hypertension, diabetes, hyperlipidemia, COPD with chronic home oxygen use, heart failure, metastatic parathyroid cancer and comes in because of right upper chest pain and shortness of breath which started this afternoon.  She has been taking her oxycodone-acetaminophen 10/325 without relief of pain.  She is in the process of getting started with home hospice care.  She denies fever, chills, sweats.  She denies any cough.  She denies any nausea or vomiting.   Home Medications Prior to Admission medications   Medication Sig Start Date End Date Taking? Authorizing Provider  albuterol (ACCUNEB) 0.63 MG/3ML nebulizer solution Take 3 mLs (0.63 mg total) by nebulization every 4 (four) hours as needed for wheezing or shortness of breath. 07/17/20   Icard, Octavio Graves, DO  albuterol (VENTOLIN HFA) 108 (90 Base) MCG/ACT inhaler Inhale 2 puffs into the lungs every 6 (six) hours as needed for wheezing or shortness of breath (wheezing). 07/17/20   Icard, Octavio Graves, DO  baclofen (LIORESAL) 10 MG tablet Take 10 mg by mouth 3 (three) times daily as needed for muscle spasms. 07/28/21   [provider]  Budeson-Glycopyrrol-Formoterol (BREZTRI AEROSPHERE) 160-9-4.8 MCG/ACT AERO Inhale 2 puffs into the lungs in the morning and at bedtime. 08/21/21   Icard, Octavio Graves, DO  busPIRone (BUSPAR) 15 MG tablet Take 30 mg by mouth 2 (two) times daily.    [provider]  diazepam  (VALIUM) 5 MG tablet Take 1 tablet (5 mg total) by mouth every 8 (eight) hours as needed for muscle spasms or anxiety. 08/10/21   Debbe Odea, MD  furosemide (LASIX) 40 MG tablet Take 1 tablet (40 mg total) by mouth 2 (two) times daily. Patient taking differently: Take 40 mg by mouth daily as needed for fluid or edema. 10/19/18   [name], MD  hydrOXYzine (ATARAX/VISTARIL) 25 MG tablet Take 25 mg by mouth daily. 04/21/18   [provider]  lamoTRIgine (LAMICTAL) 25 MG tablet Take 25 mg by mouth at bedtime.  04/21/18   [provider]  lovastatin (MEVACOR) 40 MG tablet Take 80 mg by mouth daily. 03/18/20   [provider]  naloxone [facility]) nasal spray 4 mg/0.1 mL Place 4 mg into the nose as needed for opioid reversal. 04/07/21   [provider]  oxyCODONE-acetaminophen (PERCOCET) 10-325 MG tablet Take 1 tablet by mouth See admin instructions. Every 4 to 6 hours as needed for pain 08/01/21   [provider]  pantoprazole (PROTONIX) 40 MG tablet Take 1 tablet (40 mg total) by mouth 2 (two) times daily. 08/01/20 09/29/21  Swayze, Ava, DO  pioglitazone (ACTOS) 15 MG tablet Take 15 mg by mouth daily. 09/13/19   [provider]  prazosin (MINIPRESS) 5 MG capsule Take 5 mg by mouth at bedtime. 12/04/19   [provider]  pregabalin (LYRICA) 150 MG capsule Take 1 capsule by mouth 2 (two) times daily. 08/15/20   [provider]  sertraline (ZOLOFT) 100 MG tablet Take 200 mg by mouth in the morning and at bedtime.    [provider]  valACYclovir (VALTREX) 1000 MG tablet Take 1,000 mg by mouth 2 (two) times daily. 07/28/21   [provider]  budesonide-formoterol (SYMBICORT) 80-4.5 MCG/ACT inhaler Inhale 2 puffs into the lungs 2 (two) times daily. Patient not taking: No sig reported 07/20/18 11/23/19  Martyn Ehrich, NP      Allergies    Ceftriaxone, Topamax [topiramate], Capsaicin, and Voltaren [diclofenac sodium]     Review of Systems   Review of Systems  Respiratory:  Positive for shortness of breath.   Cardiovascular:  Positive for chest pain.  Neurological:  Positive for weakness.  All other systems reviewed and are negative.  Physical Exam Updated Vital Signs BP (!) 117/55 (BP Location: Right Arm)    Pulse 77    Temp 97.6 F (36.4 C) (Oral)    Resp (!) 29    Ht 5\' 6"  (1.676 m)    Wt 118.3 kg    SpO2 99%    BMI 42.09 kg/m  Physical Exam Vitals and nursing note reviewed.  66 year old female, resting comfortably and in no acute distress. Vital signs are significant for elevated respiratory rate. Oxygen saturation is 99%, which is normal. Head is normocephalic and atraumatic. PERRLA, EOMI. Oropharynx is clear. Neck is nontender and supple without adenopathy or JVD. Back is nontender and there is no CVA tenderness. Lungs are clear without rales, wheezes, or rhonchi. Chest is mildly tender over the right upper anterior chest wall.  There is no crepitus. Heart has regular rate and rhythm without murmur. Abdomen is soft, flat, nontender. Extremities have no cyanosis or edema, full range of motion is present. Skin is warm and dry without rash. Neurologic: Mental status is normal, cranial nerves are intact, moves all extremities equally.  ED Results / Procedures / Treatments   Labs (all labs ordered are listed, but only abnormal results are displayed) Labs Reviewed  BASIC METABOLIC PANEL - Abnormal; Notable for the following components:      Result Value   CO2 20 (*)    Creatinine, Ser 1.20 (*)    GFR, Estimated 50 (*)    Anion gap 17 (*)    All other components within normal limits  CBC WITH DIFFERENTIAL/PLATELET - Abnormal; Notable for the following components:   WBC 11.2 (*)    MCV 100.3 (*)    RDW 16.0 (*)    Neutro Abs 8.7 (*)    All other components within normal limits  TROPONIN I (HIGH SENSITIVITY)  TROPONIN I (HIGH SENSITIVITY)    EKG EKG  Interpretation  Date/Time:  Saturday August 25 2021 00:04:34 EST Ventricular Rate:  83 PR Interval:  148 QRS Duration: 107 QT Interval:  418 QTC Calculation: 492 R Axis:   14 Text Interpretation: Sinus rhythm Inferior infarct, old When compared with ECG of 08/06/2021, Inferior Q waves noted today Confirmed by Delora Fuel ([id number]) on 08/25/2021 12:28:45 AM  Radiology No results found.  Procedures Procedures  Cardiac monitor shows normal sinus rhythm per my interpretation.  Medications Ordered in ED Medications  HYDROmorphone (DILAUDID) injection 1 mg (1 mg Intravenous Given 08/25/21 0059)  ondansetron (ZOFRAN) injection 4 mg (4 mg Intravenous Given 08/25/21 0059)    ED Course/ Medical Decision Making/ A&P  Medical Decision Making Amount and/or Complexity of Data Reviewed Labs: ordered. Radiology: ordered.  Risk Prescription drug management.   Right-sided chest pain in patient with known metastatic parathyroid cancer.  I believe that this pain is most likely related to her cancer although need to consider possibilities of pneumonia, pulmonary embolism, pneumothorax, ACS.  ECG does show new Q waves compared with 08/06/2021, but no ST or T changes.  Old records are reviewed, and she was discharged from the hospital 08/10/2021 with pain related to her parathyroid cancer.  There was discussion at that time about possible hospice care and decision was deferred, patient has now decided to go on hospice.  Office note from oncology on 06/06/2021 detailed that her tumor is not amenable to surgical cure and she is not a candidate for chemotherapy.  Immunotherapy was offered with a 20% success rate, patient declined.  We will check chest x-ray.  She will be given hydromorphone for pain.  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  I have independently viewed the image and agree with the radiologist's interpretation.  Labs are stable renal  insufficiency which is very mild.  She got good pain relief with intravenous hydromorphone.  She feels comfortable going home with prescription for other that medication.  She is given a prescription for hydromorphone and is referred back to her primary care provider and her oncologist.  She is going to pursue hospice care.  Return precautions discussed.        Final Clinical Impression(s) / ED Diagnoses Final diagnoses:  Cancer associated pain  Renal insufficiency    Rx / DC Orders ED Discharge Orders          Ordered    HYDROmorphone (DILAUDID) 2 MG tablet  Every 4 hours PRN        08/25/21 9242              Delora Fuel, MD 68/34/19 [phone]

## 2021-09-19 DEATH — deceased
